# Patient Record
Sex: Female | Born: 1970 | Race: Black or African American | Hispanic: No | Smoking: Never smoker
Health system: Southern US, Community
[De-identification: ages and names within clinical notes are randomized; demographics above are authoritative.]

## PROBLEM LIST (undated history)

## (undated) DIAGNOSIS — F419 Anxiety disorder, unspecified: Secondary | ICD-10-CM

## (undated) DIAGNOSIS — J189 Pneumonia, unspecified organism: Secondary | ICD-10-CM

## (undated) DIAGNOSIS — M1990 Unspecified osteoarthritis, unspecified site: Secondary | ICD-10-CM

## (undated) DIAGNOSIS — G4733 Obstructive sleep apnea (adult) (pediatric): Secondary | ICD-10-CM

## (undated) DIAGNOSIS — B029 Zoster without complications: Secondary | ICD-10-CM

## (undated) DIAGNOSIS — S82891A Other fracture of right lower leg, initial encounter for closed fracture: Secondary | ICD-10-CM

## (undated) DIAGNOSIS — K469 Unspecified abdominal hernia without obstruction or gangrene: Secondary | ICD-10-CM

## (undated) DIAGNOSIS — D649 Anemia, unspecified: Secondary | ICD-10-CM

## (undated) DIAGNOSIS — IMO0001 Reserved for inherently not codable concepts without codable children: Secondary | ICD-10-CM

## (undated) DIAGNOSIS — Z950 Presence of cardiac pacemaker: Secondary | ICD-10-CM

## (undated) DIAGNOSIS — G5600 Carpal tunnel syndrome, unspecified upper limb: Secondary | ICD-10-CM

## (undated) DIAGNOSIS — M722 Plantar fascial fibromatosis: Secondary | ICD-10-CM

## (undated) DIAGNOSIS — E119 Type 2 diabetes mellitus without complications: Secondary | ICD-10-CM

## (undated) DIAGNOSIS — K219 Gastro-esophageal reflux disease without esophagitis: Secondary | ICD-10-CM

## (undated) DIAGNOSIS — I10 Essential (primary) hypertension: Secondary | ICD-10-CM

## (undated) HISTORY — PX: TUBAL LIGATION: SHX77

## (undated) HISTORY — PX: GASTRIC BYPASS: SHX52

## (undated) HISTORY — PX: LAPAROSCOPIC GASTRIC SLEEVE RESECTION: SHX5895

## (undated) HISTORY — DX: Essential (primary) hypertension: I10

## (undated) HISTORY — PX: CARPAL TUNNEL RELEASE: SHX101

---

## 1997-07-04 ENCOUNTER — Emergency Department (HOSPITAL_COMMUNITY): Admission: EM | Admit: 1997-07-04 | Discharge: 1997-07-04 | Payer: Self-pay | Admitting: Emergency Medicine

## 1997-07-05 ENCOUNTER — Emergency Department (HOSPITAL_COMMUNITY): Admission: EM | Admit: 1997-07-05 | Discharge: 1997-07-06 | Payer: Self-pay | Admitting: Emergency Medicine

## 1997-09-26 ENCOUNTER — Emergency Department (HOSPITAL_COMMUNITY): Admission: EM | Admit: 1997-09-26 | Discharge: 1997-09-26 | Payer: Self-pay | Admitting: Emergency Medicine

## 1997-11-26 ENCOUNTER — Emergency Department (HOSPITAL_COMMUNITY): Admission: EM | Admit: 1997-11-26 | Discharge: 1997-11-26 | Payer: Self-pay | Admitting: Emergency Medicine

## 1998-02-18 ENCOUNTER — Emergency Department (HOSPITAL_COMMUNITY): Admission: EM | Admit: 1998-02-18 | Discharge: 1998-02-18 | Payer: Self-pay | Admitting: Emergency Medicine

## 1998-05-06 ENCOUNTER — Emergency Department (HOSPITAL_COMMUNITY): Admission: EM | Admit: 1998-05-06 | Discharge: 1998-05-06 | Payer: Self-pay | Admitting: Internal Medicine

## 1998-07-25 ENCOUNTER — Encounter: Admission: RE | Admit: 1998-07-25 | Discharge: 1998-08-14 | Payer: Self-pay | Admitting: Family Medicine

## 1999-01-19 DIAGNOSIS — S82891A Other fracture of right lower leg, initial encounter for closed fracture: Secondary | ICD-10-CM

## 1999-01-19 HISTORY — DX: Other fracture of right lower leg, initial encounter for closed fracture: S82.891A

## 1999-10-07 ENCOUNTER — Emergency Department (HOSPITAL_COMMUNITY): Admission: EM | Admit: 1999-10-07 | Discharge: 1999-10-07 | Payer: Self-pay | Admitting: Emergency Medicine

## 1999-11-09 ENCOUNTER — Emergency Department (HOSPITAL_COMMUNITY): Admission: EM | Admit: 1999-11-09 | Discharge: 1999-11-09 | Payer: Self-pay | Admitting: Emergency Medicine

## 2000-04-11 ENCOUNTER — Encounter: Payer: Self-pay | Admitting: Internal Medicine

## 2000-04-11 ENCOUNTER — Emergency Department (HOSPITAL_COMMUNITY): Admission: EM | Admit: 2000-04-11 | Discharge: 2000-04-11 | Payer: Self-pay | Admitting: Emergency Medicine

## 2000-11-02 ENCOUNTER — Emergency Department (HOSPITAL_COMMUNITY): Admission: EM | Admit: 2000-11-02 | Discharge: 2000-11-02 | Payer: Self-pay | Admitting: Emergency Medicine

## 2000-11-02 ENCOUNTER — Encounter: Payer: Self-pay | Admitting: Emergency Medicine

## 2001-02-24 ENCOUNTER — Emergency Department (HOSPITAL_COMMUNITY): Admission: EM | Admit: 2001-02-24 | Discharge: 2001-02-25 | Payer: Self-pay | Admitting: Emergency Medicine

## 2001-02-25 ENCOUNTER — Encounter: Payer: Self-pay | Admitting: Emergency Medicine

## 2001-05-31 ENCOUNTER — Emergency Department (HOSPITAL_COMMUNITY): Admission: EM | Admit: 2001-05-31 | Discharge: 2001-06-01 | Payer: Self-pay | Admitting: *Deleted

## 2002-02-15 ENCOUNTER — Emergency Department (HOSPITAL_COMMUNITY): Admission: EM | Admit: 2002-02-15 | Discharge: 2002-02-15 | Payer: Self-pay | Admitting: Emergency Medicine

## 2002-07-30 ENCOUNTER — Emergency Department (HOSPITAL_COMMUNITY): Admission: EM | Admit: 2002-07-30 | Discharge: 2002-07-30 | Payer: Self-pay | Admitting: Emergency Medicine

## 2002-07-30 ENCOUNTER — Encounter: Payer: Self-pay | Admitting: Emergency Medicine

## 2002-10-15 ENCOUNTER — Emergency Department (HOSPITAL_COMMUNITY): Admission: EM | Admit: 2002-10-15 | Discharge: 2002-10-15 | Payer: Self-pay | Admitting: Emergency Medicine

## 2003-03-20 ENCOUNTER — Emergency Department (HOSPITAL_COMMUNITY): Admission: EM | Admit: 2003-03-20 | Discharge: 2003-03-21 | Payer: Self-pay | Admitting: Emergency Medicine

## 2003-03-25 ENCOUNTER — Emergency Department (HOSPITAL_COMMUNITY): Admission: EM | Admit: 2003-03-25 | Discharge: 2003-03-25 | Payer: Self-pay

## 2003-08-03 ENCOUNTER — Emergency Department (HOSPITAL_COMMUNITY): Admission: EM | Admit: 2003-08-03 | Discharge: 2003-08-03 | Payer: Self-pay | Admitting: *Deleted

## 2003-08-26 ENCOUNTER — Emergency Department (HOSPITAL_COMMUNITY): Admission: EM | Admit: 2003-08-26 | Discharge: 2003-08-26 | Payer: Self-pay | Admitting: Emergency Medicine

## 2003-09-16 ENCOUNTER — Emergency Department (HOSPITAL_COMMUNITY): Admission: EM | Admit: 2003-09-16 | Discharge: 2003-09-16 | Payer: Self-pay | Admitting: Emergency Medicine

## 2004-05-22 ENCOUNTER — Emergency Department (HOSPITAL_COMMUNITY): Admission: EM | Admit: 2004-05-22 | Discharge: 2004-05-22 | Payer: Self-pay | Admitting: Emergency Medicine

## 2004-10-16 ENCOUNTER — Emergency Department (HOSPITAL_COMMUNITY): Admission: EM | Admit: 2004-10-16 | Discharge: 2004-10-16 | Payer: Self-pay | Admitting: Emergency Medicine

## 2004-10-20 ENCOUNTER — Emergency Department (HOSPITAL_COMMUNITY): Admission: EM | Admit: 2004-10-20 | Discharge: 2004-10-20 | Payer: Self-pay | Admitting: Emergency Medicine

## 2004-11-24 ENCOUNTER — Emergency Department (HOSPITAL_COMMUNITY): Admission: EM | Admit: 2004-11-24 | Discharge: 2004-11-24 | Payer: Self-pay | Admitting: Emergency Medicine

## 2005-01-28 ENCOUNTER — Emergency Department (HOSPITAL_COMMUNITY): Admission: EM | Admit: 2005-01-28 | Discharge: 2005-01-28 | Payer: Self-pay | Admitting: Emergency Medicine

## 2005-02-17 ENCOUNTER — Emergency Department (HOSPITAL_COMMUNITY): Admission: EM | Admit: 2005-02-17 | Discharge: 2005-02-17 | Payer: Self-pay | Admitting: Emergency Medicine

## 2005-04-14 ENCOUNTER — Emergency Department (HOSPITAL_COMMUNITY): Admission: EM | Admit: 2005-04-14 | Discharge: 2005-04-15 | Payer: Self-pay | Admitting: Emergency Medicine

## 2005-08-30 ENCOUNTER — Emergency Department (HOSPITAL_COMMUNITY): Admission: EM | Admit: 2005-08-30 | Discharge: 2005-08-30 | Payer: Self-pay | Admitting: Emergency Medicine

## 2006-03-20 ENCOUNTER — Emergency Department (HOSPITAL_COMMUNITY): Admission: EM | Admit: 2006-03-20 | Discharge: 2006-03-20 | Payer: Self-pay | Admitting: Emergency Medicine

## 2006-07-01 ENCOUNTER — Emergency Department (HOSPITAL_COMMUNITY): Admission: EM | Admit: 2006-07-01 | Discharge: 2006-07-01 | Payer: Self-pay | Admitting: Emergency Medicine

## 2006-08-29 ENCOUNTER — Emergency Department (HOSPITAL_COMMUNITY): Admission: EM | Admit: 2006-08-29 | Discharge: 2006-08-29 | Payer: Self-pay | Admitting: Emergency Medicine

## 2007-02-08 ENCOUNTER — Emergency Department (HOSPITAL_COMMUNITY): Admission: EM | Admit: 2007-02-08 | Discharge: 2007-02-09 | Payer: Self-pay | Admitting: Emergency Medicine

## 2007-08-27 ENCOUNTER — Emergency Department (HOSPITAL_COMMUNITY): Admission: EM | Admit: 2007-08-27 | Discharge: 2007-08-27 | Payer: Self-pay | Admitting: Emergency Medicine

## 2007-10-22 ENCOUNTER — Emergency Department (HOSPITAL_COMMUNITY): Admission: EM | Admit: 2007-10-22 | Discharge: 2007-10-22 | Payer: Self-pay | Admitting: Emergency Medicine

## 2009-04-24 ENCOUNTER — Emergency Department (HOSPITAL_COMMUNITY): Admission: EM | Admit: 2009-04-24 | Discharge: 2009-04-24 | Payer: Self-pay | Admitting: Emergency Medicine

## 2009-07-14 ENCOUNTER — Emergency Department (HOSPITAL_COMMUNITY): Admission: EM | Admit: 2009-07-14 | Discharge: 2009-07-14 | Payer: Self-pay | Admitting: Emergency Medicine

## 2009-08-28 ENCOUNTER — Emergency Department (HOSPITAL_COMMUNITY): Admission: EM | Admit: 2009-08-28 | Discharge: 2009-08-28 | Payer: Self-pay | Admitting: Emergency Medicine

## 2009-08-29 ENCOUNTER — Emergency Department (HOSPITAL_COMMUNITY): Admission: EM | Admit: 2009-08-29 | Discharge: 2009-08-29 | Payer: Self-pay | Admitting: Emergency Medicine

## 2010-04-05 LAB — URINALYSIS, ROUTINE W REFLEX MICROSCOPIC
Glucose, UA: NEGATIVE mg/dL
Hgb urine dipstick: NEGATIVE
Ketones, ur: NEGATIVE mg/dL
Nitrite: NEGATIVE
Protein, ur: NEGATIVE mg/dL
pH: 6.5 (ref 5.0–8.0)

## 2010-04-05 LAB — POCT I-STAT, CHEM 8
Calcium, Ion: 1.13 mmol/L (ref 1.12–1.32)
Chloride: 109 mEq/L (ref 96–112)
HCT: 38 % (ref 36.0–46.0)
Hemoglobin: 12.9 g/dL (ref 12.0–15.0)
Sodium: 143 mEq/L (ref 135–145)

## 2010-04-05 LAB — POCT PREGNANCY, URINE: Preg Test, Ur: NEGATIVE

## 2010-04-05 LAB — GLUCOSE, CAPILLARY: Glucose-Capillary: 93 mg/dL (ref 70–99)

## 2010-04-08 LAB — POCT PREGNANCY, URINE: Preg Test, Ur: NEGATIVE

## 2011-05-29 ENCOUNTER — Emergency Department (HOSPITAL_COMMUNITY)
Admission: EM | Admit: 2011-05-29 | Discharge: 2011-05-29 | Disposition: A | Discharge status: Home / Self Care | Payer: Self-pay | Attending: Emergency Medicine | Admitting: Emergency Medicine

## 2011-05-29 ENCOUNTER — Encounter (HOSPITAL_COMMUNITY): Payer: Self-pay | Admitting: *Deleted

## 2011-05-29 DIAGNOSIS — L03113 Cellulitis of right upper limb: Secondary | ICD-10-CM

## 2011-05-29 DIAGNOSIS — Z79899 Other long term (current) drug therapy: Secondary | ICD-10-CM | POA: Insufficient documentation

## 2011-05-29 DIAGNOSIS — IMO0002 Reserved for concepts with insufficient information to code with codable children: Secondary | ICD-10-CM | POA: Insufficient documentation

## 2011-05-29 DIAGNOSIS — R51 Headache: Secondary | ICD-10-CM | POA: Insufficient documentation

## 2011-05-29 DIAGNOSIS — J45909 Unspecified asthma, uncomplicated: Secondary | ICD-10-CM | POA: Insufficient documentation

## 2011-05-29 MED ORDER — SULFAMETHOXAZOLE-TRIMETHOPRIM 800-160 MG PO TABS: 1.0000 | ORAL_TABLET | Freq: Two times a day (BID) | ORAL | Status: AC

## 2011-05-29 MED ORDER — SODIUM CHLORIDE 0.9 % IV BOLUS (SEPSIS)
1000.0000 mL | Freq: Once | INTRAVENOUS | Status: AC
  Administered 2011-05-29: 1000 mL via INTRAVENOUS

## 2011-05-29 MED ORDER — VANCOMYCIN HCL IN DEXTROSE 1-5 GM/200ML-% IV SOLN
1000.0000 mg | Freq: Once | INTRAVENOUS | Status: AC
  Administered 2011-05-29: 1000 mg via INTRAVENOUS
  Filled 2011-05-29: qty 200

## 2011-05-29 MED ORDER — IBUPROFEN 200 MG PO TABS: 400.0000 mg | ORAL_TABLET | Freq: Once | ORAL | Status: DC

## 2011-05-29 MED ORDER — ACETAMINOPHEN-CODEINE #3 300-30 MG PO TABS
1.0000 | ORAL_TABLET | Freq: Once | ORAL | Status: AC
  Administered 2011-05-29: 1 via ORAL
  Filled 2011-05-29: qty 1

## 2011-05-29 MED ORDER — ACETAMINOPHEN-CODEINE #3 300-30 MG PO TABS: 1.0000 | ORAL_TABLET | Freq: Four times a day (QID) | ORAL | Status: AC | PRN

## 2011-05-29 NOTE — ED Notes (Signed)
1st set of blood cultures drawn from left arm and held.

## 2011-05-29 NOTE — ED Notes (Signed)
Erythematous area to R forearm marked to assess for changes. Vancomycin running. Family at [facility]. Med given for headache.

## 2011-05-29 NOTE — ED Notes (Signed)
Red inflamed area to rt arm after insect bite on Wed, has small fluid filled blisters around area. Pt also states she has a headache started on Thursday am

## 2011-05-29 NOTE — Discharge Instructions (Signed)
Cellulitis Cellulitis is an infection of the skin and the tissue beneath it. The area is typically red and tender. It is caused by germs (bacteria) (usually staph or strep) that enter the body through cuts or sores. Cellulitis most commonly occurs in the arms or lower legs.  HOME CARE INSTRUCTIONS   If you are given a prescription for medications which kill germs (antibiotics), take as directed until finished.   If the infection is on the arm or leg, keep the limb elevated as able.   Use a warm cloth several times per day to relieve pain and encourage healing.   See your caregiver for recheck of the infected site as directed if problems arise.   Only take over-the-counter or prescription medicines for pain, discomfort, or fever as directed by your caregiver.  SEEK MEDICAL CARE IF:   The area of redness (inflammation) is spreading, there are red streaks coming from the infected site, or if a part of the infection begins to turn dark in color.   The joint or bone underneath the infected skin becomes painful after the skin has healed.   The infection returns in the same or another area after it seems to have gone away.   A boil or bump swells up. This may be an abscess.   New, unexplained problems such as pain or fever develop.  SEEK IMMEDIATE MEDICAL CARE IF:   You have a fever.   You or your child feels drowsy or lethargic.   There is vomiting, diarrhea, or lasting discomfort or feeling ill (malaise) with muscle aches and pains.  MAKE SURE YOU:   Understand these instructions.   Will watch your condition.   Will get help right away if you are not doing well or get worse.  Document Released: 10/14/2004 Document Revised: 12/24/2010 Document Reviewed: 08/23/2007 [facility] Patient Information 2012 [place], [place].  RESOURCE GUIDE  Dental Problems  Patients with Medicaid: [facility] [phone]                                            [phone] [address] Phone:  [phone]                                                  Phone:  [phone]  If unable to pay or uninsured, contact:  [facility] or [facility]. to become qualified for the adult dental clinic.  Chronic Pain Problems Contact [facility]  [phone] Patients need to be referred by their primary care doctor.  Insufficient Money for Medicine Contact [employer]:  call "211" or [facility] [phone].  No Primary Care Doctor Call [facility]  [phone] Other agencies that provide inexpensive medical care    [facility]  [phone]    [facility]  [phone]    [facility]  [phone]    [facility]  [phone]    [facility]  [phone]    [facility]  [phone]  Psychological Services [facility]  [phone] [facility]  [phone]  [facility]   [phone] (emergency services [phone])  Substance Abuse Resources [facility]  [phone] [facility] [phone] [facility] [place] [phone] Floydene Flock [phone] Residential & Outpatient Substance Abuse Program  [phone]  Abuse/Neglect [facility] Child Abuse Hotline 207-120-8666 [facility] Child Abuse Hotline [phone] (After Hours)  Emergency Shelter [facility] [phone]  Maternity Homes [facility] at the [place] [facility] [phone] [facility] [phone]  MRSA Hotline #:   7051876910    [facility] Resources  [facility]     [employer]                          [facility] Dept. [address] [place]                       [address]      [address]  Blondell Reveal Phone:  [phone]                                    Phone:  [phone]                 Phone:  [phone]  [facility] Phone:  [phone]  [facility] Child Abuse Hotline 9892853893 [phone] (After Hours)

## 2011-06-02 NOTE — ED Provider Notes (Signed)
History    Female with right upper extremity pain. Patient reports that she's been by an insect on Wednesday. She does not actually remember seeing an insect though, she is just presuming this. Increasing redness since. No fevers or chills. No nausea or vomiting. Denies history of diabetes. Denies lesions anywhere else. No contacts similar symptoms. No history similar symptoms. No new exposures that she is aware of. No difficulty breathing or swallowing. CSN: [id number]  Arrival date & time 05/29/11  1209   First MD Initiated Contact with Patient 05/29/11 1247      Chief Complaint  Patient presents with  . Insect Bite  . Headache    (Consider location/radiation/quality/duration/timing/severity/associated sxs/prior treatment) HPI  Past Medical History  Diagnosis Date  . Migraine   . Asthma     Past Surgical History  Procedure Date  . Tubal ligation     No family history on file.  History  Substance Use Topics  . Smoking status: Former Games developer  . Smokeless tobacco: Not on file  . Alcohol Use: No    OB History    Grav Para Term Preterm Abortions TAB SAB Ect Mult Living                  Review of Systems   Review of symptoms negative unless otherwise noted in HPI.   Allergies  Aspirin; Penicillins; Percocet; and Vicodin  Home Medications   Current Outpatient Rx  Name Route Sig Dispense Refill  . ACETAMINOPHEN 500 MG PO TABS Oral Take 1,000 mg by mouth every 6 (six) hours as needed. pain    . ALBUTEROL SULFATE HFA 108 (90 BASE) MCG/ACT IN AERS Inhalation Inhale 2 puffs into the lungs every 6 (six) hours as needed. Shortness of breath    . ACETAMINOPHEN-CODEINE #3 300-30 MG PO TABS Oral Take 1-2 tablets by mouth every 6 (six) hours as needed for pain. 15 tablet 0  . SULFAMETHOXAZOLE-TRIMETHOPRIM 800-160 MG PO TABS Oral Take 1 tablet by mouth every 12 (twelve) hours. 14 tablet 0    BP 138/81  Pulse 59  Temp(Src) 98.3 F (36.8 C) (Oral)  Resp 18  SpO2 100%   LMP 05/09/2011  Physical Exam  Nursing note and vitals reviewed. Constitutional: She appears well-developed and well-nourished. No distress.  HENT:  Head: Normocephalic and atraumatic.  Eyes: Conjunctivae are normal. Right eye exhibits no discharge. Left eye exhibits no discharge.  Neck: Neck supple.  Cardiovascular: Normal rate, regular rhythm and normal heart sounds.  Exam reveals no gallop and no friction rub.   No murmur heard. Pulmonary/Chest: Effort normal and breath sounds normal. No respiratory distress.  Abdominal: Soft. She exhibits no distension. There is no tenderness.  Musculoskeletal: She exhibits no edema and no tenderness.  Neurological: She is alert.  Skin: Skin is warm and dry.       Medial aspect of the proximal right forearm near the elbow has a erythematous patch w/ increased warmth and tenderness to palpation. Two small clear fluid-filled vesicles near central aspect. No induration. No drainage. FROM elbow without increase of pain. No effusion. No bony tenderness.  Psychiatric: She has a normal mood and affect. Her behavior is normal. Thought content normal.    ED Course  Procedures (including critical care time)  Labs Reviewed - No data to display No results found.   1. Cellulitis of right arm       MDM  41 year old female with cellulitis her right upper extremity. She is afebrile and well appearing.  Patient reports a history of bug bite, although she did not actually see an insect. Suspect cellulitis although there are small clearish fluid-filled vesicles in the middle of the erythematous area. Consider other etiology or contact dermatitis. There is no history to suggest a exposure though. Treat for possible cellulitis. Feel that patient can be treated as an outpatient this time. Return precautions were discussed. Return in 2-3 days for recheck.     Raeford Razor, MD 06/02/11 [phone]

## 2011-12-22 ENCOUNTER — Encounter (HOSPITAL_COMMUNITY): Payer: Self-pay | Admitting: Emergency Medicine

## 2011-12-22 ENCOUNTER — Emergency Department (HOSPITAL_COMMUNITY)
Admission: EM | Admit: 2011-12-22 | Discharge: 2011-12-23 | Disposition: A | Discharge status: Home / Self Care | Payer: Self-pay | Attending: Emergency Medicine | Admitting: Emergency Medicine

## 2011-12-22 DIAGNOSIS — J029 Acute pharyngitis, unspecified: Secondary | ICD-10-CM | POA: Insufficient documentation

## 2011-12-22 DIAGNOSIS — J45909 Unspecified asthma, uncomplicated: Secondary | ICD-10-CM | POA: Insufficient documentation

## 2011-12-22 DIAGNOSIS — R059 Cough, unspecified: Secondary | ICD-10-CM | POA: Insufficient documentation

## 2011-12-22 DIAGNOSIS — Z8679 Personal history of other diseases of the circulatory system: Secondary | ICD-10-CM | POA: Insufficient documentation

## 2011-12-22 DIAGNOSIS — J3489 Other specified disorders of nose and nasal sinuses: Secondary | ICD-10-CM | POA: Insufficient documentation

## 2011-12-22 DIAGNOSIS — Z87891 Personal history of nicotine dependence: Secondary | ICD-10-CM | POA: Insufficient documentation

## 2011-12-22 DIAGNOSIS — J069 Acute upper respiratory infection, unspecified: Secondary | ICD-10-CM | POA: Insufficient documentation

## 2011-12-22 DIAGNOSIS — Z79899 Other long term (current) drug therapy: Secondary | ICD-10-CM | POA: Insufficient documentation

## 2011-12-22 DIAGNOSIS — R05 Cough: Secondary | ICD-10-CM | POA: Insufficient documentation

## 2011-12-22 NOTE — ED Notes (Signed)
Patient states that for that last 2 days she has had a HA, cough with nasal congestion, runny nose. The patient reports that she feels miserable

## 2011-12-23 ENCOUNTER — Emergency Department (HOSPITAL_COMMUNITY): Payer: Self-pay

## 2011-12-23 MED ORDER — AZITHROMYCIN 250 MG PO TABS: 250.0000 mg | ORAL_TABLET | Freq: Every day | ORAL | Status: DC

## 2011-12-23 MED ORDER — PREDNISONE 20 MG PO TABS
60.0000 mg | ORAL_TABLET | Freq: Once | ORAL | Status: AC
  Administered 2011-12-23: 60 mg via ORAL
  Filled 2011-12-23: qty 3

## 2011-12-23 MED ORDER — PREDNISONE 20 MG PO TABS: 40.0000 mg | ORAL_TABLET | Freq: Every day | ORAL | Status: DC

## 2011-12-23 MED ORDER — ALBUTEROL SULFATE (5 MG/ML) 0.5% IN NEBU
2.5000 mg | INHALATION_SOLUTION | Freq: Once | RESPIRATORY_TRACT | Status: AC
  Administered 2011-12-23: 2.5 mg via RESPIRATORY_TRACT
  Filled 2011-12-23 (×2): qty 0.5

## 2011-12-23 MED ORDER — BENZONATATE 100 MG PO CAPS: 200.0000 mg | ORAL_CAPSULE | Freq: Two times a day (BID) | ORAL | Status: DC | PRN

## 2011-12-23 MED ORDER — ALBUTEROL SULFATE HFA 108 (90 BASE) MCG/ACT IN AERS: 2.0000 | INHALATION_SPRAY | RESPIRATORY_TRACT | Status: DC | PRN

## 2011-12-23 NOTE — ED Provider Notes (Signed)
History     CSN: [id number]  Arrival date & time 12/22/11  2333   First MD Initiated Contact with Patient 12/22/11 2359      Chief Complaint  Patient presents with  . Generalized Body Aches  . Cough  . Nasal Congestion    (Consider location/radiation/quality/duration/timing/severity/associated sxs/prior treatment) HPI Comments: This is a 41 year old female who presents emergency department with chief complaint of headache, cough, nasal congestion, sore throat, and diagnosed for the past 2 days. She has not taken anything to alleviate her symptoms. She has past medical history remarkable for asthma. She states that her symptoms have been worsening. Nothing makes her symptoms worse or better. She is in moderate discomfort. She reports subjective fever.   The history is provided by the patient. No language interpreter was used.    Past Medical History  Diagnosis Date  . Migraine   . Asthma     Past Surgical History  Procedure Date  . Tubal ligation     History reviewed. No pertinent family history.  History  Substance Use Topics  . Smoking status: Former Games developer  . Smokeless tobacco: Not on file  . Alcohol Use: No    OB History    Grav Para Term Preterm Abortions TAB SAB Ect Mult Living                  Review of Systems  All other systems reviewed and are negative.    Allergies  Aspirin; Penicillins; Percocet; and Vicodin  Home Medications   Current Outpatient Rx  Name  Route  Sig  Dispense  Refill  . ACETAMINOPHEN 500 MG PO TABS   Oral   Take 1,000 mg by mouth every 6 (six) hours as needed. pain         . ALBUTEROL SULFATE HFA 108 (90 BASE) MCG/ACT IN AERS   Inhalation   Inhale 2 puffs into the lungs every 6 (six) hours as needed. Shortness of breath         . ALBUTEROL SULFATE HFA 108 (90 BASE) MCG/ACT IN AERS   Inhalation   Inhale 2 puffs into the lungs every 4 (four) hours as needed for wheezing or shortness of breath.   1 Inhaler   3   .  AZITHROMYCIN 250 MG PO TABS   Oral   Take 1 tablet (250 mg total) by mouth daily. 500mg  PO day 1, then 250mg  PO days 205   6 tablet   0   . PREDNISONE 20 MG PO TABS   Oral   Take 2 tablets (40 mg total) by mouth daily.   10 tablet   0     BP 143/98  Pulse 95  Temp 99.8 F (37.7 C) (Oral)  Resp 18  SpO2 100%  LMP 10/19/2011  Physical Exam  Nursing note and vitals reviewed. Constitutional: She is oriented to person, place, and time. She appears well-developed and well-nourished.  HENT:  Head: Normocephalic and atraumatic.       Swollen boggy nasal turbinates, oropharynx red and inflamed.  Eyes: Conjunctivae normal and EOM are normal. Pupils are equal, round, and reactive to light.  Neck: Normal range of motion. Neck supple.  Cardiovascular: Normal rate and regular rhythm.  Exam reveals no gallop and no friction rub.   No murmur heard. Pulmonary/Chest: Effort normal and breath sounds normal. No respiratory distress. She has no wheezes. She has no rales. She exhibits no tenderness.  Abdominal: Soft. Bowel sounds are normal. She exhibits no  distension and no mass. There is no tenderness. There is no rebound and no guarding.  Musculoskeletal: Normal range of motion. She exhibits no edema and no tenderness.  Neurological: She is alert and oriented to person, place, and time.  Skin: Skin is warm and dry.  Psychiatric: She has a normal mood and affect. Her behavior is normal. Judgment and thought content normal.    ED Course  Procedures (including critical care time)  Labs Reviewed - No data to display Dg Chest 2 View  12/23/2011  *RADIOLOGY REPORT*  Clinical Data: Fever. Cough.  Flu symptoms.  CHEST - 2 VIEW  Comparison:  None.  Findings:  The heart size and mediastinal contours are within normal limits.  Both lungs are clear.  The visualized skeletal structures are unremarkable.  IMPRESSION: No active cardiopulmonary disease.   Original Report Authenticated By: Myles Rosenthal, M.D.       1. URI (upper respiratory infection)   2. Asthma       MDM  41 year old with cold and flulike symptoms. I'm going to give the patient was an inhaler, prednisone, and a Z-Pak the to her asthma history. Will also give tessalon perles.  Patient states that her chest hurts when she coughs.  She is tender in her intercostals on palpation.  I recommended ibuprofen as she has likely strained her muscles from coughing. Patient is stable and agrees with the plan. She is stable and ready for discharge. She is improved after breathing treatment in the ED.        Roxy Horseman, PA-C 12/23/11 [phone]

## 2011-12-24 NOTE — ED Provider Notes (Signed)
Medical screening examination/treatment/procedure(s) were performed by non-physician practitioner and as supervising physician I was immediately available for consultation/collaboration.  Aureliano Oshields, MD 12/24/11 0731 

## 2011-12-26 ENCOUNTER — Emergency Department (HOSPITAL_COMMUNITY)
Admission: EM | Admit: 2011-12-26 | Discharge: 2011-12-26 | Disposition: A | Discharge status: Home / Self Care | Payer: Self-pay | Attending: Emergency Medicine | Admitting: Emergency Medicine

## 2011-12-26 ENCOUNTER — Encounter (HOSPITAL_COMMUNITY): Payer: Self-pay

## 2011-12-26 ENCOUNTER — Emergency Department (HOSPITAL_COMMUNITY): Payer: Self-pay

## 2011-12-26 DIAGNOSIS — J111 Influenza due to unidentified influenza virus with other respiratory manifestations: Secondary | ICD-10-CM | POA: Insufficient documentation

## 2011-12-26 DIAGNOSIS — IMO0002 Reserved for concepts with insufficient information to code with codable children: Secondary | ICD-10-CM | POA: Insufficient documentation

## 2011-12-26 DIAGNOSIS — R05 Cough: Secondary | ICD-10-CM | POA: Insufficient documentation

## 2011-12-26 DIAGNOSIS — R059 Cough, unspecified: Secondary | ICD-10-CM | POA: Insufficient documentation

## 2011-12-26 DIAGNOSIS — R0682 Tachypnea, not elsewhere classified: Secondary | ICD-10-CM | POA: Insufficient documentation

## 2011-12-26 DIAGNOSIS — Z792 Long term (current) use of antibiotics: Secondary | ICD-10-CM | POA: Insufficient documentation

## 2011-12-26 DIAGNOSIS — Z87891 Personal history of nicotine dependence: Secondary | ICD-10-CM | POA: Insufficient documentation

## 2011-12-26 DIAGNOSIS — J45909 Unspecified asthma, uncomplicated: Secondary | ICD-10-CM | POA: Insufficient documentation

## 2011-12-26 DIAGNOSIS — Z8669 Personal history of other diseases of the nervous system and sense organs: Secondary | ICD-10-CM | POA: Insufficient documentation

## 2011-12-26 DIAGNOSIS — R110 Nausea: Secondary | ICD-10-CM | POA: Insufficient documentation

## 2011-12-26 DIAGNOSIS — R0609 Other forms of dyspnea: Secondary | ICD-10-CM | POA: Insufficient documentation

## 2011-12-26 DIAGNOSIS — R51 Headache: Secondary | ICD-10-CM | POA: Insufficient documentation

## 2011-12-26 DIAGNOSIS — R0989 Other specified symptoms and signs involving the circulatory and respiratory systems: Secondary | ICD-10-CM | POA: Insufficient documentation

## 2011-12-26 DIAGNOSIS — R0789 Other chest pain: Secondary | ICD-10-CM | POA: Insufficient documentation

## 2011-12-26 LAB — BASIC METABOLIC PANEL
Creatinine, Ser: 1.04 mg/dL (ref 0.50–1.10)
GFR calc Af Amer: 75 mL/min — ABNORMAL LOW (ref >90)
GFR calc non Af Amer: 65 mL/min — ABNORMAL LOW (ref >90)
Sodium: 136 mEq/L (ref 135–145)

## 2011-12-26 LAB — CBC
HCT: 35 % — ABNORMAL LOW (ref 36.0–46.0)
MCH: 27.3 pg (ref 26.0–34.0)
Platelets: 265 10*3/uL (ref 150–400)
RDW: 14.7 % (ref 11.5–15.5)
WBC: 5.2 10*3/uL (ref 4.0–10.5)

## 2011-12-26 MED ORDER — ACETAMINOPHEN 500 MG PO TABS
ORAL_TABLET | ORAL | Status: AC
  Administered 2011-12-26: 1000 mg via ORAL
  Filled 2011-12-26: qty 2

## 2011-12-26 MED ORDER — HYDROMORPHONE HCL PF 1 MG/ML IJ SOLN
0.5000 mg | Freq: Once | INTRAMUSCULAR | Status: AC
Start: 2011-12-26 — End: 2011-12-26
  Administered 2011-12-26: 0.5 mg via INTRAMUSCULAR
  Filled 2011-12-26: qty 1

## 2011-12-26 MED ORDER — OXYCODONE-ACETAMINOPHEN 5-325 MG PO TABS
1.0000 | ORAL_TABLET | Freq: Once | ORAL | Status: DC
  Filled 2011-12-26: qty 1

## 2011-12-26 MED ORDER — ALBUTEROL SULFATE (5 MG/ML) 0.5% IN NEBU
5.0000 mg | INHALATION_SOLUTION | Freq: Once | RESPIRATORY_TRACT | Status: AC
  Administered 2011-12-26: 5 mg via RESPIRATORY_TRACT
  Filled 2011-12-26: qty 1

## 2011-12-26 MED ORDER — DIPHENHYDRAMINE HCL 25 MG PO CAPS
25.0000 mg | ORAL_CAPSULE | Freq: Once | ORAL | Status: AC
  Administered 2011-12-26: 25 mg via ORAL
  Filled 2011-12-26: qty 1

## 2011-12-26 MED ORDER — ACETAMINOPHEN 500 MG PO TABS
1000.0000 mg | ORAL_TABLET | Freq: Once | ORAL | Status: AC
  Administered 2011-12-26: 1000 mg via ORAL

## 2011-12-26 MED ORDER — ACETAMINOPHEN 500 MG PO TABS
1000.0000 mg | ORAL_TABLET | Freq: Once | ORAL | Status: AC
  Administered 2011-12-26: 1000 mg via ORAL
  Filled 2011-12-26: qty 2

## 2011-12-26 MED ORDER — PREDNISONE 20 MG PO TABS
60.0000 mg | ORAL_TABLET | ORAL | Status: AC
  Administered 2011-12-26: 60 mg via ORAL
  Filled 2011-12-26: qty 3

## 2011-12-26 NOTE — ED Provider Notes (Signed)
History     CSN: [id number]  Arrival date & time 12/26/11  0545   First MD Initiated Contact with Patient 12/26/11 [phone]      Chief Complaint  Patient presents with  . Fever  . Shortness of Breath     HPI   The patient presents with dyspnea generalized discomfort, chest pain.  Symptoms began approximately one week ago, with dyspnea.  Since onset has been persistent.  Over that time she has developed chills, diffuse discomfort.  She also endorses nausea.  She denies vomiting or diarrhea.  She was seen here one week ago, discharged after symptomatic improvement with albuterol, steroids.  She notes that in the interim she has called 911 once, but has had no further ED evaluations.  Today she states that she has worsening generalized discomfort, persistent dyspnea.  Past Medical History  Diagnosis Date  . Migraine   . Asthma     Past Surgical History  Procedure Date  . Tubal ligation     History reviewed. No pertinent family history.  History  Substance Use Topics  . Smoking status: Former Games developer  . Smokeless tobacco: Not on file  . Alcohol Use: No    OB History    Grav Para Term Preterm Abortions TAB SAB Ect Mult Living                  Review of Systems  Constitutional:       Per HPI, otherwise negative  HENT:       Per HPI, otherwise negative  Eyes: Negative.   Respiratory:       Per HPI, otherwise negative  Cardiovascular:       Per HPI, otherwise negative  Gastrointestinal: Negative for vomiting.  Genitourinary: Negative.   Musculoskeletal:       Per HPI, otherwise negative  Skin: Negative.   Neurological: Negative for syncope.    Allergies  Aspirin; Penicillins; Percocet; and Vicodin  Home Medications   Current Outpatient Rx  Name  Route  Sig  Dispense  Refill  . ACETAMINOPHEN 500 MG PO TABS   Oral   Take 1,000 mg by mouth every 6 (six) hours as needed. pain         . ALBUTEROL SULFATE HFA 108 (90 BASE) MCG/ACT IN AERS   Inhalation  Inhale 2 puffs into the lungs every 6 (six) hours as needed. Shortness of breath         . AZITHROMYCIN 250 MG PO TABS   Oral   Take 1 tablet (250 mg total) by mouth daily. 500mg  PO day 1, then 250mg  PO days 205   6 tablet   0   . BENZONATATE 100 MG PO CAPS   Oral   Take 2 capsules (200 mg total) by mouth 2 (two) times daily as needed for cough.   20 capsule   0   . PREDNISONE 20 MG PO TABS   Oral   Take 2 tablets (40 mg total) by mouth daily.   10 tablet   0     BP 137/78  Pulse 88  Temp 100.6 F (38.1 C) (Oral)  Resp 21  SpO2 98%  LMP 10/19/2011  Physical Exam  Nursing note and vitals reviewed. Constitutional: She is oriented to person, place, and time. She appears well-developed and well-nourished. No distress.  HENT:  Head: Normocephalic and atraumatic.  Eyes: Conjunctivae normal and EOM are normal.  Cardiovascular: Normal rate and regular rhythm.   Pulmonary/Chest: Breath sounds normal.  No stridor. Tachypnea noted. No respiratory distress.       No sig wheezing throughout  Abdominal: She exhibits no distension.  Musculoskeletal: She exhibits no edema.  Neurological: She is alert and oriented to person, place, and time. No cranial nerve deficit.  Skin: Skin is warm and dry.  Psychiatric: She has a normal mood and affect.    ED Course  Procedures (including critical care time)   Labs Reviewed  CBC  BASIC METABOLIC PANEL   No results found.   No diagnosis found.  7:27 AM Patient's temp is down to 100.  9:20 AM Patient w persistent HA.  Otherwise she states that she feels better.  10:54 AM Patient feeling better.   I interpreted the XR.  No obvious infiltrate.   Date: 12/26/2011  Rate: 86  Rhythm: normal sinus rhythm  QRS Axis: normal  Intervals: normal  ST/T Wave abnormalities: normal  Conduction Disutrbances: none  Narrative Interpretation: unremarkable     MDM  This pleasant 41 year old female presents with concerns of ongoing  dyspnea, fever.  Notably, since the patient's recent evaluation she developed a cough, worsening headache.  On exam the patient is in neurologically unremarkable.  Following Tylenol, the patient's headache improved, fever ceased.  The patient's vital signs remained stable throughout her ED stay.  We discussed, at length, the likely diagnosis of viral syndrome, influenza-like illness.  Return precautions were provided.  Resources for finding a primary care physician for followup were provided.      Gerhard Munch, MD 12/26/11 1056

## 2011-12-26 NOTE — ED Notes (Signed)
Per pt, seen 12/5 for URI.  Called 911 last pm for difficulty breathing and rec'd breathing treatment.  Breathing has not improved.  Pt now presents with fever, nausea, cough- productive, body aches.

## 2011-12-26 NOTE — ED Notes (Signed)
Pt states that she started having a cough, SOB and fever on Monday. Went to physician on 12/4 and was given an inhaler, cough medication, and abx that she is still taking. Pt has hx of asthma. Says that she is having generalized bodyaches, and pain in her chest, abdomen and head from coughing.

## 2013-04-13 ENCOUNTER — Emergency Department (HOSPITAL_COMMUNITY)
Admission: EM | Admit: 2013-04-13 | Discharge: 2013-04-13 | Disposition: A | Discharge status: Home / Self Care | Payer: Medicaid Other | Attending: Emergency Medicine | Admitting: Emergency Medicine

## 2013-04-13 ENCOUNTER — Emergency Department (HOSPITAL_COMMUNITY): Payer: Medicaid Other

## 2013-04-13 ENCOUNTER — Encounter (HOSPITAL_COMMUNITY): Payer: Self-pay | Admitting: Emergency Medicine

## 2013-04-13 DIAGNOSIS — Z792 Long term (current) use of antibiotics: Secondary | ICD-10-CM | POA: Insufficient documentation

## 2013-04-13 DIAGNOSIS — J45909 Unspecified asthma, uncomplicated: Secondary | ICD-10-CM | POA: Insufficient documentation

## 2013-04-13 DIAGNOSIS — M7730 Calcaneal spur, unspecified foot: Secondary | ICD-10-CM | POA: Insufficient documentation

## 2013-04-13 DIAGNOSIS — M25571 Pain in right ankle and joints of right foot: Secondary | ICD-10-CM

## 2013-04-13 DIAGNOSIS — M19079 Primary osteoarthritis, unspecified ankle and foot: Secondary | ICD-10-CM | POA: Insufficient documentation

## 2013-04-13 DIAGNOSIS — Z79899 Other long term (current) drug therapy: Secondary | ICD-10-CM | POA: Insufficient documentation

## 2013-04-13 DIAGNOSIS — Z87891 Personal history of nicotine dependence: Secondary | ICD-10-CM | POA: Insufficient documentation

## 2013-04-13 DIAGNOSIS — Z880 Allergy status to penicillin: Secondary | ICD-10-CM | POA: Insufficient documentation

## 2013-04-13 DIAGNOSIS — IMO0002 Reserved for concepts with insufficient information to code with codable children: Secondary | ICD-10-CM | POA: Insufficient documentation

## 2013-04-13 DIAGNOSIS — Z8679 Personal history of other diseases of the circulatory system: Secondary | ICD-10-CM | POA: Insufficient documentation

## 2013-04-13 DIAGNOSIS — M1990 Unspecified osteoarthritis, unspecified site: Secondary | ICD-10-CM

## 2013-04-13 MED ORDER — MELOXICAM 15 MG PO TABS: 15.0000 mg | ORAL_TABLET | Freq: Every day | ORAL | Status: DC

## 2013-04-13 MED ORDER — TRAMADOL HCL 50 MG PO TABS: 50.0000 mg | ORAL_TABLET | Freq: Four times a day (QID) | ORAL | Status: DC | PRN

## 2013-04-13 NOTE — Discharge Instructions (Signed)
Your x-rays do not show any broken bones or other concerning injuries.  You do have arthritis changes as well as bone spurs which can lead to pain and inflammation.  Use Rest, Ice, Compression, and Elevation to reduce pain and swelling.  Follow up with a primary care provider or orthopedic specialist for continued evaluation and treatment.    Ankle Pain Ankle pain is a common symptom. The bones, cartilage, tendons, and muscles of the ankle joint perform a lot of work each day. The ankle joint holds your body weight and allows you to move around. Ankle pain can occur on either side or back of 1 or both ankles. Ankle pain may be sharp and burning or dull and aching. There may be tenderness, stiffness, redness, or warmth around the ankle. The pain occurs more often when a person walks or puts pressure on the ankle. CAUSES  There are many reasons ankle pain can develop. It is important to work with your caregiver to identify the cause since many conditions can impact the bones, cartilage, muscles, and tendons. Causes for ankle pain include:  Injury, including a break (fracture), sprain, or strain often due to a fall, sports, or a high-impact activity.  Swelling (inflammation) of a tendon (tendonitis).  Achilles tendon rupture.  Ankle instability after repeated sprains and strains.  Poor foot alignment.  Pressure on a nerve (tarsal tunnel syndrome).  Arthritis in the ankle or the lining of the ankle.  Crystal formation in the ankle (gout or pseudogout). DIAGNOSIS  A diagnosis is based on your medical history, your symptoms, results of your physical exam, and results of diagnostic tests. Diagnostic tests may include X-ray exams or a computerized magnetic scan (magnetic resonance imaging, MRI). TREATMENT  Treatment will depend on the cause of your ankle pain and may include:  Keeping pressure off the ankle and limiting activities.  Using crutches or other walking support (a cane or  brace).  Using rest, ice, compression, and elevation.  Participating in physical therapy or home exercises.  Wearing shoe inserts or special shoes.  Losing weight.  Taking medications to reduce pain or swelling or receiving an injection.  Undergoing surgery. HOME CARE INSTRUCTIONS   Only take over-the-counter or prescription medicines for pain, discomfort, or fever as directed by your caregiver.  Put ice on the injured area.  Put ice in a plastic bag.  Place a towel between your skin and the bag.  Leave the ice on for 15-20 minutes at a time, 03-04 times a day.  Keep your leg raised (elevated) when possible to lessen swelling.  Avoid activities that cause ankle pain.  Follow specific exercises as directed by your caregiver.  Record how often you have ankle pain, the location of the pain, and what it feels like. This information may be helpful to you and your caregiver.  Ask your caregiver about returning to work or sports and whether you should drive.  Follow up with your caregiver for further examination, therapy, or testing as directed. SEEK MEDICAL CARE IF:   Pain or swelling continues or worsens beyond 1 week.  You have an oral temperature above 102 F (38.9 C).  You are feeling unwell or have chills.  You are having an increasingly difficult time with walking.  You have loss of sensation or other new symptoms.  You have questions or concerns. MAKE SURE YOU:   Understand these instructions.  Will watch your condition.  Will get help right away if you are not doing well  or get worse. Document Released: 06/24/2009 Document Revised: 03/29/2011 Document Reviewed: 06/24/2009 [facility] Patient Information 2014 [place], [place].

## 2013-04-13 NOTE — ED Notes (Signed)
Pt states she broke her R ankle a few years ago and it didn't heal properly. States she has had swelling and pain to R ankle for the past 2 weeks. Pt also c/o pain to L heel area with pins and needles sensation. Pt reports that she has used ice, heat, compression and Tylenol with little relief. Pt ambulatory to exam room with steady gait.

## 2013-04-13 NOTE — ED Provider Notes (Signed)
CSN: [id number]     Arrival date & time 04/13/13  1913 History   First MD Initiated Contact with Patient 04/13/13 2007     Chief Complaint  Patient presents with  . Ankle Pain   HPI  Hx provided by the pt.  Pt is a 43 yo female with hx of asthma and migraines who presents with complaints of worsening rt ankle pain and swelling.  Pt reports chronic hx of rt ankle pain following fractures over 14 yrs ago.  She states they never "healed back right".  She was told by an orthopedic doctor that she would need to "have her bones re-broken and set" so they could heal correctly.  Over the past 2 week she has had increased pain and swelling.  She has used rest, ice, and elevation.  She has used an ace wrap as well as Tylenol and aleve without any improvement.  Denies any new injury or trauma.  No weakness or numbness in foot or toes.  No swelling of calf.  No chest pain or SOB.     Past Medical History  Diagnosis Date  . Migraine   . Asthma    Past Surgical History  Procedure Laterality Date  . Tubal ligation     No family history on file. History  Substance Use Topics  . Smoking status: Former Games developer  . Smokeless tobacco: Not on file  . Alcohol Use: No   OB History   Grav Para Term Preterm Abortions TAB SAB Ect Mult Living                 Review of Systems  Constitutional: Negative for fever, chills and diaphoresis.  Respiratory: Negative for shortness of breath.   Cardiovascular: Negative for chest pain.  Neurological: Negative for weakness and numbness.  All other systems reviewed and are negative.      Allergies  Aspirin; Penicillins; Percocet; and Vicodin  Home Medications   Current Outpatient Rx  Name  Route  Sig  Dispense  Refill  . acetaminophen (TYLENOL) 500 MG tablet   Oral   Take 1,000 mg by mouth every 6 (six) hours as needed. pain         . albuterol (PROVENTIL HFA;VENTOLIN HFA) 108 (90 BASE) MCG/ACT inhaler   Inhalation   Inhale 2 puffs into the lungs  every 6 (six) hours as needed. Shortness of breath         . azithromycin (ZITHROMAX Z-PAK) 250 MG tablet   Oral   Take 1 tablet (250 mg total) by mouth daily. 500mg  PO day 1, then 250mg  PO days 205   6 tablet   0   . benzonatate (TESSALON) 100 MG capsule   Oral   Take 2 capsules (200 mg total) by mouth 2 (two) times daily as needed for cough.   20 capsule   0   . meloxicam (MOBIC) 15 MG tablet   Oral   Take 1 tablet (15 mg total) by mouth daily.   20 tablet   0   . predniSONE (DELTASONE) 20 MG tablet   Oral   Take 2 tablets (40 mg total) by mouth daily.   10 tablet   0   . traMADol (ULTRAM) 50 MG tablet   Oral   Take 1 tablet (50 mg total) by mouth every 6 (six) hours as needed.   15 tablet   0    BP 187/94  Pulse 60  Temp(Src) 98.7 F (37.1 C) (Oral)  Resp  16  SpO2 100%  LMP 04/09/2013 Physical Exam  Nursing note and vitals reviewed. Constitutional: She is oriented to person, place, and time. She appears well-developed and well-nourished. No distress.  HENT:  Head: Normocephalic.  Cardiovascular: Normal rate and regular rhythm.   Pulmonary/Chest: Effort normal and breath sounds normal. No respiratory distress. She has no wheezes. She has no rales.  Musculoskeletal:  Pain with ROM of right ankle but otherwise full.  There is pain over the lateral heel area with swelling bellow the lateral malleolus.  No deformity of fibula.  Normal dorsal pedal pulses.  Normal sensations to light touch.  Normal cap refill.  Neurological: She is alert and oriented to person, place, and time.  Skin: Skin is warm and dry. No rash noted.  Psychiatric: She has a normal mood and affect. Her behavior is normal.    ED Course  Procedures   DIAGNOSTIC STUDIES: Oxygen Saturation is 100% on RA.    COORDINATION OF CARE:  Nursing notes reviewed. Vital signs reviewed. Initial pt interview and examination performed.   8:24 PM-Pt seen and evalauated.  Pt well appearing no acute  distress.  No new injury or trauma.  X-rays reviewed with pt.  Bone spurs present.    Imaging Review Dg Ankle Complete Right  04/13/2013   CLINICAL DATA:  Ankle pain and swelling for 2 weeks, history of ankle fracture 14 years ago.  EXAM: RIGHT ANKLE - COMPLETE 3+ VIEW  COMPARISON:  Right ankle radiograph report dated July 30, 2002 though images are not available for direct comparison.  FINDINGS: No fracture deformity nor dislocation. The ankle mortise appears congruent and the tibiofibular syndesmosis intact. No destructive bony lesions. Calcaneal enthesopathy at Achilles insertion. Prominent soft tissues with lateral ankle phleboliths.  IMPRESSION: No acute fracture deformity or dislocation.   Electronically Signed   By: Awilda Metroourtnay  Bloomer   On: 04/13/2013 20:11    MDM   Final diagnoses:  Ankle pain, right  Arthritis  Heel spur        Angus Sellereter S Krystle Oberman, PA-C 04/13/13 2030

## 2013-04-14 NOTE — ED Provider Notes (Signed)
Medical screening examination/treatment/procedure(s) were performed by non-physician practitioner and as supervising physician I was immediately available for consultation/collaboration.    Panagiotis Oelkers L Madine Sarr, MD 04/14/13 1946 

## 2013-06-16 ENCOUNTER — Emergency Department (INDEPENDENT_AMBULATORY_CARE_PROVIDER_SITE_OTHER)
Admission: EM | Admit: 2013-06-16 | Discharge: 2013-06-16 | Disposition: A | Discharge status: Home / Self Care | Payer: Medicaid Other | Source: Home / Self Care

## 2013-06-16 ENCOUNTER — Encounter (HOSPITAL_COMMUNITY): Payer: Self-pay | Admitting: Emergency Medicine

## 2013-06-16 DIAGNOSIS — M25571 Pain in right ankle and joints of right foot: Principal | ICD-10-CM

## 2013-06-16 DIAGNOSIS — M25579 Pain in unspecified ankle and joints of unspecified foot: Secondary | ICD-10-CM

## 2013-06-16 DIAGNOSIS — G8929 Other chronic pain: Secondary | ICD-10-CM

## 2013-06-16 MED ORDER — ACETAMINOPHEN-CODEINE #3 300-30 MG PO TABS: 1.0000 | ORAL_TABLET | Freq: Four times a day (QID) | ORAL | Status: DC | PRN

## 2013-06-16 NOTE — ED Provider Notes (Signed)
Medical screening examination/treatment/procedure(s) were performed by resident physician or non-physician practitioner and as supervising physician I was immediately available for consultation/collaboration.   Rosamund Nyland DOUGLAS MD.   Leisl Spurrier D Ricard Faulkner, MD 06/16/13 1858 

## 2013-06-16 NOTE — Discharge Instructions (Signed)
Ankle Pain Ankle pain is a common symptom. The bones, cartilage, tendons, and muscles of the ankle joint perform a lot of work each day. The ankle joint holds your body weight and allows you to move around. Ankle pain can occur on either side or back of 1 or both ankles. Ankle pain may be sharp and burning or dull and aching. There may be tenderness, stiffness, redness, or warmth around the ankle. The pain occurs more often when a person walks or puts pressure on the ankle. CAUSES  There are many reasons ankle pain can develop. It is important to work with your caregiver to identify the cause since many conditions can impact the bones, cartilage, muscles, and tendons. Causes for ankle pain include:  Injury, including a break (fracture), sprain, or strain often due to a fall, sports, or a high-impact activity.  Swelling (inflammation) of a tendon (tendonitis).  Achilles tendon rupture.  Ankle instability after repeated sprains and strains.  Poor foot alignment.  Pressure on a nerve (tarsal tunnel syndrome).  Arthritis in the ankle or the lining of the ankle.  Crystal formation in the ankle (gout or pseudogout). DIAGNOSIS  A diagnosis is based on your medical history, your symptoms, results of your physical exam, and results of diagnostic tests. Diagnostic tests may include X-ray exams or a computerized magnetic scan (magnetic resonance imaging, MRI). TREATMENT  Treatment will depend on the cause of your ankle pain and may include:  Keeping pressure off the ankle and limiting activities.  Using crutches or other walking support (a cane or brace).  Using rest, ice, compression, and elevation.  Participating in physical therapy or home exercises.  Wearing shoe inserts or special shoes.  Losing weight.  Taking medications to reduce pain or swelling or receiving an injection.  Undergoing surgery. HOME CARE INSTRUCTIONS   Only take over-the-counter or prescription medicines for  pain, discomfort, or fever as directed by your caregiver.  Put ice on the injured area.  Put ice in a plastic bag.  Place a towel between your skin and the bag.  Leave the ice on for 15-20 minutes at a time, 03-04 times a day.  Keep your leg raised (elevated) when possible to lessen swelling.  Avoid activities that cause ankle pain.  Follow specific exercises as directed by your caregiver.  Record how often you have ankle pain, the location of the pain, and what it feels like. This information may be helpful to you and your caregiver.  Ask your caregiver about returning to work or sports and whether you should drive.  Follow up with your caregiver for further examination, therapy, or testing as directed. SEEK MEDICAL CARE IF:   Pain or swelling continues or worsens beyond 1 week.  You have an oral temperature above 102 F (38.9 C).  You are feeling unwell or have chills.  You are having an increasingly difficult time with walking.  You have loss of sensation or other new symptoms.  You have questions or concerns. MAKE SURE YOU:   Understand these instructions.  Will watch your condition.  Will get help right away if you are not doing well or get worse. Document Released: 06/24/2009 Document Revised: 03/29/2011 Document Reviewed: 06/24/2009 [facility] Patient Information 2014 [place].  Arthralgia Arthralgia is joint pain. A joint is a place where two bones meet. Joint pain can happen for many reasons. The joint can be bruised, stiff, infected, or weak from aging. Pain usually goes away after resting and taking medicine for  soreness.  HOME CARE  Rest the joint as told by your doctor.  Keep the sore joint raised (elevated) for the first 24 hours.  Put ice on the joint area.  Put ice in a plastic bag.  Place a towel between your skin and the bag.  Leave the ice on for 15-20 minutes, 03-04 times a day.  Wear your splint, casting, elastic bandage, or sling  as told by your doctor.  Only take medicine as told by your doctor. Do not take aspirin.  Use crutches as told by your doctor. Do not put weight on the joint until told to by your doctor. GET HELP RIGHT AWAY IF:   You have bruising, puffiness (swelling), or more pain.  Your fingers or toes turn blue or start to lose feeling (numb).  Your medicine does not lessen the pain.  Your pain becomes severe.  You have a temperature by mouth above 102 F (38.9 C), not controlled by medicine.  You cannot move or use the joint. MAKE SURE YOU:   Understand these instructions.  Will watch your condition.  Will get help right away if you are not doing well or get worse. Document Released: 12/23/2008 Document Revised: 03/29/2011 Document Reviewed: 12/23/2008 [facility] Patient Information 2014 [place], [place].  Chronic Pain Chronic pain can be defined as pain that is off and on and lasts for 3 6 months or longer. Many things cause chronic pain, which can make it difficult to make a diagnosis. There are many treatment options available for chronic pain. However, finding a treatment that works well for you may require trying various approaches until the right one is found. Many people benefit from a combination of two or more types of treatment to control their pain. SYMPTOMS  Chronic pain can occur anywhere in the body and can range from mild to very severe. Some types of chronic pain include:  Headache.  Low back pain.  Cancer pain.  Arthritis pain.  Neurogenic pain. This is pain resulting from damage to nerves. People with chronic pain may also have other symptoms such as:  Depression.  Anger.  Insomnia.  Anxiety. DIAGNOSIS  Your health care provider will help diagnose your condition over time. In many cases, the initial focus will be on excluding possible conditions that could be causing the pain. Depending on your symptoms, your health care provider may order tests to diagnose  your condition. Some of these tests may include:   Blood tests.   CT scan.   MRI.   X-rays.   Ultrasounds.   Nerve conduction studies.  You may need to see a specialist.  TREATMENT  Finding treatment that works well may take time. You may be referred to a pain specialist. He or she may prescribe medicine or therapies, such as:   Mindful meditation or yoga.  Shots (injections) of numbing or pain-relieving medicines into the spine or area of pain.  Local electrical stimulation.  Acupuncture.   Massage therapy.   Aroma, color, light, or sound therapy.   Biofeedback.   Working with a physical therapist to keep from getting stiff.   Regular, gentle exercise.   Cognitive or behavioral therapy.   Group support.  Sometimes, surgery may be recommended.  HOME CARE INSTRUCTIONS   Take all medicines as directed by your health care provider.   Lessen stress in your life by relaxing and doing things such as listening to calming music.   Exercise or be active as directed by your health care  provider.   Eat a healthy diet and include things such as vegetables, fruits, fish, and lean meats in your diet.   Keep all follow-up appointments with your health care provider.   Attend a support group with others suffering from chronic pain. SEEK MEDICAL CARE IF:   Your pain gets worse.   You develop a new pain that was not there before.   You cannot tolerate medicines given to you by your health care provider.   You have new symptoms since your last visit with your health care provider.  SEEK IMMEDIATE MEDICAL CARE IF:   You feel weak.   You have decreased sensation or numbness.   You lose control of bowel or bladder function.   Your pain suddenly gets much worse.   You develop shaking.  You develop chills.  You develop confusion.  You develop chest pain.  You develop shortness of breath.  MAKE SURE YOU:  Understand these  instructions.  Will watch your condition.  Will get help right away if you are not doing well or get worse. Document Released: 09/26/2001 Document Revised: 09/06/2012 Document Reviewed: 06/30/2012 [facility] Patient Information 2014 [place].

## 2013-06-16 NOTE — ED Provider Notes (Signed)
CSN: [id number]     Arrival date & time 06/16/13  1194 History   First MD Initiated Contact with Patient 06/16/13 1025     Chief Complaint  Patient presents with  . Foot Pain   (Consider location/radiation/quality/duration/timing/severity/associated sxs/prior Treatment) HPI Comments: Chronic intermittent pain of the R ankle after surgery 14 y ago. Worse in past couple of weeks. Taking APAP without relief. Unable to take NSAIDs due to angioedema with ASA.    Past Medical History  Diagnosis Date  . Migraine   . Asthma    Past Surgical History  Procedure Laterality Date  . Tubal ligation     History reviewed. No pertinent family history. History  Substance Use Topics  . Smoking status: Former Games developer  . Smokeless tobacco: Not on file  . Alcohol Use: No   OB History   Grav Para Term Preterm Abortions TAB SAB Ect Mult Living                 Review of Systems  Constitutional: Negative for fever and activity change.  Respiratory: Negative.   Musculoskeletal:       As per HPI  Skin: Negative for color change and rash.  Neurological: Negative.     Allergies  Aspirin; Penicillins; Percocet; Tramadol; and Vicodin  Home Medications   Prior to Admission medications   Medication Sig Start Date End Date Taking? Authorizing Provider  acetaminophen (TYLENOL) 500 MG tablet Take 1,000 mg by mouth every 6 (six) hours as needed. pain    Historical Provider, MD  acetaminophen-codeine (TYLENOL #3) 300-30 MG per tablet Take 1-2 tablets by mouth every 6 (six) hours as needed for moderate pain. 06/16/13   Hayden Rasmussen, NP  albuterol (PROVENTIL HFA;VENTOLIN HFA) 108 (90 BASE) MCG/ACT inhaler Inhale 2 puffs into the lungs every 6 (six) hours as needed. Shortness of breath    Historical Provider, MD  azithromycin (ZITHROMAX Z-PAK) 250 MG tablet Take 1 tablet (250 mg total) by mouth daily. 500mg  PO day 1, then 250mg  PO days 205 12/23/11   Roxy Horseman, PA-C  benzonatate (TESSALON) 100 MG  capsule Take 2 capsules (200 mg total) by mouth 2 (two) times daily as needed for cough. 12/23/11   Roxy Horseman, PA-C  meloxicam (MOBIC) 15 MG tablet Take 1 tablet (15 mg total) by mouth daily. 04/13/13   Phill Mutter Dammen, PA-C  predniSONE (DELTASONE) 20 MG tablet Take 2 tablets (40 mg total) by mouth daily. 12/23/11   Roxy Horseman, PA-C  traMADol (ULTRAM) 50 MG tablet Take 1 tablet (50 mg total) by mouth every 6 (six) hours as needed. 04/13/13   Phill Mutter Dammen, PA-C   BP 163/119  Pulse 70  Temp(Src) 98.1 F (36.7 C) (Oral)  Resp 18  SpO2 97%  LMP 05/28/2013 Physical Exam  Nursing note and vitals reviewed. Constitutional: She is oriented to person, place, and time. She appears well-developed and well-nourished. No distress.  Morbid obesity  HENT:  Head: Normocephalic and atraumatic.  Eyes: EOM are normal.  Neck: Normal range of motion. Neck supple.  Pulmonary/Chest: Effort normal. No respiratory distress.  Neurological: She is alert and oriented to person, place, and time. No cranial nerve deficit.  Skin: Skin is warm and dry.    ED Course  Procedures (including critical care time) Labs Review Labs Reviewed - No data to display  Imaging Review No results found.   MDM   1. Chronic pain of right ankle      Pt st she is  NOT allergic to codeine and can take T #3 without adverse effects Rx #20 tabs ASO wrap  Must f/u with otho Must f/u with PCP on medicaid card for HTN   Hayden Rasmussenavid Nikash Mortensen, NP 06/16/13 1104

## 2013-06-16 NOTE — ED Notes (Signed)
C/o right foot pain and swelling.  Hx of fracture x 2.   No relief with otc pain meds.

## 2013-06-30 ENCOUNTER — Emergency Department (HOSPITAL_COMMUNITY)
Admission: EM | Admit: 2013-06-30 | Discharge: 2013-06-30 | Disposition: A | Discharge status: Home / Self Care | Payer: Medicaid Other | Attending: Emergency Medicine | Admitting: Emergency Medicine

## 2013-06-30 ENCOUNTER — Encounter (HOSPITAL_COMMUNITY): Payer: Self-pay | Admitting: Emergency Medicine

## 2013-06-30 DIAGNOSIS — IMO0002 Reserved for concepts with insufficient information to code with codable children: Secondary | ICD-10-CM | POA: Insufficient documentation

## 2013-06-30 DIAGNOSIS — M722 Plantar fascial fibromatosis: Secondary | ICD-10-CM

## 2013-06-30 DIAGNOSIS — Z8781 Personal history of (healed) traumatic fracture: Secondary | ICD-10-CM | POA: Insufficient documentation

## 2013-06-30 DIAGNOSIS — Z87891 Personal history of nicotine dependence: Secondary | ICD-10-CM | POA: Insufficient documentation

## 2013-06-30 DIAGNOSIS — Z880 Allergy status to penicillin: Secondary | ICD-10-CM | POA: Insufficient documentation

## 2013-06-30 DIAGNOSIS — Z791 Long term (current) use of non-steroidal anti-inflammatories (NSAID): Secondary | ICD-10-CM | POA: Insufficient documentation

## 2013-06-30 DIAGNOSIS — J45909 Unspecified asthma, uncomplicated: Secondary | ICD-10-CM | POA: Insufficient documentation

## 2013-06-30 DIAGNOSIS — G43909 Migraine, unspecified, not intractable, without status migrainosus: Secondary | ICD-10-CM | POA: Insufficient documentation

## 2013-06-30 DIAGNOSIS — Z79899 Other long term (current) drug therapy: Secondary | ICD-10-CM | POA: Insufficient documentation

## 2013-06-30 HISTORY — DX: Other fracture of right lower leg, initial encounter for closed fracture: S82.891A

## 2013-06-30 MED ORDER — MELOXICAM 7.5 MG PO TABS: 7.5000 mg | ORAL_TABLET | Freq: Every day | ORAL | Status: DC

## 2013-06-30 NOTE — ED Notes (Signed)
Ortho tech called for post op boot and ACE wrap.

## 2013-06-30 NOTE — ED Provider Notes (Signed)
Medical screening examination/treatment/procedure(s) were performed by non-physician practitioner and as supervising physician I was immediately available for consultation/collaboration.   EKG Interpretation None        [name], MD 06/30/13 2008

## 2013-06-30 NOTE — ED Notes (Signed)
Pt c/o right ankle pain and swelling since May 30.  Pt has broke in past and has had complications with it ever since. Pt was seen at Urgent care on May30 for the same thing.  Pt unable to put on a closed shoe bc of the swelling.

## 2013-06-30 NOTE — Discharge Instructions (Signed)
Plantar Fasciitis (Heel Spur Syndrome)  with Rehab  The plantar fascia is a fibrous, ligament-like, soft-tissue structure that spans the bottom of the foot. Plantar fasciitis is a condition that causes pain in the foot due to inflammation of the tissue.  SYMPTOMS   · Pain and tenderness on the underneath side of the foot.  · Pain that worsens with standing or walking.  CAUSES   Plantar fasciitis is caused by irritation and injury to the plantar fascia on the underneath side of the foot. Common mechanisms of injury include:  · Direct trauma to bottom of the foot.  · Damage to a small nerve that runs under the foot where the main fascia attaches to the heel bone.  · Stress placed on the plantar fascia due to bone spurs.  RISK INCREASES WITH:   · Activities that place stress on the plantar fascia (running, jumping, pivoting, or cutting).  · Poor strength and flexibility.  · Improperly fitted shoes.  · Tight calf muscles.  · Flat feet.  · Failure to warm-up properly before activity.  · Obesity.  PREVENTION  · Warm up and stretch properly before activity.  · Allow for adequate recovery between workouts.  · Maintain physical fitness:  · Strength, flexibility, and endurance.  · Cardiovascular fitness.  · Maintain a health body weight.  · Avoid stress on the plantar fascia.  · Wear properly fitted shoes, including arch supports for individuals who have flat feet.  PROGNOSIS   If treated properly, then the symptoms of plantar fasciitis usually resolve without surgery. However, occasionally surgery is necessary.  RELATED COMPLICATIONS   · Recurrent symptoms that may result in a chronic condition.  · Problems of the lower back that are caused by compensating for the injury, such as limping.  · Pain or weakness of the foot during push-off following surgery.  · Chronic inflammation, scarring, and partial or complete fascia tear, occurring more often from repeated injections.  TREATMENT   Treatment initially involves the use of  ice and medication to help reduce pain and inflammation. The use of strengthening and stretching exercises may help reduce pain with activity, especially stretches of the Achilles tendon. These exercises may be performed at home or with a therapist. Your caregiver may recommend that you use heel cups of arch supports to help reduce stress on the plantar fascia. Occasionally, corticosteroid injections are given to reduce inflammation. If symptoms persist for greater than 6 months despite non-surgical (conservative), then surgery may be recommended.   MEDICATION   · If pain medication is necessary, then nonsteroidal anti-inflammatory medications, such as aspirin and ibuprofen, or other minor pain relievers, such as acetaminophen, are often recommended.  · Do not take pain medication within 7 days before surgery.  · Prescription pain relievers may be given if deemed necessary by your caregiver. Use only as directed and only as much as you need.  · Corticosteroid injections may be given by your caregiver. These injections should be reserved for the most serious cases, because they may only be given a certain number of times.  HEAT AND COLD  · Cold treatment (icing) relieves pain and reduces inflammation. Cold treatment should be applied for 10 to 15 minutes every 2 to 3 hours for inflammation and pain and immediately after any activity that aggravates your symptoms. Use ice packs or massage the area with a piece of ice (ice massage).  · Heat treatment may be used prior to performing the stretching and strengthening activities prescribed   by your caregiver, physical therapist, or athletic trainer. Use a heat pack or soak the injury in warm water.  SEEK IMMEDIATE MEDICAL CARE IF:  · Treatment seems to offer no benefit, or the condition worsens.  · Any medications produce adverse side effects.  EXERCISES  RANGE OF MOTION (ROM) AND STRETCHING EXERCISES - Plantar Fasciitis (Heel Spur Syndrome)  These exercises may help you  when beginning to rehabilitate your injury. Your symptoms may resolve with or without further involvement from your physician, physical therapist or athletic trainer. While completing these exercises, remember:   · Restoring tissue flexibility helps normal motion to return to the joints. This allows healthier, less painful movement and activity.  · An effective stretch should be held for at least 30 seconds.  · A stretch should never be painful. You should only feel a gentle lengthening or release in the stretched tissue.  RANGE OF MOTION - Toe Extension, Flexion  · Sit with your right / left leg crossed over your opposite knee.  · Grasp your toes and gently pull them back toward the top of your foot. You should feel a stretch on the bottom of your toes and/or foot.  · Hold this stretch for __________ seconds.  · Now, gently pull your toes toward the bottom of your foot. You should feel a stretch on the top of your toes and or foot.  · Hold this stretch for __________ seconds.  Repeat __________ times. Complete this stretch __________ times per day.   RANGE OF MOTION - Ankle Dorsiflexion, Active Assisted  · Remove shoes and sit on a chair that is preferably not on a carpeted surface.  · Place right / left foot under knee. Extend your opposite leg for support.  · Keeping your heel down, slide your right / left foot back toward the chair until you feel a stretch at your ankle or calf. If you do not feel a stretch, slide your bottom forward to the edge of the chair, while still keeping your heel down.  · Hold this stretch for __________ seconds.  Repeat __________ times. Complete this stretch __________ times per day.   STRETCH  Gastroc, Standing  · Place hands on wall.  · Extend right / left leg, keeping the front knee somewhat bent.  · Slightly point your toes inward on your back foot.  · Keeping your right / left heel on the floor and your knee straight, shift your weight toward the wall, not allowing your back to  arch.  · You should feel a gentle stretch in the right / left calf. Hold this position for __________ seconds.  Repeat __________ times. Complete this stretch __________ times per day.  STRETCH  Soleus, Standing  · Place hands on wall.  · Extend right / left leg, keeping the other knee somewhat bent.  · Slightly point your toes inward on your back foot.  · Keep your right / left heel on the floor, bend your back knee, and slightly shift your weight over the back leg so that you feel a gentle stretch deep in your back calf.  · Hold this position for __________ seconds.  Repeat __________ times. Complete this stretch __________ times per day.  STRETCH  Gastrocsoleus, Standing   Note: This exercise can place a lot of stress on your foot and ankle. Please complete this exercise only if specifically instructed by your caregiver.   · Place the ball of your right / left foot on a step, keeping   your other foot firmly on the same step.  · Hold on to the wall or a rail for balance.  · Slowly lift your other foot, allowing your body weight to press your heel down over the edge of the step.  · You should feel a stretch in your right / left calf.  · Hold this position for __________ seconds.  · Repeat this exercise with a slight bend in your right / left knee.  Repeat __________ times. Complete this stretch __________ times per day.   STRENGTHENING EXERCISES - Plantar Fasciitis (Heel Spur Syndrome)   These exercises may help you when beginning to rehabilitate your injury. They may resolve your symptoms with or without further involvement from your physician, physical therapist or athletic trainer. While completing these exercises, remember:   · Muscles can gain both the endurance and the strength needed for everyday activities through controlled exercises.  · Complete these exercises as instructed by your physician, physical therapist or athletic trainer. Progress the resistance and repetitions only as guided.  STRENGTH - Towel  Curls  · Sit in a chair positioned on a non-carpeted surface.  · Place your foot on a towel, keeping your heel on the floor.  · Pull the towel toward your heel by only curling your toes. Keep your heel on the floor.  · If instructed by your physician, physical therapist or athletic trainer, add ____________________ at the end of the towel.  Repeat __________ times. Complete this exercise __________ times per day.  STRENGTH - Ankle Inversion  · Secure one end of a rubber exercise band/tubing to a fixed object (table, pole). Loop the other end around your foot just before your toes.  · Place your fists between your knees. This will focus your strengthening at your ankle.  · Slowly, pull your big toe up and in, making sure the band/tubing is positioned to resist the entire motion.  · Hold this position for __________ seconds.  · Have your muscles resist the band/tubing as it slowly pulls your foot back to the starting position.  Repeat __________ times. Complete this exercises __________ times per day.   Document Released: 01/04/2005 Document Revised: 03/29/2011 Document Reviewed: 04/18/2008  [employer]® Patient Information ©2014 [employer].

## 2013-06-30 NOTE — ED Provider Notes (Signed)
CSN: [id number]     Arrival date & time 06/30/13  1436 History  This chart was scribed for non-physician practitioner, Teressa LowerVrinda Carl Bleecker, NP-C working with [name], MD by [name], ED scribe. This patient was seen in room WTR6/WTR6 and the patient's care was started at 4:01 PM.    Chief Complaint  Patient presents with  . Ankle Pain   The history is provided by the patient. No language interpreter was used.   HPI Comments: [name] is a 43 y.o. female who presents to the Emergency Department complaining of right ankle pain that started 3 weeks ago. Pt [facility] month [facility]. She states that she's broker the ankle in question once before 14 years ago. She states that she had a soft cast place on her foot and was told to come back and have the foot rechecked but she never went back to the orthopaedic. Pt states that she was told that she had a right heel spur and had to get it shaved, but she never went to get it shaved. She states that the pain is worsened by bearing weight. Pt denies any recent injury, falls, or trauma.    Past Medical History  Diagnosis Date  . Migraine   . Asthma   . Ankle fracture, right 2001   Past Surgical History  Procedure Laterality Date  . Tubal ligation     No family history on file. History  Substance Use Topics  . Smoking status: Former Games developermoker  . Smokeless tobacco: Not on file  . Alcohol Use: No   OB History   Grav Para Term Preterm Abortions TAB SAB Ect Mult Living                 Review of Systems  Musculoskeletal: Positive for arthralgias, joint swelling and myalgias.  All other systems reviewed and are negative.  Allergies  Aspirin; Penicillins; Percocet; Tramadol; and Vicodin  Home Medications   Prior to Admission medications   Medication Sig Start Date End Date Taking? Authorizing Provider  acetaminophen (TYLENOL) 500 MG tablet Take 1,000 mg by mouth every 6 (six)  hours as needed. pain    Historical Provider, MD  acetaminophen-codeine (TYLENOL #3) 300-30 MG per tablet Take 1-2 tablets by mouth every 6 (six) hours as needed for moderate pain. 06/16/13   Hayden Rasmussenavid Mabe, NP  albuterol (PROVENTIL HFA;VENTOLIN HFA) 108 (90 BASE) MCG/ACT inhaler Inhale 2 puffs into the lungs every 6 (six) hours as needed. Shortness of breath    Historical Provider, MD  azithromycin (ZITHROMAX Z-PAK) 250 MG tablet Take 1 tablet (250 mg total) by mouth daily. 500mg  PO day 1, then 250mg  PO days 205 12/23/11   Roxy Horsemanobert Browning, PA-C  benzonatate (TESSALON) 100 MG capsule Take 2 capsules (200 mg total) by mouth 2 (two) times daily as needed for cough. 12/23/11   Roxy Horsemanobert Browning, PA-C  meloxicam (MOBIC) 15 MG tablet Take 1 tablet (15 mg total) by mouth daily. 04/13/13   [name], PA-C  predniSONE (DELTASONE) 20 MG tablet Take 2 tablets (40 mg total) by mouth daily. 12/23/11   Roxy Horsemanobert Browning, PA-C  traMADol (ULTRAM) 50 MG tablet Take 1 tablet (50 mg total) by mouth every 6 (six) hours as needed. 04/13/13   [name], PA-C   BP 164/96  Pulse 65  Temp(Src) 98.1 F (36.7 C) (Oral)  Resp 18  SpO2 97%  LMP 05/28/2013  Physical  Exam  Nursing note and vitals reviewed. Constitutional: She is oriented to person, place, and time. She appears well-developed and well-nourished. No distress.  HENT:  Head: Normocephalic and atraumatic.  Eyes: Conjunctivae and EOM are normal.  Neck: Normal range of motion. No thyromegaly present.  Cardiovascular: Normal rate.   Pulmonary/Chest: Effort normal. No respiratory distress.  Musculoskeletal: Normal range of motion. She exhibits tenderness.  Tenderness to the right heal and ankle. No redness swelling or warmth. Pt has full rom  Neurological: She is alert and oriented to person, place, and time.  Skin: Skin is warm and dry.  Psychiatric: She has a normal mood and affect. Her behavior is normal.    ED Course  Procedures (including critical care  time)  DIAGNOSTIC STUDIES: Oxygen Saturation is 97% on RA, adequate by my interpretation.    COORDINATION OF CARE: 4:08 PM- Pt advised of plan for treatment and pt agrees. Will give a referral to local orthopaedic. Will order pain medication.  Labs Review Labs Reviewed - No data to display  Imaging Review No results found.   EKG Interpretation None      MDM   Final diagnoses:  Plantar fasciitis   Pt placed in post op shoe because pt states that she can't fit foot in shoe. Pt given ortho referal  I personally performed the services described in this documentation, which was scribed in my presence. The recorded information has been reviewed and is accurate.    Teressa LowerVrinda [name], NP 06/30/13 1630

## 2013-09-19 ENCOUNTER — Emergency Department (HOSPITAL_COMMUNITY)
Admission: EM | Admit: 2013-09-19 | Discharge: 2013-09-19 | Disposition: A | Discharge status: Home / Self Care | Payer: Medicaid Other | Attending: Emergency Medicine | Admitting: Emergency Medicine

## 2013-09-19 ENCOUNTER — Encounter (HOSPITAL_COMMUNITY): Payer: Self-pay | Admitting: Emergency Medicine

## 2013-09-19 DIAGNOSIS — Z880 Allergy status to penicillin: Secondary | ICD-10-CM | POA: Insufficient documentation

## 2013-09-19 DIAGNOSIS — Z791 Long term (current) use of non-steroidal anti-inflammatories (NSAID): Secondary | ICD-10-CM | POA: Diagnosis not present

## 2013-09-19 DIAGNOSIS — Z8679 Personal history of other diseases of the circulatory system: Secondary | ICD-10-CM | POA: Insufficient documentation

## 2013-09-19 DIAGNOSIS — M722 Plantar fascial fibromatosis: Secondary | ICD-10-CM | POA: Insufficient documentation

## 2013-09-19 DIAGNOSIS — Z8781 Personal history of (healed) traumatic fracture: Secondary | ICD-10-CM | POA: Insufficient documentation

## 2013-09-19 DIAGNOSIS — Z87891 Personal history of nicotine dependence: Secondary | ICD-10-CM | POA: Diagnosis not present

## 2013-09-19 DIAGNOSIS — J45909 Unspecified asthma, uncomplicated: Secondary | ICD-10-CM | POA: Diagnosis not present

## 2013-09-19 DIAGNOSIS — M79609 Pain in unspecified limb: Secondary | ICD-10-CM | POA: Insufficient documentation

## 2013-09-19 DIAGNOSIS — M79671 Pain in right foot: Secondary | ICD-10-CM

## 2013-09-19 HISTORY — DX: Plantar fascial fibromatosis: M72.2

## 2013-09-19 MED ORDER — TRAMADOL HCL 50 MG PO TABS: 50.0000 mg | ORAL_TABLET | Freq: Four times a day (QID) | ORAL | Status: DC | PRN

## 2013-09-19 MED ORDER — ACETAMINOPHEN-CODEINE #3 300-30 MG PO TABS: 1.0000 | ORAL_TABLET | Freq: Four times a day (QID) | ORAL | Status: DC | PRN

## 2013-09-19 NOTE — Discharge Instructions (Signed)
Continue to use her cam walker as directed by her orthopedic doctor. Continue to use ice and heat, alternating between the two for 20 minutes at a time, to help relieve her pain. Use an ice water bottle that has been frozen and roll your foot over the ice for 20 minutes. Use Tylenol #3 and tramadol as needed to help with her pain, but do not operate heavy machinery or drive will taking his medications. Continue to use Meloxicam as directed to help relieve inflammation and pain. Followup with your orthopedic doctor at your regularly scheduled appointment next week. Return to ER for any changes or worsening symptoms.   Plantar Fasciitis (Heel Spur Syndrome) with Rehab The plantar fascia is a fibrous, ligament-like, soft-tissue structure that spans the bottom of the foot. Plantar fasciitis is a condition that causes pain in the foot due to inflammation of the tissue. SYMPTOMS   Pain and tenderness on the underneath side of the foot.  Pain that worsens with standing or walking. CAUSES  Plantar fasciitis is caused by irritation and injury to the plantar fascia on the underneath side of the foot. Common mechanisms of injury include:  Direct trauma to bottom of the foot.  Damage to a small nerve that runs under the foot where the main fascia attaches to the heel bone.  Stress placed on the plantar fascia due to bone spurs. RISK INCREASES WITH:   Activities that place stress on the plantar fascia (running, jumping, pivoting, or cutting).  Poor strength and flexibility.  Improperly fitted shoes.  Tight calf muscles.  Flat feet.  Failure to warm-up properly before activity.  Obesity. PREVENTION  Warm up and stretch properly before activity.  Allow for adequate recovery between workouts.  Maintain physical fitness:  Strength, flexibility, and endurance.  Cardiovascular fitness.  Maintain a health body weight.  Avoid stress on the plantar fascia.  Wear properly fitted shoes,  including arch supports for individuals who have flat feet. PROGNOSIS  If treated properly, then the symptoms of plantar fasciitis usually resolve without surgery. However, occasionally surgery is necessary. RELATED COMPLICATIONS   Recurrent symptoms that may result in a chronic condition.  Problems of the lower back that are caused by compensating for the injury, such as limping.  Pain or weakness of the foot during push-off following surgery.  Chronic inflammation, scarring, and partial or complete fascia tear, occurring more often from repeated injections. TREATMENT  Treatment initially involves the use of ice and medication to help reduce pain and inflammation. The use of strengthening and stretching exercises may help reduce pain with activity, especially stretches of the Achilles tendon. These exercises may be performed at home or with a therapist. Your caregiver may recommend that you use heel cups of arch supports to help reduce stress on the plantar fascia. Occasionally, corticosteroid injections are given to reduce inflammation. If symptoms persist for greater than 6 months despite non-surgical (conservative), then surgery may be recommended.  MEDICATION   If pain medication is necessary, then nonsteroidal anti-inflammatory medications, such as aspirin and ibuprofen, or other minor pain relievers, such as acetaminophen, are often recommended.  Do not take pain medication within 7 days before surgery.  Prescription pain relievers may be given if deemed necessary by your caregiver. Use only as directed and only as much as you need.  Corticosteroid injections may be given by your caregiver. These injections should be reserved for the most serious cases, because they may only be given a certain number of times. HEAT AND  COLD  Cold treatment (icing) relieves pain and reduces inflammation. Cold treatment should be applied for 10 to 15 minutes every 2 to 3 hours for inflammation and pain  and immediately after any activity that aggravates your symptoms. Use ice packs or massage the area with a piece of ice (ice massage).  Heat treatment may be used prior to performing the stretching and strengthening activities prescribed by your caregiver, physical therapist, or athletic trainer. Use a heat pack or soak the injury in warm water. SEEK IMMEDIATE MEDICAL CARE IF:  Treatment seems to offer no benefit, or the condition worsens.  Any medications produce adverse side effects. EXERCISES RANGE OF MOTION (ROM) AND STRETCHING EXERCISES - Plantar Fasciitis (Heel Spur Syndrome) These exercises may help you when beginning to rehabilitate your injury. Your symptoms may resolve with or without further involvement from your physician, physical therapist or athletic trainer. While completing these exercises, remember:   Restoring tissue flexibility helps normal motion to return to the joints. This allows healthier, less painful movement and activity.  An effective stretch should be held for at least 30 seconds.  A stretch should never be painful. You should only feel a gentle lengthening or release in the stretched tissue. RANGE OF MOTION - Toe Extension, Flexion  Sit with your right / left leg crossed over your opposite knee.  Grasp your toes and gently pull them back toward the top of your foot. You should feel a stretch on the bottom of your toes and/or foot.  Hold this stretch for __________ seconds.  Now, gently pull your toes toward the bottom of your foot. You should feel a stretch on the top of your toes and or foot.  Hold this stretch for __________ seconds. Repeat __________ times. Complete this stretch __________ times per day.  RANGE OF MOTION - Ankle Dorsiflexion, Active Assisted  Remove shoes and sit on a chair that is preferably not on a carpeted surface.  Place right / left foot under knee. Extend your opposite leg for support.  Keeping your heel down, slide your  right / left foot back toward the chair until you feel a stretch at your ankle or calf. If you do not feel a stretch, slide your bottom forward to the edge of the chair, while still keeping your heel down.  Hold this stretch for __________ seconds. Repeat __________ times. Complete this stretch __________ times per day.  STRETCH - Gastroc, Standing  Place hands on wall.  Extend right / left leg, keeping the front knee somewhat bent.  Slightly point your toes inward on your back foot.  Keeping your right / left heel on the floor and your knee straight, shift your weight toward the wall, not allowing your back to arch.  You should feel a gentle stretch in the right / left calf. Hold this position for __________ seconds. Repeat __________ times. Complete this stretch __________ times per day. STRETCH - Soleus, Standing  Place hands on wall.  Extend right / left leg, keeping the other knee somewhat bent.  Slightly point your toes inward on your back foot.  Keep your right / left heel on the floor, bend your back knee, and slightly shift your weight over the back leg so that you feel a gentle stretch deep in your back calf.  Hold this position for __________ seconds. Repeat __________ times. Complete this stretch __________ times per day. STRETCH - Gastrocsoleus, Standing  Note: This exercise can place a lot of stress on your foot  and ankle. Please complete this exercise only if specifically instructed by your caregiver.   Place the ball of your right / left foot on a step, keeping your other foot firmly on the same step.  Hold on to the wall or a rail for balance.  Slowly lift your other foot, allowing your body weight to press your heel down over the edge of the step.  You should feel a stretch in your right / left calf.  Hold this position for __________ seconds.  Repeat this exercise with a slight bend in your right / left knee. Repeat __________ times. Complete this stretch  __________ times per day.  STRENGTHENING EXERCISES - Plantar Fasciitis (Heel Spur Syndrome)  These exercises may help you when beginning to rehabilitate your injury. They may resolve your symptoms with or without further involvement from your physician, physical therapist or athletic trainer. While completing these exercises, remember:   Muscles can gain both the endurance and the strength needed for everyday activities through controlled exercises.  Complete these exercises as instructed by your physician, physical therapist or athletic trainer. Progress the resistance and repetitions only as guided. STRENGTH - Towel Curls  Sit in a chair positioned on a non-carpeted surface.  Place your foot on a towel, keeping your heel on the floor.  Pull the towel toward your heel by only curling your toes. Keep your heel on the floor.  If instructed by your physician, physical therapist or athletic trainer, add ____________________ at the end of the towel. Repeat __________ times. Complete this exercise __________ times per day. STRENGTH - Ankle Inversion  Secure one end of a rubber exercise band/tubing to a fixed object (table, pole). Loop the other end around your foot just before your toes.  Place your fists between your knees. This will focus your strengthening at your ankle.  Slowly, pull your big toe up and in, making sure the band/tubing is positioned to resist the entire motion.  Hold this position for __________ seconds.  Have your muscles resist the band/tubing as it slowly pulls your foot back to the starting position. Repeat __________ times. Complete this exercises __________ times per day.  Document Released: 01/04/2005 Document Revised: 03/29/2011 Document Reviewed: 04/18/2008 [facility] Patient Information 2015 [place], [place]. This information is not intended to replace advice given to you by your health care provider. Make sure you discuss any questions you have with your health  care provider.  Cryotherapy Cryotherapy is when you put ice on your injury. Ice helps lessen pain and puffiness (swelling) after an injury. Ice works the best when you start using it in the first 24 to 48 hours after an injury. HOME CARE  Put a dry or damp towel between the ice pack and your skin.  You may press gently on the ice pack.  Leave the ice on for no more than 10 to 20 minutes at a time.  Check your skin after 5 minutes to make sure your skin is okay.  Rest at least 20 minutes between ice pack uses.  Stop using ice when your skin loses feeling (numbness).  Do not use ice on someone who cannot tell you when it hurts. This includes small children and people with memory problems (dementia). GET HELP RIGHT AWAY IF:  You have white spots on your skin.  Your skin turns blue or pale.  Your skin feels waxy or hard.  Your puffiness gets worse. MAKE SURE YOU:   Understand these instructions.  Will watch your condition.  Will get help right away if you are not doing well or get worse. Document Released: 06/23/2007 Document Revised: 03/29/2011 Document Reviewed: 08/27/2010 [facility] Patient Information 2015 [place], [place]. This information is not intended to replace advice given to you by your health care provider. Make sure you discuss any questions you have with your health care provider.  Heat Therapy Heat therapy can help make painful, stiff muscles and joints feel better. Do not use heat on new injuries. Wait at least 48 hours after an injury to use heat. Do not use heat when you have aches or pains right after an activity. If you still have pain 3 hours after stopping the activity, then you may use heat. HOME CARE Wet heat pack  Soak a clean towel in warm water. Squeeze out the extra water.  Put the warm, wet towel in a plastic bag.  Place a thin, dry towel between your skin and the bag.  Put the heat pack on the area for 5 minutes, and check your skin. Your skin  may be pink, but it should not be red.  Leave the heat pack on the area for 15 to 30 minutes.  Repeat this every 2 to 4 hours while awake. Do not use heat while you are sleeping. Warm water bath  Fill a tub with warm water.  Place the affected body part in the tub.  Soak the area for 20 to 40 minutes.  Repeat as needed. Hot water bottle  Fill the water bottle half full with hot water.  Press out the extra air. Close the cap tightly.  Place a dry towel between your skin and the bottle.  Put the bottle on the area for 5 minutes, and check your skin. Your skin may be pink, but it should not be red.  Leave the bottle on the area for 15 to 30 minutes.  Repeat this every 2 to 4 hours while awake. Electric heating pad  Place a dry towel between your skin and the heating pad.  Set the heating pad on low heat.  Put the heating pad on the area for 10 minutes, and check your skin. Your skin may be pink, but it should not be red.  Leave the heating pad on the area for 20 to 40 minutes.  Repeat this every 2 to 4 hours while awake.  Do not lie on the heating pad.  Do not fall asleep while using the heating pad.  Do not use the heating pad near water. GET HELP RIGHT AWAY IF:  You get blisters or red skin.  Your skin is puffy (swollen), or you lose feeling (numbness) in the affected area.  You have any new problems.  Your problems are getting worse.  You have any questions or concerns. If you have any problems, stop using heat therapy until you see your doctor. MAKE SURE YOU:  Understand these instructions.  Will watch your condition.  Will get help right away if you are not doing well or get worse. Document Released: 03/29/2011 Document Reviewed: 02/27/2013 [facility] Patient Information 2015 [place], [place]. This information is not intended to replace advice given to you by your health care provider. Make sure you discuss any questions you have with your health care  provider.

## 2013-09-19 NOTE — ED Notes (Addendum)
Pt c/o sharp pain and numbness to R foot.  Pain score 10/10.  Pt reports that she has been seen at [facility], 8/13, for same and they recently injected her foot.  Pt reports foot was numb for 2 days, the numbness went away, and return x 1 week ago.  Hx of plantar fascititis.  Pt would, also, like a new boot.  Pt has an Ortho follow up x 1 week.

## 2013-09-19 NOTE — ED Provider Notes (Signed)
Medical screening examination/treatment/procedure(s) were performed by non-physician practitioner and as supervising physician I was immediately available for consultation/collaboration.   EKG Interpretation None        Purvis Sheffield, MD 09/19/13 1546

## 2013-09-19 NOTE — ED Provider Notes (Signed)
CSN: [id number]     Arrival date & time 09/19/13  1032 History   First MD Initiated Contact with Patient 09/19/13 1100     Chief Complaint  Patient presents with  . Foot Pain  . Foot numbness      (Consider location/radiation/quality/duration/timing/severity/associated sxs/prior Treatment) HPI Comments: Melanie Hess is a 43 y.o. female with a PMHx of plantar fasciitis of the R foot, who presents to the ED today with complaints of ongoing plantar fasciitis pain in her right foot. She describes the pain as 10/10 sharp constant nonradiating pain, worse with ambulation, and mildly improved with ice, heat, MOBIC, Tylenol 3, tramadol, and Epson salt soaks. She states that she was seen by her orthopedic doctor on 8/13 and had her foot injected with cortisone. She states that she had relief of her symptoms for approximately 2 days, but they returned approximately one week ago. States that she's been using a Personal assistant, which has recently broken, leading her to need to use a postop shoe which doesn't help her pain. She states that after the injection she had some numbness, which resolved, but recently she has been having more tingling in the bottom side of her foot. Denies any fevers, chills, inability to walk, color changes, rashes, lesions, myalgias, or other arthralgias. She denies any new trauma or injury to the foot. She has a followup appointment in one week.  Patient is a 43 y.o. female presenting with lower extremity pain. The history is provided by the patient. No language interpreter was used.  Foot Pain This is a chronic problem. The current episode started in the past 7 days. The problem occurs constantly. The problem has been unchanged. Pertinent negatives include no arthralgias, fever, joint swelling, myalgias, numbness, rash or weakness. The symptoms are aggravated by walking. She has tried ice, NSAIDs, rest, heat and oral narcotics for the symptoms. The treatment provided mild  relief.    Past Medical History  Diagnosis Date  . Migraine   . Asthma   . Ankle fracture, right 2001  . Plantar fasciitis of right foot    Past Surgical History  Procedure Laterality Date  . Tubal ligation     History reviewed. No pertinent family history. History  Substance Use Topics  . Smoking status: Former Games developer  . Smokeless tobacco: Not on file  . Alcohol Use: No   OB History   Grav Para Term Preterm Abortions TAB SAB Ect Mult Living                 Review of Systems  Constitutional: Negative for fever.  Musculoskeletal: Negative for arthralgias, joint swelling and myalgias.  Skin: Negative for rash.  Neurological: Negative for weakness and numbness.  10 Systems reviewed and are negative for acute change except as noted in the HPI.     Allergies  Aspirin; Penicillins; Percocet; Tramadol; and Vicodin  Home Medications   Prior to Admission medications   Medication Sig Start Date End Date Taking? Authorizing Provider  acetaminophen-codeine (TYLENOL #3) 300-30 MG per tablet Take 1-2 tablets by mouth every 6 (six) hours as needed for moderate pain. 06/16/13   Hayden Rasmussen, NP  acetaminophen-codeine (TYLENOL #3) 300-30 MG per tablet Take 1 tablet by mouth every 6 (six) hours as needed for moderate pain. 09/19/13   Nero Sawatzky Strupp Camprubi-Soms, PA-C  albuterol (PROVENTIL HFA;VENTOLIN HFA) 108 (90 BASE) MCG/ACT inhaler Inhale 2 puffs into the lungs every 6 (six) hours as needed. Shortness of breath  Historical Provider, MD  meloxicam (MOBIC) 7.5 MG tablet Take 1 tablet (7.5 mg total) by mouth daily. 06/30/13   Teressa Lower, NP  traMADol (ULTRAM) 50 MG tablet Take 1 tablet (50 mg total) by mouth every 6 (six) hours as needed. 04/13/13   Phill Mutter Dammen, PA-C  traMADol (ULTRAM) 50 MG tablet Take 1 tablet (50 mg total) by mouth every 6 (six) hours as needed. 09/19/13   Laiya Wisby Strupp Camprubi-Soms, PA-C   BP 134/77  Pulse 63  Temp(Src) 98.2 F (36.8 C) (Oral)  Resp 20   SpO2 100% Physical Exam  Nursing note and vitals reviewed. Constitutional: She is oriented to person, place, and time. Vital signs are normal. She appears well-developed and well-nourished. No distress.  VSS, NAD. Obese pleasant female  HENT:  Head: Normocephalic and atraumatic.  Mouth/Throat: Mucous membranes are normal.  Eyes: Conjunctivae and EOM are normal. Right eye exhibits no discharge. Left eye exhibits no discharge.  Neck: Normal range of motion. Neck supple.  Cardiovascular: Normal rate and intact distal pulses.   Pulmonary/Chest: Effort normal.  Abdominal: Normal appearance. She exhibits no distension.  Musculoskeletal: Normal range of motion.  FROM in R ankle and foot, wiggles toes well. TTP at insertion of plantar fascia, near calcaneus. No skin changes. Sensation and strength at baseline.   Neurological: She is alert and oriented to person, place, and time. She has normal strength. No sensory deficit. Gait abnormal.  Mildly antalgic gait, limps to avoid pressure to R foot  Skin: Skin is warm, dry and intact. No rash noted.  Psychiatric: She has a normal mood and affect.    ED Course  Procedures (including critical care time) Labs Review Labs Reviewed - No data to display  Imaging Review No results found.   EKG Interpretation None      MDM   Final diagnoses:  Plantar fasciitis of right foot  Foot pain, right   43y/o female with ongoing plantar fasciitis pain. States that Tylenol 3, MOBIC, and tramadol worked for her pain. Has been using ice and heat appropriately. Will apply a new Cam Walker, given that hers is currently broken. Will refill Tylenol 3 and give a few tabs of tramadol. Low clinical suspicion for any other acute injury or trauma to this foot, therefore no imaging was obtained. She will be following up with her orthopedic doctor in one week. I explained the diagnosis and have given explicit precautions to return to the ER including for any other new  or worsening symptoms. The patient understands and accepts the medical plan as it's been dictated and I have answered their questions. Discharge instructions concerning home care and prescriptions have been given. The patient is STABLE and is discharged to home in good condition.  BP 134/77  Pulse 63  Temp(Src) 98.2 F (36.8 C) (Oral)  Resp 20  SpO2 100%  Meds ordered this encounter  Medications  . acetaminophen-codeine (TYLENOL #3) 300-30 MG per tablet    Sig: Take 1 tablet by mouth every 6 (six) hours as needed for moderate pain.    Dispense:  20 tablet    Refill:  0    Order Specific Question:  Supervising Provider    Answer:  Eber Hong D [[id number]]  . traMADol (ULTRAM) 50 MG tablet    Sig: Take 1 tablet (50 mg total) by mouth every 6 (six) hours as needed.    Dispense:  15 tablet    Refill:  0    Order Specific Question:  Supervising Provider    Answer:  Eber Hong D [id number] Camprubi-Soms, PA-C 09/19/13 [phone]

## 2013-12-27 ENCOUNTER — Encounter (HOSPITAL_COMMUNITY): Payer: Self-pay

## 2013-12-27 ENCOUNTER — Emergency Department (HOSPITAL_COMMUNITY)
Admission: EM | Admit: 2013-12-27 | Discharge: 2013-12-27 | Disposition: A | Discharge status: Home / Self Care | Payer: Medicaid Other | Attending: Emergency Medicine | Admitting: Emergency Medicine

## 2013-12-27 DIAGNOSIS — R0602 Shortness of breath: Secondary | ICD-10-CM | POA: Diagnosis present

## 2013-12-27 DIAGNOSIS — Z8781 Personal history of (healed) traumatic fracture: Secondary | ICD-10-CM | POA: Insufficient documentation

## 2013-12-27 DIAGNOSIS — Z880 Allergy status to penicillin: Secondary | ICD-10-CM | POA: Diagnosis not present

## 2013-12-27 DIAGNOSIS — Z8679 Personal history of other diseases of the circulatory system: Secondary | ICD-10-CM | POA: Diagnosis not present

## 2013-12-27 DIAGNOSIS — J45901 Unspecified asthma with (acute) exacerbation: Secondary | ICD-10-CM | POA: Diagnosis not present

## 2013-12-27 DIAGNOSIS — Z791 Long term (current) use of non-steroidal anti-inflammatories (NSAID): Secondary | ICD-10-CM | POA: Diagnosis not present

## 2013-12-27 MED ORDER — BENZONATATE 100 MG PO CAPS: 100.0000 mg | ORAL_CAPSULE | Freq: Three times a day (TID) | ORAL | Status: DC

## 2013-12-27 MED ORDER — PREDNISONE 20 MG PO TABS: 60.0000 mg | ORAL_TABLET | Freq: Every day | ORAL | Status: DC

## 2013-12-27 NOTE — ED Notes (Signed)
Patient reports severe, worsening dyspnea unrelieved by home rescue inhaler.  En route, EMS administered 10mg  Albuterol neb/ 1mg  Atrovent/ 125mg  Solumedrol IV.

## 2013-12-27 NOTE — ED Notes (Signed)
Pt. On cardiac monitor. 

## 2013-12-27 NOTE — ED Notes (Signed)
Writer ambulated to the bathroom O2 sats at [employer]

## 2013-12-27 NOTE — ED Provider Notes (Signed)
CSN: [id number]     Arrival date & time 12/27/13  0235 History   First MD Initiated Contact with Patient 12/27/13 0329     Chief Complaint  Patient presents with  . Shortness of Breath     (Consider location/radiation/quality/duration/timing/severity/associated sxs/prior Treatment) Patient is a 43 y.o. female presenting with shortness of breath. The history is provided by the patient. No language interpreter was used.  Shortness of Breath Severity:  Moderate Onset quality:  Gradual Duration:  1 week Associated symptoms: wheezing   Associated symptoms: no fever   Associated symptoms comment:  She reports several days of wheezing, partially and transiently relieved with inhaler use. She has a nebulizer machine but it is not functional. No fever. She states this feels like her asthma.    Past Medical History  Diagnosis Date  . Migraine   . Asthma   . Ankle fracture, right 2001  . Plantar fasciitis of right foot    Past Surgical History  Procedure Laterality Date  . Tubal ligation     History reviewed. No pertinent family history. History  Substance Use Topics  . Smoking status: Former Games developermoker  . Smokeless tobacco: Not on file  . Alcohol Use: No   OB History    No data available     Review of Systems  Constitutional: Negative for fever and chills.  HENT: Positive for congestion.   Respiratory: Positive for chest tightness, shortness of breath and wheezing.   Gastrointestinal: Negative.   Musculoskeletal: Negative.   Skin: Negative.   Neurological: Negative.       Allergies  Aspirin; Penicillins; Percocet; Tramadol; and Vicodin  Home Medications   Prior to Admission medications   Medication Sig Start Date End Date Taking? Authorizing Provider  acetaminophen-codeine (TYLENOL #3) 300-30 MG per tablet Take 1 tablet by mouth every 6 (six) hours as needed for moderate pain. 09/19/13  Yes Mercedes Strupp Camprubi-Soms, PA-C  albuterol (PROVENTIL HFA;VENTOLIN HFA) 108  (90 BASE) MCG/ACT inhaler Inhale 2 puffs into the lungs every 6 (six) hours as needed. Shortness of breath   Yes Historical Provider, MD  acetaminophen-codeine (TYLENOL #3) 300-30 MG per tablet Take 1-2 tablets by mouth every 6 (six) hours as needed for moderate pain. Patient not taking: Reported on 12/27/2013 06/16/13   Hayden Rasmussenavid Mabe, NP  meloxicam (MOBIC) 7.5 MG tablet Take 1 tablet (7.5 mg total) by mouth daily. Patient not taking: Reported on 12/27/2013 06/30/13   Teressa LowerVrinda Pickering, NP  traMADol (ULTRAM) 50 MG tablet Take 1 tablet (50 mg total) by mouth every 6 (six) hours as needed. Patient not taking: Reported on 12/27/2013 04/13/13   [name], PA-C  traMADol (ULTRAM) 50 MG tablet Take 1 tablet (50 mg total) by mouth every 6 (six) hours as needed. Patient not taking: Reported on 12/27/2013 09/19/13   Donnita FallsMercedes Strupp Camprubi-Soms, PA-C   BP 169/87 mmHg  Pulse 86  Temp(Src) 97.8 F (36.6 C) (Oral)  Resp 20  SpO2 98%  LMP 12/18/2013 Physical Exam  Constitutional: She is oriented to person, place, and time. She appears well-developed and well-nourished.  HENT:  Head: Normocephalic.  Neck: Normal range of motion. Neck supple.  Cardiovascular: Normal rate and regular rhythm.   Pulmonary/Chest: Effort normal and breath sounds normal. She has no wheezes. She has no rales.  Abdominal: Soft. Bowel sounds are normal. There is no tenderness. There is no rebound and no guarding.  Musculoskeletal: Normal range of motion.  Neurological: She is alert and oriented to person, place,  and time.  Skin: Skin is warm and dry. No rash noted.  Psychiatric: She has a normal mood and affect.    ED Course  Procedures (including critical care time) Labs Review Labs Reviewed - No data to display  Imaging Review No results found.   EKG Interpretation None      MDM   Final diagnoses:  None    1. Asthma exacerbation  She feels improved after nebulizer given in route. She has had solumedrol. O2  Saturations are 92% while ambulatory. Will observe and re-examine.   5:00 - re-examination: she continues to have clear breathing and feels she is better enough for discharge home. Discussed outpatient follow up with Dr. Mayford KnifeWilliams to discuss home nebulizer machine. Will continue steroids, provide cough medication.  Arnoldo HookerShari A Magaby Rumberger, PA-C 12/27/13 [id number]  Hanley SeamenJohn L Molpus, MD 12/27/13 [phone]

## 2013-12-27 NOTE — ED Notes (Signed)
Shari, PA at bedside. 

## 2013-12-27 NOTE — Discharge Instructions (Signed)

## 2013-12-27 NOTE — ED Notes (Signed)
Bed: WA04 Expected date:  Expected time:  Means of arrival:  Comments: EMS wheezing on HHn and had solumedrol

## 2013-12-31 ENCOUNTER — Emergency Department (HOSPITAL_COMMUNITY)
Admission: EM | Admit: 2013-12-31 | Discharge: 2013-12-31 | Disposition: A | Discharge status: Home / Self Care | Payer: Medicaid Other | Attending: Emergency Medicine | Admitting: Emergency Medicine

## 2013-12-31 ENCOUNTER — Encounter (HOSPITAL_COMMUNITY): Payer: Self-pay | Admitting: Emergency Medicine

## 2013-12-31 ENCOUNTER — Emergency Department (HOSPITAL_COMMUNITY): Payer: Medicaid Other

## 2013-12-31 DIAGNOSIS — Z880 Allergy status to penicillin: Secondary | ICD-10-CM | POA: Diagnosis not present

## 2013-12-31 DIAGNOSIS — Z8781 Personal history of (healed) traumatic fracture: Secondary | ICD-10-CM | POA: Diagnosis not present

## 2013-12-31 DIAGNOSIS — R0602 Shortness of breath: Secondary | ICD-10-CM

## 2013-12-31 DIAGNOSIS — Z79899 Other long term (current) drug therapy: Secondary | ICD-10-CM | POA: Insufficient documentation

## 2013-12-31 DIAGNOSIS — G43909 Migraine, unspecified, not intractable, without status migrainosus: Secondary | ICD-10-CM | POA: Diagnosis not present

## 2013-12-31 DIAGNOSIS — J189 Pneumonia, unspecified organism: Secondary | ICD-10-CM

## 2013-12-31 DIAGNOSIS — J45901 Unspecified asthma with (acute) exacerbation: Secondary | ICD-10-CM | POA: Insufficient documentation

## 2013-12-31 DIAGNOSIS — Z7951 Long term (current) use of inhaled steroids: Secondary | ICD-10-CM | POA: Insufficient documentation

## 2013-12-31 DIAGNOSIS — R531 Weakness: Secondary | ICD-10-CM | POA: Diagnosis not present

## 2013-12-31 DIAGNOSIS — R079 Chest pain, unspecified: Secondary | ICD-10-CM

## 2013-12-31 DIAGNOSIS — Z87891 Personal history of nicotine dependence: Secondary | ICD-10-CM | POA: Insufficient documentation

## 2013-12-31 DIAGNOSIS — J159 Unspecified bacterial pneumonia: Secondary | ICD-10-CM | POA: Diagnosis not present

## 2013-12-31 DIAGNOSIS — R0682 Tachypnea, not elsewhere classified: Secondary | ICD-10-CM

## 2013-12-31 DIAGNOSIS — Z7952 Long term (current) use of systemic steroids: Secondary | ICD-10-CM | POA: Diagnosis not present

## 2013-12-31 LAB — PRO B NATRIURETIC PEPTIDE: Pro B Natriuretic peptide (BNP): 36 pg/mL (ref 0–125)

## 2013-12-31 LAB — CBC
HEMATOCRIT: 38.2 % (ref 36.0–46.0)
Hemoglobin: 12.9 g/dL (ref 12.0–15.0)
MCH: 28.1 pg (ref 26.0–34.0)
MCHC: 33.8 g/dL (ref 30.0–36.0)
MCV: 83.2 fL (ref 78.0–100.0)
Platelets: 288 10*3/uL (ref 150–400)
RBC: 4.59 MIL/uL (ref 3.87–5.11)
RDW: 14.9 % (ref 11.5–15.5)
WBC: 10 10*3/uL (ref 4.0–10.5)

## 2013-12-31 LAB — BASIC METABOLIC PANEL
ANION GAP: 18 — AB (ref 5–15)
BUN: 29 mg/dL — ABNORMAL HIGH (ref 6–23)
CHLORIDE: 101 meq/L (ref 96–112)
CO2: 21 mEq/L (ref 19–32)
Calcium: 9.4 mg/dL (ref 8.4–10.5)
Creatinine, Ser: 1.16 mg/dL — ABNORMAL HIGH (ref 0.50–1.10)
GFR calc Af Amer: 65 mL/min — ABNORMAL LOW (ref >90)
GFR calc non Af Amer: 56 mL/min — ABNORMAL LOW (ref >90)
GLUCOSE: 118 mg/dL — AB (ref 70–99)
Potassium: 3.7 mEq/L (ref 3.7–5.3)
SODIUM: 140 meq/L (ref 137–147)

## 2013-12-31 LAB — I-STAT TROPONIN, ED: Troponin i, poc: 0.01 ng/mL (ref 0.00–0.08)

## 2013-12-31 LAB — I-STAT CHEM 8, ED
BUN: 29 mg/dL — ABNORMAL HIGH (ref 6–23)
CHLORIDE: 104 meq/L (ref 96–112)
Calcium, Ion: 1.06 mmol/L — ABNORMAL LOW (ref 1.12–1.23)
Creatinine, Ser: 1.3 mg/dL — ABNORMAL HIGH (ref 0.50–1.10)
Glucose, Bld: 123 mg/dL — ABNORMAL HIGH (ref 70–99)
HCT: 45 % (ref 36.0–46.0)
Hemoglobin: 15.3 g/dL — ABNORMAL HIGH (ref 12.0–15.0)
Potassium: 3.3 mEq/L — ABNORMAL LOW (ref 3.7–5.3)
Sodium: 139 mEq/L (ref 137–147)
TCO2: 22 mmol/L (ref 0–100)

## 2013-12-31 MED ORDER — IOHEXOL 350 MG/ML SOLN
100.0000 mL | Freq: Once | INTRAVENOUS | Status: AC | PRN
  Administered 2013-12-31: 100 mL via INTRAVENOUS

## 2013-12-31 MED ORDER — AZITHROMYCIN 250 MG PO TABS: 250.0000 mg | ORAL_TABLET | Freq: Every day | ORAL | Status: DC

## 2013-12-31 MED ORDER — LORAZEPAM 2 MG/ML IJ SOLN
0.5000 mg | Freq: Once | INTRAMUSCULAR | Status: AC
  Administered 2013-12-31: 0.5 mg via INTRAVENOUS
  Filled 2013-12-31: qty 1

## 2013-12-31 MED ORDER — AZITHROMYCIN 250 MG PO TABS
500.0000 mg | ORAL_TABLET | Freq: Once | ORAL | Status: AC
  Administered 2013-12-31: 500 mg via ORAL
  Filled 2013-12-31: qty 2

## 2013-12-31 NOTE — ED Notes (Signed)
Patient transported to CT 

## 2013-12-31 NOTE — ED Notes (Signed)
PA request pt o2 and BP while standing. O2 @ 100. Pt states she felt lightheaded and couldn't do it. [facility] the [facility] and PA.

## 2013-12-31 NOTE — ED Notes (Signed)
Pt appears to be more calm. Speaking in full sentences. Breathing unlabored and WNL. PA aware

## 2013-12-31 NOTE — ED Notes (Signed)
PA at bedside.

## 2013-12-31 NOTE — ED Provider Notes (Signed)
CSN: [id number]     Arrival date & time 12/31/13  [id number] History   First MD Initiated Contact with Patient 12/31/13 [phone]     Chief Complaint  Patient presents with  . Chest Pain  . Shortness of Breath     (Consider location/radiation/quality/duration/timing/severity/associated sxs/prior Treatment) HPI   Patient to the ER with complaints of SOB, chest pains and tachypnea.  She has a PMH of asthma, migraine, and ankle fracture to the right foot  43 yo and has been wearing a cam walker boot for plantar fasciitis recently to the right leg.. She has been having increasingly worsening CP and SOB over the past week. [facility] by her PCP within the past week but isn't feeling better and feels as though she is getting worse. The CP is central and described as sharp. The patient is anxious and hyperventilating, she is now light headed. Her temperature, pulse, BP and O2 sats are WNL.   EMS gave 1 nitro PO but she refused a second.  Past Medical History  Diagnosis Date  . Migraine   . Asthma   . Ankle fracture, right 2001  . Plantar fasciitis of right foot    Past Surgical History  Procedure Laterality Date  . Tubal ligation     No family history on file. History  Substance Use Topics  . Smoking status: Former Games developermoker  . Smokeless tobacco: Not on file  . Alcohol Use: No   OB History    No data available     Review of Systems  10 Systems reviewed and are negative for acute change except as noted in the HPI.     Allergies  Aspirin; Penicillins; Percocet; Tramadol; and Vicodin  Home Medications   Prior to Admission medications   Medication Sig Start Date End Date Taking? Authorizing Provider  albuterol (PROVENTIL HFA;VENTOLIN HFA) 108 (90 BASE) MCG/ACT inhaler Inhale 2 puffs into the lungs every 6 (six) hours as needed. Shortness of breath   Yes Historical Provider, MD  albuterol (PROVENTIL) (2.5 MG/3ML) 0.083% nebulizer solution Take 2.5 mg by nebulization every 6  (six) hours as needed for wheezing or shortness of breath.   Yes Historical Provider, MD  benzonatate (TESSALON) 100 MG capsule Take 1 capsule (100 mg total) by mouth every 8 (eight) hours. 12/27/13  Yes Shari A Upstill, PA-C  cyclobenzaprine (FLEXERIL) 10 MG tablet Take 10 mg by mouth 3 (three) times daily.   Yes Historical Provider, MD  Fluticasone-Salmeterol (ADVAIR) 100-50 MCG/DOSE AEPB Inhale 1 puff into the lungs 2 (two) times daily.   Yes Historical Provider, MD  lisinopril-hydrochlorothiazide (PRINZIDE,ZESTORETIC) 20-25 MG per tablet Take 1 tablet by mouth daily.  12/27/13  Yes Historical Provider, MD  montelukast (SINGULAIR) 10 MG tablet Take 10 mg by mouth at bedtime.   Yes Historical Provider, MD  OVER THE COUNTER MEDICATION Take 2 capsules by mouth 3 (three) times daily. Diet Pills   Yes Historical Provider, MD  predniSONE (DELTASONE) 20 MG tablet Take 3 tablets (60 mg total) by mouth daily. 12/27/13  Yes Shari A Upstill, PA-C  acetaminophen-codeine (TYLENOL #3) 300-30 MG per tablet Take 1-2 tablets by mouth every 6 (six) hours as needed for moderate pain. Patient not taking: Reported on 12/27/2013 06/16/13   Hayden Rasmussenavid Mabe, NP  acetaminophen-codeine (TYLENOL #3) 300-30 MG per tablet Take 1 tablet by mouth every 6 (six) hours as needed for moderate pain. Patient not taking: Reported on 12/31/2013 09/19/13   Donnita FallsMercedes Strupp Camprubi-Soms,  PA-C  azithromycin (ZITHROMAX) 250 MG tablet Take 1 tablet (250 mg total) by mouth daily. Take first 2 tablets together, then 1 every day until finished. 12/31/13   Zakaiya Lares Irine SealG Muhammadali Ries, PA-C  meloxicam (MOBIC) 7.5 MG tablet Take 1 tablet (7.5 mg total) by mouth daily. Patient not taking: Reported on 12/27/2013 06/30/13   Teressa LowerVrinda Pickering, NP  traMADol (ULTRAM) 50 MG tablet Take 1 tablet (50 mg total) by mouth every 6 (six) hours as needed. Patient not taking: Reported on 12/27/2013 04/13/13   [name], PA-C  traMADol (ULTRAM) 50 MG tablet Take 1 tablet (50 mg  total) by mouth every 6 (six) hours as needed. Patient not taking: Reported on 12/27/2013 09/19/13   Donnita FallsMercedes Strupp Camprubi-Soms, PA-C   BP 127/67 mmHg  Pulse 68  Temp(Src) 98.5 F (36.9 C) (Oral)  Resp 11  SpO2 97%  LMP 12/18/2013 Physical Exam  Constitutional: She is oriented to person, place, and time. She appears well-developed and well-nourished. She appears distressed.  HENT:  Head: Normocephalic and atraumatic.  Right Ear: External ear normal.  Left Ear: External ear normal.  Nose: Nose normal.  Mouth/Throat: Oropharynx is clear and moist.  Eyes: Pupils are equal, round, and reactive to light.  Neck: Normal range of motion. Neck supple.  Cardiovascular: Normal rate and regular rhythm.   Pulmonary/Chest: Effort normal and breath sounds normal. Tachypnea noted. She has no decreased breath sounds. She has no wheezes. She has no rhonchi. She has no rales.  Abdominal: Soft.  Musculoskeletal:  No lower extremity swelling, right foot immobilized in Cam Walker boot. CR < 3 seconds to all 5 toes.  Neurological: She is alert and oriented to person, place, and time.  Generalized weakness  Skin: Skin is warm and dry.  Nursing note and vitals reviewed.   ED Course  Procedures (including critical care time) Labs Review Labs Reviewed  BASIC METABOLIC PANEL - Abnormal; Notable for the following:    Glucose, Bld 118 (*)    BUN 29 (*)    Creatinine, Ser 1.16 (*)    GFR calc non Af Amer 56 (*)    GFR calc Af Amer 65 (*)    Anion gap 18 (*)    All other components within normal limits  I-STAT CHEM 8, ED - Abnormal; Notable for the following:    Potassium 3.3 (*)    BUN 29 (*)    Creatinine, Ser 1.30 (*)    Glucose, Bld 123 (*)    Calcium, Ion 1.06 (*)    Hemoglobin 15.3 (*)    All other components within normal limits  CBC  PRO B NATRIURETIC PEPTIDE  I-STAT TROPOININ, ED    Imaging Review Ct Angio Chest Pe W/cm &/or Wo Cm  12/31/2013   CLINICAL DATA:  LEFT upper chest  pain with shortness of breath that began 1 day ago  EXAM: CT ANGIOGRAPHY CHEST WITH CONTRAST  TECHNIQUE: Multidetector CT imaging of the chest was performed using the standard protocol during bolus administration of intravenous contrast. Multiplanar CT image reconstructions and MIPs were obtained to evaluate the vascular anatomy.  CONTRAST:  100mL OMNIPAQUE IOHEXOL 350 MG/ML SOLN  COMPARISON:  None.  FINDINGS: Aorta normal caliber without aneurysm or dissection.  Visualized upper abdomen unremarkable.  No thoracic adenopathy.  Pulmonary arterial tree only adequately opacified but grossly patent.  No evidence of pulmonary embolism seen.  Scattered bibasilar atelectasis.  Minimal RIGHT upper lobe infiltrate.  Remaining lungs clear.  No pleural effusion or pneumothorax.  Osseous structures unremarkable.  Review of the MIP images confirms the above findings.  IMPRESSION: No evidence of pulmonary embolism.  Scattered bibasilar atelectasis with minimal RIGHT upper lobe infiltrate.   Electronically Signed   By: Ulyses Southward M.D.   On: 12/31/2013 11:40   Dg Chest Port 1 View  12/31/2013   CLINICAL DATA:  Tachypnea, left-sided chest pain  EXAM: PORTABLE CHEST - 1 VIEW  COMPARISON:  PA and lateral chest December 26, 2011  FINDINGS: The lungs are mildly hypoinflated. There is no focal infiltrate. There is crowding of the pulmonary vascularity. The heart is top-normal in size. The pulmonary vascularity exhibits no cephalization. There is no pleural effusion or pneumothorax. The bony thorax is unremarkable.  IMPRESSION: There is no definite acute cardiopulmonary abnormality but the study is limited due to hypoinflation. A repeat portable study or PA and lateral study with deep inspiration would be useful.   Electronically Signed   By: David  [place]   On: 12/31/2013 10:01     EKG Interpretation None      MDM   Final diagnoses:  Tachypnea  Shortness of breath  Community acquired pneumonia   Patient has given 0.5mg   of IV Ativan for hyperventilation, this has significantly helped and her breathing rate has returned to normal. She still reports not feeling well and is SOB.   Due to SOB and immobilization of lower extremity a CT angio was done, this showed minimal RIGHT upper lobe infiltrate. Her BUN and creatinine were slightly elevated today when compared to yesterday, she admits to not drinking normal amounts of fluids. She was encouraged to  Eat and drink well. Will need to have her renal function rechecked in a a few days by her PCP.  Medications  LORazepam (ATIVAN) injection 0.5 mg (0.5 mg Intravenous Given 12/31/13 0942)  iohexol (OMNIPAQUE) 350 MG/ML injection 100 mL (100 mLs Intravenous Contrast Given 12/31/13 1047)  azithromycin (ZITHROMAX) tablet 500 mg (500 mg Oral Given 12/31/13 1219)    43 y.o.Leza A Diaz-Ramirez's evaluation in the Emergency Department is complete. It has been determined that no acute conditions requiring further emergency intervention are present at this time. The patient/guardian have been advised of the diagnosis and plan. We have discussed signs and symptoms that warrant return to the ED, such as changes or worsening in symptoms.  Vital signs are stable at discharge. Filed Vitals:   12/31/13 1215  BP: 127/67  Pulse: 68  Temp:   Resp: 11    Patient/guardian has voiced understanding and agreed to follow-up with the PCP or specialist.     Dorthula Matas, PA-C 01/01/14 1610  Vida Roller, MD 01/01/14 1539

## 2013-12-31 NOTE — Discharge Instructions (Signed)

## 2013-12-31 NOTE — ED Notes (Addendum)
Pt came from work Pt started having Lightheadness and needed to sit down followed by Chest tightness. Pt states its a "magnet" in her chest. Pt states she had a cough for a while. Pt was seen Thursday a [facility] and Friday with Primary care with came complaint. PT chest tender to touch. Pt was given 1 Nitro. EMS  States pt refused any more nitro.

## 2013-12-31 NOTE — ED Provider Notes (Signed)
The patient is a obese 43 year old female, history of approximately 7 or more days of cough with wheezing and now has developed a left-sided sharp chest pain. 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 Further workup to include CT of the chest  Medical screening examination/treatment/procedure(s) were conducted as a shared visit with non-physician practitioner(s) and myself.  I personally evaluated the patient during the encounter.  Clinical Impression:   Final diagnoses:  Tachypnea  Shortness of breath  Community acquired pneumonia         Vida RollerBrian D Illias Pantano, MD 01/01/14 1539

## 2014-01-15 ENCOUNTER — Telehealth: Payer: Self-pay | Admitting: Cardiology

## 2014-01-15 NOTE — Telephone Encounter (Signed)
Received records from [facility] (Dr Lerry Linerwight Williams) for appointment with Dr Antoine PocheHochrein on 02/12/14.  [facility] for Dr Hochrein's schedule on 02/12/14.  lp

## 2014-01-15 NOTE — Telephone Encounter (Signed)
Closed encounter °

## 2014-01-23 ENCOUNTER — Other Ambulatory Visit: Payer: Self-pay | Admitting: Orthopedic Surgery

## 2014-01-23 DIAGNOSIS — M79671 Pain in right foot: Secondary | ICD-10-CM

## 2014-01-27 ENCOUNTER — Emergency Department (HOSPITAL_COMMUNITY): Payer: Medicaid Other

## 2014-01-27 ENCOUNTER — Emergency Department (HOSPITAL_COMMUNITY)
Admission: EM | Admit: 2014-01-27 | Discharge: 2014-01-27 | Disposition: A | Discharge status: Home / Self Care | Payer: Medicaid Other | Attending: Emergency Medicine | Admitting: Emergency Medicine

## 2014-01-27 ENCOUNTER — Encounter (HOSPITAL_COMMUNITY): Payer: Self-pay | Admitting: Emergency Medicine

## 2014-01-27 DIAGNOSIS — Z8781 Personal history of (healed) traumatic fracture: Secondary | ICD-10-CM | POA: Insufficient documentation

## 2014-01-27 DIAGNOSIS — Z7952 Long term (current) use of systemic steroids: Secondary | ICD-10-CM | POA: Insufficient documentation

## 2014-01-27 DIAGNOSIS — Z880 Allergy status to penicillin: Secondary | ICD-10-CM | POA: Diagnosis not present

## 2014-01-27 DIAGNOSIS — Z8739 Personal history of other diseases of the musculoskeletal system and connective tissue: Secondary | ICD-10-CM | POA: Diagnosis not present

## 2014-01-27 DIAGNOSIS — J45901 Unspecified asthma with (acute) exacerbation: Secondary | ICD-10-CM

## 2014-01-27 DIAGNOSIS — Z791 Long term (current) use of non-steroidal anti-inflammatories (NSAID): Secondary | ICD-10-CM | POA: Insufficient documentation

## 2014-01-27 DIAGNOSIS — R0602 Shortness of breath: Secondary | ICD-10-CM

## 2014-01-27 DIAGNOSIS — G43909 Migraine, unspecified, not intractable, without status migrainosus: Secondary | ICD-10-CM | POA: Insufficient documentation

## 2014-01-27 DIAGNOSIS — Z87891 Personal history of nicotine dependence: Secondary | ICD-10-CM | POA: Insufficient documentation

## 2014-01-27 DIAGNOSIS — R5383 Other fatigue: Secondary | ICD-10-CM | POA: Diagnosis not present

## 2014-01-27 DIAGNOSIS — R609 Edema, unspecified: Secondary | ICD-10-CM | POA: Insufficient documentation

## 2014-01-27 DIAGNOSIS — Z792 Long term (current) use of antibiotics: Secondary | ICD-10-CM | POA: Diagnosis not present

## 2014-01-27 LAB — BASIC METABOLIC PANEL
ANION GAP: 12 (ref 5–15)
BUN: 12 mg/dL (ref 6–23)
CALCIUM: 9.4 mg/dL (ref 8.4–10.5)
CHLORIDE: 103 meq/L (ref 96–112)
CO2: 25 mmol/L (ref 19–32)
CREATININE: 0.96 mg/dL (ref 0.50–1.10)
GFR calc Af Amer: 82 mL/min — ABNORMAL LOW (ref >90)
GFR calc non Af Amer: 70 mL/min — ABNORMAL LOW (ref >90)
GLUCOSE: 90 mg/dL (ref 70–99)
POTASSIUM: 4.1 mmol/L (ref 3.5–5.1)
SODIUM: 140 mmol/L (ref 135–145)

## 2014-01-27 LAB — TROPONIN I: Troponin I: <0.03 ng/mL (ref <0.031)

## 2014-01-27 LAB — CBC
HEMATOCRIT: 38.2 % (ref 36.0–46.0)
Hemoglobin: 12.3 g/dL (ref 12.0–15.0)
MCH: 27.7 pg (ref 26.0–34.0)
MCHC: 32.2 g/dL (ref 30.0–36.0)
MCV: 86 fL (ref 78.0–100.0)
Platelets: 265 10*3/uL (ref 150–400)
RBC: 4.44 MIL/uL (ref 3.87–5.11)
RDW: 15.2 % (ref 11.5–15.5)
WBC: 7.1 10*3/uL (ref 4.0–10.5)

## 2014-01-27 LAB — BRAIN NATRIURETIC PEPTIDE: B NATRIURETIC PEPTIDE 5: 15.5 pg/mL (ref 0.0–100.0)

## 2014-01-27 MED ORDER — PREDNISONE 20 MG PO TABS
60.0000 mg | ORAL_TABLET | Freq: Once | ORAL | Status: AC
  Administered 2014-01-27: 60 mg via ORAL
  Filled 2014-01-27: qty 3

## 2014-01-27 MED ORDER — PREDNISONE 20 MG PO TABS: 40.0000 mg | ORAL_TABLET | Freq: Every day | ORAL | Status: DC

## 2014-01-27 MED ORDER — ALBUTEROL SULFATE (2.5 MG/3ML) 0.083% IN NEBU
5.0000 mg | INHALATION_SOLUTION | Freq: Once | RESPIRATORY_TRACT | Status: AC
  Administered 2014-01-27: 5 mg via RESPIRATORY_TRACT
  Filled 2014-01-27: qty 6

## 2014-01-27 MED ORDER — IPRATROPIUM BROMIDE 0.02 % IN SOLN
0.5000 mg | Freq: Once | RESPIRATORY_TRACT | Status: AC
  Administered 2014-01-27: 0.5 mg via RESPIRATORY_TRACT
  Filled 2014-01-27: qty 2.5

## 2014-01-27 NOTE — ED Provider Notes (Signed)
CSN: [id number]     Arrival date & time 01/27/14  1626 History   First MD Initiated Contact with Patient 01/27/14 1910     Chief Complaint  Patient presents with  . Shortness of Breath  . Asthma  . Fatigue     (Consider location/radiation/quality/duration/timing/severity/associated sxs/prior Treatment) Patient is a 44 y.o. female presenting with shortness of breath and asthma. The history is provided by the patient and medical records. No language interpreter was used.  Shortness of Breath Associated symptoms: no abdominal pain, no chest pain, no cough, no diaphoresis, no fever, no headaches, no rash, no vomiting and no wheezing   Asthma Pertinent negatives include no abdominal pain, chest pain, coughing, diaphoresis, fatigue, fever, headaches, nausea, rash or vomiting.     Melanie Hess is a 44 y.o. female  with a hx of asthma presents to the Emergency Department complaining of gradual, persistent, progressively worsening asthma attack onset 11am.  Pt reports increasing SOB with fatigue for the last several days.  Pt reports she develops SOB walking up stairs and after taking more than 11+ steps.  Pt reports 2 albuterol breathing treatments without improvement.  Pt was treated for CAP on 12/31/13 and reports persistent cough with this.  Pt reports cough continues to be productive.  Associated symptoms include swelling in her feet persistent for several weeks along with orthopnea and PND.   I think seems to make her symptoms better or worse.  Pt denies fever chills, headache, neck pain, chest pain, abd pain, N/V/D, weakness, dizziness, syncope.    Past Medical History  Diagnosis Date  . Migraine   . Asthma   . Ankle fracture, right 2001  . Plantar fasciitis of right foot    Past Surgical History  Procedure Laterality Date  . Tubal ligation     No family history on file. History  Substance Use Topics  . Smoking status: Former Games developer  . Smokeless tobacco: Not on file  .  Alcohol Use: No   OB History    No data available     Review of Systems  Constitutional: Negative for fever, diaphoresis, appetite change, fatigue and unexpected weight change.  HENT: Negative for mouth sores.   Eyes: Negative for visual disturbance.  Respiratory: Positive for chest tightness and shortness of breath. Negative for cough and wheezing.   Cardiovascular: Positive for leg swelling. Negative for chest pain.  Gastrointestinal: Negative for nausea, vomiting, abdominal pain, diarrhea and constipation.  Endocrine: Negative for polydipsia, polyphagia and polyuria.  Genitourinary: Negative for dysuria, urgency, frequency and hematuria.  Musculoskeletal: Negative for back pain and neck stiffness.  Skin: Negative for rash.  Allergic/Immunologic: Negative for immunocompromised state.  Neurological: Negative for syncope, light-headedness and headaches.  Hematological: Does not bruise/bleed easily.  Psychiatric/Behavioral: Negative for sleep disturbance. The patient is not nervous/anxious.       Allergies  Aspirin; Penicillins; Percocet; Tramadol; and Vicodin  Home Medications   Prior to Admission medications   Medication Sig Start Date End Date Taking? Authorizing Provider  albuterol (PROVENTIL HFA;VENTOLIN HFA) 108 (90 BASE) MCG/ACT inhaler Inhale 2 puffs into the lungs every 6 (six) hours as needed. Shortness of breath   Yes Historical Provider, MD  albuterol (PROVENTIL) (2.5 MG/3ML) 0.083% nebulizer solution Take 2.5 mg by nebulization every 6 (six) hours as needed for wheezing or shortness of breath.   Yes Historical Provider, MD  lisinopril-hydrochlorothiazide (PRINZIDE,ZESTORETIC) 20-25 MG per tablet Take 1 tablet by mouth daily.  12/27/13  Yes Historical Provider, MD  montelukast (SINGULAIR) 10 MG tablet Take 10 mg by mouth at bedtime.   Yes Historical Provider, MD  acetaminophen-codeine (TYLENOL #3) 300-30 MG per tablet Take 1-2 tablets by mouth every 6 (six) hours as  needed for moderate pain. Patient not taking: Reported on 12/27/2013 06/16/13   Hayden Rasmussen, NP  acetaminophen-codeine (TYLENOL #3) 300-30 MG per tablet Take 1 tablet by mouth every 6 (six) hours as needed for moderate pain. Patient not taking: Reported on 12/31/2013 09/19/13   Donnita Falls Camprubi-Soms, PA-C  azithromycin (ZITHROMAX) 250 MG tablet Take 1 tablet (250 mg total) by mouth daily. Take first 2 tablets together, then 1 every day until finished. 12/31/13   Tiffany Irine Seal, PA-C  meloxicam (MOBIC) 7.5 MG tablet Take 1 tablet (7.5 mg total) by mouth daily. Patient not taking: Reported on 12/27/2013 06/30/13   Teressa Lower, NP  predniSONE (DELTASONE) 20 MG tablet Take 2 tablets (40 mg total) by mouth daily. 01/27/14   Dalayah Deahl, PA-C  traMADol (ULTRAM) 50 MG tablet Take 1 tablet (50 mg total) by mouth every 6 (six) hours as needed. Patient not taking: Reported on 12/27/2013 04/13/13   Phill Mutter Dammen, PA-C  traMADol (ULTRAM) 50 MG tablet Take 1 tablet (50 mg total) by mouth every 6 (six) hours as needed. Patient not taking: Reported on 12/27/2013 09/19/13   Donnita Falls Camprubi-Soms, PA-C   BP 131/71 mmHg  Pulse 76  Temp(Src) 98.9 F (37.2 C) (Oral)  Resp 16  Ht  (1.651 m)  Wt 360 lb (163.295 kg)  BMI 59.91 kg/m2  SpO2 99%  LMP 12/21/2013 Physical Exam  Constitutional: She is oriented to person, place, and time. She appears well-developed and well-nourished. No distress.  Awake, alert, nontoxic appearance Morbidly obese  HENT:  Head: Normocephalic and atraumatic.  Right Ear: Tympanic membrane, external ear and ear canal normal.  Left Ear: Tympanic membrane, external ear and ear canal normal.  Nose: Mucosal edema and rhinorrhea present. No epistaxis. Right sinus exhibits no maxillary sinus tenderness and no frontal sinus tenderness. Left sinus exhibits no maxillary sinus tenderness and no frontal sinus tenderness.  Mouth/Throat: Uvula is midline, oropharynx is  clear and moist and mucous membranes are normal. Mucous membranes are not pale and not cyanotic. No oropharyngeal exudate, posterior oropharyngeal edema, posterior oropharyngeal erythema or tonsillar abscesses.  Eyes: Conjunctivae are normal. Pupils are equal, round, and reactive to light. No scleral icterus.  Neck: Normal range of motion and full passive range of motion without pain. Neck supple.  Cardiovascular: Normal rate, regular rhythm, normal heart sounds and intact distal pulses.   Pulmonary/Chest: Effort normal. No stridor. No respiratory distress. She has decreased breath sounds. She has no wheezes. She exhibits tenderness.  Clear and equal breath sounds without focal wheezes, rhonchi, rales; diminished in the bases Tenderness to palpation of the left anterior chest wall  Abdominal: Soft. Bowel sounds are normal. She exhibits no distension and no mass. There is no tenderness. There is no rebound and no guarding.  Obese Soft and nontender  Musculoskeletal: Normal range of motion. She exhibits no edema.  No pitting edema noted to the feet  Lymphadenopathy:    She has no cervical adenopathy.  Neurological: She is alert and oriented to person, place, and time.  Speech is clear and goal oriented Moves extremities without ataxia  Skin: Skin is warm and dry. No rash noted. She is not diaphoretic. No erythema.  Psychiatric: She has a normal mood and affect.  Nursing note and  vitals reviewed.   ED Course  Procedures (including critical care time) Labs Review Labs Reviewed  BASIC METABOLIC PANEL - Abnormal; Notable for the following:    GFR calc non Af Amer 70 (*)    GFR calc Af Amer 82 (*)    All other components within normal limits  CBC  BRAIN NATRIURETIC PEPTIDE  TROPONIN I    Imaging Review Dg Chest 2 View (if Patient Has Fever And/or Copd)  01/27/2014   CLINICAL DATA:  Shortness of breath, midsternal chest pain, productive cough for 1.5 months  EXAM: CHEST  2 VIEW   COMPARISON:  CT chest 12/31/2013  FINDINGS: The heart size and mediastinal contours are within normal limits. Both lungs are clear. The visualized skeletal structures are unremarkable.  IMPRESSION: No active cardiopulmonary disease.   Electronically Signed   By: Elige KoHetal  Patel   On: 01/27/2014 18:05     EKG Interpretation None      MDM   Final diagnoses:  Shortness of breath  Other fatigue  Asthma exacerbation   Melanie Hess presents with gradually worsening shortness of breath over the last several weeks after being diagnosed with a pneumonia and treated. She reports an asthma attack earlier today which she treated herself with an albuterol treatment and reports improvement in her breathing but fatigue since that time. She's had no fevers or chills to suggest worsening infection. She was evaluated on 01/01/2014 for the same symptoms including reproducible left-sided chest wall pain. At that time she was wearing a boot on her leg and there was concern for pulmonary embolism. CT angiography of the chest showed no PE and patient was discharged home.  9:32 PM Labs today are unchanged or improved from her evaluation on 01/01/2014.  She reports slowly increasing shortness of breath but there are no EKG changes, no elevated BNP, no pitting edema of the lower extremities.  Patient shortness of breath possibly due to patient's weight and asthma.  -year-old treatment pending.  10:30PM Patient reports feeling significantly better after albuterol, Atrovent and prednisone. Her lungs remain clear and equal in her labs are unchanged. I do not believe the patient is experiencing an acute coronary syndrome. She recently had a CT scan without evidence of pulmonary embolism on a do not believe that she is suffering from one of these today. Patient will be discharged home with close follow-up with pulmonology and primary care.  She ambulates here in the emergency department without hypoxia.  I have  personally reviewed patient's vitals, nursing note and any pertinent labs or imaging.  I performed an undressed physical exam.    It has been determined that no acute conditions requiring further emergency intervention are present at this time. The patient/guardian have been advised of the diagnosis and plan. I reviewed all labs and imaging including any potential incidental findings. We have discussed signs and symptoms that warrant return to the ED and they are listed in the discharge instructions.    Vital signs are stable at discharge.   BP 131/71 mmHg  Pulse 76  Temp(Src) 98.9 F (37.2 C) (Oral)  Resp 16  Ht 5\' 5"  (1.651 m)  Wt 360 lb (163.295 kg)  BMI 59.91 kg/m2  SpO2 99%  LMP 12/21/2013   The patient was discussed with and seen by Dr. Rubin PayorPickering who agrees with the treatment plan.        Dahlia ClientHannah Nihal Marzella, PA-C 01/28/14 0244  Juliet RudeNathan R. Rubin PayorPickering, MD 01/30/14 0700

## 2014-01-27 NOTE — ED Notes (Signed)
EKG completed in triage.

## 2014-01-27 NOTE — Discharge Instructions (Signed)
1. Medications: prednisone, usual home medications 2. Treatment: rest, drink plenty of fluids,  3. Follow Up: Please followup with your primary doctor in 3 days for discussion of your diagnoses and further evaluation after today's visit; if you do not have a primary care doctor use the resource guide provided to find one; Please return to the ER for worsening symptoms. Please also follow-up with [name] pulmonology.    Asthma, Acute Bronchospasm Acute bronchospasm caused by asthma is also referred to as an asthma attack. Bronchospasm means your air passages become narrowed. The narrowing is caused by inflammation and tightening of the muscles in the air tubes (bronchi) in your lungs. This can make it hard to breathe or cause you to wheeze and cough. CAUSES Possible triggers are:  Animal dander from the skin, hair, or feathers of animals.  Dust mites contained in house dust.  Cockroaches.  Pollen from trees or grass.  Mold.  Cigarette or tobacco smoke.  Air pollutants such as dust, household cleaners, hair sprays, aerosol sprays, paint fumes, strong chemicals, or strong odors.  Cold air or weather changes. Cold air may trigger inflammation. Winds increase molds and pollens in the air.  Strong emotions such as crying or laughing hard.  Stress.  Certain medicines such as aspirin or beta-blockers.  Sulfites in foods and drinks, such as dried fruits and wine.  Infections or inflammatory conditions, such as a flu, cold, or inflammation of the nasal membranes (rhinitis).  Gastroesophageal reflux disease (GERD). GERD is a condition where stomach acid backs up into your esophagus.  Exercise or strenuous activity. SIGNS AND SYMPTOMS   Wheezing.  Excessive coughing, particularly at night.  Chest tightness.  Shortness of breath. DIAGNOSIS  Your health care provider will ask you about your medical history and perform a physical exam. A chest X-ray or blood testing may be  performed to look for other causes of your symptoms or other conditions that may have triggered your asthma attack. TREATMENT  Treatment is aimed at reducing inflammation and opening up the airways in your lungs. Most asthma attacks are treated with inhaled medicines. These include quick relief or rescue medicines (such as bronchodilators) and controller medicines (such as inhaled corticosteroids). These medicines are sometimes given through an inhaler or a nebulizer. Systemic steroid medicine taken by mouth or given through an IV tube also can be used to reduce the inflammation when an attack is moderate or severe. Antibiotic medicines are only used if a bacterial infection is present.  HOME CARE INSTRUCTIONS   Rest.  Drink plenty of liquids. This helps the mucus to remain thin and be easily coughed up. Only use caffeine in moderation and do not use alcohol until you have recovered from your illness.  Do not smoke. Avoid being exposed to secondhand smoke.  You play a critical role in keeping yourself in good health. Avoid exposure to things that cause you to wheeze or to have breathing problems.  Keep your medicines up-to-date and available. Carefully follow your health care provider's treatment plan.  Take your medicine exactly as prescribed.  When pollen or pollution is bad, keep windows closed and use an air conditioner or go to places with air conditioning.  Asthma requires careful medical care. See your health care provider for a follow-up as advised. If you are more than [redacted] weeks pregnant and you were prescribed any new medicines, let your obstetrician know about the visit and how you are doing. Follow up with your health care provider as directed.  After you have recovered from your asthma attack, make an appointment with your outpatient doctor to talk about ways to reduce the likelihood of future attacks. If you do not have a doctor who manages your asthma, make an appointment with a  primary care doctor to discuss your asthma. SEEK IMMEDIATE MEDICAL CARE IF:   You are getting worse.  You have trouble breathing. If severe, call your local emergency services (911 in the [place]  You develop chest pain or discomfort.  You are vomiting.  You are not able to keep fluids down.  You are coughing up yellow, green, brown, or bloody sputum.  You have a fever and your symptoms suddenly get worse.  You have trouble swallowing. MAKE SURE YOU:   Understand these instructions.  Will watch your condition.  Will get help right away if you are not doing well or get worse. Document Released: 04/21/2006 Document Revised: 01/09/2013 Document Reviewed: 07/12/2012 [facility] Patient Information 2015 [employer] [place]. This information is not intended to replace advice given to you by your health care provider. Make sure you discuss any questions you have with your health care provider.

## 2014-01-27 NOTE — ED Notes (Signed)
Pt c/o shortness of breath and having asthma attacks. Pt recently diagnosed with pneumonia. Pt took a neb treatment earlier and has been feeling fatigue since.

## 2014-02-01 ENCOUNTER — Ambulatory Visit
Admission: RE | Admit: 2014-02-01 | Discharge: 2014-02-01 | Disposition: A | Discharge status: Home / Self Care | Payer: Medicaid Other | Source: Ambulatory Visit | Attending: Orthopedic Surgery | Admitting: Orthopedic Surgery

## 2014-02-01 DIAGNOSIS — M79671 Pain in right foot: Secondary | ICD-10-CM

## 2014-02-12 ENCOUNTER — Ambulatory Visit: Payer: Medicaid Other | Admitting: Cardiology

## 2014-02-14 ENCOUNTER — Encounter (INDEPENDENT_AMBULATORY_CARE_PROVIDER_SITE_OTHER): Payer: Self-pay

## 2014-02-14 ENCOUNTER — Ambulatory Visit (INDEPENDENT_AMBULATORY_CARE_PROVIDER_SITE_OTHER): Payer: Medicaid Other | Admitting: Internal Medicine

## 2014-02-14 ENCOUNTER — Encounter: Payer: Self-pay | Admitting: Internal Medicine

## 2014-02-14 VITALS — BP 148/90 | HR 89 | Ht 65.0 in | Wt 372.0 lb

## 2014-02-14 DIAGNOSIS — R0600 Dyspnea, unspecified: Secondary | ICD-10-CM | POA: Insufficient documentation

## 2014-02-14 DIAGNOSIS — R059 Cough, unspecified: Secondary | ICD-10-CM | POA: Insufficient documentation

## 2014-02-14 DIAGNOSIS — R05 Cough: Secondary | ICD-10-CM

## 2014-02-14 DIAGNOSIS — R0689 Other abnormalities of breathing: Secondary | ICD-10-CM

## 2014-02-14 MED ORDER — LOSARTAN POTASSIUM 25 MG PO TABS: 25.0000 mg | ORAL_TABLET | Freq: Every day | ORAL | Status: DC

## 2014-02-14 NOTE — Addendum Note (Signed)
Addended by: Nicanor AlconNAGLE, Jaylin Roundy K on: 02/14/2014 10:15 AM   Modules accepted: Orders, Medications

## 2014-02-14 NOTE — Progress Notes (Signed)
Subjective:    Patient ID: Melanie Hess, female    DOB: 1970/09/19, 44 y.o.   MRN: [id number]  PCP No PCP Per Patient   HPI   IOV 02/14/2014  Chief Complaint  Patient presents with  . Pulmonary Consult    Pt here as a self referral. Pt stated it was suggested by the ED physician to follow up with pulmonary for asthma and SOB with any activity.    44 year old obese female morbidly obese with a BMI of 61, nonsmoker, stable weight of 370 pounds for many years reports insidious onset of dyspnea for the last several months. It is stable since onset. It is moderate in severity. Present on exertion for flight of stairs or bending or doing any relatively heavy exertion. Relieved by rest. It is associated with cough and sometimes with wheezing. Superimposed on this is a strong history of clinical asthma since age 57 details of diagnosis unknown. As was always been episodic without any emergency room visits or urgent care visits or prednisone for her asthma. But in late 2015 has had 2 emergency room visits for shortness of breath and treated acutely for asthma exacerbations pulmonary embolus and was ruled out. She is currently on a prednisone taper for 5 days starting 02/02/2014. She has 4 more days left on pill count and the math does not add up because she denies startin it yesterday only.  Of note, she is on ACE inhibitor since middle of January 2016 but she does not notice any change in her symptoms. She is also on Singulair.There is hx of edema   CT chest Dec 2015 - PE ruled out. Air trapping + in my opinion. Obese +  CXR 01/27/14 - clear lung fields  Spirometry todaytoday shows restriction consistent with obesity  Walking desaturation test 185 feet 3 laps on room air: got dyspneic but did not desaturate. HR 115 peak    has a past medical history of Migraine; Asthma; Ankle fracture, right (2001); Plantar fasciitis of right foot; and Hypertension.   reports that she quit smoking  about 20 years ago. Her smoking use included Cigarettes. She has a .01 pack-year smoking history. She has never used smokeless tobacco.  Past Surgical History  Procedure Laterality Date  . Tubal ligation      Allergies  Allergen Reactions  . Aspirin Swelling  . Penicillins Swelling  . Percocet [Oxycodone-Acetaminophen] Hives and Itching  . Tramadol Nausea Only  . Vicodin [Hydrocodone-Acetaminophen] Hives and Itching     There is no immunization history on file for this patient.  Family History  Problem Relation Age of Onset  . Breast cancer Maternal Aunt   . Brain cancer Cousin      Current outpatient prescriptions:  .  albuterol (PROVENTIL HFA;VENTOLIN HFA) 108 (90 BASE) MCG/ACT inhaler, Inhale 2 puffs into the lungs every 6 (six) hours as needed. Shortness of breath, Disp: , Rfl:  .  albuterol (PROVENTIL) (2.5 MG/3ML) 0.083% nebulizer solution, Take 2.5 mg by nebulization every 6 (six) hours as needed for wheezing or shortness of breath., Disp: , Rfl:  .  lisinopril-hydrochlorothiazide (PRINZIDE,ZESTORETIC) 20-25 MG per tablet, Take 1 tablet by mouth daily. , Disp: , Rfl: 5 .  montelukast (SINGULAIR) 10 MG tablet, Take 10 mg by mouth at bedtime., Disp: , Rfl:  .  predniSONE (DELTASONE) 20 MG tablet, Take 2 tablets (40 mg total) by mouth daily., Disp: 10 tablet, Rfl: 0 .  acetaminophen-codeine (TYLENOL #3) 300-30 MG per tablet, Take 1-2  tablets by mouth every 6 (six) hours as needed for moderate pain. (Patient not taking: Reported on 12/27/2013), Disp: 20 tablet, Rfl: 0     Review of Systems  Constitutional: Negative for fever and unexpected weight change.  HENT: Negative for congestion, dental problem, ear pain, nosebleeds, postnasal drip, rhinorrhea, sinus pressure, sneezing, sore throat and trouble swallowing.   Eyes: Negative for redness and itching.  Respiratory: Positive for cough, chest tightness and shortness of breath. Negative for wheezing.   Cardiovascular:  Negative for palpitations and leg swelling.  Gastrointestinal: Positive for abdominal pain. Negative for nausea and vomiting.  Genitourinary: Negative for dysuria.  Musculoskeletal: Negative for joint swelling.  Skin: Negative for rash.  Neurological: Negative for headaches.  Hematological: Does not bruise/bleed easily.  Psychiatric/Behavioral: Negative for dysphoric mood. The patient is not nervous/anxious.        Objective:   Physical Exam  Constitutional: She is oriented to person, place, and time. She appears well-developed and well-nourished. No distress.  Morbidly obese  HENT:  Head: Normocephalic and atraumatic.  Right Ear: External ear normal.  Left Ear: External ear normal.  Mouth/Throat: Oropharynx is clear and moist. No oropharyngeal exudate.  Eyes: Conjunctivae and EOM are normal. Pupils are equal, round, and reactive to light. Right eye exhibits no discharge. Left eye exhibits no discharge. No scleral icterus.  Neck: Normal range of motion. Neck supple. No JVD present. No tracheal deviation present. No thyromegaly present.  Cardiovascular: Normal rate, regular rhythm, normal heart sounds and intact distal pulses.  Exam reveals no gallop and no friction rub.   No murmur heard. Pulmonary/Chest: Effort normal and breath sounds normal. No respiratory distress. She has no wheezes. She has no rales. She exhibits no tenderness.  Abdominal: Soft. Bowel sounds are normal. She exhibits no distension and no mass. There is no tenderness. There is no rebound and no guarding.  Musculoskeletal: Normal range of motion. She exhibits edema. She exhibits no tenderness.  Chronic edema +  Lymphadenopathy:    She has no cervical adenopathy.  Neurological: She is alert and oriented to person, place, and time. She has normal reflexes. No cranial nerve deficit. She exhibits normal muscle tone. Coordination normal.  Skin: Skin is warm and dry. No rash noted. She is not diaphoretic. No erythema. No  pallor.  Psychiatric: She has a normal mood and affect. Her behavior is normal. Judgment and thought content normal.  Vitals reviewed.   Filed Vitals:   02/14/14 0928  BP: 148/90  Pulse: 89  Height:  (1.651 m)  Weight: 372 lb (168.738 kg)  SpO2: 98%    Estimated body mass index is 61.9 kg/(m^2) as calculated from the following:   Height as of this encounter:  (1.651 m).   Weight as of this encounter: 372 lb (168.738 kg).      Assessment & Plan:     ICD-9-CM ICD-10-CM   1. Dyspnea and respiratory abnormality 786.09 R06.00 Pulmonary function test    R06.89 2D Echocardiogram without contrast  2. Cough 786.2 R05 Spirometry with Graph     Pulmonary function test     2D Echocardiogram without contrast   Need to first establish if she has true asthma or not. All current meds interfering with this assessment i (pred, singulair, lisinopril) Need to rule out systolic v diast chf  PLAN  Stop prednisone, singulair, lisinopril Start losartan  per day Continue albuterol 2 puff prn  Do ECHO test anytime Do methacholine challenge test - greater than  7 days from now   Followup - after above tests - next 3 weeks with my NP Tammy or myself  -call or return sooner if symptoms worsen in interim - CPST test depending on above results   She is agreeable with plan   Dr. Kalman Shan, M.D., [facility].C.P Pulmonary and Critical Care Medicine Staff Physician [facility] [place] [facility] Pager: [phone], If no answer or between  15:00h - 7:00h: call [phone]  02/14/2014 10:09 AM

## 2014-02-14 NOTE — Patient Instructions (Addendum)
ICD-9-CM ICD-10-CM   1. Dyspnea and respiratory abnormality 786.09 R06.00     R06.89   2. Cough 786.2 R05 Spirometry with Graph    Stop prednisone, singulair, lisinopril Start losartan 25mg  per day Continue albuterol 2 puff prn  Do ECHO test anytime Do methacholine challenge test - greater than 7 days from now   Followup - after above tests - next 3 weeks with my NP Tammy or myself  -call or return sooner if symptoms worsen in interim

## 2014-02-19 ENCOUNTER — Other Ambulatory Visit (HOSPITAL_COMMUNITY): Payer: Medicaid Other

## 2014-02-20 ENCOUNTER — Encounter (HOSPITAL_COMMUNITY): Payer: Self-pay | Admitting: Internal Medicine

## 2014-02-22 ENCOUNTER — Encounter (HOSPITAL_COMMUNITY): Payer: Medicaid Other

## 2014-02-22 ENCOUNTER — Ambulatory Visit: Payer: Medicaid Other | Admitting: Cardiology

## 2014-02-26 ENCOUNTER — Ambulatory Visit (HOSPITAL_COMMUNITY)
Admission: RE | Admit: 2014-02-26 | Discharge: 2014-02-26 | Disposition: A | Discharge status: Home / Self Care | Payer: Medicaid Other | Source: Ambulatory Visit | Attending: Internal Medicine | Admitting: Internal Medicine

## 2014-02-26 DIAGNOSIS — R0600 Dyspnea, unspecified: Secondary | ICD-10-CM | POA: Diagnosis not present

## 2014-02-26 DIAGNOSIS — R05 Cough: Secondary | ICD-10-CM | POA: Diagnosis not present

## 2014-02-26 DIAGNOSIS — R0689 Other abnormalities of breathing: Secondary | ICD-10-CM

## 2014-02-26 DIAGNOSIS — R059 Cough, unspecified: Secondary | ICD-10-CM

## 2014-02-26 LAB — PULMONARY FUNCTION TEST
FEF 25-75 POST: 0.3 L/s
FEF 25-75 Pre: 1.35 L/sec
FEF2575-%CHANGE-POST: -78 %
FEF2575-%PRED-PRE: 49 %
FEF2575-%Pred-Post: 10 %
FEV1-%Change-Post: -24 %
FEV1-%PRED-POST: 53 %
FEV1-%PRED-PRE: 71 %
FEV1-POST: 1.4 L
FEV1-Pre: 1.87 L
FEV1FVC-%CHANGE-POST: -6 %
FEV1FVC-%Pred-Pre: 90 %
FEV6-%CHANGE-POST: -20 %
FEV6-%PRED-PRE: 80 %
FEV6-%Pred-Post: 64 %
FEV6-PRE: 2.52 L
FEV6-Post: 2.01 L
FEV6FVC-%Change-Post: 0 %
FEV6FVC-%PRED-POST: 101 %
FEV6FVC-%Pred-Pre: 101 %
FVC-%Change-Post: -19 %
FVC-%Pred-Post: 63 %
FVC-%Pred-Pre: 78 %
FVC-PRE: 2.52 L
FVC-Post: 2.02 L
POST FEV6/FVC RATIO: 99 %
PRE FEV6/FVC RATIO: 100 %
Post FEV1/FVC ratio: 69 %
Pre FEV1/FVC ratio: 74 %

## 2014-02-26 MED ORDER — METHACHOLINE 0.0625 MG/ML NEB SOLN
2.0000 mL | Freq: Once | RESPIRATORY_TRACT | Status: AC
  Administered 2014-02-26: 0.125 mg via RESPIRATORY_TRACT

## 2014-02-26 MED ORDER — METHACHOLINE 16 MG/ML NEB SOLN
2.0000 mL | Freq: Once | RESPIRATORY_TRACT | Status: AC
  Administered 2014-02-26: 32 mg via RESPIRATORY_TRACT

## 2014-02-26 MED ORDER — SODIUM CHLORIDE 0.9 % IN NEBU
3.0000 mL | INHALATION_SOLUTION | Freq: Once | RESPIRATORY_TRACT | Status: AC
  Administered 2014-02-26: 3 mL via RESPIRATORY_TRACT

## 2014-02-26 MED ORDER — METHACHOLINE 4 MG/ML NEB SOLN
2.0000 mL | Freq: Once | RESPIRATORY_TRACT | Status: AC
  Administered 2014-02-26: 8 mg via RESPIRATORY_TRACT

## 2014-02-26 MED ORDER — METHACHOLINE 0.25 MG/ML NEB SOLN
2.0000 mL | Freq: Once | RESPIRATORY_TRACT | Status: AC
  Administered 2014-02-26: 0.5 mg via RESPIRATORY_TRACT

## 2014-02-26 MED ORDER — METHACHOLINE 1 MG/ML NEB SOLN
2.0000 mL | Freq: Once | RESPIRATORY_TRACT | Status: AC
  Administered 2014-02-26: 2 mg via RESPIRATORY_TRACT

## 2014-02-26 MED ORDER — ALBUTEROL SULFATE (2.5 MG/3ML) 0.083% IN NEBU
2.5000 mg | INHALATION_SOLUTION | Freq: Once | RESPIRATORY_TRACT | Status: AC
  Administered 2014-02-26: 2.5 mg via RESPIRATORY_TRACT

## 2014-02-28 ENCOUNTER — Ambulatory Visit (INDEPENDENT_AMBULATORY_CARE_PROVIDER_SITE_OTHER): Payer: Self-pay | Admitting: Internal Medicine

## 2014-02-28 ENCOUNTER — Encounter: Payer: Self-pay | Admitting: Internal Medicine

## 2014-02-28 ENCOUNTER — Other Ambulatory Visit (HOSPITAL_COMMUNITY): Payer: Self-pay | Admitting: Internal Medicine

## 2014-02-28 VITALS — BP 140/92 | HR 61 | Ht 66.0 in | Wt 381.4 lb

## 2014-02-28 DIAGNOSIS — R0689 Other abnormalities of breathing: Secondary | ICD-10-CM

## 2014-02-28 DIAGNOSIS — R5383 Other fatigue: Secondary | ICD-10-CM

## 2014-02-28 DIAGNOSIS — J45901 Unspecified asthma with (acute) exacerbation: Secondary | ICD-10-CM

## 2014-02-28 DIAGNOSIS — R0600 Dyspnea, unspecified: Secondary | ICD-10-CM

## 2014-02-28 DIAGNOSIS — R05 Cough: Secondary | ICD-10-CM

## 2014-02-28 DIAGNOSIS — R059 Cough, unspecified: Secondary | ICD-10-CM

## 2014-02-28 DIAGNOSIS — J45909 Unspecified asthma, uncomplicated: Secondary | ICD-10-CM | POA: Insufficient documentation

## 2014-02-28 DIAGNOSIS — R0609 Other forms of dyspnea: Secondary | ICD-10-CM

## 2014-02-28 DIAGNOSIS — I10 Essential (primary) hypertension: Secondary | ICD-10-CM

## 2014-02-28 DIAGNOSIS — G4719 Other hypersomnia: Secondary | ICD-10-CM

## 2014-02-28 NOTE — Patient Instructions (Addendum)
Please schedule your echocardiogram [facility] ([address])   Your physician has recommended that you have a sleep study. This test records several body functions during sleep, including: brain activity, eye movement, oxygen and carbon dioxide blood levels, heart rate and rhythm, breathing rate and rhythm, the flow of air through your mouth and nose, snoring, body muscle movements, and chest and belly movement. [facility])  Your physician recommends that you schedule a follow-up appointment as needed. We will call you with the results of these tests.

## 2014-02-28 NOTE — Progress Notes (Signed)
OFFICE NOTE  Chief Complaint:  Dyspnea  Primary Care Physician: Pcp Not In System  HPI:  [name] is a pleasant 44 year old female, referred to me by Dr. Mayford KnifeWilliams. She's had numerous recent emergency department visits for asthma. She has wheezing and shortness of breath for which she receives nebulizers and steroids and has improvement. She recently saw Dr. Marchelle Gearingamaswamy in pulmonary critical care who recommended a number of tests including an echocardiogram. That has not yet been performed. There is some concern that this may not all be related to asthma. She does have a history of hypertension and morbid obesity. She reports some chest wall tenderness which is reproducible to palpation but no anginal chest pain.  PMHx:  Past Medical History  Diagnosis Date  . Migraine   . Asthma   . Ankle fracture, right 2001  . Plantar fasciitis of right foot   . Hypertension     Past Surgical History  Procedure Laterality Date  . Tubal ligation      FAMHx:  Family History  Problem Relation Age of Onset  . Breast cancer Maternal Aunt   . Brain cancer Cousin     SOCHx:   reports that she quit smoking about 20 years ago. Her smoking use included Cigarettes. She has a .01 pack-year smoking history. She has never used smokeless tobacco. She reports that she does not drink alcohol or use illicit drugs.  ALLERGIES:  Allergies  Allergen Reactions  . Aspirin Swelling  . Penicillins Swelling  . Percocet [Oxycodone-Acetaminophen] Hives and Itching  . Tramadol Nausea Only  . Vicodin [Hydrocodone-Acetaminophen] Hives and Itching    ROS: A comprehensive review of systems was negative except for: Respiratory: positive for asthma and dyspnea on exertion Cardiovascular: positive for chest pain  HOME MEDS: Current Outpatient Prescriptions  Medication Sig Dispense Refill  . acetaminophen-codeine (TYLENOL #3) 300-30 MG per tablet Take 1-2 tablets by mouth every 6 (six) hours as  needed for moderate pain. 20 tablet 0  . albuterol (PROVENTIL HFA;VENTOLIN HFA) 108 (90 BASE) MCG/ACT inhaler Inhale 2 puffs into the lungs every 6 (six) hours as needed. Shortness of breath    . albuterol (PROVENTIL) (2.5 MG/3ML) 0.083% nebulizer solution Take 2.5 mg by nebulization every 6 (six) hours as needed for wheezing or shortness of breath.    . losartan (COZAAR) 25 MG tablet Take 1 tablet (25 mg total) by mouth daily. 30 tablet 5  . montelukast (SINGULAIR) 10 MG tablet Take 10 mg by mouth at bedtime.    . predniSONE (DELTASONE) 20 MG tablet Take 2 tablets (40 mg total) by mouth daily. 10 tablet 0   No current facility-administered medications for this visit.    LABS/IMAGING: No results found for this or any previous visit (from the past 48 hour(s)). No results found.  VITALS: BP 140/92 mmHg  Pulse 61  Ht 5\' 6"  (1.676 m)  Wt 381 lb 6.4 oz (173.002 kg)  BMI 61.59 kg/m2  EXAM: General appearance: alert, no distress and morbidly obese Neck: no carotid bruit and no JVD Lungs: clear to auscultation bilaterally Heart: regular rate and rhythm, S1, S2 normal, no murmur, click, rub or gallop Abdomen: soft, non-tender; bowel sounds normal; no masses,  no organomegaly Extremities: edema trace pedal, varicose veins noted and venous stasis dermatitis noted Pulses: 2+ and symmetric Skin: Skin color, texture, turgor normal. No rashes or lesions Neurologic: Grossly normal Psych: Pleasant  EKG: Normal sinus rhythm at 61  ASSESSMENT: 1. Asthma with frequent  exacerbations 2. Dyspnea on exertion 3. Morbid obesity 4. Hypertension  PLAN: 1.   Melanie Hess has had frequent ER visits with exacerbations of asthma. No clear cause of her asthma has been elucidated. She does have morbid obesity and hypertension and could feasibly have some element of cardiomyopathy. She does not seem to have chronic dyspnea, orthopnea, PND or other heart failure symptoms. She has mild lower extremity  swelling but does have varicose veins. I would recommend an echocardiogram to evaluate for cardiomyopathy, but I think this is unlikely. She'll need to continue to work on risk factor modification with regards to her asthma and avoiding triggers. She does report very poor sleep at night and has a high Epworth sleepiness scale score of 15. Given her body habitus, sleep apnea is likely. I like to refer her for an a sleep study.  Thanks for the kind referral.  Chrystie Nose, MD, [facility] Attending Cardiologist [facility]  Khaleef Ruby C 02/28/2014, 4:39 PM

## 2014-03-04 ENCOUNTER — Ambulatory Visit (HOSPITAL_BASED_OUTPATIENT_CLINIC_OR_DEPARTMENT_OTHER): Payer: Medicaid Other | Attending: Internal Medicine

## 2014-03-07 ENCOUNTER — Ambulatory Visit: Payer: Medicaid Other | Admitting: Adult Health

## 2014-03-11 ENCOUNTER — Ambulatory Visit (HOSPITAL_COMMUNITY): Payer: Self-pay

## 2014-03-24 ENCOUNTER — Other Ambulatory Visit: Payer: Self-pay

## 2014-03-24 ENCOUNTER — Inpatient Hospital Stay (HOSPITAL_COMMUNITY)
Admission: EM | Admit: 2014-03-24 | Discharge: 2014-03-26 | DRG: 202 | Disposition: A | Discharge status: Home / Self Care | Payer: Medicaid Other | Attending: Internal Medicine | Admitting: Internal Medicine

## 2014-03-24 ENCOUNTER — Inpatient Hospital Stay (HOSPITAL_COMMUNITY): Payer: Medicaid Other

## 2014-03-24 ENCOUNTER — Other Ambulatory Visit (HOSPITAL_COMMUNITY): Payer: Self-pay

## 2014-03-24 ENCOUNTER — Emergency Department (HOSPITAL_COMMUNITY): Payer: Medicaid Other

## 2014-03-24 ENCOUNTER — Encounter (HOSPITAL_COMMUNITY): Payer: Self-pay

## 2014-03-24 DIAGNOSIS — J9601 Acute respiratory failure with hypoxia: Secondary | ICD-10-CM | POA: Diagnosis present

## 2014-03-24 DIAGNOSIS — D649 Anemia, unspecified: Secondary | ICD-10-CM | POA: Diagnosis present

## 2014-03-24 DIAGNOSIS — J45901 Unspecified asthma with (acute) exacerbation: Principal | ICD-10-CM | POA: Diagnosis present

## 2014-03-24 DIAGNOSIS — Z87891 Personal history of nicotine dependence: Secondary | ICD-10-CM

## 2014-03-24 DIAGNOSIS — R0600 Dyspnea, unspecified: Secondary | ICD-10-CM | POA: Diagnosis present

## 2014-03-24 DIAGNOSIS — Z79899 Other long term (current) drug therapy: Secondary | ICD-10-CM

## 2014-03-24 DIAGNOSIS — Z6841 Body Mass Index (BMI) 40.0 and over, adult: Secondary | ICD-10-CM | POA: Diagnosis not present

## 2014-03-24 DIAGNOSIS — Z7951 Long term (current) use of inhaled steroids: Secondary | ICD-10-CM | POA: Diagnosis not present

## 2014-03-24 DIAGNOSIS — J45909 Unspecified asthma, uncomplicated: Secondary | ICD-10-CM | POA: Diagnosis present

## 2014-03-24 DIAGNOSIS — G8929 Other chronic pain: Secondary | ICD-10-CM | POA: Diagnosis present

## 2014-03-24 DIAGNOSIS — J111 Influenza due to unidentified influenza virus with other respiratory manifestations: Secondary | ICD-10-CM | POA: Diagnosis present

## 2014-03-24 DIAGNOSIS — G4733 Obstructive sleep apnea (adult) (pediatric): Secondary | ICD-10-CM | POA: Diagnosis present

## 2014-03-24 DIAGNOSIS — R0603 Acute respiratory distress: Secondary | ICD-10-CM | POA: Diagnosis present

## 2014-03-24 DIAGNOSIS — N179 Acute kidney failure, unspecified: Secondary | ICD-10-CM | POA: Diagnosis present

## 2014-03-24 DIAGNOSIS — J80 Acute respiratory distress syndrome: Secondary | ICD-10-CM | POA: Diagnosis not present

## 2014-03-24 DIAGNOSIS — I10 Essential (primary) hypertension: Secondary | ICD-10-CM | POA: Diagnosis present

## 2014-03-24 DIAGNOSIS — E876 Hypokalemia: Secondary | ICD-10-CM | POA: Diagnosis present

## 2014-03-24 DIAGNOSIS — R739 Hyperglycemia, unspecified: Secondary | ICD-10-CM | POA: Diagnosis not present

## 2014-03-24 DIAGNOSIS — E1165 Type 2 diabetes mellitus with hyperglycemia: Secondary | ICD-10-CM | POA: Diagnosis present

## 2014-03-24 DIAGNOSIS — J9600 Acute respiratory failure, unspecified whether with hypoxia or hypercapnia: Secondary | ICD-10-CM | POA: Diagnosis present

## 2014-03-24 DIAGNOSIS — E119 Type 2 diabetes mellitus without complications: Secondary | ICD-10-CM

## 2014-03-24 DIAGNOSIS — R0602 Shortness of breath: Secondary | ICD-10-CM | POA: Diagnosis present

## 2014-03-24 HISTORY — DX: Obstructive sleep apnea (adult) (pediatric): G47.33

## 2014-03-24 LAB — CBC
HCT: 34.9 % — ABNORMAL LOW (ref 36.0–46.0)
Hemoglobin: 11.5 g/dL — ABNORMAL LOW (ref 12.0–15.0)
MCH: 28.3 pg (ref 26.0–34.0)
MCHC: 33 g/dL (ref 30.0–36.0)
MCV: 86 fL (ref 78.0–100.0)
PLATELETS: 246 10*3/uL (ref 150–400)
RBC: 4.06 MIL/uL (ref 3.87–5.11)
RDW: 15.7 % — ABNORMAL HIGH (ref 11.5–15.5)
WBC: 5.2 10*3/uL (ref 4.0–10.5)

## 2014-03-24 LAB — LACTIC ACID, PLASMA: LACTIC ACID, VENOUS: 3.8 mmol/L — AB (ref 0.5–2.0)

## 2014-03-24 LAB — I-STAT ARTERIAL BLOOD GAS, ED
Acid-base deficit: 2 mmol/L (ref 0.0–2.0)
Bicarbonate: 22.4 mEq/L (ref 20.0–24.0)
O2 Saturation: 99 %
PCO2 ART: 37.6 mmHg (ref 35.0–45.0)
TCO2: 23 mmol/L (ref 0–100)
pH, Arterial: 7.384 (ref 7.350–7.450)
pO2, Arterial: 150 mmHg — ABNORMAL HIGH (ref 80.0–100.0)

## 2014-03-24 LAB — BASIC METABOLIC PANEL
Anion gap: 7 (ref 5–15)
BUN: 14 mg/dL (ref 6–23)
CO2: 26 mmol/L (ref 19–32)
Calcium: 8.5 mg/dL (ref 8.4–10.5)
Chloride: 104 mmol/L (ref 96–112)
Creatinine, Ser: 1.13 mg/dL — ABNORMAL HIGH (ref 0.50–1.10)
GFR calc non Af Amer: 58 mL/min — ABNORMAL LOW (ref >90)
GFR, EST AFRICAN AMERICAN: 67 mL/min — AB (ref >90)
GLUCOSE: 167 mg/dL — AB (ref 70–99)
POTASSIUM: 3.3 mmol/L — AB (ref 3.5–5.1)
SODIUM: 137 mmol/L (ref 135–145)

## 2014-03-24 LAB — I-STAT TROPONIN, ED: Troponin i, poc: 0.01 ng/mL (ref 0.00–0.08)

## 2014-03-24 LAB — D-DIMER, QUANTITATIVE: D-Dimer, Quant: 1.31 ug/mL-FEU — ABNORMAL HIGH (ref 0.00–0.48)

## 2014-03-24 LAB — TROPONIN I
Troponin I: <0.03 ng/mL (ref <0.031)
Troponin I: <0.03 ng/mL (ref <0.031)
Troponin I: <0.03 ng/mL (ref <0.031)

## 2014-03-24 LAB — BRAIN NATRIURETIC PEPTIDE
B NATRIURETIC PEPTIDE 5: 10.6 pg/mL (ref 0.0–100.0)
B Natriuretic Peptide: 17 pg/mL (ref 0.0–100.0)

## 2014-03-24 LAB — POC URINE PREG, ED: PREG TEST UR: NEGATIVE

## 2014-03-24 MED ORDER — DEXTROSE 5 % IV SOLN
1.0000 g | INTRAVENOUS | Status: DC
  Administered 2014-03-25 – 2014-03-26 (×2): 1 g via INTRAVENOUS
  Filled 2014-03-24 (×2): qty 10

## 2014-03-24 MED ORDER — LEVOFLOXACIN IN D5W 500 MG/100ML IV SOLN
500.0000 mg | Freq: Every day | INTRAVENOUS | Status: DC
  Administered 2014-03-24: 500 mg via INTRAVENOUS
  Filled 2014-03-24: qty 100

## 2014-03-24 MED ORDER — DEXTROSE 5 % IV SOLN: 1.0000 g | Freq: Once | INTRAVENOUS | Status: DC

## 2014-03-24 MED ORDER — GUAIFENESIN-DM 100-10 MG/5ML PO SYRP
5.0000 mL | ORAL_SOLUTION | ORAL | Status: DC | PRN
  Administered 2014-03-24 – 2014-03-26 (×3): 5 mL via ORAL
  Filled 2014-03-24 (×3): qty 5

## 2014-03-24 MED ORDER — ALBUTEROL SULFATE (2.5 MG/3ML) 0.083% IN NEBU: 2.5000 mg | INHALATION_SOLUTION | RESPIRATORY_TRACT | Status: DC

## 2014-03-24 MED ORDER — SODIUM CHLORIDE 0.9 % IV SOLN: INTRAVENOUS | Status: DC

## 2014-03-24 MED ORDER — ENOXAPARIN SODIUM 80 MG/0.8ML ~~LOC~~ SOLN
80.0000 mg | SUBCUTANEOUS | Status: DC
  Administered 2014-03-24 – 2014-03-26 (×3): 80 mg via SUBCUTANEOUS
  Filled 2014-03-24 (×5): qty 0.8

## 2014-03-24 MED ORDER — IOHEXOL 350 MG/ML SOLN
100.0000 mL | Freq: Once | INTRAVENOUS | Status: AC | PRN
Start: 2014-03-24 — End: 2014-03-24
  Administered 2014-03-24: 100 mL via INTRAVENOUS

## 2014-03-24 MED ORDER — POTASSIUM CHLORIDE CRYS ER 20 MEQ PO TBCR
20.0000 meq | EXTENDED_RELEASE_TABLET | Freq: Once | ORAL | Status: AC
  Administered 2014-03-24: 20 meq via ORAL
  Filled 2014-03-24: qty 1

## 2014-03-24 MED ORDER — ACETAMINOPHEN-CODEINE #3 300-30 MG PO TABS: 1.0000 | ORAL_TABLET | Freq: Four times a day (QID) | ORAL | Status: DC | PRN

## 2014-03-24 MED ORDER — ALBUTEROL SULFATE (2.5 MG/3ML) 0.083% IN NEBU: 2.5000 mg | INHALATION_SOLUTION | Freq: Four times a day (QID) | RESPIRATORY_TRACT | Status: DC

## 2014-03-24 MED ORDER — ALBUTEROL (5 MG/ML) CONTINUOUS INHALATION SOLN
10.0000 mg/h | INHALATION_SOLUTION | RESPIRATORY_TRACT | Status: DC
  Administered 2014-03-24 (×2): 10 mg/h via RESPIRATORY_TRACT

## 2014-03-24 MED ORDER — IPRATROPIUM BROMIDE 0.02 % IN SOLN
RESPIRATORY_TRACT | Status: AC
  Administered 2014-03-24: 05:00:00
  Filled 2014-03-24: qty 2.5

## 2014-03-24 MED ORDER — ALBUTEROL SULFATE (2.5 MG/3ML) 0.083% IN NEBU: 2.5000 mg | INHALATION_SOLUTION | Freq: Four times a day (QID) | RESPIRATORY_TRACT | Status: DC | PRN

## 2014-03-24 MED ORDER — ACETAMINOPHEN 650 MG RE SUPP: 650.0000 mg | Freq: Four times a day (QID) | RECTAL | Status: DC | PRN

## 2014-03-24 MED ORDER — MONTELUKAST SODIUM 10 MG PO TABS: 10.0000 mg | ORAL_TABLET | Freq: Every day | ORAL | Status: DC

## 2014-03-24 MED ORDER — DEXTROSE 5 % IV SOLN
500.0000 mg | Freq: Once | INTRAVENOUS | Status: AC
  Administered 2014-03-24: 500 mg via INTRAVENOUS
  Filled 2014-03-24: qty 500

## 2014-03-24 MED ORDER — BENZONATATE 100 MG PO CAPS
100.0000 mg | ORAL_CAPSULE | Freq: Three times a day (TID) | ORAL | Status: DC | PRN
Start: 2014-03-24 — End: 2014-03-24

## 2014-03-24 MED ORDER — ALBUTEROL SULFATE (2.5 MG/3ML) 0.083% IN NEBU: 2.5000 mg | INHALATION_SOLUTION | RESPIRATORY_TRACT | Status: DC | PRN

## 2014-03-24 MED ORDER — ALBUTEROL SULFATE HFA 108 (90 BASE) MCG/ACT IN AERS: 2.0000 | INHALATION_SPRAY | Freq: Four times a day (QID) | RESPIRATORY_TRACT | Status: DC | PRN

## 2014-03-24 MED ORDER — IPRATROPIUM BROMIDE 0.02 % IN SOLN: 0.5000 mg | RESPIRATORY_TRACT | Status: DC

## 2014-03-24 MED ORDER — LOSARTAN POTASSIUM 25 MG PO TABS: 25.0000 mg | ORAL_TABLET | Freq: Every day | ORAL | Status: DC

## 2014-03-24 MED ORDER — POTASSIUM CHLORIDE CRYS ER 20 MEQ PO TBCR
40.0000 meq | EXTENDED_RELEASE_TABLET | Freq: Once | ORAL | Status: AC
  Administered 2014-03-24: 40 meq via ORAL
  Filled 2014-03-24: qty 2

## 2014-03-24 MED ORDER — DEXTROSE 5 % IV SOLN
500.0000 mg | INTRAVENOUS | Status: DC
  Administered 2014-03-25 – 2014-03-26 (×2): 500 mg via INTRAVENOUS
  Filled 2014-03-24 (×2): qty 500

## 2014-03-24 MED ORDER — SODIUM CHLORIDE 0.9 % IJ SOLN
3.0000 mL | Freq: Two times a day (BID) | INTRAMUSCULAR | Status: DC
  Administered 2014-03-24 – 2014-03-25 (×3): 3 mL via INTRAVENOUS

## 2014-03-24 MED ORDER — ACETAMINOPHEN 325 MG PO TABS
650.0000 mg | ORAL_TABLET | Freq: Four times a day (QID) | ORAL | Status: DC | PRN
  Administered 2014-03-25 – 2014-03-26 (×2): 650 mg via ORAL
  Filled 2014-03-24 (×3): qty 2

## 2014-03-24 MED ORDER — AZITHROMYCIN 500 MG IV SOLR: 500.0000 mg | Freq: Once | INTRAVENOUS | Status: DC

## 2014-03-24 MED ORDER — METHYLPREDNISOLONE SODIUM SUCC 125 MG IJ SOLR
80.0000 mg | Freq: Three times a day (TID) | INTRAMUSCULAR | Status: DC
  Administered 2014-03-24 – 2014-03-25 (×3): 80 mg via INTRAVENOUS
  Filled 2014-03-24 (×2): qty 1.28
  Filled 2014-03-24: qty 2
  Filled 2014-03-24: qty 1.28
  Filled 2014-03-24 (×2): qty 2

## 2014-03-24 MED ORDER — MOMETASONE FURO-FORMOTEROL FUM 100-5 MCG/ACT IN AERO: 2.0000 | INHALATION_SPRAY | Freq: Two times a day (BID) | RESPIRATORY_TRACT | Status: DC

## 2014-03-24 MED ORDER — FENTANYL CITRATE 0.05 MG/ML IJ SOLN
50.0000 ug | Freq: Once | INTRAMUSCULAR | Status: AC
  Administered 2014-03-24: 50 ug via INTRAVENOUS
  Filled 2014-03-24: qty 2

## 2014-03-24 MED ORDER — ALBUTEROL (5 MG/ML) CONTINUOUS INHALATION SOLN
10.0000 mg/h | INHALATION_SOLUTION | RESPIRATORY_TRACT | Status: DC
  Administered 2014-03-24: 10 mg/h via RESPIRATORY_TRACT

## 2014-03-24 MED ORDER — LORAZEPAM 2 MG/ML IJ SOLN
0.5000 mg | Freq: Four times a day (QID) | INTRAMUSCULAR | Status: DC | PRN
  Administered 2014-03-25: 0.5 mg via INTRAVENOUS
  Filled 2014-03-24 (×2): qty 1

## 2014-03-24 MED ORDER — FUROSEMIDE 10 MG/ML IJ SOLN
60.0000 mg | Freq: Once | INTRAMUSCULAR | Status: AC
  Administered 2014-03-24: 60 mg via INTRAVENOUS
  Filled 2014-03-24: qty 6

## 2014-03-24 MED ORDER — DEXTROSE 5 % IV SOLN
1.0000 g | Freq: Once | INTRAVENOUS | Status: AC
  Administered 2014-03-24: 1 g via INTRAVENOUS
  Filled 2014-03-24: qty 10

## 2014-03-24 MED ORDER — OSELTAMIVIR PHOSPHATE 75 MG PO CAPS
75.0000 mg | ORAL_CAPSULE | Freq: Two times a day (BID) | ORAL | Status: DC
  Administered 2014-03-24 – 2014-03-26 (×4): 75 mg via ORAL
  Filled 2014-03-24 (×7): qty 1

## 2014-03-24 MED ORDER — GUAIFENESIN ER 600 MG PO TB12
1200.0000 mg | ORAL_TABLET | Freq: Two times a day (BID) | ORAL | Status: DC
  Administered 2014-03-24 – 2014-03-26 (×4): 1200 mg via ORAL
  Filled 2014-03-24 (×7): qty 2

## 2014-03-24 MED ORDER — BUDESONIDE 0.25 MG/2ML IN SUSP
0.2500 mg | Freq: Two times a day (BID) | RESPIRATORY_TRACT | Status: DC
  Administered 2014-03-24 – 2014-03-25 (×2): 0.25 mg via RESPIRATORY_TRACT
  Filled 2014-03-24 (×7): qty 2

## 2014-03-24 MED ORDER — ALBUTEROL (5 MG/ML) CONTINUOUS INHALATION SOLN
INHALATION_SOLUTION | RESPIRATORY_TRACT | Status: AC
  Filled 2014-03-24: qty 20

## 2014-03-24 MED ORDER — IPRATROPIUM-ALBUTEROL 0.5-2.5 (3) MG/3ML IN SOLN
3.0000 mL | RESPIRATORY_TRACT | Status: DC
  Administered 2014-03-24 – 2014-03-25 (×6): 3 mL via RESPIRATORY_TRACT
  Filled 2014-03-24 (×6): qty 3

## 2014-03-24 NOTE — ED Provider Notes (Signed)
CSN: [id number]     Arrival date & time 03/24/14  0151 History  This chart was scribed for Gerhard Munch, MD by Karle Plumber, ED Scribe. This patient was seen in room B15C/B15C and the patient's care was started at 3:20 AM.  Chief Complaint  Patient presents with  . Respiratory Distress  . Chest Pain   Patient is a 44 y.o. female presenting with chest pain. The history is provided by the patient. No language interpreter was used.  Chest Pain Associated symptoms: not vomiting     HPI Comments:  Melanie Hess is a 44 y.o. morbidly obese female brought in by EMS, who presents to the Emergency Department complaining of cough and SOB that started three days ago. She reports chills, light headedness and left lower back pain. She states she was scheduled to have an echocardiogram secondary to sharp chest pain but has had to reschedule it. Denies modifying factors. Denies fever,, confusion or weakness. PMHx of migraines, asthma, HTN, right foot plantar fasciitis and right ankle fracture. Past surgical h/o tubal ligation.  Past Medical History  Diagnosis Date  . Migraine   . Asthma   . Ankle fracture, right 2001  . Plantar fasciitis of right foot   . Hypertension    Past Surgical History  Procedure Laterality Date  . Tubal ligation     Family History  Problem Relation Age of Onset  . Breast cancer Maternal Aunt   . Brain cancer Cousin    History  Substance Use Topics  . Smoking status: Former Smoker -- 0.01 packs/day for 1 years    Types: Cigarettes    Quit date: 01/18/1994  . Smokeless tobacco: Never Used  . Alcohol Use: No   OB History    No data available     Review of Systems  Constitutional:       Per HPI, otherwise negative  HENT:       Per HPI, otherwise negative  Respiratory:       Per HPI, otherwise negative  Cardiovascular: Positive for chest pain.       Per HPI, otherwise negative  Gastrointestinal: Negative for vomiting.  Endocrine:       Negative  aside from HPI  Genitourinary:       Neg aside from HPI   Musculoskeletal:       Per HPI, otherwise negative  Skin: Negative.   Neurological: Negative for syncope.    Allergies  Aspirin; Penicillins; Percocet; Tramadol; and Vicodin  Home Medications   Prior to Admission medications   Medication Sig Start Date End Date Taking? Authorizing Provider  acetaminophen-codeine (TYLENOL #3) 300-30 MG per tablet Take 1-2 tablets by mouth every 6 (six) hours as needed for moderate pain. 06/16/13  Yes Hayden Rasmussen, NP  albuterol (PROVENTIL HFA;VENTOLIN HFA) 108 (90 BASE) MCG/ACT inhaler Inhale 2 puffs into the lungs every 6 (six) hours as needed. Shortness of breath   Yes Historical Provider, MD  albuterol (PROVENTIL) (2.5 MG/3ML) 0.083% nebulizer solution Take 2.5 mg by nebulization every 6 (six) hours as needed for wheezing or shortness of breath.   Yes Historical Provider, MD  losartan (COZAAR) 25 MG tablet Take 1 tablet (25 mg total) by mouth daily. 02/14/14  Yes Kalman Shan, MD  montelukast (SINGULAIR) 10 MG tablet Take 10 mg by mouth at bedtime.    Historical Provider, MD  predniSONE (DELTASONE) 20 MG tablet Take 2 tablets (40 mg total) by mouth daily. Patient not taking: Reported on 03/24/2014 01/27/14  Hannah Muthersbaugh, PA-C   Triage Vitals: BP 140/72 mmHg  Pulse 88  Temp(Src) 99.3 F (37.4 C) (Oral)  Resp 20  SpO2 96% Physical Exam  Constitutional: She is oriented to person, place, and time. She appears well-developed and well-nourished. She appears ill. Face mask in place.  Obese female sitting upright in bed receiving supplemental oxygen.  HENT:  Head: Normocephalic and atraumatic.  Eyes: Conjunctivae and EOM are normal.  Cardiovascular: Normal rate, regular rhythm and normal heart sounds.  Exam reveals no gallop and no friction rub.   No murmur heard. Pulmonary/Chest: Accessory muscle usage present. No stridor. Tachypnea noted. She is in respiratory distress. She has decreased  breath sounds.  Diminished breath sounds throughout.  Abdominal: She exhibits no distension.  Musculoskeletal: She exhibits no edema.  Neurological: She is alert and oriented to person, place, and time. No cranial nerve deficit.  Skin: Skin is warm and dry.  Psychiatric: She has a normal mood and affect.  Nursing note and vitals reviewed.   ED Course  Procedures (including critical care time) DIAGNOSTIC STUDIES: Oxygen Saturation is 96% on 8 L/min face mask, abnormal by my interpretation.   COORDINATION OF CARE: 3:24 AM- Will order labs and pain medication. Pt verbalizes understanding and agrees to plan.  Medications  ipratropium (ATROVENT) 0.02 % nebulizer solution (not administered)  albuterol (PROVENTIL, VENTOLIN) (5 MG/ML) 0.5% continuous inhalation solution (not administered)  albuterol (PROVENTIL,VENTOLIN) solution continuous neb (not administered)  ipratropium (ATROVENT) nebulizer solution 0.5 mg (not administered)    Labs Review Labs Reviewed  CBC - Abnormal; Notable for the following:    Hemoglobin 11.5 (*)    HCT 34.9 (*)    RDW 15.7 (*)    All other components within normal limits  BASIC METABOLIC PANEL - Abnormal; Notable for the following:    Potassium 3.3 (*)    Glucose, Bld 167 (*)    Creatinine, Ser 1.13 (*)    GFR calc non Af Amer 58 (*)    GFR calc Af Amer 67 (*)    All other components within normal limits  BRAIN NATRIURETIC PEPTIDE  I-STAT TROPOININ, ED  POC URINE PREG, ED    Imaging Review Dg Chest Port 1 View  03/24/2014   CLINICAL DATA:  Acute onset of respiratory distress. Chest pain. Initial encounter.  EXAM: PORTABLE CHEST - 1 VIEW  COMPARISON:  Chest radiograph performed 01/27/2014  FINDINGS: The lungs are well-aerated. Right basilar airspace opacity is concerning for pneumonia. There is no evidence of pleural effusion or pneumothorax.  The cardiomediastinal silhouette is within normal limits. No acute osseous abnormalities are seen.   IMPRESSION: Right basilar airspace opacity is concerning for pneumonia.   Electronically Signed   By: Roanna Raider M.D.   On: 03/24/2014 02:47   I reviewed the x-ray, agree with the interpretation.   EKG Interpretation   Date/Time:  Sunday March 24 2014 02:02:22 EST Ventricular Rate:  85 PR Interval:  131 QRS Duration: 85 QT Interval:  381 QTC Calculation: 453 R Axis:   49 Text Interpretation:  Sinus rhythm Sinus rhythm Artifact Borderline ECG  Confirmed by Gerhard Munch  MD ([id number]) on 03/24/2014 3:25:45 AM      After one hour of continuous nebulizers, the patient has diminished breath sounds.  With XR e/o PNA, the patient received initial ABX.  5:06 AM Patient is hypoxic on RA, 85%.  Second continuous neb starting. (Patient on BiPaP)   MDM  Patient with asthma presents with ongoing respiratory distress,  is found to have pneumonia.  Patient had multiple breathing treatments, including 2 sessions of continuous nebulizer therapy. Patient required BiPAP after she remained wheezing, after initial interventions. Patient was afebrile, w/o leukocytosis, but w concern for persistent hypoxia, she required step-down admission for further E/M.  EKG did not have ischemic changes, and the patient had no chest pain, with no troponin elevation, and there is low suspicion for ongoing coronary ischemia.  CRITICAL CARE Performed by: Gerhard MunchLOCKWOOD, Jaleeya Mcnelly Total critical care time: 40 Critical care time was exclusive of separately billable procedures and treating other patients. Critical care was necessary to treat or prevent imminent or life-threatening deterioration. Critical care was time spent personally by me on the following activities: development of treatment plan with patient and/or surrogate as well as nursing, discussions with consultants, evaluation of patient's response to treatment, examination of patient, obtaining history from patient or surrogate, ordering and performing treatments  and interventions, ordering and review of laboratory studies, ordering and review of radiographic studies, pulse oximetry and re-evaluation of patient's condition.   I personally performed the services described in this documentation, which was scribed in my presence. The recorded information has been reviewed and is accurate.    Gerhard Munchobert Marybeth Dandy, MD 03/24/14 [phone]

## 2014-03-24 NOTE — ED Notes (Signed)
Pt has had productive cough, does not know color. Pt has been using her inhaler with difficulty breathing. Pt also c/o left rib pain and left chest pain that increases in pain with palpation and movement. Pt did feel some relief with treatment.

## 2014-03-24 NOTE — Progress Notes (Signed)
[facility] PROGRESS NOTE   Melanie Hess ZOX:[id number] DOB: 1970-09-08 DOA: 03/24/2014 PCP: Pcp Not In System  HPI/Subjective: Patient seen with mother at bedside, on BiPAP, still breathing about 35-40 times a minute. Had some chest pain, left-sided, does not correlate to the right sided pneumonia  Assessment/Plan: Active Problems:   Asthma   Respiratory distress   Anemia   Hyperglycemia   Asthma exacerbation    Acute respiratory failure with hypoxia Patient presented with shortness of breath, respiratory distress with respiratory rate in the 30s. Placed on BiPAP, Lasix given, IV fluids discontinued. Get 2-D echocardiogram, CT angiography pending. PCCM consulted.   Code Status: Full Code Family Communication: Plan discussed with the patient. Disposition Plan: Remains inpatient Diet: Diet NPO time specified  Consultants:  None  Procedures:  None  Antibiotics:  Rocephin and azithromycin   Objective: Filed Vitals:   03/24/14 0830  BP: 137/68  Pulse: 93  Temp:   Resp: 28    Intake/Output Summary (Last 24 hours) at 03/24/14 1049 Last data filed at 03/24/14 0950  Gross per 24 hour  Intake      0 ml  Output    575 ml  Net   -575 ml   There were no vitals filed for this visit.  Exam: General: Alert and awake, oriented x3, not in any acute distress. HEENT: anicteric sclera, pupils reactive to light and accommodation, EOMI CVS: S1-S2 clear, no murmur rubs or gallops Chest: clear to auscultation bilaterally, no wheezing, rales or rhonchi Abdomen: soft nontender, nondistended, normal bowel sounds, no organomegaly Extremities: no cyanosis, clubbing or edema noted bilaterally Neuro: Cranial nerves II-XII intact, no focal neurological deficits  Data Reviewed: Basic Metabolic Panel:  Recent Labs Lab 03/24/14 0159  NA 137  K 3.3*  CL 104  CO2 26  GLUCOSE 167*  BUN 14  CREATININE 1.13*  CALCIUM 8.5   Liver Function Tests: No  results for input(s): AST, ALT, ALKPHOS, BILITOT, PROT, ALBUMIN in the last 168 hours. No results for input(s): LIPASE, AMYLASE in the last 168 hours. No results for input(s): AMMONIA in the last 168 hours. CBC:  Recent Labs Lab 03/24/14 0159  WBC 5.2  HGB 11.5*  HCT 34.9*  MCV 86.0  PLT 246   Cardiac Enzymes: No results for input(s): CKTOTAL, CKMB, CKMBINDEX, TROPONINI in the last 168 hours. BNP (last 3 results)  Recent Labs  01/27/14 2004 03/24/14 0159  BNP 15.5 10.6    ProBNP (last 3 results)  Recent Labs  12/31/13 0935  PROBNP 36.0    CBG: No results for input(s): GLUCAP in the last 168 hours.  Micro No results found for this or any previous visit (from the past 240 hour(s)).   Studies: Dg Chest Port 1 View  03/24/2014   CLINICAL DATA:  Acute onset of respiratory distress. Chest pain. Initial encounter.  EXAM: PORTABLE CHEST - 1 VIEW  COMPARISON:  Chest radiograph performed 01/27/2014  FINDINGS: The lungs are well-aerated. Right basilar airspace opacity is concerning for pneumonia. There is no evidence of pleural effusion or pneumothorax.  The cardiomediastinal silhouette is within normal limits. No acute osseous abnormalities are seen.  IMPRESSION: Right basilar airspace opacity is concerning for pneumonia.   Electronically Signed   By: Roanna Raider M.D.   On: 03/24/2014 02:47    Scheduled Meds: . albuterol      . enoxaparin (LOVENOX) injection  40 mg Subcutaneous Q24H  . guaiFENesin  1,200 mg Oral BID  . ipratropium-albuterol  3  mL Nebulization Q4H  . methylPREDNISolone (SOLU-MEDROL) injection  80 mg Intravenous 3 times per day  . mometasone-formoterol  2 puff Inhalation BID  . montelukast  10 mg Oral QHS  . potassium chloride  20 mEq Oral Once  . potassium chloride  40 mEq Oral Once  . sodium chloride  3 mL Intravenous Q12H   Continuous Infusions: . azithromycin    . cefTRIAXone (ROCEPHIN)  IV         Time spent: 35 minutes    [facility] Medical CenterELMAHI,Paulla Mcclaskey  A  [facility] Pager [phone] If 7PM-7AM, please contact night-coverage at [email or web address], password [email or web address] 03/24/2014, 10:49 AM  LOS: 0 days

## 2014-03-24 NOTE — ED Notes (Signed)
Per [facility]: C/o of difficulty breathing X 3 days, hot and cold flashes, feels like when she had pneumonia last month.  Pt lung sounds diminished to both lower lobes.  Pt on 5 albuterol, .5 atrovent upon arrival to ED.  Pt also received 125 mg soludmerol.

## 2014-03-24 NOTE — ED Notes (Signed)
RT notified of pt status

## 2014-03-24 NOTE — Progress Notes (Signed)
03/24/14 1730 nsg Patient admitted to unit per bed accompanied by NT and family alert and oriented ; with O2 per [place]; telemetry placed by CN. Oriented to unit set up. Continued to monitor.

## 2014-03-24 NOTE — ED Notes (Signed)
RT at bedside.

## 2014-03-24 NOTE — ED Notes (Addendum)
Pt taken off bipap. sats 96% on 2l. RR 20

## 2014-03-24 NOTE — ED Notes (Signed)
Please call pts mother Melanie Hess, when pt is roomed

## 2014-03-24 NOTE — H&P (Addendum)
Melanie Hess is an 44 y.o. female.    Dr. Jimmye Norman (pcp)  Chief Complaint: dyspnea, cough HPI: 44 yo female with OSA (not on cpap), Asthma , apparently c/o cough beginning about 4 days ago.  Pt has sputum but can;t tell what color the phlegm is.  Pt notes slight chest pain " sharp" worse with coughing and palpation.  Pt presented to ED for evaluation and CXR showed evidence of infiltrate.  Pt will be admitted for pneumonia, asthma exacerbation.   Past Medical History  Diagnosis Date  . Migraine   . Asthma   . Ankle fracture, right 2001  . Plantar fasciitis of right foot   . Hypertension     Past Surgical History  Procedure Laterality Date  . Tubal ligation      Family History  Problem Relation Age of Onset  . Breast cancer Maternal Aunt   . Brain cancer Cousin   . Hypertension Mother    Social History:  reports that she quit smoking about 20 years ago. Her smoking use included Cigarettes. She has a .01 pack-year smoking history. She has never used smokeless tobacco. She reports that she does not drink alcohol or use illicit drugs.  Allergies:  Allergies  Allergen Reactions  . Aspirin Swelling  . Penicillins Swelling  . Percocet [Oxycodone-Acetaminophen] Hives and Itching  . Tramadol Nausea Only  . Vicodin [Hydrocodone-Acetaminophen] Hives and Itching  Medications reviewed   (Not in a hospital admission)  Results for orders placed or performed during the hospital encounter of 03/24/14 (from the past 48 hour(s))  CBC     Status: Abnormal   Collection Time: 03/24/14  1:59 AM  Result Value Ref Range   WBC 5.2 4.0 - 10.5 K/uL   RBC 4.06 3.87 - 5.11 MIL/uL   Hemoglobin 11.5 (L) 12.0 - 15.0 g/dL   HCT 34.9 (L) 36.0 - 46.0 %   MCV 86.0 78.0 - 100.0 fL   MCH 28.3 26.0 - 34.0 pg   MCHC 33.0 30.0 - 36.0 g/dL   RDW 15.7 (H) 11.5 - 15.5 %   Platelets 246 150 - 400 K/uL  Basic metabolic panel     Status: Abnormal   Collection Time: 03/24/14  1:59 AM  Result Value  Ref Range   Sodium 137 135 - 145 mmol/L   Potassium 3.3 (L) 3.5 - 5.1 mmol/L   Chloride 104 96 - 112 mmol/L   CO2 26 19 - 32 mmol/L   Glucose, Bld 167 (H) 70 - 99 mg/dL   BUN 14 6 - 23 mg/dL   Creatinine, Ser 1.13 (H) 0.50 - 1.10 mg/dL   Calcium 8.5 8.4 - 10.5 mg/dL   GFR calc non Af Amer 58 (L) >90 mL/min   GFR calc Af Amer 67 (L) >90 mL/min    Comment: (NOTE) The eGFR has been calculated using the CKD EPI equation. This calculation has not been validated in all clinical situations. eGFR's persistently <90 mL/min signify possible Chronic Kidney Disease.    Anion gap 7 5 - 15  BNP (order ONLY if patient complains of dyspnea/SOB AND you have documented it for THIS visit)     Status: None   Collection Time: 03/24/14  1:59 AM  Result Value Ref Range   B Natriuretic Peptide 10.6 0.0 - 100.0 pg/mL  I-stat troponin, ED (not at [facility])     Status: None   Collection Time: 03/24/14  2:06 AM  Result Value Ref Range   Troponin i, poc  0.01 0.00 - 0.08 ng/mL   Comment 3            Comment: Due to the release kinetics of cTnI, a negative result within the first hours of the onset of symptoms does not rule out myocardial infarction with certainty. If myocardial infarction is still suspected, repeat the test at appropriate intervals.    Dg Chest Port 1 View  03/24/2014   CLINICAL DATA:  Acute onset of respiratory distress. Chest pain. Initial encounter.  EXAM: PORTABLE CHEST - 1 VIEW  COMPARISON:  Chest radiograph performed 01/27/2014  FINDINGS: The lungs are well-aerated. Right basilar airspace opacity is concerning for pneumonia. There is no evidence of pleural effusion or pneumothorax.  The cardiomediastinal silhouette is within normal limits. No acute osseous abnormalities are seen.  IMPRESSION: Right basilar airspace opacity is concerning for pneumonia.   Electronically Signed   By: Garald Balding M.D.   On: 03/24/2014 02:47    Review of Systems  Constitutional: Negative.   HENT: Negative.    Eyes: Negative.   Respiratory: Positive for cough, sputum production, shortness of breath and wheezing. Negative for hemoptysis.   Cardiovascular: Positive for chest pain. Negative for palpitations, orthopnea, claudication, leg swelling and PND.  Gastrointestinal: Negative for heartburn, nausea, vomiting, abdominal pain, diarrhea, constipation, blood in stool and melena.  Genitourinary: Negative for dysuria, urgency, frequency, hematuria and flank pain.  Musculoskeletal: Negative.   Skin: Negative.   Neurological: Negative.   Endo/Heme/Allergies: Negative.   Psychiatric/Behavioral: Negative.     Blood pressure 142/81, pulse 102, temperature 99.1 F (37.3 C), temperature source Oral, resp. rate 28, SpO2 98 %. Physical Exam  Constitutional: She is oriented to person, place, and time. She appears well-developed and well-nourished.  HENT:  Head: Normocephalic and atraumatic.  Eyes: Conjunctivae and EOM are normal. Pupils are equal, round, and reactive to light. No scleral icterus.  Neck: Normal range of motion. Neck supple. No JVD present. No tracheal deviation present. No thyromegaly present.  Cardiovascular: Normal rate and regular rhythm.  Exam reveals no gallop and no friction rub.   No murmur heard. Respiratory: She is in respiratory distress. She has wheezes. She has rales. She exhibits tenderness.  GI: Soft. Bowel sounds are normal. She exhibits no distension. There is no tenderness. There is no rebound and no guarding.  Musculoskeletal: Normal range of motion. She exhibits no edema or tenderness.  Lymphadenopathy:    She has no cervical adenopathy.  Neurological: She is alert and oriented to person, place, and time. She has normal reflexes. She displays normal reflexes. No cranial nerve deficit. She exhibits normal muscle tone. Coordination normal.  Skin: Skin is warm and dry. No rash noted. No erythema. No pallor.  Psychiatric: She has a normal mood and affect. Her behavior is  normal. Judgment and thought content normal.     Assessment/Plan Pneumonia, cap levaquin 515m iv qday Blood culture x2 Sputum gram stain and culture if pt able.   Asthma exacerbation Solumedrol 845miv q8h Albuterol neb q6h and q6h prn Will start on dulera due to the fact that she has had multiple exacerbations this past year  Anemia Check cbc tomorrow am  Hypokalemia Replete kdur 2020mpo x1,  Check cmp tomorrow am  Hyperglycemia Check hga1c  Chest pain likely costocondritis,or musculoskeletal from cough Check d dimer Check trop i q6h x3 Check cardiac echo If d dimer is positive and gfr ok then CTA chest  DVT prophylaxis: scd, lovenox  Sharlyne Koeneman 03/24/2014, 6:13 AM

## 2014-03-24 NOTE — ED Notes (Signed)
Pt placed on bipap  

## 2014-03-24 NOTE — Consult Note (Signed)
PULMONARY / CRITICAL CARE MEDICINE   Name: Melanie Hess MRN: [id number] DOB: [birth year]    ADMISSION DATE:  03/24/2014 CONSULTATION DATE:  3/6  REFERRING MD :  TRH  CHIEF COMPLAINT:  Resp Distress/Asthma exacerbation   INITIAL PRESENTATION: 44 yo morbidly obese AAF presented with 4 days of dyspnea admitted by [facility] for possible PNA and asthma exacerbation decompensated in ER with increased WOB requiring BIPAP . PCCM consulted  MR office pt recent pulmonary consult    STUDIES:  2/9 MCT >unequivocal for Asthma -difficult to exclude  3/6 CTa CHest >>neg PE , atx , no PNA   SIGNIFICANT EVENTS: 3/6 BIPAP in ER    HISTORY OF PRESENT ILLNESS:   44 yo female with OSA (not on cpap), suspected Asthma , apparently c/o cough beginning about 4 days PTA . Pt has sputum but can;t tell what color the phlegm is. Pt notes slight chest pain " sharp" worse with coughing and palpation. Pt presented to ED for evaluation and CXR showed evidence of infiltrate.   Pt admitted by [facility] for CAP /asthma . Given Nebs, steroids and abx.  Increased resp distress and wob , placed on BIPAP . PCCM consulted.  CTa chest neg for PE ,  No PNA.  Pt in room on BIPAP , anxious, mother at bedside.  Recent pulmonary consult with Dr. Marchelle Gearing last month due to ?poorly controlled asthma with frequent ER visit.  She was taken off ACE , singulair  And steroids . Set up for sleep study -results pending and echo.  Spirometry showed restriction in office c/w obesity .  No desats with ambulation.  Says she gets winded very easily.     PAST MEDICAL HISTORY :   has a past medical history of Migraine; Asthma; Ankle fracture, right (2001); Plantar fasciitis of right foot; Hypertension; and OSA (obstructive sleep apnea).  has past surgical history that includes Tubal ligation. Prior to Admission medications   Medication Sig Start Date End Date Taking? Authorizing Provider  acetaminophen-codeine (TYLENOL #3) 300-30 MG per  tablet Take 1-2 tablets by mouth every 6 (six) hours as needed for moderate pain. 06/16/13  Yes Hayden Rasmussen, NP  albuterol (PROVENTIL HFA;VENTOLIN HFA) 108 (90 BASE) MCG/ACT inhaler Inhale 2 puffs into the lungs every 6 (six) hours as needed. Shortness of breath   Yes Historical Provider, MD  albuterol (PROVENTIL) (2.5 MG/3ML) 0.083% nebulizer solution Take 2.5 mg by nebulization every 6 (six) hours as needed for wheezing or shortness of breath.   Yes Historical Provider, MD  losartan (COZAAR) 25 MG tablet Take 1 tablet (25 mg total) by mouth daily. 02/14/14  Yes Kalman Shan, MD  montelukast (SINGULAIR) 10 MG tablet Take 10 mg by mouth at bedtime.    Historical Provider, MD  predniSONE (DELTASONE) 20 MG tablet Take 2 tablets (40 mg total) by mouth daily. Patient not taking: Reported on 03/24/2014 01/27/14   Dahlia Client Muthersbaugh, PA-C   Allergies  Allergen Reactions  . Aspirin Swelling  . Penicillins Swelling  . Percocet [Oxycodone-Acetaminophen] Hives and Itching  . Tramadol Nausea Only  . Vicodin [Hydrocodone-Acetaminophen] Hives and Itching    FAMILY HISTORY:  indicated that her mother is alive. She indicated that her father is alive.  SOCIAL HISTORY:  reports that she quit smoking about 20 years ago. Her smoking use included Cigarettes. She has a .01 pack-year smoking history. She has never used smokeless tobacco. She reports that she does not drink alcohol or use illicit drugs.  REVIEW OF SYSTEMS:  Constitutional:   No  weight loss, night sweats,  Fevers, chills, + fatigue, or  lassitude.  HEENT:   No headaches,  Difficulty swallowing,  Tooth/dental problems, or  Sore throat,                No sneezing, itching, ear ache,  +nasal congestion, post nasal drip,   CV:  No chest pain,  Orthopnea, PND, swelling in lower extremities, anasarca, dizziness, palpitations, syncope.   GI  No heartburn, indigestion, abdominal pain, nausea, vomiting, diarrhea, change in bowel habits, loss of  appetite, bloody stools.   Resp:    No chest wall deformity  Skin: no rash or lesions.  GU: no dysuria, change in color of urine, no urgency or frequency.  No flank pain, no hematuria   MS:  No joint pain or swelling.  No decreased range of motion.  No back pain.  Psych:  No change in mood or affect. No depression or anxiety.  No memory loss.       SUBJECTIVE:  SOB   VITAL SIGNS: Temp:  [99.1 F (37.3 C)-99.3 F (37.4 C)] 99.1 F (37.3 C) (03/06 0429) Pulse Rate:  [78-108] 80 (03/06 1129) Resp:  [12-34] 19 (03/06 1129) BP: (122-162)/(61-107) 132/73 mmHg (03/06 1030) SpO2:  [88 %-100 %] 99 % (03/06 1129) FiO2 (%):  [0 %-40 %] 40 % (03/06 1129) HEMODYNAMICS:   VENTILATOR SETTINGS: Vent Mode:  [-] BIPAP FiO2 (%):  [0 %-40 %] 40 % Set Rate:  [12 bmp] 12 bmp PEEP:  [5 cmH20] 5 cmH20 Pressure Support:  [5 cmH20] 5 cmH20 INTAKE / OUTPUT:  Intake/Output Summary (Last 24 hours) at 03/24/14 1138 Last data filed at 03/24/14 1126  Gross per 24 hour  Intake      0 ml  Output   1925 ml  Net  -1925 ml    PHYSICAL EXAMINATION: General:  Morbidly obese on BIPAP , mild distress,  Neuro:  Anxious  HEENT:  BIPAP, dry mucosa  Cardiovascular:  SR, no m/r/g , tr edema Lungs:  Scants wheezes, BB rales , tachypneic /accessory use  Abdomen: obese, BS + ,no guarding  Musculoskeletal:  intact Skin:  Intact   LABS:  CBC  Recent Labs Lab 03/24/14 0159  WBC 5.2  HGB 11.5*  HCT 34.9*  PLT 246   Coag's No results for input(s): APTT, INR in the last 168 hours. BMET  Recent Labs Lab 03/24/14 0159  NA 137  K 3.3*  CL 104  CO2 26  BUN 14  CREATININE 1.13*  GLUCOSE 167*   Electrolytes  Recent Labs Lab 03/24/14 0159  CALCIUM 8.5   Sepsis Markers No results for input(s): LATICACIDVEN, PROCALCITON, O2SATVEN in the last 168 hours. ABG No results for input(s): PHART, PCO2ART, PO2ART in the last 168 hours. Liver Enzymes No results for input(s): AST, ALT, ALKPHOS,  BILITOT, ALBUMIN in the last 168 hours. Cardiac Enzymes No results for input(s): TROPONINI, PROBNP in the last 168 hours. Glucose No results for input(s): GLUCAP in the last 168 hours.  Imaging No results found.   ASSESSMENT / PLAN:  PULMONARY OETT A: Acute Resp failure secondary to Asthma exacerbation vs Bronchitis  MCT 2/9 unequivocal/hard to exclude Doubt PNA as neg on CT , no PE  OSA -sleep study pending  P:   Check ABG  Check cxr in am  BIPAP support  Low threshold to intubate  Check viral panel  Cont Steroids Cont ABX -deescalate if possible in am  Add Pulmicort  D/c Dulera and singulair  Admit to ICU    CARDIOVASCULAR  A: HTN -off ACE  P:  Hold b/p rx for now  Check bnp , troponin  Consider echo if elevated  Lovenox /DVT   RENAL A:  AKI -baseline scr 1.16 12/2013  Hypokalemia  P:   Hold ARB  Replete K +  Follow bmet   GASTROINTESTINAL A:   P:   NPO for now   HEMATOLOGIC A:  Anemia  P:  Monitor   INFECTIOUS A:  Bronchitis +/-Flu  Check lactate  Follow  Wbc/fever curve-if tr up pan cx  P:   Hope to de-escalate abx in am  Abx: rocephin/Zithro , start date3/6 , day 1/  ENDOCRINE A:  Hyperglycemia  P:   Tr on chem , if >180 add SSI   NEUROLOGIC A:  Anxious  P:   Ativan IV prn -low dose  Avoid oversedation     FAMILY  - Updates:   - Inter-disciplinary family meet or Palliative Care meeting due by:  3/13 day 7    TODAY'S SUMMARY:      PARRETT,TAMMY NP-C Pulmonary and Critical Care Medicine [facility] Pager: [phone]  03/24/2014, 11:38 AM  Attending:  I have seen and examined the patient with nurse practitioner/resident and agree with the note above.   Ms. Lowella GripDiaz-Ramirez was recently evaluated by my partner Dr. Marchelle Gearingamaswamy for shortness of breath and a reported history of asthma. She had a methacholine challenge test which was positive only at the highest level, so not strongly suggestive of asthma.  She now  has symptoms which are consistent with mild bronchitis and she is quite dyspneic.  However, she has very little objective evidence for an active severe pulmonary problem.  Specifically she has no infiltrate on her CT chest, and no pulmonary embolism.  Her oxygenation is normal and she is not wheezing. She may have mild vascular congestion on the CT chest, but this was not reported by the radiologist.  Her serum chemistry studies are normal.    At this time I am struggling to identify the cause of her high minute volume.  I question whether or not she is anxious but she denies this.  Though notably she breathes much more comfortably when she is asleep.  Plan to admit to the ICU for close observation, give low dose ativan, test for flu, get an ABG, and continue BIPAP.  Family updated bedside.  CC time 45 minutes  Heber CarolinaBrent Allecia Bells, MD [place] [facility] Pager: [phone] Cell: [phone] If no response, call [phone]

## 2014-03-24 NOTE — Progress Notes (Signed)
ED CM noted patient to have had 3 ED visits in the past 6 months for related complaint, PCP not to be listed in the system. Met with patient at bedside, confirmed information. Patient reports that Dr. York Ram is her PCP [phone] on [facility]. Patient presented to [facility] ED today for cough x  4 days, noted to have OAS bu  not on cpap. Seen by Dr. Theda Sers Pulmonary back in January for evaluation for asthma, and also seen by  [facility]iology for evaluation for cardiomyopathy. Patient states, she has [place] [employer] ED registration checked [place] track which confirmed, patient Medicaid is inactive, will notify Company secretary. Patient lives at home with son and is self care. Patient is being admitted, Unit CM will f/u concerning possible medication assistance.

## 2014-03-24 NOTE — ED Notes (Signed)
Dr. Kim at bedside   

## 2014-03-24 NOTE — ED Notes (Signed)
Respiratory at bedside.

## 2014-03-24 NOTE — ED Notes (Addendum)
Spoke with critical care and sts to take pt off bi pap and he will put orders in for tele bed. Due to normal ABG

## 2014-03-25 ENCOUNTER — Inpatient Hospital Stay (HOSPITAL_COMMUNITY): Payer: Medicaid Other

## 2014-03-25 DIAGNOSIS — R0600 Dyspnea, unspecified: Secondary | ICD-10-CM

## 2014-03-25 DIAGNOSIS — J80 Acute respiratory distress syndrome: Secondary | ICD-10-CM

## 2014-03-25 DIAGNOSIS — R0602 Shortness of breath: Secondary | ICD-10-CM

## 2014-03-25 DIAGNOSIS — J45901 Unspecified asthma with (acute) exacerbation: Principal | ICD-10-CM

## 2014-03-25 DIAGNOSIS — G8929 Other chronic pain: Secondary | ICD-10-CM | POA: Diagnosis present

## 2014-03-25 DIAGNOSIS — J9601 Acute respiratory failure with hypoxia: Secondary | ICD-10-CM

## 2014-03-25 DIAGNOSIS — R739 Hyperglycemia, unspecified: Secondary | ICD-10-CM

## 2014-03-25 LAB — COMPREHENSIVE METABOLIC PANEL
ALBUMIN: 3.7 g/dL (ref 3.5–5.2)
ALT: 22 U/L (ref 0–35)
AST: 26 U/L (ref 0–37)
Alkaline Phosphatase: 61 U/L (ref 39–117)
Anion gap: 6 (ref 5–15)
BILIRUBIN TOTAL: 0.6 mg/dL (ref 0.3–1.2)
BUN: 14 mg/dL (ref 6–23)
CALCIUM: 8.5 mg/dL (ref 8.4–10.5)
CO2: 26 mmol/L (ref 19–32)
CREATININE: 1.1 mg/dL (ref 0.50–1.10)
Chloride: 107 mmol/L (ref 96–112)
GFR calc Af Amer: 69 mL/min — ABNORMAL LOW (ref >90)
GFR, EST NON AFRICAN AMERICAN: 60 mL/min — AB (ref >90)
GLUCOSE: 151 mg/dL — AB (ref 70–99)
Potassium: 4.4 mmol/L (ref 3.5–5.1)
SODIUM: 139 mmol/L (ref 135–145)
Total Protein: 8.1 g/dL (ref 6.0–8.3)

## 2014-03-25 LAB — CBC
HCT: 39.4 % (ref 36.0–46.0)
HEMOGLOBIN: 12.8 g/dL (ref 12.0–15.0)
MCH: 27.8 pg (ref 26.0–34.0)
MCHC: 32.5 g/dL (ref 30.0–36.0)
MCV: 85.7 fL (ref 78.0–100.0)
Platelets: 284 10*3/uL (ref 150–400)
RBC: 4.6 MIL/uL (ref 3.87–5.11)
RDW: 16.3 % — AB (ref 11.5–15.5)
WBC: 6.2 10*3/uL (ref 4.0–10.5)

## 2014-03-25 LAB — INFLUENZA PANEL BY PCR (TYPE A & B)
H1N1 flu by pcr: NOT DETECTED
INFLAPCR: NEGATIVE
Influenza B By PCR: POSITIVE — AB

## 2014-03-25 LAB — LACTIC ACID, PLASMA: Lactic Acid, Venous: 2.5 mmol/L (ref 0.5–2.0)

## 2014-03-25 LAB — HEMOGLOBIN A1C
HEMOGLOBIN A1C: 6.6 % — AB (ref 4.8–5.6)
Mean Plasma Glucose: 143 mg/dL

## 2014-03-25 MED ORDER — ALBUTEROL SULFATE (2.5 MG/3ML) 0.083% IN NEBU: 2.5000 mg | INHALATION_SOLUTION | RESPIRATORY_TRACT | Status: DC | PRN

## 2014-03-25 MED ORDER — PNEUMOCOCCAL VAC POLYVALENT 25 MCG/0.5ML IJ INJ
0.5000 mL | INJECTION | INTRAMUSCULAR | Status: AC
  Administered 2014-03-26: 0.5 mL via INTRAMUSCULAR
  Filled 2014-03-25: qty 0.5

## 2014-03-25 MED ORDER — INFLUENZA VAC SPLIT QUAD 0.5 ML IM SUSY
0.5000 mL | PREFILLED_SYRINGE | INTRAMUSCULAR | Status: AC
  Administered 2014-03-26: 0.5 mL via INTRAMUSCULAR
  Filled 2014-03-25: qty 0.5

## 2014-03-25 MED ORDER — IPRATROPIUM-ALBUTEROL 0.5-2.5 (3) MG/3ML IN SOLN
3.0000 mL | Freq: Four times a day (QID) | RESPIRATORY_TRACT | Status: DC
  Administered 2014-03-26 (×2): 3 mL via RESPIRATORY_TRACT
  Filled 2014-03-25 (×2): qty 3

## 2014-03-25 MED ORDER — PREDNISONE 20 MG PO TABS
40.0000 mg | ORAL_TABLET | Freq: Every day | ORAL | Status: DC
  Administered 2014-03-26: 40 mg via ORAL
  Filled 2014-03-25 (×2): qty 2

## 2014-03-25 NOTE — Progress Notes (Signed)
UR COMPLETED  

## 2014-03-25 NOTE — Progress Notes (Signed)
Pt with nosebleed around 6 AM after RN placed humidifier on pt's nasal cannula last night. Lubricant had also been given to patient last night for nare irritation. Pt unable to tolerate nasal cannula at this time, so RN temporarily placed patient on Venturi Mask at 3L and 28% Fi02 (low setting). O2 sat  = 98% at this time. Will pass on to day shift RN and continue to monitor.

## 2014-03-25 NOTE — Consult Note (Signed)
PULMONARY / CRITICAL CARE MEDICINE   Name: Melanie Hess MRN: [id number] DOB: [birth year]    ADMISSION DATE:  03/24/2014 CONSULTATION DATE:  3/6  REFERRING MD :  TRH  CHIEF COMPLAINT:  Resp Distress/Asthma exacerbation   INITIAL PRESENTATION: 44 yo morbidly obese AAF presented with 4 days of dyspnea admitted by [facility] for possible PNA and asthma exacerbation decompensated in ER with increased WOB requiring BIPAP . PCCM consulted  MR office pt recent pulmonary consult    STUDIES:  2/9 MCT >unequivocal for Asthma -difficult to exclude  3/6 CTa CHest >>neg PE , atx , no PNA   SIGNIFICANT EVENTS: 3/6 BIPAP in ER    SUBJECTIVE:  Still feeling tired and weak, breathing much improved.  VITAL SIGNS: Temp:  [97.3 F (36.3 C)-97.7 F (36.5 C)] 97.6 F (36.4 C) (03/07 0539) Pulse Rate:  [60-92] 60 (03/07 0918) Resp:  [15-23] 18 (03/07 0918) BP: (135-164)/(73-90) 152/75 mmHg (03/07 0539) SpO2:  [90 %-100 %] 99 % (03/07 0927) FiO2 (%):  [28 %] 28 % (03/07 0639) Weight:  [166.289 kg (366 lb 9.6 oz)-166.63 kg (367 lb 5.6 oz)] 166.63 kg (367 lb 5.6 oz) (03/07 0539) HEMODYNAMICS:   VENTILATOR SETTINGS: Vent Mode:  [-]  FiO2 (%):  [28 %] 28 % INTAKE / OUTPUT:  Intake/Output Summary (Last 24 hours) at 03/25/14 1244 Last data filed at 03/25/14 0924  Gross per 24 hour  Intake    840 ml  Output    700 ml  Net    140 ml    PHYSICAL EXAMINATION: General:  Morbidly obese on BIPAP , mild distress,  Neuro:  Anxious  HEENT:  BIPAP, dry mucosa  Cardiovascular:  SR, no m/r/g , tr edema Lungs:  Scants wheezes, BB rales , tachypneic /accessory use  Abdomen: obese, BS + ,no guarding  Musculoskeletal:  intact Skin:  Intact   LABS:  CBC  Recent Labs Lab 03/24/14 0159 03/25/14 0617  WBC 5.2 6.2  HGB 11.5* 12.8  HCT 34.9* 39.4  PLT 246 284   Coag's No results for input(s): APTT, INR in the last 168 hours. BMET  Recent Labs Lab 03/24/14 0159 03/25/14 0617  NA 137 139   K 3.3* 4.4  CL 104 107  CO2 26 26  BUN 14 14  CREATININE 1.13* 1.10  GLUCOSE 167* 151*   Electrolytes  Recent Labs Lab 03/24/14 0159 03/25/14 0617  CALCIUM 8.5 8.5   Sepsis Markers  Recent Labs Lab 03/24/14 1310 03/25/14 0617  LATICACIDVEN 3.8* 2.5*   ABG  Recent Labs Lab 03/24/14 1420  PHART 7.384  PCO2ART 37.6  PO2ART 150.0*   Liver Enzymes  Recent Labs Lab 03/25/14 0617  AST 26  ALT 22  ALKPHOS 61  BILITOT 0.6  ALBUMIN 3.7   Cardiac Enzymes  Recent Labs Lab 03/24/14 1016 03/24/14 1313 03/24/14 2302  TROPONINI <0.03 <0.03 <0.03   Glucose No results for input(s): GLUCAP in the last 168 hours.  Imaging Ct Angio Chest Pe W/cm &/or Wo Cm  03/24/2014   CLINICAL DATA:  Difficulty breathing for 3 days. LEFT-sided chest pain. Cough. Asthma. Pulmonary embolism.  EXAM: CT ANGIOGRAPHY CHEST WITH CONTRAST  TECHNIQUE: Multidetector CT imaging of the chest was performed using the standard protocol during bolus administration of intravenous contrast. Multiplanar CT image reconstructions and MIPs were obtained to evaluate the vascular anatomy.  CONTRAST:  OMNIPAQUE IOHEXOL 350 MG/ML SOLN  COMPARISON:  03/24/2014.  01/01/2015.  FINDINGS: Bones: No aggressive osseous lesions.  Cardiovascular:  The contrast bolus appears adequate however the evaluation is markedly degraded by respiratory motion. This produces an appearance suggesting filling defects in the lower lobe pulmonary arteries. This probably represents partial volume averaging when viewed on reconstructed images. No central pulmonary embolus is identified in the main pulmonary artery or LEFT or RIGHT pulmonary arteries. Smaller vessels are inadequately evaluated due to the respiratory motion. Aorta and branch vessels appear within normal limits.  Lungs: Dependent atelectasis. Again, respiratory motion throughout the examination degrades the evaluation.  Central airways: Grossly patent.  Effusions: None.   Lymphadenopathy: No axillary adenopathy.  No mediastinal adenopathy.  Esophagus: Patulous distal thoracic esophagus and small hiatal hernia.  Upper abdomen: Grossly normal.  Other: None.  Review of the MIP images confirms the above findings.  IMPRESSION: 1. No central pulmonary embolus. Evaluation of pulmonary embolus severely degraded by respiratory motion artifact. 2. Scattered areas of atelectasis in the lung. No focal consolidation.   Electronically Signed   By: Andreas NewportGeoffrey  Lamke M.D.   On: 03/24/2014 11:24   Dg Chest Port 1 View  03/24/2014   CLINICAL DATA:  Acute onset of respiratory distress. Chest pain. Initial encounter.  EXAM: PORTABLE CHEST - 1 VIEW  COMPARISON:  Chest radiograph performed 01/27/2014  FINDINGS: The lungs are well-aerated. Right basilar airspace opacity is concerning for pneumonia. There is no evidence of pleural effusion or pneumothorax.  The cardiomediastinal silhouette is within normal limits. No acute osseous abnormalities are seen.  IMPRESSION: Right basilar airspace opacity is concerning for pneumonia.   Electronically Signed   By: Roanna RaiderJeffery  Chang M.D.   On: 03/24/2014 02:47     ASSESSMENT / PLAN:  Acute Resp failure secondary to Asthma exacerbation vs Bronchitis  MCT 2/9 unequivocal/hard to exclude Doubt PNA as neg on CT , no PE  OSA -sleep study pending   -Supplemental O2 to keep SpO2 greater than 92% -Repeat CXR -Taper steroids as able > pred 40 mg starting 3/8 -Continue nebs/Pulmicort -D/c Dulera and singulair  Flu B Bronchitis - Abx: rocephin/azithro, start date3/6 , day 2 - Consider adding tamiflu - F/u viral panel - Narrow as able  HTN - off ACE  - per primary  AKI -baseline scr 1.16 12/2013  Hypokalemia (resolved) - per primary  Hyperglycemia  - per primary  FAMILY  - Updates:   - Inter-disciplinary family meet or Palliative Care meeting due by:  3/13 day 7  PCCM will sign off, patient can follow up with pulmonary as scheduled for sleep  study  Joneen RoachPaul Hoffman, AGACNP-BC [place] [facility] Pager [phone] or [phone]  Appears better.  Continue treatment as above.  BiPAP to PRN.  O2 as needed.  Treat flu as above.  PCCM will sign off, please call back if needed.  Patient seen and examined, agree with above note.  I dictated the care and orders written for this patient under my direction.  Alyson ReedyWesam G Yacoub, MD [phone]

## 2014-03-25 NOTE — Progress Notes (Signed)
CRITICAL VALUE ALERT  Critical value received:  Lactic acid. Time of notification:  [phone]  Critical value read back:yes  Nurse who received alert:  Trey PaulaAna maria Lander Hess ,RN  MD notified (1st page):  Elmahi,MD  Time of first page:  0750  MD notified (2nd page):  Time of second page:  Responding MD:  Arthor CaptainElmahi ,MD  Time MD responded:  [phone]

## 2014-03-25 NOTE — Progress Notes (Signed)
[facility] PROGRESS NOTE   Melanie Hess ZOX:[id number] DOB: 1970-02-09 DOA: 03/24/2014 PCP: Pcp Not In System  HPI/Subjective: She is doing much better since yesterday, shortness of breath resolved. She is positive for flu type the, oxygen saturation in the higher 90s without oxygen.  Assessment/Plan: Active Problems:   Asthma   Respiratory distress   Anemia   Hyperglycemia   Asthma exacerbation   Acute respiratory failure   Acute respiratory distress    Acute respiratory failure with hypoxia Patient presented with shortness of breath, respiratory distress with respiratory rate in the 30s. Placed on BiPAP, Lasix given, IV fluids discontinued. EKG angiography did not show any acute events. She is positive for flu, started on Tamiflu.  Flu bronchitis Patient is positive for flu type be, she is on Tamiflu since 03/24/2014. Patient empirically on antibiotics including is Rocephin and azithromycin. Continue supportive management with mucolytics, bronchodilators, antitussives and oxygen as needed. Patient is on systemic steroids, no much of a wheezing I will switch to oral steroids.  Diabetes mellitus type 2 Newly diagnosed, hemoglobin A1c of 6.6. Patient never been on oral hypoglycemic agents, will start her on metformin on discharge.    Code Status: Full Code Family Communication: Plan discussed with the patient. Disposition Plan: Remains inpatient Diet: Diet Heart  Consultants:  None  Procedures:  None  Antibiotics:  Rocephin and azithromycin   Objective: Filed Vitals:   03/25/14 1338  BP:   Pulse: 67  Temp:   Resp: 20    Intake/Output Summary (Last 24 hours) at 03/25/14 1420 Last data filed at 03/25/14 1340  Gross per 24 hour  Intake   1200 ml  Output    100 ml  Net   1100 ml   Filed Weights   03/24/14 1659 03/25/14 0539  Weight: 166.289 kg (366 lb 9.6 oz) 166.63 kg (367 lb 5.6 oz)    Exam: General: Alert and awake,  oriented x3, not in any acute distress. HEENT: anicteric sclera, pupils reactive to light and accommodation, EOMI CVS: S1-S2 clear, no murmur rubs or gallops Chest: clear to auscultation bilaterally, no wheezing, rales or rhonchi Abdomen: soft nontender, nondistended, normal bowel sounds, no organomegaly Extremities: no cyanosis, clubbing or edema noted bilaterally Neuro: Cranial nerves II-XII intact, no focal neurological deficits  Data Reviewed: Basic Metabolic Panel:  Recent Labs Lab 03/24/14 0159 03/25/14 0617  NA 137 139  K 3.3* 4.4  CL 104 107  CO2 26 26  GLUCOSE 167* 151*  BUN 14 14  CREATININE 1.13* 1.10  CALCIUM 8.5 8.5   Liver Function Tests:  Recent Labs Lab 03/25/14 0617  AST 26  ALT 22  ALKPHOS 61  BILITOT 0.6  PROT 8.1  ALBUMIN 3.7   No results for input(s): LIPASE, AMYLASE in the last 168 hours. No results for input(s): AMMONIA in the last 168 hours. CBC:  Recent Labs Lab 03/24/14 0159 03/25/14 0617  WBC 5.2 6.2  HGB 11.5* 12.8  HCT 34.9* 39.4  MCV 86.0 85.7  PLT 246 284   Cardiac Enzymes:  Recent Labs Lab 03/24/14 1016 03/24/14 1313 03/24/14 2302  TROPONINI <0.03 <0.03 <0.03   BNP (last 3 results)  Recent Labs  01/27/14 2004 03/24/14 0159 03/24/14 1312  BNP 15.5 10.6 17.0    ProBNP (last 3 results)  Recent Labs  12/31/13 0935  PROBNP 36.0    CBG: No results for input(s): GLUCAP in the last 168 hours.  Micro No results found for this or any previous visit (  from the past 240 hour(s)).   Studies: Ct Angio Chest Pe W/cm &/or Wo Cm  03/24/2014   CLINICAL DATA:  Difficulty breathing for 3 days. LEFT-sided chest pain. Cough. Asthma. Pulmonary embolism.  EXAM: CT ANGIOGRAPHY CHEST WITH CONTRAST  TECHNIQUE: Multidetector CT imaging of the chest was performed using the standard protocol during bolus administration of intravenous contrast. Multiplanar CT image reconstructions and MIPs were obtained to evaluate the vascular  anatomy.  CONTRAST:  OMNIPAQUE IOHEXOL 350 MG/ML SOLN  COMPARISON:  03/24/2014.  01/01/2015.  FINDINGS: Bones: No aggressive osseous lesions.  Cardiovascular: The contrast bolus appears adequate however the evaluation is markedly degraded by respiratory motion. This produces an appearance suggesting filling defects in the lower lobe pulmonary arteries. This probably represents partial volume averaging when viewed on reconstructed images. No central pulmonary embolus is identified in the main pulmonary artery or LEFT or RIGHT pulmonary arteries. Smaller vessels are inadequately evaluated due to the respiratory motion. Aorta and branch vessels appear within normal limits.  Lungs: Dependent atelectasis. Again, respiratory motion throughout the examination degrades the evaluation.  Central airways: Grossly patent.  Effusions: None.  Lymphadenopathy: No axillary adenopathy.  No mediastinal adenopathy.  Esophagus: Patulous distal thoracic esophagus and small hiatal hernia.  Upper abdomen: Grossly normal.  Other: None.  Review of the MIP images confirms the above findings.  IMPRESSION: 1. No central pulmonary embolus. Evaluation of pulmonary embolus severely degraded by respiratory motion artifact. 2. Scattered areas of atelectasis in the lung. No focal consolidation.   Electronically Signed   By: Andreas Newport M.D.   On: 03/24/2014 11:24   Dg Chest Port 1 View  03/24/2014   CLINICAL DATA:  Acute onset of respiratory distress. Chest pain. Initial encounter.  EXAM: PORTABLE CHEST - 1 VIEW  COMPARISON:  Chest radiograph performed 01/27/2014  FINDINGS: The lungs are well-aerated. Right basilar airspace opacity is concerning for pneumonia. There is no evidence of pleural effusion or pneumothorax.  The cardiomediastinal silhouette is within normal limits. No acute osseous abnormalities are seen.  IMPRESSION: Right basilar airspace opacity is concerning for pneumonia.   Electronically Signed   By: Roanna Raider M.D.    On: 03/24/2014 02:47    Scheduled Meds: . azithromycin  500 mg Intravenous Q24H  . budesonide  0.25 mg Nebulization BID  . cefTRIAXone (ROCEPHIN)  IV  1 g Intravenous Q24H  . enoxaparin (LOVENOX) injection  80 mg Subcutaneous Q24H  . guaiFENesin  1,200 mg Oral BID  . ipratropium-albuterol  3 mL Nebulization Q4H  . methylPREDNISolone (SOLU-MEDROL) injection  80 mg Intravenous 3 times per day  . oseltamivir  75 mg Oral BID  . [START ON 03/26/2014] predniSONE  40 mg Oral Q breakfast  . sodium chloride  3 mL Intravenous Q12H   Continuous Infusions:       Time spent: 35 minutes    [facility] A  [facility] Pager [phone] If 7PM-7AM, please contact night-coverage at [email or web address], password [email or web address] 03/25/2014, 2:20 PM  LOS: 1 day

## 2014-03-26 DIAGNOSIS — R072 Precordial pain: Secondary | ICD-10-CM

## 2014-03-26 DIAGNOSIS — E119 Type 2 diabetes mellitus without complications: Secondary | ICD-10-CM

## 2014-03-26 LAB — GLUCOSE, CAPILLARY: GLUCOSE-CAPILLARY: 140 mg/dL — AB (ref 70–99)

## 2014-03-26 MED ORDER — PREDNISONE 10 MG PO TABS: ORAL_TABLET | ORAL | Status: DC

## 2014-03-26 MED ORDER — PERFLUTREN LIPID MICROSPHERE
1.0000 mL | INTRAVENOUS | Status: AC | PRN
  Administered 2014-03-26: 4 mL via INTRAVENOUS
  Filled 2014-03-26: qty 10

## 2014-03-26 MED ORDER — BENZONATATE 100 MG PO CAPS
200.0000 mg | ORAL_CAPSULE | Freq: Three times a day (TID) | ORAL | Status: DC | PRN
  Administered 2014-03-26 (×2): 200 mg via ORAL
  Filled 2014-03-26 (×3): qty 2

## 2014-03-26 MED ORDER — INSULIN ASPART 100 UNIT/ML ~~LOC~~ SOLN: 0.0000 [IU] | Freq: Three times a day (TID) | SUBCUTANEOUS | Status: DC

## 2014-03-26 MED ORDER — METFORMIN HCL 1000 MG PO TABS: 1000.0000 mg | ORAL_TABLET | Freq: Two times a day (BID) | ORAL | Status: DC

## 2014-03-26 MED ORDER — AZITHROMYCIN 500 MG PO TABS: 500.0000 mg | ORAL_TABLET | Freq: Every day | ORAL | Status: DC

## 2014-03-26 MED ORDER — OSELTAMIVIR PHOSPHATE 75 MG PO CAPS: 75.0000 mg | ORAL_CAPSULE | Freq: Two times a day (BID) | ORAL | Status: DC

## 2014-03-26 MED ORDER — SODIUM CHLORIDE 0.9 % IV SOLN
INTRAVENOUS | Status: DC | PRN
  Administered 2014-03-26: 10 mL/h via INTRAVENOUS

## 2014-03-26 NOTE — Discharge Summary (Signed)
Physician Discharge Summary  Melanie Hess WUJ:[id number] DOB: 1970-10-30 DOA: 03/24/2014  PCP: Lerry Liner  Admit date: 03/24/2014 Discharge date: 03/26/2014  Time spent: 40 minutes  Recommendations for Outpatient Follow-up:  1. Follow-up with primary care physician within one week.  Discharge Diagnoses:  Active Problems:   Asthma   Respiratory distress   Anemia   Diabetes mellitus type 2, controlled, without complications   Asthma exacerbation   Acute respiratory failure   Acute respiratory distress   Chronic pain   SOB (shortness of breath)   Discharge Condition: Stable  Diet recommendation: Carbohydrate modified diet  Filed Weights   03/24/14 1659 03/25/14 0539 03/26/14 0620  Weight: 166.289 kg (366 lb 9.6 oz) 166.63 kg (367 lb 5.6 oz) 167.241 kg (368 lb 11.2 oz)    History of present illness:  44 yo female with OSA (not on cpap), Asthma , apparently c/o cough beginning about 4 days ago. Pt has sputum but can;t tell what color the phlegm is. Pt notes slight chest pain " sharp" worse with coughing and palpation. Pt presented to ED for evaluation and CXR showed evidence of infiltrate. Pt will be admitted for pneumonia, asthma exacerbation.    Hospital Course:   Acute respiratory failure with hypoxia Patient presented with shortness of breath, respiratory distress with respiratory rate in the 30s. Placed on BiPAP, Lasix given, IV fluids discontinued. EKG angiography did not show any acute events. She is positive for type B started on Tamiflu. No changes continue current management.  Flu bronchitis Patient is positive for flu type B, she is on Tamiflu since 03/24/2014. Patient empirically on antibiotics including is Rocephin and azithromycin. Continue supportive management with mucolytics, bronchodilators, antitussives and oxygen as needed. On discharge discharge and Tamiflu to complete a total of 5 days. Steroids taper, no need for antibiotics on  discharge.  Diabetes mellitus type 2 Newly diagnosed, hemoglobin A1c of 6.6. Patient never been on oral hypoglycemic agents, starting metformin 1000 mg twice a day. Adviced about losing weight, follow-up with primary care physician as outpatient.   Procedures:  None  Consultations:  None  Discharge Exam: Filed Vitals:   03/26/14 0620  BP: 144/78  Pulse: 72  Temp: 97.8 F (36.6 C)  Resp: 20   General: Alert and awake, oriented x3, not in any acute distress. HEENT: anicteric sclera, pupils reactive to light and accommodation, EOMI CVS: S1-S2 clear, no murmur rubs or gallops Chest: clear to auscultation bilaterally, no wheezing, rales or rhonchi Abdomen: soft nontender, nondistended, normal bowel sounds, no organomegaly Extremities: no cyanosis, clubbing or edema noted bilaterally Neuro: Cranial nerves II-XII intact, no focal neurological deficits  Discharge Instructions   Discharge Instructions    Diet Carb Modified    Complete by:  As directed      Increase activity slowly    Complete by:  As directed           Current Discharge Medication List    START taking these medications   Details  metFORMIN (GLUCOPHAGE) 1000 MG tablet Take 1 tablet (1,000 mg total) by mouth 2 (two) times daily with a meal. Qty: 60 tablet, Refills: 0    oseltamivir (TAMIFLU) 75 MG capsule Take 1 capsule (75 mg total) by mouth 2 (two) times daily. Qty: 8 capsule, Refills: 0      CONTINUE these medications which have CHANGED   Details  predniSONE (DELTASONE) 10 MG tablet Take 6-5-4-3-2-1 PO daily till gone Qty: 21 tablet, Refills: 0  CONTINUE these medications which have NOT CHANGED   Details  albuterol (PROVENTIL HFA;VENTOLIN HFA) 108 (90 BASE) MCG/ACT inhaler Inhale 2 puffs into the lungs every 6 (six) hours as needed. Shortness of breath    albuterol (PROVENTIL) (2.5 MG/3ML) 0.083% nebulizer solution Take 2.5 mg by nebulization every 6 (six) hours as needed for wheezing or  shortness of breath.    losartan (COZAAR) 25 MG tablet Take 1 tablet (25 mg total) by mouth daily. Qty: 30 tablet, Refills: 5    montelukast (SINGULAIR) 10 MG tablet Take 10 mg by mouth at bedtime.      STOP taking these medications     acetaminophen-codeine (TYLENOL #3) 300-30 MG per tablet        Allergies  Allergen Reactions  . Aspirin Swelling  . Penicillins Swelling  . Percocet [Oxycodone-Acetaminophen] Hives and Itching  . Tramadol Nausea Only  . Vicodin [Hydrocodone-Acetaminophen] Hives and Itching   Follow-up Information    Follow up with Jearld Lesch, MD In 1 week.   Specialty:  Specialist   Contact information:   [address] [place] [address] [phone]        The results of significant diagnostics from this hospitalization (including imaging, microbiology, ancillary and laboratory) are listed below for reference.    Significant Diagnostic Studies: Ct Angio Chest Pe W/cm &/or Wo Cm  03/24/2014   CLINICAL DATA:  Difficulty breathing for 3 days. LEFT-sided chest pain. Cough. Asthma. Pulmonary embolism.  EXAM: CT ANGIOGRAPHY CHEST WITH CONTRAST  TECHNIQUE: Multidetector CT imaging of the chest was performed using the standard protocol during bolus administration of intravenous contrast. Multiplanar CT image reconstructions and MIPs were obtained to evaluate the vascular anatomy.  CONTRAST:  OMNIPAQUE IOHEXOL 350 MG/ML SOLN  COMPARISON:  03/24/2014.  01/01/2015.  FINDINGS: Bones: No aggressive osseous lesions.  Cardiovascular: The contrast bolus appears adequate however the evaluation is markedly degraded by respiratory motion. This produces an appearance suggesting filling defects in the lower lobe pulmonary arteries. This probably represents partial volume averaging when viewed on reconstructed images. No central pulmonary embolus is identified in the main pulmonary artery or LEFT or RIGHT pulmonary arteries. Smaller vessels are inadequately  evaluated due to the respiratory motion. Aorta and branch vessels appear within normal limits.  Lungs: Dependent atelectasis. Again, respiratory motion throughout the examination degrades the evaluation.  Central airways: Grossly patent.  Effusions: None.  Lymphadenopathy: No axillary adenopathy.  No mediastinal adenopathy.  Esophagus: Patulous distal thoracic esophagus and small hiatal hernia.  Upper abdomen: Grossly normal.  Other: None.  Review of the MIP images confirms the above findings.  IMPRESSION: 1. No central pulmonary embolus. Evaluation of pulmonary embolus severely degraded by respiratory motion artifact. 2. Scattered areas of atelectasis in the lung. No focal consolidation.   Electronically Signed   By: Andreas Newport M.D.   On: 03/24/2014 11:24   Dg Chest Port 1 View  03/25/2014   CLINICAL DATA:  Short of breath  EXAM: PORTABLE CHEST - 1 VIEW  COMPARISON:  03/24/2014  FINDINGS: Image quality limited due to large patient size and portable technique.  Lungs are grossly clear. Negative for pneumonia or effusion. Negative for heart failure.  IMPRESSION: No active disease.   Electronically Signed   By: Marlan Palau M.D.   On: 03/25/2014 14:44   Dg Chest Port 1 View  03/24/2014   CLINICAL DATA:  Acute onset of respiratory distress. Chest pain. Initial encounter.  EXAM: PORTABLE CHEST - 1 VIEW  COMPARISON:  Chest radiograph performed 01/27/2014  FINDINGS: The lungs are well-aerated. Right basilar airspace opacity is concerning for pneumonia. There is no evidence of pleural effusion or pneumothorax.  The cardiomediastinal silhouette is within normal limits. No acute osseous abnormalities are seen.  IMPRESSION: Right basilar airspace opacity is concerning for pneumonia.   Electronically Signed   By: Roanna RaiderJeffery  Chang M.D.   On: 03/24/2014 02:47    Microbiology: No results found for this or any previous visit (from the past 240 hour(s)).   Labs: Basic Metabolic Panel:  Recent Labs Lab  03/24/14 0159 03/25/14 0617  NA 137 139  K 3.3* 4.4  CL 104 107  CO2 26 26  GLUCOSE 167* 151*  BUN 14 14  CREATININE 1.13* 1.10  CALCIUM 8.5 8.5   Liver Function Tests:  Recent Labs Lab 03/25/14 0617  AST 26  ALT 22  ALKPHOS 61  BILITOT 0.6  PROT 8.1  ALBUMIN 3.7   No results for input(s): LIPASE, AMYLASE in the last 168 hours. No results for input(s): AMMONIA in the last 168 hours. CBC:  Recent Labs Lab 03/24/14 0159 03/25/14 0617  WBC 5.2 6.2  HGB 11.5* 12.8  HCT 34.9* 39.4  MCV 86.0 85.7  PLT 246 284   Cardiac Enzymes:  Recent Labs Lab 03/24/14 1016 03/24/14 1313 03/24/14 2302  TROPONINI <0.03 <0.03 <0.03   BNP: BNP (last 3 results)  Recent Labs  01/27/14 2004 03/24/14 0159 03/24/14 1312  BNP 15.5 10.6 17.0    ProBNP (last 3 results)  Recent Labs  12/31/13 0935  PROBNP 36.0    CBG: No results for input(s): GLUCAP in the last 168 hours.     Signed:  Xinyi Batton A  [facility] 03/26/2014, 4:04 PM

## 2014-03-26 NOTE — Progress Notes (Signed)
[facility] PROGRESS NOTE   Melanie Hess OZD:[id number] DOB: 1970/08/08 DOA: 03/24/2014 PCP: Pcp Not In System  HPI/Subjective: Patient will probably be discharged later today or tomorrow morning. Complaining about not feeling very well, still has a lot of upper airway congestion and shortness of breath.  Assessment/Plan: Active Problems:   Asthma   Respiratory distress   Anemia   Hyperglycemia   Asthma exacerbation   Acute respiratory failure   Acute respiratory distress   Chronic pain   SOB (shortness of breath)    Acute respiratory failure with hypoxia Patient presented with shortness of breath, respiratory distress with respiratory rate in the 30s. Placed on BiPAP, Lasix given, IV fluids discontinued. EKG angiography did not show any acute events. She is positive for flu, started on Tamiflu. No changes continue current management.  Flu bronchitis Patient is positive for flu type be, she is on Tamiflu since 03/24/2014. Patient empirically on antibiotics including is Rocephin and azithromycin. Continue supportive management with mucolytics, bronchodilators, antitussives and oxygen as needed. Patient is on systemic steroids, no much of a wheezing I will switch to oral steroids.  Diabetes mellitus type 2 Newly diagnosed, hemoglobin A1c of 6.6. Patient never been on oral hypoglycemic agents, will start her on metformin on discharge. Started SSI   Code Status: Full Code Family Communication: Plan discussed with the patient. Disposition Plan: Remains inpatient Diet: Diet Heart  Consultants:  None  Procedures:  None  Antibiotics:  Rocephin and azithromycin   Objective: Filed Vitals:   03/26/14 0620  BP: 144/78  Pulse: 72  Temp: 97.8 F (36.6 C)  Resp: 20    Intake/Output Summary (Last 24 hours) at 03/26/14 1127 Last data filed at 03/26/14 0700  Gross per 24 hour  Intake    960 ml  Output    100 ml  Net    860 ml   Filed Weights     03/24/14 1659 03/25/14 0539 03/26/14 0620  Weight: 166.289 kg (366 lb 9.6 oz) 166.63 kg (367 lb 5.6 oz) 167.241 kg (368 lb 11.2 oz)    Exam: General: Alert and awake, oriented x3, not in any acute distress. HEENT: anicteric sclera, pupils reactive to light and accommodation, EOMI CVS: S1-S2 clear, no murmur rubs or gallops Chest: clear to auscultation bilaterally, no wheezing, rales or rhonchi Abdomen: soft nontender, nondistended, normal bowel sounds, no organomegaly Extremities: no cyanosis, clubbing or edema noted bilaterally Neuro: Cranial nerves II-XII intact, no focal neurological deficits  Data Reviewed: Basic Metabolic Panel:  Recent Labs Lab 03/24/14 0159 03/25/14 0617  NA 137 139  K 3.3* 4.4  CL 104 107  CO2 26 26  GLUCOSE 167* 151*  BUN 14 14  CREATININE 1.13* 1.10  CALCIUM 8.5 8.5   Liver Function Tests:  Recent Labs Lab 03/25/14 0617  AST 26  ALT 22  ALKPHOS 61  BILITOT 0.6  PROT 8.1  ALBUMIN 3.7   No results for input(s): LIPASE, AMYLASE in the last 168 hours. No results for input(s): AMMONIA in the last 168 hours. CBC:  Recent Labs Lab 03/24/14 0159 03/25/14 0617  WBC 5.2 6.2  HGB 11.5* 12.8  HCT 34.9* 39.4  MCV 86.0 85.7  PLT 246 284   Cardiac Enzymes:  Recent Labs Lab 03/24/14 1016 03/24/14 1313 03/24/14 2302  TROPONINI <0.03 <0.03 <0.03   BNP (last 3 results)  Recent Labs  01/27/14 2004 03/24/14 0159 03/24/14 1312  BNP 15.5 10.6 17.0    ProBNP (last 3 results)  Recent  Labs  12/31/13 0935  PROBNP 36.0    CBG: No results for input(s): GLUCAP in the last 168 hours.  Micro No results found for this or any previous visit (from the past 240 hour(s)).   Studies: Dg Chest Port 1 View  03/25/2014   CLINICAL DATA:  Short of breath  EXAM: PORTABLE CHEST - 1 VIEW  COMPARISON:  03/24/2014  FINDINGS: Image quality limited due to large patient size and portable technique.  Lungs are grossly clear. Negative for pneumonia or  effusion. Negative for heart failure.  IMPRESSION: No active disease.   Electronically Signed   By: Marlan Palauharles  Clark M.D.   On: 03/25/2014 14:44    Scheduled Meds: . [START ON 03/27/2014] azithromycin  500 mg Oral Daily  . budesonide  0.25 mg Nebulization BID  . cefTRIAXone (ROCEPHIN)  IV  1 g Intravenous Q24H  . enoxaparin (LOVENOX) injection  80 mg Subcutaneous Q24H  . guaiFENesin  1,200 mg Oral BID  . Influenza vac split quadrivalent PF  0.5 mL Intramuscular Tomorrow-1000  . ipratropium-albuterol  3 mL Nebulization QID  . oseltamivir  75 mg Oral BID  . pneumococcal 23 valent vaccine  0.5 mL Intramuscular Tomorrow-1000  . predniSONE  40 mg Oral Q breakfast  . sodium chloride  3 mL Intravenous Q12H   Continuous Infusions:       Time spent: 35 minutes    [facility] TexasELMAHI,Cephus Tupy A  [facility] Pager [phone] If 7PM-7AM, please contact night-coverage at [email or web address], password [email or web address] 03/26/2014, 11:27 AM  LOS: 2 days

## 2014-03-26 NOTE — Progress Notes (Signed)
  Echocardiogram 2D Echocardiogram with Definity has been performed.  Ronika Kelson FRANCES 03/26/2014, 10:31 AM

## 2014-03-26 NOTE — Progress Notes (Signed)
D/c instructions reviewed with pt. Copy of instructions, scripts and letter for her work given to pt. Also discussed diabetic, low carb diet, and metformin medication with handouts of diet and med. Pt to follow up with clinic/PCP. Pt to be taken out for discharge, family down to get car.

## 2014-03-26 NOTE — Progress Notes (Signed)
Pt d/c'd via wheelchair with belongings, escorted by unit NT.  

## 2014-03-27 ENCOUNTER — Encounter (HOSPITAL_COMMUNITY): Payer: Self-pay | Admitting: Emergency Medicine

## 2014-03-27 ENCOUNTER — Emergency Department (HOSPITAL_COMMUNITY)
Admission: EM | Admit: 2014-03-27 | Discharge: 2014-03-27 | Disposition: A | Discharge status: Home / Self Care | Payer: Medicaid Other | Attending: Emergency Medicine | Admitting: Emergency Medicine

## 2014-03-27 ENCOUNTER — Emergency Department (HOSPITAL_COMMUNITY): Payer: Medicaid Other

## 2014-03-27 DIAGNOSIS — Z8669 Personal history of other diseases of the nervous system and sense organs: Secondary | ICD-10-CM | POA: Insufficient documentation

## 2014-03-27 DIAGNOSIS — Z79899 Other long term (current) drug therapy: Secondary | ICD-10-CM | POA: Diagnosis not present

## 2014-03-27 DIAGNOSIS — I10 Essential (primary) hypertension: Secondary | ICD-10-CM | POA: Insufficient documentation

## 2014-03-27 DIAGNOSIS — J45901 Unspecified asthma with (acute) exacerbation: Secondary | ICD-10-CM | POA: Diagnosis not present

## 2014-03-27 DIAGNOSIS — Z880 Allergy status to penicillin: Secondary | ICD-10-CM | POA: Diagnosis not present

## 2014-03-27 DIAGNOSIS — R059 Cough, unspecified: Secondary | ICD-10-CM

## 2014-03-27 DIAGNOSIS — J111 Influenza due to unidentified influenza virus with other respiratory manifestations: Secondary | ICD-10-CM | POA: Diagnosis not present

## 2014-03-27 DIAGNOSIS — Z8739 Personal history of other diseases of the musculoskeletal system and connective tissue: Secondary | ICD-10-CM | POA: Diagnosis not present

## 2014-03-27 DIAGNOSIS — Z7952 Long term (current) use of systemic steroids: Secondary | ICD-10-CM | POA: Insufficient documentation

## 2014-03-27 DIAGNOSIS — R05 Cough: Secondary | ICD-10-CM

## 2014-03-27 DIAGNOSIS — R0602 Shortness of breath: Secondary | ICD-10-CM | POA: Diagnosis present

## 2014-03-27 DIAGNOSIS — M791 Myalgia, unspecified site: Secondary | ICD-10-CM

## 2014-03-27 DIAGNOSIS — J101 Influenza due to other identified influenza virus with other respiratory manifestations: Secondary | ICD-10-CM

## 2014-03-27 LAB — CBC WITH DIFFERENTIAL/PLATELET
Basophils Absolute: 0 10*3/uL (ref 0.0–0.1)
Basophils Relative: 0 % (ref 0–1)
Eosinophils Absolute: 0 10*3/uL (ref 0.0–0.7)
Eosinophils Relative: 0 % (ref 0–5)
HCT: 38.1 % (ref 36.0–46.0)
Hemoglobin: 12.7 g/dL (ref 12.0–15.0)
Lymphocytes Relative: 56 % — ABNORMAL HIGH (ref 12–46)
Lymphs Abs: 3.1 10*3/uL (ref 0.7–4.0)
MCH: 28.1 pg (ref 26.0–34.0)
MCHC: 33.3 g/dL (ref 30.0–36.0)
MCV: 84.3 fL (ref 78.0–100.0)
Monocytes Absolute: 0.3 10*3/uL (ref 0.1–1.0)
Monocytes Relative: 6 % (ref 3–12)
Neutro Abs: 2.1 10*3/uL (ref 1.7–7.7)
Neutrophils Relative %: 38 % — ABNORMAL LOW (ref 43–77)
Platelets: 246 10*3/uL (ref 150–400)
RBC: 4.52 MIL/uL (ref 3.87–5.11)
RDW: 15.5 % (ref 11.5–15.5)
WBC: 5.6 10*3/uL (ref 4.0–10.5)

## 2014-03-27 LAB — BASIC METABOLIC PANEL
Anion gap: 9 (ref 5–15)
BUN: 13 mg/dL (ref 6–23)
CO2: 24 mmol/L (ref 19–32)
Calcium: 8.1 mg/dL — ABNORMAL LOW (ref 8.4–10.5)
Chloride: 105 mmol/L (ref 96–112)
Creatinine, Ser: 0.96 mg/dL (ref 0.50–1.10)
GFR calc Af Amer: 82 mL/min — ABNORMAL LOW (ref >90)
GFR calc non Af Amer: 70 mL/min — ABNORMAL LOW (ref >90)
Glucose, Bld: 88 mg/dL (ref 70–99)
Potassium: 3.3 mmol/L — ABNORMAL LOW (ref 3.5–5.1)
Sodium: 138 mmol/L (ref 135–145)

## 2014-03-27 LAB — I-STAT TROPONIN, ED: Troponin i, poc: 0 ng/mL (ref 0.00–0.08)

## 2014-03-27 LAB — BRAIN NATRIURETIC PEPTIDE: B Natriuretic Peptide: 13 pg/mL (ref 0.0–100.0)

## 2014-03-27 MED ORDER — ONDANSETRON HCL 4 MG/2ML IJ SOLN
4.0000 mg | Freq: Once | INTRAMUSCULAR | Status: AC
  Administered 2014-03-27: 4 mg via INTRAVENOUS
  Filled 2014-03-27: qty 2

## 2014-03-27 MED ORDER — SODIUM CHLORIDE 0.9 % IV BOLUS (SEPSIS)
500.0000 mL | Freq: Once | INTRAVENOUS | Status: AC
  Administered 2014-03-27: 500 mL via INTRAVENOUS

## 2014-03-27 MED ORDER — PREDNISONE 20 MG PO TABS
60.0000 mg | ORAL_TABLET | Freq: Once | ORAL | Status: AC
Start: 2014-03-27 — End: 2014-03-27
  Administered 2014-03-27: 60 mg via ORAL
  Filled 2014-03-27: qty 3

## 2014-03-27 MED ORDER — IPRATROPIUM-ALBUTEROL 0.5-2.5 (3) MG/3ML IN SOLN
3.0000 mL | Freq: Once | RESPIRATORY_TRACT | Status: AC
  Administered 2014-03-27: 3 mL via RESPIRATORY_TRACT
  Filled 2014-03-27: qty 3

## 2014-03-27 MED ORDER — ALBUTEROL (5 MG/ML) CONTINUOUS INHALATION SOLN
10.0000 mg/h | INHALATION_SOLUTION | RESPIRATORY_TRACT | Status: DC
  Administered 2014-03-27: 10 mg/h via RESPIRATORY_TRACT
  Filled 2014-03-27: qty 20

## 2014-03-27 MED ORDER — ACETAMINOPHEN 500 MG PO TABS
1000.0000 mg | ORAL_TABLET | Freq: Once | ORAL | Status: AC
  Administered 2014-03-27: 1000 mg via ORAL
  Filled 2014-03-27: qty 2

## 2014-03-27 NOTE — ED Notes (Signed)
Case manager at bedside 

## 2014-03-27 NOTE — ED Notes (Addendum)
Pt from home via ems, discharged yesterday with influenza, states she became increasingly sob this am. Did some breathing tx at home with no relief. 5mg  albuterol given by ems. Labored, tachypneic respirations, o2 sat 100 on ra. Pt alert, oriented.

## 2014-03-27 NOTE — ED Notes (Signed)
Pt ambulated to restroom and down hallway, tolerated well, 02 sat 100% consistently.

## 2014-03-27 NOTE — Progress Notes (Signed)
ED CM noted patient have had 4 ED visits and 1 inpatient hospitalization. Reviewed patient's record, she was discharged yesterday 3/8 from [facility] after being admitted for respiratory distress secondary to influenza type B.  Met with patient at bedside, confirmed information. Patient reports not filling her discharged prescriptions yesterday after discharge, she confirms having a working nebulizer with albuterol solution at home. Patient states, she is unsure if she has a refill on her Singular, CM contacted [facility] pharmacy patient has 4 refills to date. Patient made aware.  Reijterated to patient to contact her PCP Dr. [name] to schedule a  HFU visit, patient verbalized understanding teach back done. No further ED CM needs didentified

## 2014-03-27 NOTE — ED Provider Notes (Signed)
CSN: [id number]     Arrival date & time 03/27/14  1722 History   First MD Initiated Contact with Patient 03/27/14 1735     Chief Complaint  Patient presents with  . Shortness of Breath  . Chest Pain     (Consider location/radiation/quality/duration/timing/severity/associated sxs/prior Treatment) HPI Pt is a 44yo female with hx of asthma, HTN, and OSA, discharged from hospital yesterday after being admitted for respiratory distress secondary to influenza type B, pt was also newly diagnosed with DM with a Hgb A1C of 6.6. Pt states she was discharged yesterday because she stated she "really" wanted to go home.  Pt states although she was anxious to be discharged home, she felt too bad yesterday to have her prescriptions for prednisone, tamiflu, and metformin filled. Pt presenting today with c/o severe sharp, stabbing, centralized chest pain and tightness that is worse with inspiratory.  Reports dry cough and generalized body weakness and fatigue.  Denies taking any acetaminophen or ibuprofen at home as she did not have any to take. Reports nausea but denies vomiting or diarrhea.  Pt states she would like to be admitted again as she "feels like shit." pt reports having decreased PO intake due to nausea.   Past Medical History  Diagnosis Date  . Migraine   . Asthma   . Ankle fracture, right 2001  . Plantar fasciitis of right foot   . Hypertension   . OSA (obstructive sleep apnea)    Past Surgical History  Procedure Laterality Date  . Tubal ligation     Family History  Problem Relation Age of Onset  . Breast cancer Maternal Aunt   . Brain cancer Cousin   . Hypertension Mother    History  Substance Use Topics  . Smoking status: Never Smoker   . Smokeless tobacco: Never Used  . Alcohol Use: No   OB History    No data available     Review of Systems  Constitutional: Positive for fever ( subjective), chills and fatigue.  Respiratory: Positive for cough and shortness of breath.    Cardiovascular: Positive for chest pain ( centralized). Negative for palpitations and leg swelling.  Gastrointestinal: Positive for nausea and abdominal pain. Negative for vomiting, diarrhea and constipation.  Genitourinary: Negative for dysuria, frequency and flank pain.  Neurological: Positive for weakness.  All other systems reviewed and are negative.     Allergies  Aspirin; Nsaids; Meloxicam; Penicillins; Percocet; Tramadol; and Vicodin  Home Medications   Prior to Admission medications   Medication Sig Start Date End Date Taking? Authorizing Provider  albuterol (PROVENTIL HFA;VENTOLIN HFA) 108 (90 BASE) MCG/ACT inhaler Inhale 2 puffs into the lungs every 6 (six) hours as needed. Shortness of breath   Yes Historical Provider, MD  albuterol (PROVENTIL) (2.5 MG/3ML) 0.083% nebulizer solution Take 2.5 mg by nebulization every 6 (six) hours as needed for wheezing or shortness of breath.   Yes Historical Provider, MD  losartan (COZAAR) 25 MG tablet Take 1 tablet (25 mg total) by mouth daily. 02/14/14  Yes Kalman Shan, MD  oseltamivir (TAMIFLU) 75 MG capsule Take 1 capsule (75 mg total) by mouth 2 (two) times daily. 03/26/14  Yes Clydia Llano, MD  metFORMIN (GLUCOPHAGE) 1000 MG tablet Take 1 tablet (1,000 mg total) by mouth 2 (two) times daily with a meal. 03/26/14   Clydia Llano, MD  montelukast (SINGULAIR) 10 MG tablet Take 10 mg by mouth at bedtime.    Historical Provider, MD  predniSONE (DELTASONE) 10 MG tablet Take  6-5-4-3-2-1 PO daily till gone 03/26/14   Mutaz Elmahi, MD   BP 117/80 mmHg  Pulse 81  Temp(Src) 98.6 F (37 C) (Oral)  Resp 22  SpO2 100%  LMP 02/25/2014 Physical Exam  Constitutional: She appears well-developed and well-nourished. She appears distressed.  Morbidly obese female sitting in exam bed, appears uncomfortable, mildly fatigued  HENT:  Head: Normocephalic and atraumatic.  Eyes: Conjunctivae are normal. No scleral icterus.  Neck: Normal range of motion.   Cardiovascular: Normal rate, regular rhythm and normal heart sounds.   Pulmonary/Chest: Effort normal. No respiratory distress. She has wheezes. She has no rales. She exhibits no tenderness.  Mild shortness of breath between sentences. No accessory muscle use.  Lungs: faint expiratory wheeze in upper lung fields bilaterally.  Decreased breath sounds in lower lung fields. No chest wall tenderness  Abdominal: Soft. Bowel sounds are normal. She exhibits no distension and no mass. There is no tenderness. There is no rebound and no guarding.  Musculoskeletal: Normal range of motion.  Neurological: She is alert.  Skin: Skin is warm and dry. She is not diaphoretic.  Nursing note and vitals reviewed.   ED Course  Procedures (including critical care time) Labs Review Labs Reviewed  BASIC METABOLIC PANEL - Abnormal; Notable for the following:    Potassium 3.3 (*)    Calcium 8.1 (*)    GFR calc non Af Amer 70 (*)    GFR calc Af Amer 82 (*)    All other components within normal limits  CBC WITH DIFFERENTIAL/PLATELET - Abnormal; Notable for the following:    Neutrophils Relative % 38 (*)    Lymphocytes Relative 56 (*)    All other components within normal limits  BRAIN NATRIURETIC PEPTIDE  I-STAT TROPOININ, ED    Imaging Review Dg Chest 2 View  03/27/2014   CLINICAL DATA:  Shortness of breath.  Chest pain.  EXAM: CHEST  2 VIEW  COMPARISON:  03/25/2014; 03/24/2014  FINDINGS: Stable mild cardiomegaly without edema. No pleural effusion. The lungs appear clear.  IMPRESSION: 1. Mild cardiomegaly. Otherwise, no significant abnormalities are observed.   Electronically Signed   By: Gaylyn RongWalter  Liebkemann M.D.   On: 03/27/2014 18:47     EKG Interpretation   Date/Time:  Wednesday March 27 2014 17:30:37 EST Ventricular Rate:  67 PR Interval:  113 QRS Duration: 89 QT Interval:  443 QTC Calculation: 468 R Axis:   48 Text Interpretation:  Sinus rhythm Borderline short PR interval No  significant  change since last tracing Confirmed by Mirian MoGentry, Matthew [phone]([id number])  on 03/27/2014 6:48:16 PM      MDM   Final diagnoses:  Influenza B  Cough  Myalgia    Pt is a 44yo female discharged yesterday after admission for respiratory distress on 3/7, dx with influenza type B.  Pt stated she requested discharge home yesterday as she was anxious to get home but did feel too weak to pick up Tamiflu, metformin, and prednisone at pharmacy yesterday. Pt presenting to ED with worsening SOB, and "feeling like shit"  Pt was placed on bipap during recent admission.  Pt's vitals were stable upon discharge yesterday. Pt discharged home with 5 day course of Tamilflu, prednisone taper, and metformin. Advised to f/u with PCP in 1 week. No indication pt signed out AMA. Pt states she wants to be readmitted and agrees to stay until she is officially cleared for discharge home. Pt appears fatigued on exam, faint expiratory wheeze on exam.    O2 Sat  down to 89% on RA. Duoneb, IV fluids ordered as pt states she has had decreased PO intake due to nausea, no vomiting or diarrhea.   Will repeat labs and CXR as last results were from 03/25/14.  Pt c/o centralized chest pain that is sharp in nature. BNP, troponin and EKG included in lab work ordered.  EKG: no significant change since last tracing. CXR: mild cardiomegaly, otherwise, no significant abnormalities observed.    Discussed pt with Dr. Littie Deeds who also examined pt.  Labs: unremarkable, pt has only 1 reading of O2 Sat of 89% on RA. Pt was given continuous albuterol neb tx.  Ambulated in halls, O2 Sat remained 100% on RA.  Pt stable for discharge home. Pt unable to get prednisone tonight,  prednisone given prior to discharge. Pt states she does feel comfortable with discharge home. Case management, Burna Mortimer, also spoke with pt about getting her prescriptions. Home care instructions provided.  Pt and family verbalized understanding and agreement with tx plan.    Junius Finner, PA-C 03/27/14 1610  Mirian Mo, MD 04/01/14 [phone]

## 2014-03-29 LAB — RESPIRATORY VIRUS PANEL
Adenovirus: NEGATIVE
INFLUENZA A: NEGATIVE
Influenza B: POSITIVE — AB
Metapneumovirus: NEGATIVE
PARAINFLUENZA 2 A: NEGATIVE
PARAINFLUENZA 3 A: NEGATIVE
Parainfluenza 1: NEGATIVE
Respiratory Syncytial Virus A: NEGATIVE
Respiratory Syncytial Virus B: NEGATIVE
Rhinovirus: NEGATIVE

## 2014-04-23 ENCOUNTER — Inpatient Hospital Stay: Payer: Medicaid Other | Admitting: Adult Health

## 2014-05-07 ENCOUNTER — Inpatient Hospital Stay: Payer: Medicaid Other | Admitting: Adult Health

## 2014-05-17 ENCOUNTER — Emergency Department (HOSPITAL_COMMUNITY)
Admission: EM | Admit: 2014-05-17 | Discharge: 2014-05-17 | Disposition: A | Discharge status: Home / Self Care | Payer: Medicaid Other | Attending: Emergency Medicine | Admitting: Emergency Medicine

## 2014-05-17 ENCOUNTER — Emergency Department (HOSPITAL_COMMUNITY): Payer: Medicaid Other

## 2014-05-17 ENCOUNTER — Encounter (HOSPITAL_COMMUNITY): Payer: Self-pay | Admitting: Emergency Medicine

## 2014-05-17 DIAGNOSIS — Z8669 Personal history of other diseases of the nervous system and sense organs: Secondary | ICD-10-CM | POA: Diagnosis not present

## 2014-05-17 DIAGNOSIS — J45901 Unspecified asthma with (acute) exacerbation: Secondary | ICD-10-CM | POA: Insufficient documentation

## 2014-05-17 DIAGNOSIS — Z880 Allergy status to penicillin: Secondary | ICD-10-CM | POA: Insufficient documentation

## 2014-05-17 DIAGNOSIS — R079 Chest pain, unspecified: Secondary | ICD-10-CM | POA: Diagnosis not present

## 2014-05-17 DIAGNOSIS — R609 Edema, unspecified: Secondary | ICD-10-CM | POA: Diagnosis not present

## 2014-05-17 DIAGNOSIS — Z79899 Other long term (current) drug therapy: Secondary | ICD-10-CM | POA: Insufficient documentation

## 2014-05-17 DIAGNOSIS — I10 Essential (primary) hypertension: Secondary | ICD-10-CM | POA: Diagnosis not present

## 2014-05-17 DIAGNOSIS — R0602 Shortness of breath: Secondary | ICD-10-CM

## 2014-05-17 DIAGNOSIS — E119 Type 2 diabetes mellitus without complications: Secondary | ICD-10-CM | POA: Diagnosis not present

## 2014-05-17 LAB — CBC
HCT: 37.4 % (ref 36.0–46.0)
Hemoglobin: 11.6 g/dL — ABNORMAL LOW (ref 12.0–15.0)
MCH: 27.9 pg (ref 26.0–34.0)
MCHC: 31 g/dL (ref 30.0–36.0)
MCV: 89.9 fL (ref 78.0–100.0)
Platelets: 255 10*3/uL (ref 150–400)
RBC: 4.16 MIL/uL (ref 3.87–5.11)
RDW: 16 % — ABNORMAL HIGH (ref 11.5–15.5)
WBC: 5.7 10*3/uL (ref 4.0–10.5)

## 2014-05-17 LAB — BASIC METABOLIC PANEL
Anion gap: 4 — ABNORMAL LOW (ref 5–15)
BUN: 12 mg/dL (ref 6–23)
CO2: 26 mmol/L (ref 19–32)
Calcium: 8.5 mg/dL (ref 8.4–10.5)
Chloride: 109 mmol/L (ref 96–112)
Creatinine, Ser: 0.96 mg/dL (ref 0.50–1.10)
GFR, EST AFRICAN AMERICAN: 81 mL/min — AB (ref >90)
GFR, EST NON AFRICAN AMERICAN: 70 mL/min — AB (ref >90)
GLUCOSE: 93 mg/dL (ref 70–99)
Potassium: 4 mmol/L (ref 3.5–5.1)
Sodium: 139 mmol/L (ref 135–145)

## 2014-05-17 LAB — I-STAT TROPONIN, ED: Troponin i, poc: 0 ng/mL (ref 0.00–0.08)

## 2014-05-17 LAB — BRAIN NATRIURETIC PEPTIDE: B NATRIURETIC PEPTIDE 5: 22.6 pg/mL (ref 0.0–100.0)

## 2014-05-17 MED ORDER — FUROSEMIDE 10 MG/ML IJ SOLN
40.0000 mg | Freq: Once | INTRAMUSCULAR | Status: AC
  Administered 2014-05-17: 40 mg via INTRAVENOUS
  Filled 2014-05-17: qty 4

## 2014-05-17 MED ORDER — METHYLPREDNISOLONE SODIUM SUCC 125 MG IJ SOLR
125.0000 mg | Freq: Once | INTRAMUSCULAR | Status: AC
  Administered 2014-05-17: 125 mg via INTRAVENOUS
  Filled 2014-05-17: qty 2

## 2014-05-17 MED ORDER — IPRATROPIUM-ALBUTEROL 0.5-2.5 (3) MG/3ML IN SOLN
3.0000 mL | RESPIRATORY_TRACT | Status: DC
  Administered 2014-05-17: 3 mL via RESPIRATORY_TRACT
  Filled 2014-05-17: qty 3

## 2014-05-17 MED ORDER — FUROSEMIDE 20 MG PO TABS: 20.0000 mg | ORAL_TABLET | Freq: Every day | ORAL | Status: DC

## 2014-05-17 MED ORDER — ALBUTEROL SULFATE HFA 108 (90 BASE) MCG/ACT IN AERS
2.0000 | INHALATION_SPRAY | Freq: Once | RESPIRATORY_TRACT | Status: AC
  Administered 2014-05-17: 2 via RESPIRATORY_TRACT
  Filled 2014-05-17: qty 6.7

## 2014-05-17 NOTE — Discharge Instructions (Signed)
Shortness of Breath Shortness of breath means you have trouble breathing. It could also mean that you have a medical problem. You should get immediate medical care for shortness of breath. CAUSES   Not enough oxygen in the air such as with high altitudes or a smoke-filled room.  Certain lung diseases, infections, or problems.  Heart disease or conditions, such as angina or heart failure.  Low red blood cells (anemia).  Poor physical fitness, which can cause shortness of breath when you exercise.  Chest or back injuries or stiffness.  Being overweight.  Smoking.  Anxiety, which can make you feel like you are not getting enough air. DIAGNOSIS  Serious medical problems can often be found during your physical exam. Tests may also be done to determine why you are having shortness of breath. Tests may include:  Chest X-rays.  Lung function tests.  Blood tests.  An electrocardiogram (ECG).  An ambulatory electrocardiogram. An ambulatory ECG records your heartbeat patterns over a 24-hour period.  Exercise testing.  A transthoracic echocardiogram (TTE). During echocardiography, sound waves are used to evaluate how blood flows through your heart.  A transesophageal echocardiogram (TEE).  Imaging scans. Your health care provider may not be able to find a cause for your shortness of breath after your exam. In this case, it is important to have a follow-up exam with your health care provider as directed.  TREATMENT  Treatment for shortness of breath depends on the cause of your symptoms and can vary greatly. HOME CARE INSTRUCTIONS   Do not smoke. Smoking is a common cause of shortness of breath. If you smoke, ask for help to quit.  Avoid being around chemicals or things that may bother your breathing, such as paint fumes and dust.  Rest as needed. Slowly resume your usual activities.  If medicines were prescribed, take them as directed for the full length of time directed. This  includes oxygen and any inhaled medicines.  Keep all follow-up appointments as directed by your health care provider. SEEK MEDICAL CARE IF:   Your condition does not improve in the time expected.  You have a hard time doing your normal activities even with rest.  You have any new symptoms. SEEK IMMEDIATE MEDICAL CARE IF:   Your shortness of breath gets worse.  You feel light-headed, faint, or develop a cough not controlled with medicines.  You start coughing up blood.  You have pain with breathing.  You have chest pain or pain in your arms, shoulders, or abdomen.  You have a fever.  You are unable to walk up stairs or exercise the way you normally do. MAKE SURE YOU:  Understand these instructions.  Will watch your condition.  Will get help right away if you are not doing well or get worse. Document Released: 09/29/2000 Document Revised: 01/09/2013 Document Reviewed: 03/22/2011 [facility] Patient Information 2015 [employer], [place]. This information is not intended to replace advice given to you by your health care provider. Make sure you discuss any questions you have with your health care provider.  Is important for you to take your medications as directed. Please follow-up with your cardiologist and/or primary care for further evaluation and management of your symptoms. Return to ED for new or worsening symptoms.

## 2014-05-17 NOTE — ED Provider Notes (Signed)
CSN: [id number]     Arrival date & time 05/17/14  0900 History   First MD Initiated Contact with Patient 05/17/14 0912     Chief Complaint  Patient presents with  . Shortness of Breath  . Chest Pain  . Leg Swelling     (Consider location/radiation/quality/duration/timing/severity/associated sxs/prior Treatment) HPI Melanie Hess is a 45 y.o. female with a history of asthma, hypertension, diabetes comes in for evaluation of shortness of breath. Patient states she is usually short of breath with exertion, but began to experience wheezing last night at 8 PM. She tried self medicating with albuterol treatments but it did not help. She also reports bilateral chest discomfort under her breasts when breathing in. She denies recent travel, surgeries, trauma, exogenous estrogen use, hemoptysis, history of blood clot. She also reports exacerbation of chronic right foot pain from plantar fascitis. No other modifying factors.  Past Medical History  Diagnosis Date  . Migraine   . Asthma   . Ankle fracture, right 2001  . Plantar fasciitis of right foot   . Hypertension   . OSA (obstructive sleep apnea)    Past Surgical History  Procedure Laterality Date  . Tubal ligation     Family History  Problem Relation Age of Onset  . Breast cancer Maternal Aunt   . Brain cancer Cousin   . Hypertension Mother    History  Substance Use Topics  . Smoking status: Never Smoker   . Smokeless tobacco: Never Used  . Alcohol Use: No   OB History    No data available     Review of Systems A 10 point review of systems was completed and was negative except for pertinent positives and negatives as mentioned in the history of present illness     Allergies  Aspirin; Nsaids; Meloxicam; Penicillins; Percocet; Tramadol; and Vicodin  Home Medications   Prior to Admission medications   Medication Sig Start Date End Date Taking? Authorizing Provider  albuterol (PROVENTIL HFA;VENTOLIN HFA) 108 (90  BASE) MCG/ACT inhaler Inhale 2 puffs into the lungs every 6 (six) hours as needed. Shortness of breath   Yes Historical Provider, MD  albuterol (PROVENTIL) (2.5 MG/3ML) 0.083% nebulizer solution Take 2.5 mg by nebulization every 6 (six) hours as needed for wheezing or shortness of breath.   Yes Historical Provider, MD  losartan (COZAAR) 25 MG tablet Take 1 tablet (25 mg total) by mouth daily. 02/14/14  Yes Kalman Shan, MD  metFORMIN (GLUCOPHAGE) 1000 MG tablet Take 1 tablet (1,000 mg total) by mouth 2 (two) times daily with a meal. 03/26/14  Yes Clydia Llano, MD  furosemide (LASIX) 20 MG tablet Take 1 tablet (20 mg total) by mouth daily. 05/17/14   Joycie Peek, PA-C  oseltamivir (TAMIFLU) 75 MG capsule Take 1 capsule (75 mg total) by mouth 2 (two) times daily. Patient not taking: Reported on 05/17/2014 03/26/14   Clydia Llano, MD  predniSONE (DELTASONE) 10 MG tablet Take 6-5-4-3-2-1 PO daily till gone Patient not taking: Reported on 05/17/2014 03/26/14   Clydia Llano, MD   BP 147/96 mmHg  Pulse 73  Temp(Src) 97.3 F (36.3 C) (Oral)  Resp 17  SpO2 95%  LMP 05/17/2014 Physical Exam  Constitutional: She is oriented to person, place, and time. She appears well-developed and well-nourished. No distress.  Morbidly obese  HENT:  Head: Normocephalic and atraumatic.  Mouth/Throat: Oropharynx is clear and moist.  Eyes: Conjunctivae are normal. Pupils are equal, round, and reactive to light. Right eye exhibits no  discharge. Left eye exhibits no discharge. No scleral icterus.  Neck: Neck supple.  Cardiovascular: Normal rate, regular rhythm and normal heart sounds.   Pulmonary/Chest: Effort normal and breath sounds normal. No respiratory distress. She has no wheezes. She has no rales.  Tenderness to palpation diffusely throughout chest more so on left side in parasternal region. No crepitus or bony step-offs.  Abdominal: Soft. There is no tenderness.  Musculoskeletal: She exhibits no tenderness.   Gross bilateral pedal edema, 2+.  Neurological: She is alert and oriented to person, place, and time.  Cranial Nerves II-XII grossly intact  Skin: Skin is warm and dry. No rash noted. She is not diaphoretic.  Psychiatric: She has a normal mood and affect.  Nursing note and vitals reviewed.   ED Course  Procedures (including critical care time) Labs Review Labs Reviewed  CBC - Abnormal; Notable for the following:    Hemoglobin 11.6 (*)    RDW 16.0 (*)    All other components within normal limits  BASIC METABOLIC PANEL - Abnormal; Notable for the following:    GFR calc non Af Amer 70 (*)    GFR calc Af Amer 81 (*)    Anion gap 4 (*)    All other components within normal limits  BRAIN NATRIURETIC PEPTIDE  I-STAT TROPOININ, ED    Imaging Review Dg Chest 2 View  05/17/2014   CLINICAL DATA:  Mid chest pain  EXAM: CHEST  2 VIEW  COMPARISON:  05/17/2014  FINDINGS: Subsegmental atelectasis along the left hemidiaphragm. Lungs appear otherwise clear. Cardiac and mediastinal margins appear normal. No pleural effusion.  IMPRESSION: 1. Subsegmental atelectasis in the left lower lobe. Otherwise, no significant abnormalities are observed.   Electronically Signed   By: Gaylyn Rong M.D.   On: 05/17/2014 10:38   Dg Chest Port 1 View  05/17/2014   CLINICAL DATA:  Shortness of breath, chest pain, lower extremity swelling, history of asthma  EXAM: PORTABLE CHEST - 1 VIEW  COMPARISON:  PA and lateral chest x-ray of April 06, 2014  FINDINGS: The lungs are adequately inflated. The cardiac silhouette is top-normal in size. The central pulmonary vascularity is mildly prominent. The pulmonary interstitial markings are slightly increased though a portion of this related to the portable technique. The trachea is midline. There is no pleural effusion or alveolar infiltrate. The bony thorax is unremarkable.  IMPRESSION: Mild pulmonary interstitial edema which may be of cardiac or noncardiac cause. A PA and  lateral chest x-ray would be useful for direct comparison with the previous study.   Electronically Signed   By: David  [place] M.D.   On: 05/17/2014 09:49     EKG Interpretation   Date/Time:  Friday May 17 2014 09:13:52 EDT Ventricular Rate:  66 PR Interval:  128 QRS Duration: 87 QT Interval:  439 QTC Calculation: 460 R Axis:   62 Text Interpretation:  Sinus rhythm Ventricular premature complex  Borderline repolarization abnormality Baseline wander in lead(s) II III  aVR aVF V1 V3 V4 V5 V6 Confirmed by COOK  MD, BRIAN ([id number]) on 05/17/2014  9:18:21 AM     Meds given in ED:  Medications  methylPREDNISolone sodium succinate (SOLU-MEDROL) 125 mg/2 mL injection 125 mg (125 mg Intravenous Given 05/17/14 0939)  furosemide (LASIX) injection 40 mg (40 mg Intravenous Given 05/17/14 1042)  albuterol (PROVENTIL HFA;VENTOLIN HFA) 108 (90 BASE) MCG/ACT inhaler 2 puff (2 puffs Inhalation Given 05/17/14 1245)    Discharge Medication List as of 05/17/2014 11:54 AM  START taking these medications   Details  furosemide (LASIX) 20 MG tablet Take 1 tablet (20 mg total) by mouth daily., Starting 05/17/2014, Until Discontinued, Print       Filed Vitals:   05/17/14 0913 05/17/14 1129 05/17/14 1248  BP: 160/99 151/89 147/96  Pulse: 71 70 73  Temp: 97.3 F (36.3 C)    TempSrc: Oral    Resp: 18 17   SpO2: 99% 99% 95%     MDM  Vitals stable - WNL -afebrile Pt resting comfortably in ED. PE--no dyspnea or tibia on exam. Oxygen saturation is 99% on room air. Normal lung exam. 2+ pedal edema Labwork--BNP 22.6. Labs otherwise noncontributory. EKG reassuring Imaging-chest x-ray shows subsegmental atelectasis in left lower lobe, otherwise no acute cardiopulmonary pathology  DDX--patient with shortness of breath likely secondary to asthma and body habitus. Reports she feels much better after DuoNeb and Solu-Medrol given in ED. Will DC with oral Lasix, albuterol inhaler and instructions to follow-up  with her cardiologist next week. No evidence of acute heart failure exacerbation at this time. Low concern for pulmonary embolism or other acute or emergent pathology at this time.  I discussed all relevant lab findings and imaging results with pt and they verbalized understanding. Discussed f/u with PCP within 48 hrs and return precautions, pt very amenable to plan. Stable for discharge and ambulates out of the ED without difficulty.  Prior to patient discharge, I discussed and reviewed this case with Dr. Adriana Simasook, who also saw and evaluated the patient.    Final diagnoses:  SOB (shortness of breath)       Joycie PeekBenjamin Shakeira Rhee, PA-C 05/17/14 1646  Joycie PeekBenjamin Josi Roediger, PA-C 05/17/14 1646  Donnetta HutchingBrian Cook, MD 05/18/14 [phone]

## 2014-05-17 NOTE — ED Notes (Signed)
Pt with Hx of asthma c/o SOB onset last night, states she self-treated with breathing treatments, no relief, left sided chest pain and tightness onset this morning, bilateral lower extremity edema onset 7 days ago.

## 2014-05-17 NOTE — ED Notes (Signed)
Pt states she did not want repeat nebulizer treatment. Alertx4 respirations easy non labored.

## 2014-06-03 ENCOUNTER — Emergency Department (HOSPITAL_COMMUNITY): Payer: Medicaid Other

## 2014-06-03 ENCOUNTER — Emergency Department (HOSPITAL_COMMUNITY)
Admission: EM | Admit: 2014-06-03 | Discharge: 2014-06-04 | Disposition: A | Discharge status: Home / Self Care | Payer: Medicaid Other | Attending: Emergency Medicine | Admitting: Emergency Medicine

## 2014-06-03 ENCOUNTER — Encounter (HOSPITAL_COMMUNITY): Payer: Self-pay

## 2014-06-03 DIAGNOSIS — R0602 Shortness of breath: Secondary | ICD-10-CM | POA: Diagnosis present

## 2014-06-03 DIAGNOSIS — I10 Essential (primary) hypertension: Secondary | ICD-10-CM | POA: Diagnosis not present

## 2014-06-03 DIAGNOSIS — Z7952 Long term (current) use of systemic steroids: Secondary | ICD-10-CM | POA: Diagnosis not present

## 2014-06-03 DIAGNOSIS — R0789 Other chest pain: Secondary | ICD-10-CM | POA: Insufficient documentation

## 2014-06-03 DIAGNOSIS — Z880 Allergy status to penicillin: Secondary | ICD-10-CM | POA: Insufficient documentation

## 2014-06-03 DIAGNOSIS — R0600 Dyspnea, unspecified: Secondary | ICD-10-CM

## 2014-06-03 DIAGNOSIS — J45901 Unspecified asthma with (acute) exacerbation: Secondary | ICD-10-CM | POA: Insufficient documentation

## 2014-06-03 DIAGNOSIS — Z8781 Personal history of (healed) traumatic fracture: Secondary | ICD-10-CM | POA: Insufficient documentation

## 2014-06-03 DIAGNOSIS — Z8739 Personal history of other diseases of the musculoskeletal system and connective tissue: Secondary | ICD-10-CM | POA: Insufficient documentation

## 2014-06-03 DIAGNOSIS — Z8669 Personal history of other diseases of the nervous system and sense organs: Secondary | ICD-10-CM | POA: Insufficient documentation

## 2014-06-03 DIAGNOSIS — Z79899 Other long term (current) drug therapy: Secondary | ICD-10-CM | POA: Insufficient documentation

## 2014-06-03 LAB — BASIC METABOLIC PANEL
Anion gap: 6 (ref 5–15)
BUN: 16 mg/dL (ref 6–20)
CALCIUM: 8.7 mg/dL — AB (ref 8.9–10.3)
CO2: 24 mmol/L (ref 22–32)
Chloride: 109 mmol/L (ref 101–111)
Creatinine, Ser: 0.96 mg/dL (ref 0.44–1.00)
GLUCOSE: 135 mg/dL — AB (ref 65–99)
Potassium: 3.6 mmol/L (ref 3.5–5.1)
Sodium: 139 mmol/L (ref 135–145)

## 2014-06-03 LAB — I-STAT TROPONIN, ED: TROPONIN I, POC: 0 ng/mL (ref 0.00–0.08)

## 2014-06-03 LAB — CBC
HCT: 35.9 % — ABNORMAL LOW (ref 36.0–46.0)
Hemoglobin: 11.6 g/dL — ABNORMAL LOW (ref 12.0–15.0)
MCH: 28.3 pg (ref 26.0–34.0)
MCHC: 32.3 g/dL (ref 30.0–36.0)
MCV: 87.6 fL (ref 78.0–100.0)
Platelets: 279 10*3/uL (ref 150–400)
RBC: 4.1 MIL/uL (ref 3.87–5.11)
RDW: 15.5 % (ref 11.5–15.5)
WBC: 7.5 10*3/uL (ref 4.0–10.5)

## 2014-06-03 MED ORDER — IPRATROPIUM-ALBUTEROL 0.5-2.5 (3) MG/3ML IN SOLN
3.0000 mL | Freq: Once | RESPIRATORY_TRACT | Status: AC
  Administered 2014-06-03: 3 mL via RESPIRATORY_TRACT
  Filled 2014-06-03: qty 3

## 2014-06-03 NOTE — ED Notes (Signed)
Pt presents with c/o chest pain and shortness of breath. Pt reports very sharp pains in her chest when she breathes in and out. Pt reports a hx of asthma. Pt also c/o itching and burning to her right hand.

## 2014-06-04 MED ORDER — ACETAMINOPHEN 325 MG PO TABS
650.0000 mg | ORAL_TABLET | Freq: Once | ORAL | Status: AC
  Administered 2014-06-04: 650 mg via ORAL
  Filled 2014-06-04: qty 2

## 2014-06-04 MED ORDER — METHOCARBAMOL 500 MG PO TABS: 500.0000 mg | ORAL_TABLET | Freq: Three times a day (TID) | ORAL | Status: DC | PRN

## 2014-06-04 MED ORDER — METHOCARBAMOL 500 MG PO TABS
500.0000 mg | ORAL_TABLET | Freq: Once | ORAL | Status: AC
  Administered 2014-06-04: 500 mg via ORAL
  Filled 2014-06-04: qty 1

## 2014-06-04 NOTE — ED Provider Notes (Signed)
CSN: [id number]     Arrival date & time 06/03/14  2149 History   First MD Initiated Contact with Patient 06/04/14 0144     Chief Complaint  Patient presents with  . Chest Pain  . Shortness of Breath     (Consider location/radiation/quality/duration/timing/severity/associated sxs/prior Treatment) HPI 44 year old female presents to the emergency part with complaint of chest pain and shortness of breath.  Symptoms started earlier today.  She reports that she had an asthma attack that did not respond to her home medications.  With that she had worsening cough which triggered her pain.  Pain is up to the center and around the sides.  She has had similar pain in the past.  Patient reports that she has been told that she has congestive heart failure.  She reports that she estimates that she has asthma attacks at least once a week.  She has never seen a pulmonologist that she can remember.  Patient reports that she has history of sleep apnea, but has not yet had the sleep apnea study done yet.  No fevers or chills.  Pain is sharp, worse with deep breathing and with palpation of the area.  Patient seen in the emergency department frequently for similar complaints. Past Medical History  Diagnosis Date  . Migraine   . Asthma   . Ankle fracture, right 2001  . Plantar fasciitis of right foot   . Hypertension   . OSA (obstructive sleep apnea)    Past Surgical History  Procedure Laterality Date  . Tubal ligation     Family History  Problem Relation Age of Onset  . Breast cancer Maternal Aunt   . Brain cancer Cousin   . Hypertension Mother    History  Substance Use Topics  . Smoking status: Never Smoker   . Smokeless tobacco: Never Used  . Alcohol Use: No   OB History    No data available     Review of Systems   See History of Present Illness; otherwise all other systems are reviewed and negative  Allergies  Aspirin; Nsaids; Meloxicam; Penicillins; Percocet; Tramadol; and  Vicodin  Home Medications   Prior to Admission medications   Medication Sig Start Date End Date Taking? Authorizing Provider  albuterol (PROVENTIL HFA;VENTOLIN HFA) 108 (90 BASE) MCG/ACT inhaler Inhale 2 puffs into the lungs every 6 (six) hours as needed. Shortness of breath   Yes Historical Provider, MD  albuterol (PROVENTIL) (2.5 MG/3ML) 0.083% nebulizer solution Take 2.5 mg by nebulization every 2 (two) hours as needed for wheezing or shortness of breath.    Yes Historical Provider, MD  losartan (COZAAR) 25 MG tablet Take 1 tablet (25 mg total) by mouth daily. 02/14/14  Yes Kalman Shan, MD  metFORMIN (GLUCOPHAGE) 1000 MG tablet Take 1 tablet (1,000 mg total) by mouth 2 (two) times daily with a meal. 03/26/14  Yes Clydia Llano, MD  furosemide (LASIX) 20 MG tablet Take 1 tablet (20 mg total) by mouth daily. 05/17/14   Joycie Peek, PA-C  methocarbamol (ROBAXIN) 500 MG tablet Take 1 tablet (500 mg total) by mouth every 8 (eight) hours as needed for muscle spasms. 06/04/14   Marisa Severin, MD  oseltamivir (TAMIFLU) 75 MG capsule Take 1 capsule (75 mg total) by mouth 2 (two) times daily. Patient not taking: Reported on 05/17/2014 03/26/14   Clydia Llano, MD  predniSONE (DELTASONE) 10 MG tablet Take 6-5-4-3-2-1 PO daily till gone Patient not taking: Reported on 05/17/2014 03/26/14   Clydia Llano, MD  BP 157/77 mmHg  Pulse 61  Temp(Src) 97.6 F (36.4 C) (Oral)  Resp 15  Ht 5\' 6"  (1.676 m)  Wt 367 lb (166.47 kg)  BMI 59.26 kg/m2  SpO2 100%  LMP 05/17/2014 Physical Exam  Constitutional: She is oriented to person, place, and time. She appears well-developed and well-nourished. She appears distressed.  Morbidly obese female, uncomfortable appearing.  She is speaking in full sentences, no audible wheezing  HENT:  Head: Normocephalic and atraumatic.  Nose: Nose normal.  Mouth/Throat: Oropharynx is clear and moist.  Eyes: Conjunctivae and EOM are normal. Pupils are equal, round, and reactive to  light.  Neck: Normal range of motion. Neck supple. No JVD present. No tracheal deviation present. No thyromegaly present.  Cardiovascular: Normal rate, regular rhythm, normal heart sounds and intact distal pulses.  Exam reveals no gallop and no friction rub.   No murmur heard. Pulmonary/Chest: Effort normal and breath sounds normal. No stridor. No respiratory distress. She has no wheezes. She has no rales. She exhibits tenderness (Ration is tenderness on both sides of the sternum as well as the middle ribs under the breasts bilaterally).  Abdominal: Soft. Bowel sounds are normal. She exhibits no distension and no mass. There is no tenderness. There is no rebound and no guarding.  Musculoskeletal: Normal range of motion. She exhibits no edema or tenderness.  Lymphadenopathy:    She has no cervical adenopathy.  Neurological: She is alert and oriented to person, place, and time. She displays normal reflexes. She exhibits normal muscle tone. Coordination normal.  Skin: Skin is warm and dry. No rash noted. No erythema. No pallor.  Psychiatric: She has a normal mood and affect. Her behavior is normal. Judgment and thought content normal.  Nursing note and vitals reviewed.   ED Course  Procedures (including critical care time) Labs Review Labs Reviewed  CBC - Abnormal; Notable for the following:    Hemoglobin 11.6 (*)    HCT 35.9 (*)    All other components within normal limits  BASIC METABOLIC PANEL - Abnormal; Notable for the following:    Glucose, Bld 135 (*)    Calcium 8.7 (*)    All other components within normal limits  I-STAT TROPOININ, ED    Imaging Review Dg Chest 2 View (if Patient Has Fever And/or Copd)  06/03/2014   CLINICAL DATA:  44 year old female with chest pain and shortness of breath  EXAM: CHEST  2 VIEW  COMPARISON:  Prior chest x-ray 05/17/2014  FINDINGS: The lungs are clear and negative for focal airspace consolidation, pulmonary edema or suspicious pulmonary nodule.  Stable central bronchitic change. No pleural effusion or pneumothorax. Cardiac and mediastinal contours are within normal limits. No acute fracture or lytic or blastic osseous lesions. The visualized upper abdominal bowel gas pattern is unremarkable.  IMPRESSION: No active cardiopulmonary disease.   Electronically Signed   By: Malachy MoanHeath  McCullough M.D.   On: 06/03/2014 22:33     EKG Interpretation   Date/Time:  Monday Jun 03 2014 21:57:55 EDT Ventricular Rate:  76 PR Interval:  150 QRS Duration: 89 QT Interval:  437 QTC Calculation: 491 R Axis:   62 Text Interpretation:  Sinus rhythm Borderline prolonged QT interval since  last tracing no significant change Confirmed by MILLER  MD, BRIAN ([id number])  on 06/03/2014 11:12:23 PM      MDM   Final diagnoses:  Dyspnea  Chest wall pain    44 year old female with bilateral chest wall pain, shortness of breath.  No  wheezing noted on exam, no CHF, no ischemia.  Patient with acute on chronic issues.  She has not yet followed up for sleep study.  Have advised her that losing weight may help with her ongoing dyspnea.  Patient is stable for discharge home.    Marisa Severinlga Lidya Mccalister, MD 06/04/14 [phone]

## 2014-06-04 NOTE — Discharge Instructions (Signed)
Your workup today has not shown a specific cause for your symptoms.  Is important for you to follow-up with the sleep study that has been ordered for you, your primary care doctor and/or pulmonology for better management of your ongoing shortness of breath and asthma symptoms.  Your pain appears to be coming from your chest wall, not from your heart or your lungs.  Warm compresses, heating pad, hot water bottle may help with symptoms.  Take Robaxin as prescribed.  Return to the emergency room for worsening condition or new concerning symptoms.   Chest Wall Pain Chest wall pain is pain in or around the bones and muscles of your chest. It may take up to 6 weeks to get better. It may take longer if you must stay physically active in your work and activities.  CAUSES  Chest wall pain may happen on its own. However, it may be caused by:  A viral illness like the flu.  Injury.  Coughing.  Exercise.  Arthritis.  Fibromyalgia.  Shingles. HOME CARE INSTRUCTIONS   Avoid overtiring physical activity. Try not to strain or perform activities that cause pain. This includes any activities using your chest or your abdominal and side muscles, especially if heavy weights are used.  Put ice on the sore area.  Put ice in a plastic bag.  Place a towel between your skin and the bag.  Leave the ice on for 15-20 minutes per hour while awake for the first 2 days.  Only take over-the-counter or prescription medicines for pain, discomfort, or fever as directed by your caregiver. SEEK IMMEDIATE MEDICAL CARE IF:   Your pain increases, or you are very uncomfortable.  You have a fever.  Your chest pain becomes worse.  You have new, unexplained symptoms.  You have nausea or vomiting.  You feel sweaty or lightheaded.  You have a cough with phlegm (sputum), or you cough up blood. MAKE SURE YOU:   Understand these instructions.  Will watch your condition.  Will get help right away if you are not  doing well or get worse. Document Released: 01/04/2005 Document Revised: 03/29/2011 Document Reviewed: 08/31/2010 [facility] Patient Information 2015 [employer], [place]. This information is not intended to replace advice given to you by your health care provider. Make sure you discuss any questions you have with your health care provider.  Shortness of Breath Shortness of breath means you have trouble breathing. It could also mean that you have a medical problem. You should get immediate medical care for shortness of breath. CAUSES   Not enough oxygen in the air such as with high altitudes or a smoke-filled room.  Certain lung diseases, infections, or problems.  Heart disease or conditions, such as angina or heart failure.  Low red blood cells (anemia).  Poor physical fitness, which can cause shortness of breath when you exercise.  Chest or back injuries or stiffness.  Being overweight.  Smoking.  Anxiety, which can make you feel like you are not getting enough air. DIAGNOSIS  Serious medical problems can often be found during your physical exam. Tests may also be done to determine why you are having shortness of breath. Tests may include:  Chest X-rays.  Lung function tests.  Blood tests.  An electrocardiogram (ECG).  An ambulatory electrocardiogram. An ambulatory ECG records your heartbeat patterns over a 24-hour period.  Exercise testing.  A transthoracic echocardiogram (TTE). During echocardiography, sound waves are used to evaluate how blood flows through your heart.  A transesophageal echocardiogram (  TEE).  Imaging scans. Your health care provider may not be able to find a cause for your shortness of breath after your exam. In this case, it is important to have a follow-up exam with your health care provider as directed.  TREATMENT  Treatment for shortness of breath depends on the cause of your symptoms and can vary greatly. HOME CARE INSTRUCTIONS   Do not smoke.  Smoking is a common cause of shortness of breath. If you smoke, ask for help to quit.  Avoid being around chemicals or things that may bother your breathing, such as paint fumes and dust.  Rest as needed. Slowly resume your usual activities.  If medicines were prescribed, take them as directed for the full length of time directed. This includes oxygen and any inhaled medicines.  Keep all follow-up appointments as directed by your health care provider. SEEK MEDICAL CARE IF:   Your condition does not improve in the time expected.  You have a hard time doing your normal activities even with rest.  You have any new symptoms. SEEK IMMEDIATE MEDICAL CARE IF:   Your shortness of breath gets worse.  You feel light-headed, faint, or develop a cough not controlled with medicines.  You start coughing up blood.  You have pain with breathing.  You have chest pain or pain in your arms, shoulders, or abdomen.  You have a fever.  You are unable to walk up stairs or exercise the way you normally do. MAKE SURE YOU:  Understand these instructions.  Will watch your condition.  Will get help right away if you are not doing well or get worse. Document Released: 09/29/2000 Document Revised: 01/09/2013 Document Reviewed: 03/22/2011 [facility] Patient Information 2015 [place], [place]. This information is not intended to replace advice given to you by your health care provider. Make sure you discuss any questions you have with your health care provider.

## 2014-10-18 ENCOUNTER — Emergency Department (HOSPITAL_COMMUNITY)
Admission: EM | Admit: 2014-10-18 | Discharge: 2014-10-18 | Disposition: A | Discharge status: Home / Self Care | Payer: Medicaid Other | Attending: Emergency Medicine | Admitting: Emergency Medicine

## 2014-10-18 ENCOUNTER — Encounter (HOSPITAL_COMMUNITY): Payer: Self-pay | Admitting: General Practice

## 2014-10-18 ENCOUNTER — Emergency Department (HOSPITAL_COMMUNITY): Payer: Medicaid Other

## 2014-10-18 DIAGNOSIS — Z8781 Personal history of (healed) traumatic fracture: Secondary | ICD-10-CM | POA: Diagnosis not present

## 2014-10-18 DIAGNOSIS — Z8669 Personal history of other diseases of the nervous system and sense organs: Secondary | ICD-10-CM | POA: Diagnosis not present

## 2014-10-18 DIAGNOSIS — Z8679 Personal history of other diseases of the circulatory system: Secondary | ICD-10-CM | POA: Insufficient documentation

## 2014-10-18 DIAGNOSIS — J45901 Unspecified asthma with (acute) exacerbation: Secondary | ICD-10-CM | POA: Diagnosis not present

## 2014-10-18 DIAGNOSIS — R1013 Epigastric pain: Secondary | ICD-10-CM | POA: Insufficient documentation

## 2014-10-18 DIAGNOSIS — M722 Plantar fascial fibromatosis: Secondary | ICD-10-CM | POA: Diagnosis not present

## 2014-10-18 DIAGNOSIS — R0602 Shortness of breath: Secondary | ICD-10-CM

## 2014-10-18 DIAGNOSIS — M25572 Pain in left ankle and joints of left foot: Secondary | ICD-10-CM | POA: Diagnosis present

## 2014-10-18 DIAGNOSIS — Z880 Allergy status to penicillin: Secondary | ICD-10-CM | POA: Diagnosis not present

## 2014-10-18 DIAGNOSIS — Z79899 Other long term (current) drug therapy: Secondary | ICD-10-CM | POA: Diagnosis not present

## 2014-10-18 DIAGNOSIS — I10 Essential (primary) hypertension: Secondary | ICD-10-CM | POA: Diagnosis not present

## 2014-10-18 LAB — COMPREHENSIVE METABOLIC PANEL
ALK PHOS: 68 U/L (ref 38–126)
ALT: 19 U/L (ref 14–54)
AST: 18 U/L (ref 15–41)
Albumin: 3.5 g/dL (ref 3.5–5.0)
Anion gap: 7 (ref 5–15)
BILIRUBIN TOTAL: 0.6 mg/dL (ref 0.3–1.2)
BUN: 9 mg/dL (ref 6–20)
CALCIUM: 9.3 mg/dL (ref 8.9–10.3)
CO2: 25 mmol/L (ref 22–32)
CREATININE: 0.99 mg/dL (ref 0.44–1.00)
Chloride: 109 mmol/L (ref 101–111)
GFR calc non Af Amer: >60 mL/min (ref >60)
GLUCOSE: 88 mg/dL (ref 65–99)
Potassium: 4.1 mmol/L (ref 3.5–5.1)
SODIUM: 141 mmol/L (ref 135–145)
TOTAL PROTEIN: 6.5 g/dL (ref 6.5–8.1)

## 2014-10-18 LAB — CBC
HCT: 34.8 % — ABNORMAL LOW (ref 36.0–46.0)
Hemoglobin: 11.1 g/dL — ABNORMAL LOW (ref 12.0–15.0)
MCH: 27.8 pg (ref 26.0–34.0)
MCHC: 31.9 g/dL (ref 30.0–36.0)
MCV: 87 fL (ref 78.0–100.0)
PLATELETS: 232 10*3/uL (ref 150–400)
RBC: 4 MIL/uL (ref 3.87–5.11)
RDW: 15.8 % — ABNORMAL HIGH (ref 11.5–15.5)
WBC: 5 10*3/uL (ref 4.0–10.5)

## 2014-10-18 LAB — BRAIN NATRIURETIC PEPTIDE: B Natriuretic Peptide: 26.6 pg/mL (ref 0.0–100.0)

## 2014-10-18 LAB — I-STAT TROPONIN, ED: TROPONIN I, POC: 0 ng/mL (ref 0.00–0.08)

## 2014-10-18 MED ORDER — OMEPRAZOLE 20 MG PO CPDR: 20.0000 mg | DELAYED_RELEASE_CAPSULE | Freq: Every day | ORAL | Status: DC

## 2014-10-18 NOTE — ED Notes (Signed)
Pt transported to xray 

## 2014-10-18 NOTE — ED Provider Notes (Signed)
CSN: [id number]     Arrival date & time 10/18/14  1047 History   First MD Initiated Contact with Patient 10/18/14 1050     Chief Complaint  Patient presents with  . Ankle Pain  . Shortness of Breath  . Chest Pain     (Consider location/radiation/quality/duration/timing/severity/associated sxs/prior Treatment) HPI Comments: Patient with a history of Asthma, HTN, and OSA presents today with complaints of SOB and chest pain  She reports that symptoms have been present since yesterday.  She describes the chest pain as both a tightness and also states that it feels sore.  Pain located in the substernal and epigastric area and radiates to the sides below both the right and left anterior ribs.  She states that pain is associated with some SOB.  She did use her breathing treatment this morning, which did help with the pain and SOB.  She denies fever, chills, cough, or hemoptysis. She denies history of DVT or PE.  Denies prolonged travel or surgeries in the past 4 weeks.  Denies exogenous estrogen use.  She reports a history of CHF, but denies any other cardiac history.    Patient also complaining of pain of the left foot in the area of the heel.  She reports that she has a history of Plantar Fascitis of the right foot and that the pain in her left foot feels similar.  Pain worse with walking.  She states that the pain has been present for several months.  She denies any calf pain.  Denies history of DVT or PE.  Denies prolonged travel or surgeries in the past 4 weeks.  Patient is a 44 y.o. female presenting with ankle pain, shortness of breath, and chest pain. The history is provided by the patient.  Ankle Pain Shortness of Breath Associated symptoms: chest pain   Chest Pain Associated symptoms: shortness of breath     Past Medical History  Diagnosis Date  . Migraine   . Asthma   . Ankle fracture, right 2001  . Plantar fasciitis of right foot   . Hypertension   . OSA (obstructive sleep apnea)     Past Surgical History  Procedure Laterality Date  . Tubal ligation     Family History  Problem Relation Age of Onset  . Breast cancer Maternal Aunt   . Brain cancer Cousin   . Hypertension Mother    Social History  Substance Use Topics  . Smoking status: Never Smoker   . Smokeless tobacco: Never Used  . Alcohol Use: No   OB History    No data available     Review of Systems  Respiratory: Positive for shortness of breath.   Cardiovascular: Positive for chest pain.  All other systems reviewed and are negative.     Allergies  Aspirin; Nsaids; Meloxicam; Penicillins; Percocet; Tramadol; and Vicodin  Home Medications   Prior to Admission medications   Medication Sig Start Date End Date Taking? Authorizing Provider  albuterol (PROVENTIL HFA;VENTOLIN HFA) 108 (90 BASE) MCG/ACT inhaler Inhale 2 puffs into the lungs every 6 (six) hours as needed. Shortness of breath    Historical Provider, MD  albuterol (PROVENTIL) (2.5 MG/3ML) 0.083% nebulizer solution Take 2.5 mg by nebulization every 2 (two) hours as needed for wheezing or shortness of breath.     Historical Provider, MD  furosemide (LASIX) 20 MG tablet Take 1 tablet (20 mg total) by mouth daily. 05/17/14   Joycie Peek, PA-C  losartan (COZAAR) 25 MG tablet Take 1  tablet (25 mg total) by mouth daily. 02/14/14   Kalman Shan, MD  metFORMIN (GLUCOPHAGE) 1000 MG tablet Take 1 tablet (1,000 mg total) by mouth 2 (two) times daily with a meal. 03/26/14   Clydia Llano, MD  methocarbamol (ROBAXIN) 500 MG tablet Take 1 tablet (500 mg total) by mouth every 8 (eight) hours as needed for muscle spasms. 06/04/14   Marisa Severin, MD  oseltamivir (TAMIFLU) 75 MG capsule Take 1 capsule (75 mg total) by mouth 2 (two) times daily. Patient not taking: Reported on 05/17/2014 03/26/14   Clydia Llano, MD  predniSONE (DELTASONE) 10 MG tablet Take 6-5-4-3-2-1 PO daily till gone Patient not taking: Reported on 05/17/2014 03/26/14   Clydia Llano, MD   BP  172/80 mmHg  Pulse 63  Temp(Src) 98 F (36.7 C) (Oral)  Resp 15  Ht  (1.676 m)  SpO2 100%  LMP 10/09/2014 Physical Exam  Constitutional: She appears well-developed and well-nourished. No distress.  Morbidly obese  HENT:  Head: Normocephalic and atraumatic.  Mouth/Throat: Oropharynx is clear and moist.  Neck: Normal range of motion. Neck supple.  Cardiovascular: Normal rate, regular rhythm and normal heart sounds.   Pulmonary/Chest: Effort normal and breath sounds normal. No respiratory distress. She has no wheezes. She has no rales. She exhibits tenderness.  Abdominal: Soft. Bowel sounds are normal. She exhibits no distension and no mass. There is tenderness in the epigastric area. There is no rebound and no guarding.  Musculoskeletal: Normal range of motion.  Tenderness to palpation of the plantar fascia worse at the plantar aspect of the heel.  No erythema, edema, or warmth of the foot, ankle, or calf.  Neurological: She is alert.  Skin: Skin is warm and dry. She is not diaphoretic.  Psychiatric: She has a normal mood and affect.  Nursing note and vitals reviewed.   ED Course  Procedures (including critical care time) Labs Review Labs Reviewed  CBC - Abnormal; Notable for the following:    Hemoglobin 11.1 (*)    HCT 34.8 (*)    RDW 15.8 (*)    All other components within normal limits  BRAIN NATRIURETIC PEPTIDE  COMPREHENSIVE METABOLIC PANEL  Rosezena Sensor, ED    Imaging Review Dg Chest 2 View  10/18/2014   CLINICAL DATA:  Chest pain.  EXAM: CHEST  2 VIEW  COMPARISON:  06/03/2014 chest radiograph.  FINDINGS: Stable cardiomediastinal silhouette with normal heart size. There is stable mild fullness of the right hilum, which is unchanged since at least 12/23/2011 and was seen to represent a normal pulmonary vascular shadow on the 03/24/2014 chest CT. No pneumothorax. No pleural effusion. Clear lungs, with no focal lung consolidation and no pulmonary edema.   IMPRESSION: No active disease in the chest.   Electronically Signed   By: Delbert Phenix M.D.   On: 10/18/2014 12:30   I have personally reviewed and evaluated these images and lab results as part of my medical decision-making.   EKG Interpretation   Date/Time:  Friday October 18 2014 10:59:45 EDT Ventricular Rate:  60 PR Interval:  143 QRS Duration: 90 QT Interval:  464 QTC Calculation: 464 R Axis:   66 Text Interpretation:  Sinus rhythm Confirmed by ZACKOWSKI  MD, SCOTT  ([id number]) on 10/18/2014 11:56:25 AM      MDM   Final diagnoses:  None   Patient presents today with a chief complaint of SOB and tightness of her chest that has been present since yesterday.  She reports improvement of  symptoms after using her breathing treatment.  Lungs CTAB on exam today.  She has several ED visits for similar presentation.  Feel that body habitus is contributing to her symptoms.  Labs today unremarkable.  No ischemic changes on EKG.  Troponin negative.  CXR is also negative.  She is also PERC negative.  Chest wall tender to palpation.  She has been given Robaxin for her chest pain in the past and states that it has helped in the past.  She reports that she has the medication at home.  Feel that the patient is stable for discharge.  Instructed to follow up with PCP.  Return precautions given.      Santiago Glad, PA-C 10/20/14 1056  Vanetta Mulders, MD 10/21/14 [phone]

## 2014-10-18 NOTE — ED Notes (Addendum)
Pt presents with a chief complaint of left ankle pain that is chronic in nature. Pt goes to an orthopedic doctor for her foot pain. Pt also reporting some shortness of breath, and some chest tightness rating pain a 5/10. Pt reporting back pain as well. Pt denies any N/V.

## 2015-02-07 ENCOUNTER — Encounter (HOSPITAL_COMMUNITY): Payer: Self-pay

## 2015-02-07 ENCOUNTER — Emergency Department (HOSPITAL_COMMUNITY): Payer: Medicaid Other

## 2015-02-07 ENCOUNTER — Emergency Department (HOSPITAL_COMMUNITY)
Admission: EM | Admit: 2015-02-07 | Discharge: 2015-02-07 | Disposition: A | Discharge status: Home / Self Care | Payer: Medicaid Other | Attending: Emergency Medicine | Admitting: Emergency Medicine

## 2015-02-07 DIAGNOSIS — S3991XA Unspecified injury of abdomen, initial encounter: Secondary | ICD-10-CM | POA: Insufficient documentation

## 2015-02-07 DIAGNOSIS — M1990 Unspecified osteoarthritis, unspecified site: Secondary | ICD-10-CM | POA: Diagnosis not present

## 2015-02-07 DIAGNOSIS — S99921A Unspecified injury of right foot, initial encounter: Secondary | ICD-10-CM | POA: Diagnosis present

## 2015-02-07 DIAGNOSIS — Z8781 Personal history of (healed) traumatic fracture: Secondary | ICD-10-CM | POA: Diagnosis not present

## 2015-02-07 DIAGNOSIS — D849 Immunodeficiency, unspecified: Secondary | ICD-10-CM | POA: Insufficient documentation

## 2015-02-07 DIAGNOSIS — Y9389 Activity, other specified: Secondary | ICD-10-CM | POA: Diagnosis not present

## 2015-02-07 DIAGNOSIS — S99911A Unspecified injury of right ankle, initial encounter: Secondary | ICD-10-CM | POA: Diagnosis not present

## 2015-02-07 DIAGNOSIS — Z79899 Other long term (current) drug therapy: Secondary | ICD-10-CM | POA: Diagnosis not present

## 2015-02-07 DIAGNOSIS — Y998 Other external cause status: Secondary | ICD-10-CM | POA: Diagnosis not present

## 2015-02-07 DIAGNOSIS — G8929 Other chronic pain: Secondary | ICD-10-CM | POA: Diagnosis not present

## 2015-02-07 DIAGNOSIS — Y9289 Other specified places as the place of occurrence of the external cause: Secondary | ICD-10-CM | POA: Diagnosis not present

## 2015-02-07 DIAGNOSIS — Z880 Allergy status to penicillin: Secondary | ICD-10-CM | POA: Insufficient documentation

## 2015-02-07 DIAGNOSIS — I10 Essential (primary) hypertension: Secondary | ICD-10-CM | POA: Diagnosis not present

## 2015-02-07 DIAGNOSIS — W19XXXA Unspecified fall, initial encounter: Secondary | ICD-10-CM

## 2015-02-07 DIAGNOSIS — Z7984 Long term (current) use of oral hypoglycemic drugs: Secondary | ICD-10-CM | POA: Insufficient documentation

## 2015-02-07 DIAGNOSIS — J45909 Unspecified asthma, uncomplicated: Secondary | ICD-10-CM | POA: Insufficient documentation

## 2015-02-07 HISTORY — DX: Unspecified osteoarthritis, unspecified site: M19.90

## 2015-02-07 MED ORDER — ACETAMINOPHEN 500 MG PO TABS
1000.0000 mg | ORAL_TABLET | Freq: Once | ORAL | Status: AC
  Administered 2015-02-07: 1000 mg via ORAL
  Filled 2015-02-07: qty 2

## 2015-02-07 NOTE — ED Notes (Addendum)
Pt c/o bilateral ankle pain and R groin pain after a fall last night.  Pain score 10/10.  Pt has not taken anything for pain.  Pt reports "my feet gave out and I fell."  Hx of arthritis and plantar fasciitis.  Pt reports that she is supposed to wear "a cast boot on the R foot and a regular boot on the L foot".  Pt noted to be wearing slippers.  Sts "I'm supposed to have surgery on my ankles, but I just haven't had it done."

## 2015-02-07 NOTE — ED Notes (Signed)
Patient transported to X-ray 

## 2015-02-07 NOTE — ED Provider Notes (Signed)
CSN: [id number]     Arrival date & time 02/07/15  0827 History   First MD Initiated Contact with Patient 02/07/15 [phone]     Chief Complaint  Patient presents with  . Fall  . Ankle Pain  . Hip Pain     (Consider location/radiation/quality/duration/timing/severity/associated sxs/prior Treatment) HPI Patient fell last night when she was getting out of a car. She complains of right foot pain right ankle pain and pain in her right groin since the fall. She has chronic right foot pain and right ankle pain. She has been ambulatory since the event. No other injury. No treatment prior to coming here. Pain moderate at present. She reports that I wear supportive boots on both legs, however the boot for my left leg has been "ripped up" for several months. She does have a boot for her left leg which is at home presently. Pain is worse with weightbearing improved with nonweightbearing. No other associated symptoms. Denies injury to left lower extremity Past Medical History  Diagnosis Date  . Migraine   . Asthma   . Ankle fracture, right 2001  . Plantar fasciitis of right foot   . Hypertension   . OSA (obstructive sleep apnea)   . Arthritis    Past Surgical History  Procedure Laterality Date  . Tubal ligation     Family History  Problem Relation Age of Onset  . Breast cancer Maternal Aunt   . Brain cancer Cousin   . Hypertension Mother    Social History  Substance Use Topics  . Smoking status: Never Smoker   . Smokeless tobacco: Never Used  . Alcohol Use: No   OB History    No data available     Review of Systems  Constitutional: Negative.   HENT: Negative.   Respiratory: Negative.   Cardiovascular: Negative.   Gastrointestinal: Negative.   Musculoskeletal: Positive for arthralgias and gait problem.       Walks with cane  Skin: Negative.   Allergic/Immunologic: Positive for immunocompromised state.       Diabetic  Neurological: Negative.   Psychiatric/Behavioral: Negative.    All other systems reviewed and are negative.     Allergies  Aspirin; Nsaids; Meloxicam; Penicillins; Percocet; Tramadol; and Vicodin  Home Medications   Prior to Admission medications   Medication Sig Start Date End Date Taking? Authorizing Provider  albuterol (PROVENTIL HFA;VENTOLIN HFA) 108 (90 BASE) MCG/ACT inhaler Inhale 2 puffs into the lungs every 6 (six) hours as needed. Shortness of breath    Historical Provider, MD  albuterol (PROVENTIL) (2.5 MG/3ML) 0.083% nebulizer solution Take 2.5 mg by nebulization every 2 (two) hours as needed for wheezing or shortness of breath.     Historical Provider, MD  furosemide (LASIX) 20 MG tablet Take 1 tablet (20 mg total) by mouth daily. 05/17/14   Joycie Peek, PA-C  losartan (COZAAR) 25 MG tablet Take 1 tablet (25 mg total) by mouth daily. 02/14/14   Kalman Shan, MD  metFORMIN (GLUCOPHAGE) 1000 MG tablet Take 1 tablet (1,000 mg total) by mouth 2 (two) times daily with a meal. 03/26/14   Clydia Llano, MD  methocarbamol (ROBAXIN) 500 MG tablet Take 1 tablet (500 mg total) by mouth every 8 (eight) hours as needed for muscle spasms. 06/04/14   Marisa Severin, MD  omeprazole (PRILOSEC) 20 MG capsule Take 1 capsule (20 mg total) by mouth daily. 10/18/14   Santiago Glad, PA-C  oseltamivir (TAMIFLU) 75 MG capsule Take 1 capsule (75 mg total) by mouth  2 (two) times daily. Patient not taking: Reported on 05/17/2014 03/26/14   Clydia Llano, MD  predniSONE (DELTASONE) 10 MG tablet Take 6-5-4-3-2-1 PO daily till gone Patient not taking: Reported on 05/17/2014 03/26/14   Clydia Llano, MD   BP 160/106 mmHg  Pulse 65  Temp(Src) 97.9 F (36.6 C) (Oral)  Resp 20  LMP 02/05/2015 Physical Exam  Constitutional: She appears well-developed and well-nourished. No distress.  HENT:  Head: Normocephalic and atraumatic.  Eyes: Conjunctivae are normal. Pupils are equal, round, and reactive to light.  Neck: Neck supple. No tracheal deviation present. No thyromegaly  present.  Cardiovascular: Normal rate and regular rhythm.   No murmur heard. Pulmonary/Chest: Effort normal and breath sounds normal.  Abdominal: Soft. Bowel sounds are normal. She exhibits no distension. There is no tenderness.  Morbidly obese  Musculoskeletal: Normal range of motion. She exhibits no edema or tenderness.  Pelvis stable. Right lower extremity Mild tenderness overlying right groin. No pain at hip on internal or external rotation. She is minimally tender over the lateral malleolus and over right heel. Negative Thompson sign. No soft tissue swelling or ecchymosis. DP pulse 2+. All other extremities without contusion abrasion or tenderness neurovascular intact.  Neurological: She is alert. Coordination normal.  Skin: Skin is warm and dry. No rash noted.  Psychiatric: She has a normal mood and affect.  Nursing note and vitals reviewed.   ED Course  Procedures (including critical care time) Labs Review Labs Reviewed - No data to display  Imaging Review No results found. I have personally reviewed and evaluated these images and lab results as part of my medical decision-making.   EKG Interpretation None     X-rays viewed by me. Results for orders placed or performed during the hospital encounter of 10/18/14  Brain natriuretic peptide (order if patient c/o SOB ONLY)  Result Value Ref Range   B Natriuretic Peptide 26.6 0.0 - 100.0 pg/mL  Comprehensive metabolic panel  Result Value Ref Range   Sodium 141 135 - 145 mmol/L   Potassium 4.1 3.5 - 5.1 mmol/L   Chloride 109 101 - 111 mmol/L   CO2 25 22 - 32 mmol/L   Glucose, Bld 88 65 - 99 mg/dL   BUN 9 6 - 20 mg/dL   Creatinine, Ser 1.61 0.44 - 1.00 mg/dL   Calcium 9.3 8.9 - 09.6 mg/dL   Total Protein 6.5 6.5 - 8.1 g/dL   Albumin 3.5 3.5 - 5.0 g/dL   AST 18 15 - 41 U/L   ALT 19 14 - 54 U/L   Alkaline Phosphatase 68 38 - 126 U/L   Total Bilirubin 0.6 0.3 - 1.2 mg/dL   GFR calc non Af Amer >60 >60 mL/min   GFR calc Af  Amer >60 >60 mL/min   Anion gap 7 5 - 15  CBC  Result Value Ref Range   WBC 5.0 4.0 - 10.5 K/uL   RBC 4.00 3.87 - 5.11 MIL/uL   Hemoglobin 11.1 (L) 12.0 - 15.0 g/dL   HCT 04.5 (L) 40.9 - 81.1 %   MCV 87.0 78.0 - 100.0 fL   MCH 27.8 26.0 - 34.0 pg   MCHC 31.9 30.0 - 36.0 g/dL   RDW 91.4 (H) 78.2 - 95.6 %   Platelets 232 150 - 400 K/uL  I-stat troponin, ED (not at [facility])  Result Value Ref Range   Troponin i, poc 0.00 0.00 - 0.08 ng/mL   Comment 3  Dg Ankle Complete Right  02/07/2015  CLINICAL DATA:  Fall today with right ankle pain. Initial encounter. EXAM: RIGHT ANKLE - COMPLETE 3+ VIEW COMPARISON:  None. FINDINGS: No evidence of acute fracture or dislocation. Ankle mortise shows normal alignment. Diffuse soft tissue prominence present surrounding the ankle. No soft tissue foreign body. IMPRESSION: No acute fracture. Electronically Signed   By: Irish Lack M.D.   On: 02/07/2015 09:33   Dg Foot Complete Right  02/07/2015  CLINICAL DATA:  Acute right foot pain after fall today. Initial encounter. EXAM: RIGHT FOOT COMPLETE - 3+ VIEW COMPARISON:  None. FINDINGS: There is no evidence of fracture or dislocation. There is no evidence of arthropathy. Spurring of posterior calcaneus is noted. Soft tissues are unremarkable. IMPRESSION: No acute abnormality seen in the right foot. Electronically Signed   By: Lupita Raider, M.D.   On: 02/07/2015 09:30   Dg Hip Unilat With Pelvis 1v Right  02/07/2015  CLINICAL DATA:  Acute right hip pain after fall today. EXAM: DG HIP (WITH OR WITHOUT PELVIS) 1V RIGHT COMPARISON:  None. FINDINGS: There is no evidence of hip fracture or dislocation. There is no evidence of arthropathy or other focal bone abnormality. IMPRESSION: Normal right hip. Electronically Signed   By: Lupita Raider, M.D.   On: 02/07/2015 09:31    Feels much improved after treatment with Tylenol. She was given a cam walker for her left lower extremity to replace the damaged  boot which she finds comfortable MDM  Patient is on [facility] list [facility]. If she has further problems she can follow-up with her orthopedist in [place], Dr. Renae Fickle. Pretest clinical suspicion for right hip fracture extremely well Blood pressure recheck 3 weeks. She is encouraged to use her cane. Tylenol as needed for pain Diagnosis #1 fall #2 right groin strain #3 pain in right foot and right ankle Final diagnoses:  None  #4 elevated blood pressure      Doug Sou, MD 02/07/15 1015

## 2015-02-07 NOTE — Discharge Instructions (Signed)
Take Tylenol as directed for pain. Wear the Cam Walker and the boot for your right leg as needed for comfort. Use your cane at all times to prevent falls. Follow-up with Dr.Paul if you have problems with your legs between now and the time your evaluated [facility] for surgery. Get your blood pressure rechecked within the next 3 weeks. Today's was mildly elevated at 155/93.

## 2015-05-27 ENCOUNTER — Encounter (HOSPITAL_COMMUNITY): Payer: Self-pay | Admitting: *Deleted

## 2015-05-27 ENCOUNTER — Emergency Department (HOSPITAL_COMMUNITY)
Admission: EM | Admit: 2015-05-27 | Discharge: 2015-05-27 | Disposition: A | Discharge status: Home / Self Care | Payer: Medicaid Other | Attending: Emergency Medicine | Admitting: Emergency Medicine

## 2015-05-27 DIAGNOSIS — J45909 Unspecified asthma, uncomplicated: Secondary | ICD-10-CM | POA: Diagnosis not present

## 2015-05-27 DIAGNOSIS — I10 Essential (primary) hypertension: Secondary | ICD-10-CM | POA: Diagnosis not present

## 2015-05-27 DIAGNOSIS — M25521 Pain in right elbow: Secondary | ICD-10-CM | POA: Diagnosis present

## 2015-05-27 NOTE — ED Notes (Signed)
Pt reports R elbow pain and very mild swelling x 3 weeks.  Getting worse.  Pt reports hx of tendonitis in her L shoulder and bila carpal tunnel.  Pt reports pain radiates from her elbow to her hand.

## 2015-05-29 ENCOUNTER — Encounter (HOSPITAL_COMMUNITY): Payer: Self-pay | Admitting: *Deleted

## 2015-05-29 ENCOUNTER — Emergency Department (HOSPITAL_COMMUNITY)
Admission: EM | Admit: 2015-05-29 | Discharge: 2015-05-29 | Disposition: A | Discharge status: Home / Self Care | Payer: Medicaid Other | Attending: Emergency Medicine | Admitting: Emergency Medicine

## 2015-05-29 DIAGNOSIS — M1990 Unspecified osteoarthritis, unspecified site: Secondary | ICD-10-CM | POA: Insufficient documentation

## 2015-05-29 DIAGNOSIS — Z791 Long term (current) use of non-steroidal anti-inflammatories (NSAID): Secondary | ICD-10-CM | POA: Diagnosis not present

## 2015-05-29 DIAGNOSIS — Z79899 Other long term (current) drug therapy: Secondary | ICD-10-CM | POA: Insufficient documentation

## 2015-05-29 DIAGNOSIS — M25521 Pain in right elbow: Secondary | ICD-10-CM | POA: Diagnosis present

## 2015-05-29 DIAGNOSIS — Z7984 Long term (current) use of oral hypoglycemic drugs: Secondary | ICD-10-CM | POA: Insufficient documentation

## 2015-05-29 DIAGNOSIS — J45909 Unspecified asthma, uncomplicated: Secondary | ICD-10-CM | POA: Insufficient documentation

## 2015-05-29 DIAGNOSIS — I10 Essential (primary) hypertension: Secondary | ICD-10-CM | POA: Diagnosis not present

## 2015-05-29 DIAGNOSIS — G4733 Obstructive sleep apnea (adult) (pediatric): Secondary | ICD-10-CM | POA: Diagnosis not present

## 2015-05-29 DIAGNOSIS — Z7951 Long term (current) use of inhaled steroids: Secondary | ICD-10-CM | POA: Insufficient documentation

## 2015-05-29 MED ORDER — ACETAMINOPHEN 500 MG PO TABS: 1000.0000 mg | ORAL_TABLET | Freq: Four times a day (QID) | ORAL | Status: DC | PRN

## 2015-05-29 MED ORDER — PREDNISONE 20 MG PO TABS: 40.0000 mg | ORAL_TABLET | Freq: Every day | ORAL | Status: DC

## 2015-05-29 MED ORDER — ACETAMINOPHEN 500 MG PO TABS
1000.0000 mg | ORAL_TABLET | Freq: Once | ORAL | Status: AC
  Administered 2015-05-29: 1000 mg via ORAL
  Filled 2015-05-29: qty 2

## 2015-05-29 MED ORDER — PREDNISONE 20 MG PO TABS
60.0000 mg | ORAL_TABLET | Freq: Once | ORAL | Status: AC
  Administered 2015-05-29: 60 mg via ORAL
  Filled 2015-05-29: qty 3

## 2015-05-29 NOTE — Discharge Instructions (Signed)
Please call Dr. Nolon Nationsalldorf's office to schedule an orthopedic follow up. Take the entire course of steroids as prescribed. You may take the Tylenol as needed as well. Return to the ER for new or worsening symptoms.

## 2015-05-29 NOTE — ED Provider Notes (Signed)
CSN: [id number]     Arrival date & time 05/29/15  1546 History  By signing my name below, I, Placido Sou, attest that this documentation has been prepared under the direction and in the presence of Axcel Horsch Y Yahir Tavano, [place]. Electronically Signed: Placido Sou, ED Scribe. 05/29/2015. 4:39 PM.   Chief Complaint  Patient presents with  . Arm Pain   The history is provided by the patient. A language interpreter was used.    HPI Comments: Melanie Hess is a 45 y.o. female who presents to the Emergency Department complaining of worsening, moderate, atraumatic, right elbow pain and swelling x 3 weeks. She states her pain worsens with palpation, when gripping objects and with movement. Pt has taken muscle relaxers and applied heat/ice without significant pain relief. She reports a hx of tendonitis in her left shoulder and denies this pain feels similar. She reports an allergy to ibuprofen which causes lingual swelling. She denies fevers, chills or any other associated symptoms at this time.   Past Medical History  Diagnosis Date  . Migraine   . Asthma   . Ankle fracture, right 2001  . Plantar fasciitis of right foot   . Hypertension   . OSA (obstructive sleep apnea)   . Arthritis    Past Surgical History  Procedure Laterality Date  . Tubal ligation     Family History  Problem Relation Age of Onset  . Breast cancer Maternal Aunt   . Brain cancer Cousin   . Hypertension Mother    Social History  Substance Use Topics  . Smoking status: Never Smoker   . Smokeless tobacco: Never Used  . Alcohol Use: No   OB History    No data available     Review of Systems A complete 10 system review of systems was obtained and all systems are negative except as noted in the HPI and PMH.   Allergies  Aspirin; Nsaids; Meloxicam; Penicillins; Percocet; Tramadol; and Vicodin  Home Medications   Prior to Admission medications   Medication Sig Start Date End Date Taking? Authorizing Provider   acetaminophen (TYLENOL) 500 MG tablet Take 1,000-1,500 mg by mouth every 6 (six) hours as needed for moderate pain or headache.    Historical Provider, MD  albuterol (PROVENTIL) (2.5 MG/3ML) 0.083% nebulizer solution Take 2.5 mg by nebulization 2 (two) times daily as needed for wheezing or shortness of breath.     Historical Provider, MD  furosemide (LASIX) 20 MG tablet Take 1 tablet (20 mg total) by mouth daily. 05/17/14   Joycie Peek, PA-C  losartan (COZAAR) 25 MG tablet Take 1 tablet (25 mg total) by mouth daily. 02/14/14   Kalman Shan, MD  meloxicam (MOBIC) 15 MG tablet Take 15 mg by mouth daily. 12/23/14   Historical Provider, MD  metFORMIN (GLUCOPHAGE) 1000 MG tablet Take 1 tablet (1,000 mg total) by mouth 2 (two) times daily with a meal. 03/26/14   Clydia Llano, MD  metFORMIN (GLUCOPHAGE-XR) 500 MG 24 hr tablet Take 1,000 mg by mouth 2 (two) times daily. 12/20/14   Historical Provider, MD  methocarbamol (ROBAXIN) 500 MG tablet Take 1 tablet (500 mg total) by mouth every 8 (eight) hours as needed for muscle spasms. 06/04/14   Marisa Severin, MD  omeprazole (PRILOSEC) 20 MG capsule Take 1 capsule (20 mg total) by mouth daily. Patient taking differently: Take 20 mg by mouth daily as needed (acid reflux).  10/18/14   Heather Laisure, PA-C   BP 196/106 mmHg  Pulse 75  Temp(Src) 98.4 F (36.9 C) (Oral)  Resp 20  SpO2 98%  LMP 05/22/2015    Physical Exam  Constitutional: She is oriented to person, place, and time. She appears well-developed and well-nourished.  HENT:  Head: Normocephalic and atraumatic.  Eyes: EOM are normal.  Neck: Normal range of motion.  Cardiovascular: Normal rate.   Pulmonary/Chest: Effort normal. No respiratory distress.  Abdominal: Soft.  Musculoskeletal: Normal range of motion.  Right elbow with radial ttp. Pain with supination. No edema or discoloration. FROM. Intact strength and sensation. 2+ distal pulses.   Neurological: She is alert and oriented to person,  place, and time.  Skin: Skin is warm and dry.  Psychiatric: She has a normal mood and affect.  Nursing note and vitals reviewed.   ED Course  Procedures  DIAGNOSTIC STUDIES: Oxygen Saturation is 98% on RA, normal by my interpretation.    COORDINATION OF CARE: 4:36 PM Discussed next steps with pt. She verbalized understanding and is agreeable with the plan.   Labs Review Labs Reviewed - No data to display  Imaging Review No results found.   EKG Interpretation None      MDM   Final diagnoses:  Right elbow pain    Pt with chronic progressive right elbow/arm pain. She feels it is similar to tendinitis she has had in her shoulder. She does not want x-rays today which I think is reasonable. Due to her NSAID allergy will give dose of prednisone here and rx for home. Encouraged RICE therapy and alternating with heat. Instructed close f/u with ortho for further evaluation and management. ER return precuations given.   I personally performed the services described in this documentation, which was scribed in my presence. The recorded information has been reviewed and is accurate.   Carlene CoriaSerena Y Dhiren Azimi, PA-C 05/29/15 2028  Gerhard Munchobert Lockwood, MD 05/29/15 2242

## 2015-05-29 NOTE — ED Notes (Signed)
Per pt report: pt report right arm pain x 3 weeks. 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  Pt hurts more with movement.  Pt works in a daycare.  Pt denies any injuries. 0

## 2015-07-14 ENCOUNTER — Other Ambulatory Visit: Payer: Self-pay | Admitting: Orthopaedic Surgery

## 2015-07-21 ENCOUNTER — Ambulatory Visit (INDEPENDENT_AMBULATORY_CARE_PROVIDER_SITE_OTHER): Payer: Medicaid Other | Admitting: Internal Medicine

## 2015-07-21 ENCOUNTER — Encounter: Payer: Self-pay | Admitting: Internal Medicine

## 2015-07-21 VITALS — BP 164/112 | HR 70 | Ht 66.0 in | Wt 399.6 lb

## 2015-07-21 DIAGNOSIS — R0683 Snoring: Secondary | ICD-10-CM | POA: Diagnosis not present

## 2015-07-21 DIAGNOSIS — I10 Essential (primary) hypertension: Secondary | ICD-10-CM

## 2015-07-21 DIAGNOSIS — G4719 Other hypersomnia: Secondary | ICD-10-CM

## 2015-07-21 MED ORDER — AMLODIPINE BESYLATE 5 MG PO TABS: 5.0000 mg | ORAL_TABLET | Freq: Every day | ORAL | Status: DC

## 2015-07-21 MED ORDER — LISINOPRIL 20 MG PO TABS: 20.0000 mg | ORAL_TABLET | Freq: Every day | ORAL | Status: DC

## 2015-07-21 NOTE — Patient Instructions (Addendum)
Your physician has recommended that you have a sleep study @ [facility] at [phone]. This test records several body functions during sleep, including: brain activity, eye movement, oxygen and carbon dioxide blood levels, heart rate and rhythm, breathing rate and rhythm, the flow of air through your mouth and nose, snoring, body muscle movements, and chest and belly movement.  Your physician has recommended you make the following changes in your medication: STOP taking the Lisinopril 2.5mg .  START Lisinopril 20mg  once a day, a new rx will been sent to the pharmacy.  START Amlodipine 5mg  once a day, a new rx has been sent to your pharmacy.   Your Physician Dr Rennis GoldenHilty recommends that you schedule a follow-up appointment in: 3 Months   Your physician recommends that you schedule a follow-up appointment in: 1 week with the [facility] with a clinical pharmacist.

## 2015-07-21 NOTE — Progress Notes (Signed)
OFFICE NOTE  Chief Complaint:  Recurrent asthma, fatigue  Primary Care Physician: Karie ChimeraEESE,BETTI D, MD  HPI:  Phillips OdorCheryl A Diaz-Ramirez is a pleasant 45 year old female, referred to me by Dr. Mayford KnifeWilliams. She's had numerous recent emergency department visits for asthma. She has wheezing and shortness of breath for which she receives nebulizers and steroids and has improvement. She recently saw Dr. Marchelle Gearingamaswamy in pulmonary critical care who recommended a number of tests including an echocardiogram. That has not yet been performed. There is some concern that this may not all be related to asthma. She does have a history of hypertension and morbid obesity. She reports some chest wall tenderness which is reproducible to palpation but no anginal chest pain.  07/21/2015  Mrs. Derrell LollingRamirez returns today for follow-up. She reports recently that she's had worsening asthma and has had numerous ER visits. Her echocardiogram showed normal LV function. She's had worsening weight gain and now has a BMI of 64. She was [facility] to [facility] for their parents of weight management program. Blood pressure today is very poorly controlled at 164/112, which she says is the best blood pressure C seen in a while. I asked her what a normal blood pressure was and she said that she thought this was normal. I educated her that I would ideally like to see her blood pressure 120/80. We reviewed her medicines today and she is only on lisinopril 2.5 mg daily. When I last saw her I recommended a sleep study which she never scheduled.  PMHx:  Past Medical History  Diagnosis Date  . Migraine   . Asthma   . Ankle fracture, right 2001  . Plantar fasciitis of right foot   . Hypertension   . OSA (obstructive sleep apnea)   . Arthritis     Past Surgical History  Procedure Laterality Date  . Tubal ligation    . Carpal tunnel release Left     FAMHx:  Family History  Problem Relation Age of Onset  . Breast cancer Maternal Aunt     . Brain cancer Cousin   . Hypertension Mother     SOCHx:   reports that she has never smoked. She has never used smokeless tobacco. She reports that she does not drink alcohol or use illicit drugs.  ALLERGIES:  Allergies  Allergen Reactions  . Aspirin Shortness Of Breath and Swelling  . Nsaids Shortness Of Breath and Swelling  . Meloxicam Other (See Comments)    Told not to take med  . Penicillins Swelling    Has patient had a PCN reaction causing immediate rash, facial/tongue/throat swelling, SOB or lightheadedness with hypotension: Yes Has patient had a PCN reaction causing severe rash involving mucus membranes or skin necrosis: Yes Has patient had a PCN reaction that required hospitalization No Has patient had a PCN reaction occurring within the last 10 years: No If all of the above answers are "NO", then may proceed with Cephalosporin use.   Marland Kitchen. Percocet [Oxycodone-Acetaminophen] Hives and Itching  . Tramadol Nausea Only  . Vicodin [Hydrocodone-Acetaminophen] Hives and Itching    ROS: Pertinent items noted in HPI and remainder of comprehensive ROS otherwise negative.  HOME MEDS: Current Outpatient Prescriptions  Medication Sig Dispense Refill  . acetaminophen (TYLENOL) 500 MG tablet Take 1,000-1,500 mg by mouth every 6 (six) hours as needed for moderate pain or headache.    Marland Kitchen. acetaminophen (TYLENOL) 500 MG tablet Take 2 tablets (1,000 mg total) by mouth every 6 (six) hours as needed. 30  tablet 0  . albuterol (PROVENTIL) (2.5 MG/3ML) 0.083% nebulizer solution Take 2.5 mg by nebulization 2 (two) times daily as needed for wheezing or shortness of breath.     . furosemide (LASIX) 20 MG tablet Take 1 tablet (20 mg total) by mouth daily. 30 tablet 0  . lisinopril (PRINIVIL,ZESTRIL) 2.5 MG tablet Take 1 tablet by mouth daily.  2  . meloxicam (MOBIC) 15 MG tablet Take 15 mg by mouth daily.  0  . metFORMIN (GLUCOPHAGE-XR) 500 MG 24 hr tablet Take 1,000 mg by mouth 2 (two) times  daily.  3  . methocarbamol (ROBAXIN) 500 MG tablet Take 1 tablet (500 mg total) by mouth every 8 (eight) hours as needed for muscle spasms. 20 tablet 0  . omeprazole (PRILOSEC) 20 MG capsule Take 1 capsule (20 mg total) by mouth daily. (Patient taking differently: Take 20 mg by mouth daily as needed (acid reflux). ) 30 capsule 0  . predniSONE (DELTASONE) 20 MG tablet Take 2 tablets (40 mg total) by mouth daily. 6 tablet 0   No current facility-administered medications for this visit.    LABS/IMAGING: No results found for this or any previous visit (from the past 48 hour(s)). No results found.  VITALS: BP 164/112 mmHg  Pulse 70  Ht 5\' 6"  (1.676 m)  Wt 399 lb 9.6 oz (181.257 kg)  BMI 64.53 kg/m2  EXAM: General appearance: alert, no distress and morbidly obese Neck: no carotid bruit and no JVD Lungs: clear to auscultation bilaterally Heart: regular rate and rhythm, S1, S2 normal, no murmur, click, rub or gallop Abdomen: soft, non-tender; bowel sounds normal; no masses,  no organomegaly Extremities: edema trace pedal, varicose veins noted and venous stasis dermatitis noted Pulses: 2+ and symmetric Skin: Skin color, texture, turgor normal. No rashes or lesions Neurologic: Grossly normal Psych: Pleasant  EKG: Normal sinus rhythm at 70  ASSESSMENT: 1. Asthma with frequent exacerbations 2. Dyspnea on exertion 3. Super morbid obesity 4. Malignant hypertension  PLAN: 1.   Mrs. Lowella GripDiaz-Ramirez has had frequent ER visits with exacerbations of asthma. No clear cause of her asthma has been elucidated. She has super morbid obesity with gaining weight and has been referred to [facility] for comprehensive weight management. She is at high risk for sleep apnea and has not scheduled her sleep study. We again gave her contact information and there is a standing order for that study. Finally, blood pressure is very poorly controlled and her understanding of adequate blood pressure control is  limited as well. She is only on lisinopril 2.5 mg daily from what I can tell. I will prescribe her lisinopril 20 mg daily and asked her to discard her current prescription. We'll also add amlodipine 5 mg daily for increased blood pressure control. Plan follow-up in one week with her pharmacist for blood pressure management as she is scheduled to have foot surgery on July 14. Blood pressure will need to be better controlled before that.  Follow-up with me in 3 months.  Chrystie NoseKenneth C. Hilty, MD, [facility] Attending Cardiologist [facility]  Chrystie NoseKenneth C Hilty 07/21/2015, 9:46 AM

## 2015-07-23 ENCOUNTER — Encounter: Payer: Self-pay | Admitting: Internal Medicine

## 2015-07-23 ENCOUNTER — Ambulatory Visit (INDEPENDENT_AMBULATORY_CARE_PROVIDER_SITE_OTHER): Payer: Medicaid Other | Admitting: Internal Medicine

## 2015-07-23 VITALS — BP 142/86 | HR 79 | Ht 66.0 in | Wt >= 6400 oz

## 2015-07-23 DIAGNOSIS — J45901 Unspecified asthma with (acute) exacerbation: Secondary | ICD-10-CM | POA: Diagnosis not present

## 2015-07-23 MED ORDER — FLUTICASONE FUROATE-VILANTEROL 100-25 MCG/INH IN AEPB: 1.0000 | INHALATION_SPRAY | Freq: Every day | RESPIRATORY_TRACT | Status: DC

## 2015-07-23 MED ORDER — PREDNISONE 10 MG PO TABS: ORAL_TABLET | ORAL | Status: DC

## 2015-07-23 NOTE — Progress Notes (Signed)
History of Present Illness Melanie Hess is an obese 45 y.o. female with BMI of 2161, non-smoker with history of asthma since childhood followed by Dr. Marchelle Hess.   7/5/2017Acute Office Visit: Pt. Presents today for an acute flare of asthma. She was last seen in the office January of 2016. She did not follow up follow up after the Methacholine challenge test. She is currently using only albuterol nebulizer treatments for her asthma three times daily, therefore her asthma is poorly controlled.Melanie Hess.Melanie Hess.She had a recent admission in March for influenza B and pneumonia. She was treated with Oxygen,  Tamiflu , Rocephin and Azithromycin in addition to scheduled BD, anti-tussives, and mucolytics.She presents today with worsening dyspnea and what she describes as an asthma attack. She states her weight is up by several pounds.She weighs 400 pounds at today's visit. [facility]  for comprehensive weight management.Additionally her blood pressure has been poorly controlled. She is scheduled for foot surgery July 14th . She states she is not here for surgical clearance. She stated that surgery only requested cardiac clearance. She does not take any maintenance for her asthma.She is scheduled for a sleep study August 28th.This was ordered by her cardiologist.She has a follow up appointment   ACQ Score today: 3 Average  FENO:Attempted in office 12 times, but unable to complete test.  Tests: Methylcholine Challenge.02/26/2014  The FVC, FEV1, FEV1/FVC ratio and FEF25-75% are reduced indicating airway obstruction. Following administration of bronchodilators, there is no significant response.  Conclusions: Methacholine Inhalation Challenge Pulmonary Function Diagnosis: Moderate Obstructive Airways Disease -Peripheral Airway Positive for hyper-reactive airways at 106.56 CDU's  02/28/2014: Echo: EF:55-60% Trivial mitral valve regurgitation PAP normal   Past medical hx Past Medical  History  Diagnosis Date  . Migraine   . Asthma   . Ankle fracture, right 2001  . Plantar fasciitis of right foot   . Hypertension   . OSA (obstructive sleep apnea)   . Arthritis      Past surgical hx, Family hx, Social hx all reviewed.  Current Outpatient Prescriptions on File Prior to Visit  Medication Sig  . acetaminophen (TYLENOL) 500 MG tablet Take 1,000-1,500 mg by mouth every 6 (six) hours as needed for moderate pain or headache.  Melanie Hess. acetaminophen (TYLENOL) 500 MG tablet Take 2 tablets (1,000 mg total) by mouth every 6 (six) hours as needed.  Melanie Hess. albuterol (PROVENTIL) (2.5 MG/3ML) 0.083% nebulizer solution Take 2.5 mg by nebulization 2 (two) times daily as needed for wheezing or shortness of breath.   Melanie Hess. amLODipine (NORVASC) 5 MG tablet Take 1 tablet (5 mg total) by mouth daily.  . furosemide (LASIX) 20 MG tablet Take 1 tablet (20 mg total) by mouth daily.  Melanie Hess. lisinopril (PRINIVIL,ZESTRIL) 20 MG tablet Take 1 tablet (20 mg total) by mouth daily.  . meloxicam (MOBIC) 15 MG tablet Take 15 mg by mouth daily.  . metFORMIN (GLUCOPHAGE-XR) 500 MG 24 hr tablet Take 1,000 mg by mouth 2 (two) times daily.  . methocarbamol (ROBAXIN) 500 MG tablet Take 1 tablet (500 mg total) by mouth every 8 (eight) hours as needed for muscle spasms.  . predniSONE (DELTASONE) 20 MG tablet Take 2 tablets (40 mg total) by mouth daily.   No current facility-administered medications on file prior to visit.     Allergies  Allergen Reactions  . Aspirin Shortness Of Breath and Swelling  . Nsaids Shortness Of Breath and Swelling  . Meloxicam Other (See Comments)    Told not to  take med  . Penicillins Swelling    Has patient had a PCN reaction causing immediate rash, facial/tongue/throat swelling, SOB or lightheadedness with hypotension: Yes Has patient had a PCN reaction causing severe rash involving mucus membranes or skin necrosis: Yes Has patient had a PCN reaction that required hospitalization No Has  patient had a PCN reaction occurring within the last 10 years: No If all of the above answers are "NO", then may proceed with Cephalosporin use.   Melanie Hess. Percocet [Oxycodone-Acetaminophen] Hives and Itching  . Tramadol Nausea Only  . Vicodin [Hydrocodone-Acetaminophen] Hives and Itching    Review Of Systems:  Constitutional:   No  weight loss, night sweats,  Fevers, chills, fatigue, or  lassitude.  HEENT:   No headaches,  Difficulty swallowing,  Tooth/dental problems, or  Sore throat,                No sneezing, itching, ear ache, nasal congestion, post nasal drip,   CV:  No chest pain,  + Orthopnea, PND, + swelling in lower extremities, anasarca, dizziness, palpitations, syncope.   GI  No heartburn, indigestion, abdominal pain, nausea, vomiting, diarrhea, change in bowel habits, loss of appetite, bloody stools.   Resp: + shortness of breath with exertion less at rest.  No excess mucus, no productive cough,  + non-productive cough,  No coughing up of blood.  No change in color of mucus.  + wheezing.  No chest wall deformity  Skin: no rash or lesions.  GU: no dysuria, change in color of urine, no urgency or frequency.  No flank pain, no hematuria   MS:  No joint pain or swelling.  No decreased range of motion.  No back pain.  Psych:  No change in mood or affect. No depression or anxiety.  No memory loss.   Vital Signs BP 142/86 mmHg  Pulse 79  Ht 5\' 6"  (1.676 m)  Wt 400 lb (181.439 kg)  BMI 64.59 kg/m2  SpO2 98%   Physical Exam:  General- No distress,  A&Ox3, morbidly obese ENT: No sinus tenderness, TM clear, pale nasal mucosa, no oral exudate,no post nasal drip, no LAN Cardiac: S1, S2, regular rate and rhythm, no murmur Chest: No wheeze/ rales/ dullness; no accessory muscle use, no nasal flaring, no sternal retractions Abd.: Soft Non-tender Ext: No clubbing cyanosis, + trace edema lower extremities Neuro:  normal strength Skin: No rashes, warm and dry Psych: normal mood  and behavior   Assessment/Plan  Asthma   Poorly controlled asthma in a patient on no ,Maintenence medication. Plan: We will do a FENO today in the office to check for airway inflammation. We will start you on Breo as a maintenance inhaler for your asthma one puff once daily  This is your maintenance medication and you take it every day without fail. Please rinse your mouth or brush your teeth every day.after use. We will also start you on a Prednisone taper; 10 mg tablets: 4 tabs x 2 days, 3 tabs x 2 days, 2 tabs x 2 days 1 tab x 2 days then stop.  Please have your PCP check your thyroid function regarding your weight gain. Follow up with NP or Ramaswamy in 6 weeks. Please contact office for sooner follow up if symptoms do not improve or worsen or seek emergency care       Bevelyn NgoSarah F Groce, NP 07/23/2015  3:10 PM      STAFF NOTE: I, Dr Lavinia SharpsM Ramaswamy have personally reviewed patient's available data, including  medical history, events of note, physical examination and test results as part of my evaluation. I have discussed with resident/NP and other care providers such as pharmacist, RN and RRT.  In addition,  I personally evaluated patient and elicited key findings of   S: ashtma based on hx and MCT test - not seen in 18 months. Now having foot surgery in 10 days and having  Lot of symptoms. Using lot of alb for rescue. Tried to get FeNO for asthma eval - but could not get her to do  O: OBese cta biltareallly but overall diminished    A: ASthma ? True , if true ? Flare up Preop foot surgery  P: empiric prednisone and start breo so she is safe for foot surgery ROV in 2 months Emphasized complicance   .  Rest per NP/medical resident whose note is outlined above and that I agree with   Dr. Kalman Shan, M.D., [facility].C.P Pulmonary and Critical Care Medicine Staff Physician [facility] Pager: [phone], If no answer or between   15:00h - 7:00h: call [phone]  07/23/2015 4:49 PM

## 2015-07-23 NOTE — Patient Instructions (Addendum)
It is nice to meet you today. We will do a FENO today in the office to check for airway inflammation. We will start you on Breo as a maintenance inhaler for your asthma one puff once daily  This is your maintenance medication and you take it every day without fail. Please rinse your mouth or brush your teeth every day.after use. We will also start you on a Prednisone taper; 10 mg tablets: 4 tabs x 2 days, 3 tabs x 2 days, 2 tabs x 2 days 1 tab x 2 days then stop.  Please have your PCP check your thyroid function regarding your weight gain. Follow up with NP or Ramaswamy in 6 weeks. Please contact office for sooner follow up if symptoms do not improve or worsen or seek emergency care

## 2015-07-23 NOTE — Assessment & Plan Note (Signed)
   Poorly controlled asthma in a patient on no ,Maintenence medication. Plan: We will do a FENO today in the office to check for airway inflammation. We will start you on Breo as a maintenance inhaler for your asthma one puff once daily  This is your maintenance medication and you take it every day without fail. Please rinse your mouth or brush your teeth every day.after use. We will also start you on a Prednisone taper; 10 mg tablets: 4 tabs x 2 days, 3 tabs x 2 days, 2 tabs x 2 days 1 tab x 2 days then stop.  Please have your PCP check your thyroid function regarding your weight gain. Follow up with NP or Ramaswamy in 6 weeks. Please contact office for sooner follow up if symptoms do not improve or worsen or seek emergency care

## 2015-07-23 NOTE — H&P (Signed)
Melanie Hess is an 45 y.o. female.   Chief Complaint: Right heel pain HPI: Melanie MaxwellCheryl says her greatest problem is the right heel. She's struggled for at least a couple of years with right greater than left heel pain. She's been unable to wear shoes for a couple years due to her heel pain. She gets by in sandals. The pain is constant and severe. She has trouble resting at night. She's tried injections through Dr. Renae FicklePaul and has had shoe inserts made as well. She's even had an MRI scan of the right heel.   Radiographs:  X-rays that were ordered, performed, and interpreted by me today included 2 views of both heels. She has large insertional spurs and prominent pump bumps on both sides.  Past Medical History  Diagnosis Date  . Migraine   . Asthma   . Ankle fracture, right 2001  . Plantar fasciitis of right foot   . Hypertension   . OSA (obstructive sleep apnea)   . Arthritis     Past Surgical History  Procedure Laterality Date  . Tubal ligation    . Carpal tunnel release Left     Family History  Problem Relation Age of Onset  . Breast cancer Maternal Aunt   . Brain cancer Cousin   . Hypertension Mother    Social History:  reports that she has never smoked. She has never used smokeless tobacco. She reports that she does not drink alcohol or use illicit drugs.  Allergies:  Allergies  Allergen Reactions  . Aspirin Shortness Of Breath and Swelling  . Nsaids Shortness Of Breath and Swelling  . Meloxicam Other (See Comments)    Told not to take med  . Penicillins Swelling    Has patient had a PCN reaction causing immediate rash, facial/tongue/throat swelling, SOB or lightheadedness with hypotension: Yes Has patient had a PCN reaction causing severe rash involving mucus membranes or skin necrosis: Yes Has patient had a PCN reaction that required hospitalization No Has patient had a PCN reaction occurring within the last 10 years: No If all of the above answers are "NO", then may  proceed with Cephalosporin use.   Marland Kitchen. Percocet [Oxycodone-Acetaminophen] Hives and Itching  . Tramadol Nausea Only  . Vicodin [Hydrocodone-Acetaminophen] Hives and Itching    No prescriptions prior to admission    No results found for this or any previous visit (from the past 48 hour(s)). No results found.  Review of Systems  Musculoskeletal: Positive for joint pain.       Right heel pain  All other systems reviewed and are negative.   There were no vitals taken for this visit. Physical Exam  Constitutional: She is oriented to person, place, and time. She appears well-developed and well-nourished.  HENT:  Head: Normocephalic and atraumatic.  Eyes: Pupils are equal, round, and reactive to light.  Neck: Normal range of motion.  Cardiovascular: Normal rate and regular rhythm.   Respiratory: Effort normal.  GI: Soft.  Musculoskeletal:  Right heel is very tender over the Achilles insertion. She also has some pain through the retrocalcaneal bursa area. Her foot is fairly flat.  Opposite heel is also somewhat tender. Her elbow is nontender at this point with good motion. She has intact sensation and motor function in both feet. I don't get any plantar fascial pain. There is no palpable lymphadenopathy behind either knee.   Neurological: She is alert and oriented to person, place, and time.  Skin: Skin is warm and dry.  Psychiatric:  She has a normal mood and affect. Her behavior is normal. Judgment and thought content normal.     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 Dr. Renae FicklePaul has treated her with injections and inserts in the past. The pain is both at the Achilles insertion and through the retrocalcaneal region. I don't get any pain to the plantar fascial origin. She has been unable to wear shoes for more than a year. I reviewed risk of anesthesia, infection, and wound healing problems  related to a pump bump and Achilles insertional spur excision with Achilles repair. She would have to keep her weight off this side for 2-4 weeks. 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 She is certainly of elevated surgical risk but she is accepting of this risk and at this point I don't see much of an alternative.  Kiela Shisler, Ginger OrganNDREW PAUL, PA-C 07/23/2015, 1:39 PM

## 2015-07-29 ENCOUNTER — Ambulatory Visit (INDEPENDENT_AMBULATORY_CARE_PROVIDER_SITE_OTHER): Payer: Medicaid Other | Admitting: Pharmacist

## 2015-07-29 ENCOUNTER — Encounter: Payer: Self-pay | Admitting: Pharmacist

## 2015-07-29 VITALS — BP 156/102 | Wt 399.6 lb

## 2015-07-29 DIAGNOSIS — I10 Essential (primary) hypertension: Secondary | ICD-10-CM

## 2015-07-29 MED ORDER — AMLODIPINE BESYLATE 10 MG PO TABS: 10.0000 mg | ORAL_TABLET | Freq: Every day | ORAL | Status: DC

## 2015-07-29 MED ORDER — LISINOPRIL 40 MG PO TABS: 40.0000 mg | ORAL_TABLET | Freq: Every day | ORAL | Status: DC

## 2015-07-29 NOTE — Patient Instructions (Addendum)
Return for a a follow up appointment in 3 weeks.   Your blood pressure today is 156/102.  Check your blood pressure at home daily (if able) and keep record of the readings.  Take your BP meds as follows: Start taking amlodipine 10mg  in the evening (take 2 tablets of your current supply until you run out then pick up higher strength) Take lisinopril 40mg  once daily in the morning (take 2 tablets of your current supply until you run out then pick up higher strength)    Bring all of your meds, your BP cuff and your record of home blood pressures to your next appointment.  Exercise as you're able, try to walk approximately 30 minutes per day.  Keep salt intake to a minimum, especially watch canned and prepared boxed foods.  Eat more fresh fruits and vegetables and fewer canned items.  Avoid eating in fast food restaurants.    HOW TO TAKE YOUR BLOOD PRESSURE: . Rest 5 minutes before taking your blood pressure. .  Don't smoke or drink caffeinated beverages for at least 30 minutes before. . Take your blood pressure before (not after) you eat. . Sit comfortably with your back supported and both feet on the floor (don't cross your legs). . Elevate your arm to heart level on a table or a desk. . Use the proper sized cuff. It should fit smoothly and snugly around your bare upper arm. There should be enough room to slip a fingertip under the cuff. The bottom edge of the cuff should be 1 inch above the crease of the elbow. . Ideally, take 3 measurements at one sitting and record the average.

## 2015-07-29 NOTE — Progress Notes (Signed)
Patient ID: Melanie Hess                 DOB: 24-Sep-1970                      MRN: [id number]     HPI: Melanie Hess is a 45 y.o. female patient of Dr. [name] presenting for blood pressure evaluation. At her visit about a week ago Dr. [name] increased her lisinopril from 2.5mg  to  and added amlodipine . She reports tolerating these well. She is concerned that if her blood pressure remains elevated they will not perform her procedure (foot surgery) which is scheduled for Friday.   She reports she is also doing a sleep   She reports she no longer takes furosemide because it caused her to wet herself at work.   She also reports that her albuterol use has decreased after the addition of Breo, but she does still use it more commonly in the mornings. She also reports that she will finish her prednisone taper later this week as well.   Cardiac Hx: HTN, DM, normal LV function, morbid obesity  Current HTN meds:  Amlodipine  in the morning Lisinopril  in the morning  BP goal: <140/90 - would ultimately like to get her closer to 120/80   Family History: Her mother has HTN, a heart murmur and maybe asthma. Her sister also suffers from diabetes.   Social History: She has not had a cigarette in about 10 years and even then a pack would last her about 1 year. She denies smokeless tobacco and reports she has not had alcohol in about 2 months because she is so nervous about her diabetes.   Diet: She reports that she does not eat that much. She eats 2 meals a day. When she is working she eats out eating fried chicken and wings. When she eats at home she eats cereal, chicken, and broccoli. She states that she love cheese and most of her meals include cheese. She does add salt at the table, but has decreased this since she is nervous about her health.   She reports high caffeine intake, drinking 3 - 6 liters of coke/pepsi/mt. Dew per day.  She has recently been trying to replace  this with unsweetened green tea and gatorade.   We had a lengthy discussion on decreasing soda intake to help with blood pressure as well as blood sugar.   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 She has also been limited due to her ankles, one of which will be operated on Friday with plans to operate on the other in several weeks.   Home BP readings: She does not have a home cuff.   Wt Readings from Last 3 Encounters:  07/23/15 400 lb (181.439 kg)  07/21/15 399 lb 9.6 oz (181.257 kg)  06/03/14 367 lb (166.47 kg)   BP Readings from Last 3 Encounters:  07/23/15 142/86  07/21/15 164/112  05/29/15 196/106   Pulse Readings from Last 3 Encounters:  07/23/15 79  07/21/15 70  05/29/15 75    Renal function: CrCl cannot be calculated (Patient has no serum creatinine result on file.).  Past Medical History  Diagnosis Date  . Migraine   . Asthma   . Ankle fracture, right 2001  . Plantar fasciitis of right foot   .  Hypertension   . OSA (obstructive sleep apnea)   . Arthritis     Current Outpatient Prescriptions on File Prior to Visit  Medication Sig Dispense Refill  . acetaminophen (TYLENOL) 500 MG tablet Take 1,000-1,500 mg by mouth every 6 (six) hours as needed for moderate pain or headache.    Marland Kitchen. acetaminophen (TYLENOL) 500 MG tablet Take 2 tablets (1,000 mg total) by mouth every 6 (six) hours as needed. 30 tablet 0  . albuterol (PROVENTIL) (2.5 MG/3ML) 0.083% nebulizer solution Take 2.5 mg by nebulization 2 (two) times daily as needed for wheezing or shortness of breath.     Marland Kitchen. amLODipine (NORVASC) 5 MG tablet Take 1 tablet (5 mg total) by mouth daily. 180 tablet 3  . fluticasone furoate-vilanterol (BREO ELLIPTA) 100-25 MCG/INH AEPB Inhale 1 puff into the lungs daily. 60 each 6  . furosemide (LASIX) 20 MG tablet Take 1 tablet (20 mg total) by mouth daily. 30 tablet 0    . lisinopril (PRINIVIL,ZESTRIL) 20 MG tablet Take 1 tablet (20 mg total) by mouth daily. 90 tablet 3  . meloxicam (MOBIC) 15 MG tablet Take 15 mg by mouth daily.  0  . metFORMIN (GLUCOPHAGE-XR) 500 MG 24 hr tablet Take 1,000 mg by mouth 2 (two) times daily.  3  . methocarbamol (ROBAXIN) 500 MG tablet Take 1 tablet (500 mg total) by mouth every 8 (eight) hours as needed for muscle spasms. 20 tablet 0  . predniSONE (DELTASONE) 10 MG tablet Take 4 tabs po x 2 days, then 3 x 2 days, then 2 x 2 days, then 1 x 2 days then stop. 20 tablet 0  . predniSONE (DELTASONE) 20 MG tablet Take 2 tablets (40 mg total) by mouth daily. 6 tablet 0   No current facility-administered medications on file prior to visit.    Allergies  Allergen Reactions  . Aspirin Shortness Of Breath and Swelling  . Nsaids Shortness Of Breath and Swelling  . Meloxicam Other (See Comments)    Told not to take med  . Penicillins Swelling    Has patient had a PCN reaction causing immediate rash, facial/tongue/throat swelling, SOB or lightheadedness with hypotension: Yes Has patient had a PCN reaction causing severe rash involving mucus membranes or skin necrosis: Yes Has patient had a PCN reaction that required hospitalization No Has patient had a PCN reaction occurring within the last 10 years: No If all of the above answers are "NO", then may proceed with Cephalosporin use.   Marland Kitchen. Percocet [Oxycodone-Acetaminophen] Hives and Itching  . Tramadol Nausea Only  . Vicodin [Hydrocodone-Acetaminophen] Hives and Itching     Assessment/Plan: Hypertension: BP is not at goal and only slightly improved from her previous visit. Will proceed with aggressive treatment since we are attempting to have her blood pressure controlled for a procedure on Friday. Will have her increase lisinopril to 40mg  daily. Will have her also increase her amlodipine to 10mg  daily and take this in the evening for added control. Have educated her on signs/symptoms of  low blood pressure and instructed her to call the clinic if she experiences these symptoms. Will have her follow up in 3 weeks.    Thank you, Freddie ApleyKelley M. Cleatis PolkaAuten, PharmD  [facility]  07/29/2015 10:15 AM

## 2015-07-30 ENCOUNTER — Telehealth: Payer: Self-pay | Admitting: Pharmacist

## 2015-07-30 NOTE — Telephone Encounter (Signed)
Thanks for letting me know.  Dr. H 

## 2015-07-30 NOTE — Telephone Encounter (Signed)
Patient presents today to discuss a note to keep her out of work 2 days until her surgery. She is wanting to remain home from work to help with her blood pressure. Informed her that unfortunately I was unable to give her a medical note. Instructed her to contact her surgeon about a note. I also stated I would route message to Dr. Rennis GoldenHilty so he was aware.   She also states she takes another medication for blood pressure but she is unsure the name of the medication. Her primary care provider has been prescribing this. Have instructed her to call the clinic with the name of the medication when she gets home.

## 2015-07-31 ENCOUNTER — Encounter (HOSPITAL_BASED_OUTPATIENT_CLINIC_OR_DEPARTMENT_OTHER): Payer: Self-pay | Admitting: *Deleted

## 2015-07-31 ENCOUNTER — Telehealth: Payer: Self-pay | Admitting: *Deleted

## 2015-07-31 MED ORDER — VANCOMYCIN HCL 10 G IV SOLR
1500.0000 mg | INTRAVENOUS | Status: AC
  Administered 2015-08-01: 1500 mg via INTRAVENOUS
  Filled 2015-07-31: qty 1500

## 2015-07-31 NOTE — Progress Notes (Signed)
Anesthesia Chart Review:  Pt is a 45 year old female scheduled for R heel spur excisions on 08/01/2015 with Marcene CorningPeter Dalldorf, MD.   PCP is Leilani AbleBetti Reese, MD. Cardiologist is Zoila ShutterKenneth Hilty, MD. Pulmonologist is Kalman ShanMurali Ramaswamy, MD who is aware of upcoming surgery.   PMH includes:  HTN, DM, OSA, asthma, GERD. Never smoker. BMI 64.5  Medications include: albuterol, amlodipine, breo ellipta, lisinopril, metformin, olmesartan, prednisone.   Labs will be obtained DOS.   Chest x-ray 10/18/14 reviewed. No active disease in chest.   EKG 07/21/15: NSR.   Echo 03/26/14:  - Left ventricle: There is a false tendon at the LV apex. The cavity size was normal. There was mild concentric hypertrophy. Systolic function was normal. The estimated ejection fraction was in the range of 55% to 60%. Wall motion was normal; there were noregional wall motion abnormalities. - Mitral valve: There was trivial regurgitation.  If labs acceptable DOS, I anticipate pt can proceed as scheduled.   Melanie Mastngela Altheia Shafran, FNP-BC [facility]Anesthesiology Phone: [phone] 07/31/2015 10:43 AM

## 2015-07-31 NOTE — Telephone Encounter (Signed)
Called [place] [phone] ID # [id number] L  Called initiated PA over the phone. This will go into clinical review PA # [id number] [facility]

## 2015-07-31 NOTE — Progress Notes (Signed)
Melanie Hess reports that diastolic blood pressure was 102 on 7/11 and 7/12.  Patient states that she is really watching what she eats and taking medications as instructed. Patient states that she gets short of breath when walking a short distance. Patient has a nebulizer to use prn. States she used it Monday. Melanie Diaz-Hess reports that she has had "tightness in her chest with anxiety, "I told my heart doctor and he said my heart is good."  Patient reports that tightness is relieved when she removes herself from the stressful situation. Patient denies lightheadedness, nausea, arm pain or diaphoresis with these episodes.  Melanie Hess states that CBG's have been in the low 100's. 'Fasting CBG tody was 102'. ( patient is completing Prednisone decreasing doseages today.) I instructed patient to check CBG to check CBG and if it is less than 70 to treat it with Glucose Gel, Glucose tablets or 1/2 cup of clear juice like apple juice or cranberry juice, or 1/2 cup of regular soda. (not cream soda). I instructed patient to recheck CBG in 15 minutes and if CBG is not greater than 70, to  Call [phone] (pre- op). If it is before pre-op opens to retreat as before and recheck CBG in 15 minutes. I told patient to make note of time that liquid is taken and amount, that surgical time may have to be adjusted.

## 2015-08-01 ENCOUNTER — Ambulatory Visit (HOSPITAL_COMMUNITY): Payer: Medicaid Other | Admitting: Emergency Medicine

## 2015-08-01 ENCOUNTER — Encounter (HOSPITAL_COMMUNITY)
Admission: RE | Disposition: A | Discharge status: Home / Self Care | Payer: Self-pay | Source: Ambulatory Visit | Attending: Orthopaedic Surgery

## 2015-08-01 ENCOUNTER — Encounter (HOSPITAL_COMMUNITY): Payer: Self-pay | Admitting: Surgery

## 2015-08-01 ENCOUNTER — Observation Stay (HOSPITAL_BASED_OUTPATIENT_CLINIC_OR_DEPARTMENT_OTHER)
Admission: RE | Admit: 2015-08-01 | Discharge: 2015-08-02 | Disposition: A | Discharge status: Home / Self Care | Payer: Medicaid Other | Source: Ambulatory Visit | Attending: Orthopaedic Surgery | Admitting: Orthopaedic Surgery

## 2015-08-01 DIAGNOSIS — E119 Type 2 diabetes mellitus without complications: Secondary | ICD-10-CM | POA: Diagnosis not present

## 2015-08-01 DIAGNOSIS — Z7951 Long term (current) use of inhaled steroids: Secondary | ICD-10-CM | POA: Diagnosis not present

## 2015-08-01 DIAGNOSIS — I10 Essential (primary) hypertension: Secondary | ICD-10-CM | POA: Insufficient documentation

## 2015-08-01 DIAGNOSIS — J45909 Unspecified asthma, uncomplicated: Secondary | ICD-10-CM | POA: Insufficient documentation

## 2015-08-01 DIAGNOSIS — G4733 Obstructive sleep apnea (adult) (pediatric): Secondary | ICD-10-CM | POA: Diagnosis not present

## 2015-08-01 DIAGNOSIS — M216X1 Other acquired deformities of right foot: Secondary | ICD-10-CM | POA: Diagnosis not present

## 2015-08-01 DIAGNOSIS — M7731 Calcaneal spur, right foot: Secondary | ICD-10-CM | POA: Diagnosis present

## 2015-08-01 DIAGNOSIS — Z6841 Body Mass Index (BMI) 40.0 and over, adult: Secondary | ICD-10-CM | POA: Diagnosis not present

## 2015-08-01 DIAGNOSIS — M79671 Pain in right foot: Secondary | ICD-10-CM

## 2015-08-01 DIAGNOSIS — F419 Anxiety disorder, unspecified: Secondary | ICD-10-CM | POA: Insufficient documentation

## 2015-08-01 DIAGNOSIS — Z79899 Other long term (current) drug therapy: Secondary | ICD-10-CM | POA: Diagnosis not present

## 2015-08-01 DIAGNOSIS — Z7984 Long term (current) use of oral hypoglycemic drugs: Secondary | ICD-10-CM | POA: Diagnosis not present

## 2015-08-01 HISTORY — DX: Carpal tunnel syndrome, unspecified upper limb: G56.00

## 2015-08-01 HISTORY — DX: Type 2 diabetes mellitus without complications: E11.9

## 2015-08-01 HISTORY — DX: Anxiety disorder, unspecified: F41.9

## 2015-08-01 HISTORY — DX: Reserved for inherently not codable concepts without codable children: IMO0001

## 2015-08-01 HISTORY — DX: Gastro-esophageal reflux disease without esophagitis: K21.9

## 2015-08-01 HISTORY — PX: HEEL SPUR RESECTION: SHX6410

## 2015-08-01 LAB — BASIC METABOLIC PANEL
Anion gap: 8 (ref 5–15)
BUN: 12 mg/dL (ref 6–20)
CHLORIDE: 106 mmol/L (ref 101–111)
CO2: 25 mmol/L (ref 22–32)
CREATININE: 0.87 mg/dL (ref 0.44–1.00)
Calcium: 8.6 mg/dL — ABNORMAL LOW (ref 8.9–10.3)
GFR calc non Af Amer: >60 mL/min (ref >60)
GLUCOSE: 93 mg/dL (ref 65–99)
Potassium: 3.8 mmol/L (ref 3.5–5.1)
Sodium: 139 mmol/L (ref 135–145)

## 2015-08-01 LAB — CBC
HCT: 37.9 % (ref 36.0–46.0)
Hemoglobin: 12.2 g/dL (ref 12.0–15.0)
MCH: 27.9 pg (ref 26.0–34.0)
MCHC: 32.2 g/dL (ref 30.0–36.0)
MCV: 86.5 fL (ref 78.0–100.0)
PLATELETS: 282 10*3/uL (ref 150–400)
RBC: 4.38 MIL/uL (ref 3.87–5.11)
RDW: 15.4 % (ref 11.5–15.5)
WBC: 8.4 10*3/uL (ref 4.0–10.5)

## 2015-08-01 LAB — GLUCOSE, CAPILLARY
GLUCOSE-CAPILLARY: 103 mg/dL — AB (ref 65–99)
Glucose-Capillary: 84 mg/dL (ref 65–99)
Glucose-Capillary: 105 mg/dL — ABNORMAL HIGH (ref 65–99)

## 2015-08-01 LAB — HCG, SERUM, QUALITATIVE: Preg, Serum: NEGATIVE

## 2015-08-01 SURGERY — EXCISION, BONE SPUR, CALCANEUS
Anesthesia: General | Laterality: Right

## 2015-08-01 MED ORDER — ACETAMINOPHEN 325 MG PO TABS: 650.0000 mg | ORAL_TABLET | Freq: Four times a day (QID) | ORAL | Status: DC | PRN

## 2015-08-01 MED ORDER — ACETAMINOPHEN 650 MG RE SUPP: 650.0000 mg | Freq: Four times a day (QID) | RECTAL | Status: DC | PRN

## 2015-08-01 MED ORDER — METFORMIN HCL ER 500 MG PO TB24
1000.0000 mg | ORAL_TABLET | Freq: Two times a day (BID) | ORAL | Status: DC
  Filled 2015-08-01 (×2): qty 2

## 2015-08-01 MED ORDER — FLUTICASONE FUROATE-VILANTEROL 100-25 MCG/INH IN AEPB
1.0000 | INHALATION_SPRAY | RESPIRATORY_TRACT | Status: DC
  Administered 2015-08-02: 1 via RESPIRATORY_TRACT
  Filled 2015-08-01: qty 28

## 2015-08-01 MED ORDER — PROPOFOL 10 MG/ML IV BOLUS
INTRAVENOUS | Status: AC
  Filled 2015-08-01: qty 40

## 2015-08-01 MED ORDER — HYDROMORPHONE HCL 1 MG/ML IJ SOLN
0.5000 mg | INTRAMUSCULAR | Status: DC | PRN
  Administered 2015-08-01 – 2015-08-02 (×3): 1 mg via INTRAVENOUS
  Filled 2015-08-01 (×3): qty 1

## 2015-08-01 MED ORDER — ONDANSETRON HCL 4 MG PO TABS: 4.0000 mg | ORAL_TABLET | Freq: Four times a day (QID) | ORAL | Status: DC | PRN

## 2015-08-01 MED ORDER — MIDAZOLAM HCL 2 MG/2ML IJ SOLN
INTRAMUSCULAR | Status: AC
  Filled 2015-08-01: qty 2

## 2015-08-01 MED ORDER — FENTANYL CITRATE (PF) 250 MCG/5ML IJ SOLN
INTRAMUSCULAR | Status: AC
  Filled 2015-08-01: qty 5

## 2015-08-01 MED ORDER — TRAMADOL HCL 50 MG PO TABS
50.0000 mg | ORAL_TABLET | Freq: Four times a day (QID) | ORAL | Status: DC
Start: 2015-08-01 — End: 2015-08-02
  Administered 2015-08-01: 100 mg via ORAL
  Filled 2015-08-01 (×2): qty 2

## 2015-08-01 MED ORDER — FENTANYL CITRATE (PF) 100 MCG/2ML IJ SOLN
INTRAMUSCULAR | Status: AC
  Filled 2015-08-01: qty 2

## 2015-08-01 MED ORDER — PHENYLEPHRINE HCL 10 MG/ML IJ SOLN
INTRAMUSCULAR | Status: DC | PRN
  Administered 2015-08-01 (×2): 80 ug via INTRAVENOUS
  Administered 2015-08-01 (×2): 120 ug via INTRAVENOUS

## 2015-08-01 MED ORDER — FENTANYL CITRATE (PF) 100 MCG/2ML IJ SOLN
INTRAMUSCULAR | Status: DC | PRN
  Administered 2015-08-01 (×2): 100 ug via INTRAVENOUS

## 2015-08-01 MED ORDER — ONDANSETRON HCL 4 MG/2ML IJ SOLN
INTRAMUSCULAR | Status: DC | PRN
  Administered 2015-08-01: 4 mg via INTRAVENOUS

## 2015-08-01 MED ORDER — LIDOCAINE 2% (20 MG/ML) 5 ML SYRINGE
INTRAMUSCULAR | Status: AC
  Filled 2015-08-01: qty 5

## 2015-08-01 MED ORDER — LACTATED RINGERS IV SOLN
INTRAVENOUS | Status: DC
  Administered 2015-08-01: 11:00:00 via INTRAVENOUS

## 2015-08-01 MED ORDER — CHLORHEXIDINE GLUCONATE 4 % EX LIQD: 60.0000 mL | Freq: Once | CUTANEOUS | Status: DC

## 2015-08-01 MED ORDER — FENTANYL CITRATE (PF) 100 MCG/2ML IJ SOLN
25.0000 ug | INTRAMUSCULAR | Status: DC | PRN
  Administered 2015-08-01: 25 ug via INTRAVENOUS
  Administered 2015-08-01: 50 ug via INTRAVENOUS

## 2015-08-01 MED ORDER — DEXAMETHASONE SODIUM PHOSPHATE 10 MG/ML IJ SOLN
INTRAMUSCULAR | Status: AC
  Filled 2015-08-01: qty 1

## 2015-08-01 MED ORDER — ROCURONIUM BROMIDE 50 MG/5ML IV SOLN
INTRAVENOUS | Status: AC
  Filled 2015-08-01: qty 1

## 2015-08-01 MED ORDER — METHOCARBAMOL 500 MG PO TABS
500.0000 mg | ORAL_TABLET | Freq: Four times a day (QID) | ORAL | Status: DC | PRN
Start: 2015-08-01 — End: 2015-08-02
  Administered 2015-08-01: 500 mg via ORAL
  Filled 2015-08-01: qty 1

## 2015-08-01 MED ORDER — DIPHENHYDRAMINE HCL 12.5 MG/5ML PO ELIX: 12.5000 mg | ORAL_SOLUTION | ORAL | Status: DC | PRN

## 2015-08-01 MED ORDER — LIDOCAINE HCL (CARDIAC) 20 MG/ML IV SOLN
INTRAVENOUS | Status: DC | PRN
  Administered 2015-08-01: 100 mg via INTRAVENOUS

## 2015-08-01 MED ORDER — SUCCINYLCHOLINE CHLORIDE 20 MG/ML IJ SOLN
INTRAMUSCULAR | Status: DC | PRN
  Administered 2015-08-01: 100 mg via INTRAVENOUS

## 2015-08-01 MED ORDER — DEXTROSE 5 % IV SOLN
500.0000 mg | Freq: Four times a day (QID) | INTRAVENOUS | Status: DC | PRN
  Filled 2015-08-01: qty 5

## 2015-08-01 MED ORDER — MIDAZOLAM HCL 5 MG/5ML IJ SOLN
INTRAMUSCULAR | Status: DC | PRN
  Administered 2015-08-01: 2 mg via INTRAVENOUS

## 2015-08-01 MED ORDER — LISINOPRIL 40 MG PO TABS
40.0000 mg | ORAL_TABLET | Freq: Every day | ORAL | Status: DC
  Filled 2015-08-01: qty 1

## 2015-08-01 MED ORDER — ONDANSETRON HCL 4 MG/2ML IJ SOLN
INTRAMUSCULAR | Status: AC
  Filled 2015-08-01: qty 2

## 2015-08-01 MED ORDER — PHENYLEPHRINE 40 MCG/ML (10ML) SYRINGE FOR IV PUSH (FOR BLOOD PRESSURE SUPPORT)
PREFILLED_SYRINGE | INTRAVENOUS | Status: AC
  Filled 2015-08-01: qty 10

## 2015-08-01 MED ORDER — AMLODIPINE BESYLATE 10 MG PO TABS
10.0000 mg | ORAL_TABLET | Freq: Every day | ORAL | Status: DC
  Administered 2015-08-01: 10 mg via ORAL
  Filled 2015-08-01 (×2): qty 1

## 2015-08-01 MED ORDER — EPHEDRINE 5 MG/ML INJ
INTRAVENOUS | Status: AC
  Filled 2015-08-01: qty 20

## 2015-08-01 MED ORDER — METOCLOPRAMIDE HCL 5 MG/ML IJ SOLN
5.0000 mg | Freq: Three times a day (TID) | INTRAMUSCULAR | Status: DC | PRN
Start: 2015-08-01 — End: 2015-08-02
  Administered 2015-08-01 – 2015-08-02 (×2): 10 mg via INTRAVENOUS
  Filled 2015-08-01 (×2): qty 2

## 2015-08-01 MED ORDER — ALBUTEROL SULFATE (2.5 MG/3ML) 0.083% IN NEBU
2.5000 mg | INHALATION_SOLUTION | Freq: Two times a day (BID) | RESPIRATORY_TRACT | Status: DC | PRN
  Administered 2015-08-01 – 2015-08-02 (×2): 2.5 mg via RESPIRATORY_TRACT
  Filled 2015-08-01 (×2): qty 3

## 2015-08-01 MED ORDER — DEXAMETHASONE SODIUM PHOSPHATE 10 MG/ML IJ SOLN
INTRAMUSCULAR | Status: DC | PRN
  Administered 2015-08-01: 5 mg via INTRAVENOUS

## 2015-08-01 MED ORDER — METOCLOPRAMIDE HCL 5 MG PO TABS
5.0000 mg | ORAL_TABLET | Freq: Three times a day (TID) | ORAL | Status: DC | PRN
  Administered 2015-08-02: 10 mg via ORAL
  Filled 2015-08-01: qty 2

## 2015-08-01 MED ORDER — ONDANSETRON HCL 4 MG/2ML IJ SOLN
4.0000 mg | Freq: Four times a day (QID) | INTRAMUSCULAR | Status: DC | PRN
  Administered 2015-08-01 – 2015-08-02 (×2): 4 mg via INTRAVENOUS
  Filled 2015-08-01: qty 2

## 2015-08-01 MED ORDER — PROPOFOL 10 MG/ML IV BOLUS
INTRAVENOUS | Status: DC | PRN
  Administered 2015-08-01: 200 mg via INTRAVENOUS

## 2015-08-01 MED ORDER — EPHEDRINE SULFATE 50 MG/ML IJ SOLN
INTRAMUSCULAR | Status: DC | PRN
  Administered 2015-08-01: 15 mg via INTRAVENOUS
  Administered 2015-08-01: 5 mg via INTRAVENOUS
  Administered 2015-08-01: 20 mg via INTRAVENOUS
  Administered 2015-08-01 (×3): 10 mg via INTRAVENOUS
  Administered 2015-08-01: 5 mg via INTRAVENOUS

## 2015-08-01 MED ORDER — LACTATED RINGERS IV SOLN
INTRAVENOUS | Status: DC
  Administered 2015-08-01: 18:00:00 via INTRAVENOUS

## 2015-08-01 MED ORDER — SODIUM CHLORIDE 0.9 % IR SOLN
Status: DC | PRN
  Administered 2015-08-01: 1000 mL

## 2015-08-01 MED ORDER — BUPIVACAINE HCL (PF) 0.25 % IJ SOLN
INTRAMUSCULAR | Status: DC | PRN
  Administered 2015-08-01: 10 mL

## 2015-08-01 MED ORDER — BUPIVACAINE HCL (PF) 0.25 % IJ SOLN
INTRAMUSCULAR | Status: AC
  Filled 2015-08-01: qty 30

## 2015-08-01 SURGICAL SUPPLY — 52 items
ANCH SUT 2 2.9 2 LD TPR NDL (Anchor) ×2 IMPLANT
ANCH SUT KNTLS SLF PNCH MTL (Anchor) ×2 IMPLANT
ANCHOR JUGGERKNOT WTAP NDL 2.9 (Anchor) ×4 IMPLANT
BANDAGE ELASTIC 4 VELCRO ST LF (GAUZE/BANDAGES/DRESSINGS) ×3 IMPLANT
BANDAGE ELASTIC 6 VELCRO ST LF (GAUZE/BANDAGES/DRESSINGS) ×3 IMPLANT
BIT DRILL JUGRKNT W/NDL BIT2.9 (DRILL) IMPLANT
BLADE SURG 10 STRL SS (BLADE) ×3 IMPLANT
BNDG COHESIVE 4X5 TAN STRL (GAUZE/BANDAGES/DRESSINGS) ×3 IMPLANT
BNDG GAUZE ELAST 4 BULKY (GAUZE/BANDAGES/DRESSINGS) ×3 IMPLANT
COVER SURGICAL LIGHT HANDLE (MISCELLANEOUS) ×3 IMPLANT
CUFF TOURNIQUET SINGLE 34IN LL (TOURNIQUET CUFF) IMPLANT
CUFF TOURNIQUET SINGLE 44IN (TOURNIQUET CUFF) IMPLANT
DRILL JUGGERKNOT W/NDL BIT 2.9 (DRILL) ×3
DURAPREP 26ML APPLICATOR (WOUND CARE) ×3 IMPLANT
ELECT REM PT RETURN 9FT ADLT (ELECTROSURGICAL)
ELECTRODE REM PT RTRN 9FT ADLT (ELECTROSURGICAL) IMPLANT
EVACUATOR 1/8 PVC DRAIN (DRAIN) IMPLANT
FACESHIELD WRAPAROUND (MASK) ×6 IMPLANT
FACESHIELD WRAPAROUND OR TEAM (MASK) ×2 IMPLANT
GAUZE SPONGE 4X4 12PLY STRL (GAUZE/BANDAGES/DRESSINGS) ×3 IMPLANT
GAUZE XEROFORM 1X8 LF (GAUZE/BANDAGES/DRESSINGS) ×3 IMPLANT
GLOVE BIO SURGEON STRL SZ8 (GLOVE) ×12 IMPLANT
GOWN STRL REUS W/ TWL LRG LVL3 (GOWN DISPOSABLE) ×3 IMPLANT
GOWN STRL REUS W/TWL 2XL LVL3 (GOWN DISPOSABLE) ×3 IMPLANT
GOWN STRL REUS W/TWL LRG LVL3 (GOWN DISPOSABLE) ×9
HANDPIECE INTERPULSE COAX TIP (DISPOSABLE)
IMPL ANCHOR PEEK 4.5MM (Anchor) IMPLANT
IMPLANT ANCHOR PEEK 4.5MM (Anchor) ×6 IMPLANT
KIT BASIN OR (CUSTOM PROCEDURE TRAY) ×3 IMPLANT
KIT ROOM TURNOVER OR (KITS) ×3 IMPLANT
MANIFOLD NEPTUNE II (INSTRUMENTS) ×3 IMPLANT
NS IRRIG 1000ML POUR BTL (IV SOLUTION) ×3 IMPLANT
PACK ORTHO EXTREMITY (CUSTOM PROCEDURE TRAY) ×3 IMPLANT
PAD ARMBOARD 7.5X6 YLW CONV (MISCELLANEOUS) ×6 IMPLANT
PENCIL BUTTON HOLSTER BLD 10FT (ELECTRODE) IMPLANT
SET HNDPC FAN SPRY TIP SCT (DISPOSABLE) IMPLANT
SPONGE LAP 18X18 X RAY DECT (DISPOSABLE) ×3 IMPLANT
STOCKINETTE IMPERVIOUS 9X36 MD (GAUZE/BANDAGES/DRESSINGS) ×3 IMPLANT
SUT ETHILON 3 0 PS 1 (SUTURE) IMPLANT
SUT MNCRL AB 3-0 PS2 18 (SUTURE) ×2 IMPLANT
SUT VIC AB 0 CT1 27 (SUTURE) ×3
SUT VIC AB 0 CT1 27XBRD ANBCTR (SUTURE) IMPLANT
SUT VIC AB 2-0 CT1 27 (SUTURE) ×3
SUT VIC AB 2-0 CT1 TAPERPNT 27 (SUTURE) IMPLANT
TOWEL OR 17X24 6PK STRL BLUE (TOWEL DISPOSABLE) ×3 IMPLANT
TOWEL OR 17X26 10 PK STRL BLUE (TOWEL DISPOSABLE) ×3 IMPLANT
TUBE ANAEROBIC SPECIMEN COL (MISCELLANEOUS) IMPLANT
TUBE CONNECTING 12'X1/4 (SUCTIONS) ×1
TUBE CONNECTING 12X1/4 (SUCTIONS) ×2 IMPLANT
UNDERPAD 30X30 INCONTINENT (UNDERPADS AND DIAPERS) ×3 IMPLANT
WATER STERILE IRR 1000ML POUR (IV SOLUTION) ×1 IMPLANT
YANKAUER SUCT BULB TIP NO VENT (SUCTIONS) ×3 IMPLANT

## 2015-08-01 NOTE — Anesthesia Postprocedure Evaluation (Signed)
Anesthesia Post Note  Patient: Melanie Hess  Procedure(s) Performed: Procedure(s) (LRB): HEEL SPUR EXCISIONS (Right)  Patient location during evaluation: PACU Anesthesia Type: General Level of consciousness: awake, awake and alert and oriented Pain management: pain level controlled Vital Signs Assessment: post-procedure vital signs reviewed and stable Respiratory status: spontaneous breathing, nonlabored ventilation and respiratory function stable Cardiovascular status: blood pressure returned to baseline Anesthetic complications: no    Last Vitals:  Filed Vitals:   08/01/15 1700 08/01/15 1722  BP: 116/60 140/84  Pulse: 65 68  Temp: 36.1 C   Resp: 16 17    Last Pain:  Filed Vitals:   08/01/15 1732  PainSc: Asleep                 Zahrah Sutherlin COKER

## 2015-08-01 NOTE — Anesthesia Preprocedure Evaluation (Signed)
Anesthesia Evaluation  Patient identified by MRN, date of birth, ID band Patient awake    Reviewed: Allergy & Precautions, NPO status , Patient's Chart, lab work & pertinent test results  History of Anesthesia Complications Negative for: history of anesthetic complications  Airway Mallampati: III  TM Distance: >3 FB Neck ROM: Full    Dental  (+) Teeth Intact, Missing,    Pulmonary shortness of breath, asthma , sleep apnea ,    breath sounds clear to auscultation       Cardiovascular hypertension, Pt. on medications (-) angina+ DOE  (-) Past MI and (-) CHF  Rhythm:Regular     Neuro/Psych  Headaches, Anxiety  Neuromuscular disease    GI/Hepatic Neg liver ROS, GERD  Controlled,  Endo/Other  diabetes, Type 2, Oral Hypoglycemic AgentsMorbid obesity  Renal/GU negative Renal ROS     Musculoskeletal  (+) Arthritis ,   Abdominal   Peds  Hematology   Anesthesia Other Findings   Reproductive/Obstetrics                             Anesthesia Physical Anesthesia Plan  ASA: III  Anesthesia Plan: General   Post-op Pain Management:    Induction: Intravenous  Airway Management Planned: Oral ETT  Additional Equipment: None  Intra-op Plan:   Post-operative Plan: Extubation in OR  Informed Consent: I have reviewed the patients History and Physical, chart, labs and discussed the procedure including the risks, benefits and alternatives for the proposed anesthesia with the patient or authorized representative who has indicated his/her understanding and acceptance.   Dental advisory given  Plan Discussed with: CRNA and Surgeon  Anesthesia Plan Comments:         Anesthesia Quick Evaluation

## 2015-08-01 NOTE — Transfer of Care (Signed)
Immediate Anesthesia Transfer of Care Note  Patient: Melanie Hess  Procedure(s) Performed: Procedure(s): HEEL SPUR EXCISIONS (Right)  Patient Location: PACU  Anesthesia Type:General  Level of Consciousness: awake  Airway & Oxygen Therapy: Patient Spontanous Breathing and Patient connected to face mask oxygen  Post-op Assessment: Report given to RN and Post -op Vital signs reviewed and stable  Post vital signs: Reviewed and stable  Last Vitals:  Filed Vitals:   08/01/15 1055  BP: 179/92  Pulse: 67  Temp: 37.2 C  Resp: 18    Last Pain: There were no vitals filed for this visit.    Patients Stated Pain Goal: 1 (08/01/15 1055)  Complications: No apparent anesthesia complications

## 2015-08-01 NOTE — Interval H&P Note (Signed)
History and Physical Interval Note:  08/01/2015 1:14 PM  Melanie Hess  has presented today for surgery, with the diagnosis of RIGHT HEEL INSERTIONAL SPUR AND PUMP BUMP  The various methods of treatment have been discussed with the patient and family. After consideration of risks, benefits and other options for treatment, the patient has consented to  Procedure(s): HEEL SPUR EXCISIONS (Right) as a surgical intervention .  The patient's history has been reviewed, patient examined, no change in status, stable for surgery.  I have reviewed the patient's chart and labs.  Questions were answered to the patient's satisfaction.     Kandee Escalante G

## 2015-08-01 NOTE — Anesthesia Procedure Notes (Signed)
Procedure Name: Intubation Date/Time: 08/01/2015 2:24 PM Performed by: Army FossaPULLIAM, Megyn Leng DANE Pre-anesthesia Checklist: Patient identified, Emergency Drugs available, Suction available, Patient being monitored and Timeout performed Patient Re-evaluated:Patient Re-evaluated prior to inductionOxygen Delivery Method: Circle system utilized Preoxygenation: Pre-oxygenation with 100% oxygen Intubation Type: IV induction Ventilation: Mask ventilation without difficulty Laryngoscope Size: Mac and 4 Grade View: Grade I Tube type: Oral Tube size: 7.0 mm Number of attempts: 1 Airway Equipment and Method: Patient positioned with wedge pillow and Stylet Placement Confirmation: ETT inserted through vocal cords under direct vision,  positive ETCO2,  CO2 detector and breath sounds checked- equal and bilateral Secured at: 23 cm Tube secured with: Tape Dental Injury: Teeth and Oropharynx as per pre-operative assessment

## 2015-08-01 NOTE — Op Note (Signed)
[id number]

## 2015-08-02 DIAGNOSIS — M7731 Calcaneal spur, right foot: Secondary | ICD-10-CM | POA: Diagnosis not present

## 2015-08-02 MED ORDER — PROMETHAZINE HCL 12.5 MG PO TABS: 12.5000 mg | ORAL_TABLET | Freq: Four times a day (QID) | ORAL | Status: DC | PRN

## 2015-08-02 MED ORDER — METHOCARBAMOL 500 MG PO TABS: 500.0000 mg | ORAL_TABLET | Freq: Four times a day (QID) | ORAL | Status: DC | PRN

## 2015-08-02 MED ORDER — ACETAMINOPHEN-CODEINE #3 300-30 MG PO TABS: 1.0000 | ORAL_TABLET | Freq: Four times a day (QID) | ORAL | Status: DC | PRN

## 2015-08-02 NOTE — Discharge Summary (Signed)
Patient ID: Melanie Hess MRN: [id number] DOB/AGE: [birth year] 47 y.o.  Admit date: 08/01/2015 Discharge date: 08/02/2015  Admission Diagnoses:  Principal Problem:   Pain of right heel Active Problems:   Morbid obesity (HCC)   Diabetes mellitus type 2, controlled, without complications [facility])   Discharge Diagnoses:  Same  Past Medical History  Diagnosis Date  . Migraine   . Asthma   . Ankle fracture, right 2001  . Plantar fasciitis of right foot   . Hypertension   . OSA (obstructive sleep apnea)   . Arthritis   . Shortness of breath dyspnea     with walking short distances  . Diabetes mellitus without complication (HCC)     Type II  . Anxiety   . GERD (gastroesophageal reflux disease)   . Carpal tunnel syndrome     Bilateral    Surgeries: Procedure(s): HEEL SPUR EXCISIONS on 08/01/2015   Consultants:    Discharged Condition: Improved  Hospital Course: Melanie Hess is an 45 y.o. female who was admitted 08/01/2015 for operative treatment ofPain of right heel. Patient has severe unremitting pain that affects sleep, daily activities, and work/hobbies. After pre-op clearance the patient was taken to the operating room on 08/01/2015 and underwent  Procedure(s): HEEL SPUR EXCISIONS.    Patient was given perioperative antibiotics: Anti-infectives    Start     Dose/Rate Route Frequency Ordered Stop   08/01/15 1200  vancomycin (VANCOCIN) 1,500 mg in sodium chloride 0.9 % 500 mL IVPB     1,500 mg 250 mL/hr over 120 Minutes Intravenous To [facility]Stay Surgical 07/31/15 1351 08/01/15 1429       Patient was given sequential compression devices, early ambulation, and chemoprophylaxis to prevent DVT.  Patient benefited maximally from hospital stay and there were no complications.    Recent vital signs: Patient Vitals for the past 24 hrs:  BP Temp Temp src Pulse Resp SpO2 Height Weight  08/02/15 0426 124/65 mmHg 98.4 F (36.9 C) Oral 64 16 96 % - -  08/01/15 2339  (!) 113/57 mmHg 97.7 F (36.5 C) Oral 75 16 95 % - -  08/01/15 1940 132/69 mmHg 97.7 F (36.5 C) Oral 72 18 93 % - -  08/01/15 1722 140/84 mmHg - - 68 17 95 % - -  08/01/15 1700 116/60 mmHg 97 F (36.1 C) - 65 16 97 % - -  08/01/15 1630 (!) 97/55 mmHg - - 66 16 97 % - -  08/01/15 1600 (!) 105/59 mmHg - - 68 16 93 % - -  08/01/15 1545 115/69 mmHg - - 66 16 96 % - -  08/01/15 1530 102/68 mmHg 97.6 F (36.4 C) - 68 16 97 % - -  08/01/15 1055 (!) 179/92 mmHg 98.9 F (37.2 C) Oral 67 18 98 %  (1.676 m) (!) 181.257 kg (399 lb 9.6 oz)     Recent laboratory studies:  Recent Labs  08/01/15 1103  WBC 8.4  HGB 12.2  HCT 37.9  PLT 282  NA 139  K 3.8  CL 106  CO2 25  BUN 12  CREATININE 0.87  GLUCOSE 93  CALCIUM 8.6*     Discharge Medications:     Medication List    STOP taking these medications        meloxicam 15 MG tablet  Commonly known as:  MOBIC      TAKE these medications   SALOMA CADENA 500 MG tablet  Commonly known as:  TYLENOL  Take 2 tablets (1,000 mg total) by mouth every 6 (six) hours as needed.     acetaminophen-codeine 300-30 MG tablet  Commonly known as:  TYLENOL #3  Take 1-2 tablets by mouth every 6 (six) hours as needed for moderate pain.     albuterol (2.5 MG/3ML) 0.083% nebulizer solution  Commonly known as:  PROVENTIL  Take 2.5 mg by nebulization 2 (two) times daily as needed for wheezing or shortness of breath.     amLODipine 10 MG tablet  Commonly known as:  NORVASC  Take 1 tablet (10 mg total) by mouth daily.     fluticasone furoate-vilanterol 100-25 MCG/INH Aepb  Commonly known as:  BREO ELLIPTA  Inhale 1 puff into the lungs daily.     lisinopril 40 MG tablet  Commonly known as:  PRINIVIL,ZESTRIL  Take 1 tablet (40 mg total) by mouth daily.     metFORMIN 500 MG 24 hr tablet  Commonly known as:  GLUCOPHAGE-XR  Take 1,000 mg by mouth 2 (two) times daily.     methocarbamol 500 MG tablet  Commonly known as:  ROBAXIN  Take 1  tablet (500 mg total) by mouth every 6 (six) hours as needed for muscle spasms.     OLMESARTAN MEDOXOMIL PO  Take 40 mg by mouth daily.     predniSONE 10 MG tablet  Commonly known as:  DELTASONE  Take 4 tabs po x 2 days, then 3 x 2 days, then 2 x 2 days, then 1 x 2 days then stop.     promethazine 12.5 MG tablet  Commonly known as:  PHENERGAN  Take 1-2 tablets (12.5-25 mg total) by mouth every 6 (six) hours as needed for nausea or vomiting.        Diagnostic Studies: No results found.  Disposition: 01-Home or Self Care      Discharge Instructions    DME Crutches    Complete by:  As directed      DME Other see comment    Complete by:  As directed   Rolling knee scooter.           Follow-up Information    Follow up with Velna OchsALLDORF,PETER G, MD. Schedule an appointment as soon as possible for a visit in 1 week.   Specialty:  Orthopedic Surgery   Contact information:   [address] [place] [id number] [phone]        Signed: Drema HalonIDA, Reneisha Stilley PAUL 08/02/2015, 7:46 AM

## 2015-08-02 NOTE — Op Note (Signed)
NAMMarianne Sofia:  Hess, Melanie Hess         ACCOUNT NO.:  [id number]  MEDICAL RECORD NO.:  [id number]  LOCATION:  [id number]                        FACILITY:  [facility]  PHYSICIAN:  Lubertha Basqueeter G. Tank Difiore, M.D.DATE OF BIRTH:  10-31-1970  DATE OF PROCEDURE:  08/01/2015 DATE OF DISCHARGE:                              OPERATIVE REPORT   PREOPERATIVE DIAGNOSES: 1. Right heel Achilles insertional spur. 2. Right heel pump bump.  PROCEDURE: 1. Right pump bump excision. 2. Right Achilles insertional spur excision. 3. Right Achilles tendon repair.  ANESTHESIA:  General.  SURGEON:  Lubertha BasquePeter G. Jerl Santosalldorf, M.D.  ASSISTANT:  Elodia FlorenceAndrew Nida, PA.  INDICATION FOR PROCEDURE:  The patient is a 45 year old woman with a long history of very painful heel.  At this point, she cannot wear shoes.  She has failed all conservative measures.  By x-ray, she has insertional spur and a large pump bump.  She has extreme pain to palpation about her heel.  Despite her size, she is offered an excision of these bony prominences and Achilles repair in hopes of restoring her ability to wear shoes.  Informed operative consent was obtained after discussion of possible complications including reaction to anesthesia, infection, and wound healing problems.  SUMMARY OF FINDINGS AND PROCEDURE:  Under general anesthesia and through a posterior incision, I removed an Achilles insertional spur and a large pump bump under fluoroscopic guidance.  We then repaired the Achilles tendon with Biomet anchors and sutures.  I used fluoroscopy throughout the case to make appropriate intraoperative decisions and read all of these views myself.  Elodia Florencendrew Nida assisted throughout and was invaluable to the completion of the case.  DESCRIPTION OF PROCEDURE:  The patient was taken to the operating suite, where general anesthetic was applied with moderate difficulty.  She was positioned prone with all bony prominences appropriately padded.  Placed chest rolls and  table extenders in several arm holders.  We then employed a second assistant to make sure she stayed on the table.  Due to her size, this was an extremely difficult case in terms of positioning and actual performance of the procedure.  We then prepped and draped in normal sterile fashion.  After the administration of IV vancomycin and appropriate time out, the right leg was elevated, exsanguinated, and tourniquet inflated about the calf.  I made a midline incision with dissection down to the Achilles tendon.  I split this midline to expose the large insertional spur and pump bump.  I removed these with an oscillating saw, confirmed under fluoroscopic review.  We then irrigated.  I then placed 2 medial row anchors which were JuggerKnots by Biomet.  The 4 sutures emanated through these.  I passed 3 of the sutures through the Achilles tendon and tied these down to bone in our area of intended repair.  I then took the fourth suture and passes it up in [place] fashion through our split in the Achilles tendon and tied approximately 4-5 in our repair.  I then took the 6 limbs left distally and utilized a second row of [place] anchors by Biomet to perform a suture bridge repair further stabilizing the construct.  This gave us a nice tight repair.  The wound was thoroughly irrigated, followed by  reapproximation of deep tissues with 2-0 undyed Vicryl. Skin was closed with a subcuticular stitch and Steri-Strips.  The tourniquet was deflated during closure, and skin edges became pink and warm immediately.  Adaptic was applied to the wound, followed by dry gauze and a posterior splint of plaster with the ankle in neutral position.  Estimated blood loss and fluids obtained from anesthesia records as can accurate tourniquet time.  DISPOSITION:  The patient was extubated in the operating room and taken to recovery in stable condition.  Plans were for to stay overnight for pain control and observation  with probable discharge home in the morning.     Lubertha Basque Jerl Santos, M.D.     PGD/MEDQ  D:  08/01/2015  T:  08/02/2015  Job:  [id number]

## 2015-08-02 NOTE — Progress Notes (Signed)
Discharge instruction gave to pt and all questions answered. She received her paper prescriptions from this RN and is ready to discharge.

## 2015-08-02 NOTE — Evaluation (Signed)
Physical Therapy Evaluation Patient Details Name: Melanie EarthlyCheryl A Hess MRN: [id number] DOB: 08-20-1970 Today's Date: 08/02/2015   History of Present Illness  45 yo female with chronic R heel pain was seen for excision of spur on 08/01/15.  PMHx:  OSA, R ankle fracture (15 yrs ago), DM, anxiety  Clinical Impression  Pt was able to walk with two devices, but the safest thus far is RW.  Her home has level entrance and level interior and should be able to transition with help. Pt did not get to try knee walker as PT does not have an appropriately sized one, and will leave this option for follow up.  HHPT will need to see until pt can transition to outpatient therapy, and will follow acutely for gait and balance on all available devices.    Follow Up Recommendations Home health PT;Supervision for mobility/OOB    Equipment Recommendations  Rolling walker with 5" wheels    Recommendations for Other Services Rehab consult     Precautions / Restrictions Precautions Precautions: Fall (O2) Precaution Comments: did not move pt without O2 due to feeling light headed Restrictions Weight Bearing Restrictions: Yes RLE Weight Bearing: Non weight bearing      Mobility  Bed Mobility Overal bed mobility: Modified Independent             General bed mobility comments: Pt can lift her RLE off bed  Transfers Overall transfer level: Needs assistance Equipment used: Rolling walker (2 wheeled);Crutches;1 person hand held assist Transfers: Sit to/from UGI CorporationStand;Stand Pivot Transfers Sit to Stand: Min guard;Min assist;+2 physical assistance;+2 safety/equipment;From elevated surface Stand pivot transfers: Min guard;Min assist;+2 safety/equipment;+2 physical assistance       General transfer comment: reminders for hand placement  Ambulation/Gait Ambulation/Gait assistance: Min assist;Min guard;+2 physical assistance;+2 safety/equipment Ambulation Distance (Feet): 10 Feet (x 2 for each  device) Assistive device: Rolling walker (2 wheeled);Crutches;2 person hand held assist   Gait velocity: reduced Gait velocity interpretation: Below normal speed for age/gender General Gait Details: Pt is hopping on LLE with surprisingly good tolerance given that she has foot pain on LLE as well.  pt mainly is light headed with mobility and  with occasional stops and deep breathing did better with gait  Stairs            Wheelchair Mobility    Modified Rankin (Stroke Patients Only)       Balance Overall balance assessment: Needs assistance Sitting-balance support: Feet supported Sitting balance-Leahy Scale: Good     Standing balance support: Bilateral upper extremity supported Standing balance-Leahy Scale: Poor Standing balance comment: pt can almost balance on one foot to transfer crutches alone                             Pertinent Vitals/Pain Pain Assessment: 0-10 Pain Score: 6  Pain Location: R foot Pain Descriptors / Indicators: Operative site guarding;Aching Pain Intervention(s): Monitored during session;Premedicated before session;Repositioned    Home Living Family/patient expects to be discharged to:: Private residence Living Arrangements: Non-relatives/Friends Available Help at Discharge: Friend(s);Available 24 hours/day Type of Home: House Home Access: Level entry     Home Layout: One level Home Equipment: None Additional Comments: Pt used crutches in the past but not sure she has then    Prior Function Level of Independence: Independent with assistive device(s);Independent         Comments: had boots to protect her feet recently     Hand Dominance  Extremity/Trunk Assessment   Upper Extremity Assessment: Overall WFL for tasks assessed           Lower Extremity Assessment: Overall WFL for tasks assessed      Cervical / Trunk Assessment: Normal  Communication   Communication: No difficulties  Cognition  Arousal/Alertness: Awake/alert Behavior During Therapy: WFL for tasks assessed/performed Overall Cognitive Status: Within Functional Limits for tasks assessed                      General Comments General comments (skin integrity, edema, etc.): Pt is up to walk with both devices and decided RW is more stable of the two.  No bariatric knee walker is availaible to test, but pt is steady with RW.    Exercises        Assessment/Plan    PT Assessment Patient needs continued PT services  PT Diagnosis Difficulty walking   PT Problem List Decreased strength;Decreased range of motion;Decreased activity tolerance;Decreased balance;Decreased mobility;Decreased coordination;Decreased knowledge of use of DME;Decreased safety awareness;Obesity;Decreased skin integrity;Pain  PT Treatment Interventions DME instruction;Gait training;Functional mobility training;Therapeutic activities;Therapeutic exercise;Balance training;Neuromuscular re-education;Patient/family education   PT Goals (Current goals can be found in the Care Plan section) Acute Rehab PT Goals Patient Stated Goal: to get home today PT Goal Formulation: With patient Time For Goal Achievement: 08/16/15 Potential to Achieve Goals: Good    Frequency Min 3X/week   Barriers to discharge Decreased caregiver support (home alone in house during the day) has a friend who can come stay with her    Co-evaluation               End of Session Equipment Utilized During Treatment: Oxygen Activity Tolerance: Patient tolerated treatment well (light headed but BP was 142/77, sat 98% and pulse 70 in sitt) Patient left: in chair;with call bell/phone within reach;with nursing/sitter in room;with family/visitor present Nurse Communication: Mobility status;Other (comment) (Home needs)    Functional Assessment Tool Used: clinical judgment Functional Limitation: Mobility: Walking and moving around Mobility: Walking and Moving Around  Current Status [phone]): At least 20 percent but less than 40 percent impaired, limited or restricted Mobility: Walking and Moving Around Goal Status [phone]): At least 1 percent but less than 20 percent impaired, limited or restricted    Time: 1015-1054 PT Time Calculation (min) (ACUTE ONLY): 39 min   Charges:   PT Evaluation $PT Eval Moderate Complexity: 1 Procedure PT Treatments $Gait Training: 8-22 mins $Therapeutic Activity: 8-22 mins   PT G Codes:   PT G-Codes **NOT FOR INPATIENT CLASS** Functional Assessment Tool Used: clinical judgment Functional Limitation: Mobility: Walking and moving around Mobility: Walking and Moving Around Current Status ([id number]): At least 20 percent but less than 40 percent impaired, limited or restricted Mobility: Walking and Moving Around Goal Status [phone]): At least 1 percent but less than 20 percent impaired, limited or restricted    Ivar Drape 08/02/2015, 12:53 PM    Samul Dada, PT MS Acute Rehab Dept. Number: [facility] [id number] and [facility] [phone]

## 2015-08-02 NOTE — Care Management Note (Signed)
Case Management Note  Patient Details  Name: Melanie Hess MRN: [id number] Date of Birth: 1970-06-06  Subjective/Objective:                  excision of spur Action/Plan: Discharge planning Expected Discharge Date:                  Expected Discharge Plan:  Home/Self Care  In-House Referral:     Discharge planning Services  CM Consult  Post Acute Care Choice:  NA Choice offered to:  Patient  DME Arranged:  3-N-1, Crutches, Walker rolling, Wheelchair manual DME Agency:  [facility]  HH Arranged:    HH Agency:  NA  Status of Service:     If discussed at [employer] Length of [employer] Meetings, dates discussed:    Additional Comments: CM spoke with pt to explain her insurance will not cover HHPT and pt states she is unable to cover the out of pocket expense. CM told pt about [facility] [address] [facility] where a knee scooter can be leased.  CM has called [facility] to please arrange for delivery of a bar wheelchair with cushion, bari commode, and bari rolling walker.  Pt has given [facility] a contact number [phone] for delivery.  NO other CM needs were communicated. Yves DillJeffries, Pratt Bress Christine, RN 08/02/2015, 2:17 PM

## 2015-08-02 NOTE — Plan of Care (Signed)
Problem: Acute Rehab PT Goals(only PT should resolve) Goal: Pt Will Ambulate Maintaining NWB

## 2015-08-04 ENCOUNTER — Encounter (HOSPITAL_COMMUNITY): Payer: Self-pay | Admitting: Orthopaedic Surgery

## 2015-08-13 NOTE — Telephone Encounter (Signed)
Called [employer], breo has been approved. PA # [id number]. Nothing further needed.

## 2015-08-19 ENCOUNTER — Ambulatory Visit (INDEPENDENT_AMBULATORY_CARE_PROVIDER_SITE_OTHER): Payer: Medicaid Other | Admitting: Pharmacist Clinician (PhC)/ Clinical Pharmacy Specialist

## 2015-08-19 ENCOUNTER — Encounter: Payer: Self-pay | Admitting: Pharmacist Clinician (PhC)/ Clinical Pharmacy Specialist

## 2015-08-19 DIAGNOSIS — I10 Essential (primary) hypertension: Secondary | ICD-10-CM | POA: Diagnosis not present

## 2015-08-19 NOTE — Progress Notes (Signed)
Patient ID: Melanie Hess                 DOB: 09/21/1970                      MRN: [id number]     HPI: Melanie Hess is a 45 y.o. female patient of Dr. [name] presenting for blood pressure follow up.   She was seen several weeks ago, just prior to surgery for a bone spur on her right heel.  At that time her lisinopril was doubled from 20 to 40 mg and the amlodipine from 5 to 10 mg.  Patient reports no problems with dose increase on either of these.  Her foot surgery went well, but she is having a difficult time with mobility, as she cannot put weight on that foot.  She fell 3 times while using crutches and is now trying to maneuver a wheelchair and walker.  Her other foot surgery should be later in August.  She reports some pain on a daily basis post-surgery, and is taking tylenol #3 and methocarbamol daily.    She is scheduled for a sleep study on August 28.  She noted that she slept very well in the hospital while on oxygen and is looking forward to getting the sleep study done as she has poor sleep habits.  She reports she no longer takes furosemide because it caused her to wet herself at work.   She also reports that her albuterol use has decreased after the addition of Breo, but she does still use it more commonly in the mornings.  Cardiac Hx: HTN, DM, normal LV function, morbid obesity  Current HTN meds:  Amlodipine 10 mg in the morning Lisinopril 40 mg in the morning  BP goal: <140/90 - would ultimately like to get her closer to 120/80   Family History: Her mother has HTN, a heart murmur and maybe asthma. Her sister also suffers from diabetes.   Social History: She has not had a cigarette in about 10 years and even then a pack would last her about 1 year. She denies smokeless tobacco and reports she has not had alcohol in about 2 months because she is so nervous about her diabetes.   Diet: She reports that she does not eat that much. She eats 2 meals a day. When she is  working she eats out eating fried chicken and wings. When she eats at home she eats cereal, chicken, and broccoli. She states that she love cheese and most of her meals include cheese. She does add salt at the table, but has decreased this since she is nervous about her health.   Has given up caffeinated sodas since her last visit.   She was drinking 3-6 liters of soda per day (Coke/pepsi/mt dew) and is now instead drinking watered down ginger ale.    .   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    Home BP readings: She does not have a home cuff.   Wt Readings from Last 3 Encounters:  08/01/15 (!) 399 lb 9.6 oz (181.3 kg)  07/29/15 (!) 399 lb 9.6 oz (181.3 kg)  07/23/15 (!) 400 lb (181.4 kg)   BP Readings from Last 3 Encounters:  08/02/15 (!) 126/54  07/29/15 (!) 156/102  07/23/15 (!) 142/86   Pulse Readings from Last 3 Encounters:  08/02/15 72  07/23/15 79  07/21/15 70    Renal function: CrCl cannot be calculated (Unknown ideal weight.).  Past Medical History:  Diagnosis Date  . Ankle fracture, right 2001  . Anxiety   . Arthritis   . Asthma   . Carpal tunnel syndrome    Bilateral  . Diabetes mellitus without complication (HCC)    Type II  . GERD (gastroesophageal reflux disease)   . Hypertension   . Migraine   . OSA (obstructive sleep apnea)   . Plantar fasciitis of right foot   . Shortness of breath dyspnea    with walking short distances    Current Outpatient Prescriptions on File Prior to Visit  Medication Sig Dispense Refill  . acetaminophen (TYLENOL) 500 MG tablet Take 2 tablets (1,000 mg total) by mouth every 6 (six) hours as needed. (Patient taking differently: Take 1,500 mg by mouth 2 (two) times daily as needed for mild pain (for migraine). ) 30 tablet 0  . acetaminophen-codeine (TYLENOL #3) 300-30 MG tablet Take 1-2 tablets by mouth every 6 (six) hours as  needed for moderate pain. 40 tablet 0  . albuterol (PROVENTIL) (2.5 MG/3ML) 0.083% nebulizer solution Take 2.5 mg by nebulization 2 (two) times daily as needed for wheezing or shortness of breath.     Marland Kitchen amLODipine (NORVASC) 10 MG tablet Take 1 tablet (10 mg total) by mouth daily. 180 tablet 3  . fluticasone furoate-vilanterol (BREO ELLIPTA) 100-25 MCG/INH AEPB Inhale 1 puff into the lungs daily. 60 each 6  . lisinopril (PRINIVIL,ZESTRIL) 40 MG tablet Take 1 tablet (40 mg total) by mouth daily. 90 tablet 1  . metFORMIN (GLUCOPHAGE-XR) 500 MG 24 hr tablet Take 1,000 mg by mouth 2 (two) times daily.  3  . methocarbamol (ROBAXIN) 500 MG tablet Take 1 tablet (500 mg total) by mouth every 6 (six) hours as needed for muscle spasms. 40 tablet 0  . OLMESARTAN MEDOXOMIL PO Take 40 mg by mouth daily.    . predniSONE (DELTASONE) 10 MG tablet Take 4 tabs po x 2 days, then 3 x 2 days, then 2 x 2 days, then 1 x 2 days then stop. (Patient taking differently: Take 10-80 mg by mouth daily with breakfast. Completed 07-31-15 Take 4 tabs po x 2 days, then 3 x 2 days, then 2 x 2 days, then 1 x 2 days then stop.) 20 tablet 0  . promethazine (PHENERGAN) 12.5 MG tablet Take 1-2 tablets (12.5-25 mg total) by mouth every 6 (six) hours as needed for nausea or vomiting. 30 tablet 0   No current facility-administered medications on file prior to visit.     Allergies  Allergen Reactions  . Aspirin Shortness Of Breath and Swelling  . Nsaids Shortness Of Breath and Swelling  . Penicillins Swelling, Rash and Other (See Comments)    PCN reaction causing immediate rash, facial/tongue/throat swelling, SOB or lightheadedness with hypotension: YES PCN reaction causing severe rash involving mucus membranes or skin necrosis: YES PCN reaction that required hospitalization NO PCN reaction occurring within the last 10 years: NO   . Percocet [Oxycodone-Acetaminophen] Hives and Itching  . Vicodin [Hydrocodone-Acetaminophen] Hives and  Itching  . Tramadol Nausea Only         Thank you, Phillips Hay, PharmD CPP  [facility]  08/19/2015 9:59 AM

## 2015-08-19 NOTE — Patient Instructions (Signed)
Return for a a follow up appointment in 2 months (we will call you in about a month for that)  Your blood pressure today is 142/96 (goal is < 142/90)  Take your BP meds as follows:  Continue with lisinopril 40 mg and amlodipine 10 mg  Bring all of your meds, your BP cuff and your record of home blood pressures to your next appointment.  Exercise as you're able, try to walk approximately 30 minutes per day.  Keep salt intake to a minimum, especially watch canned and prepared boxed foods.  Eat more fresh fruits and vegetables and fewer canned items.  Avoid eating in fast food restaurants.    HOW TO TAKE YOUR BLOOD PRESSURE: . Rest 5 minutes before taking your blood pressure. .  Don't smoke or drink caffeinated beverages for at least 30 minutes before. . Take your blood pressure before (not after) you eat. . Sit comfortably with your back supported and both feet on the floor (don't cross your legs). . Elevate your arm to heart level on a table or a desk. . Use the proper sized cuff. It should fit smoothly and snugly around your bare upper arm. There should be enough room to slip a fingertip under the cuff. The bottom edge of the cuff should be 1 inch above the crease of the elbow. . Ideally, take 3 measurements at one sitting and record the average.

## 2015-08-19 NOTE — Assessment & Plan Note (Signed)
Her BP is much improved today from her last visit.  Would still like to see an additional 10 point drop to keep diastolic pressure less than 90.  At this point our best option would be to add a diuretic, however with her current immobility, will not do that today.  After her second foot surgery and sleep study, will re-evaluate her pressure and determine if she still needs additional BP lowering

## 2015-09-03 ENCOUNTER — Ambulatory Visit (INDEPENDENT_AMBULATORY_CARE_PROVIDER_SITE_OTHER): Payer: Medicaid Other | Admitting: Adult Health

## 2015-09-03 ENCOUNTER — Encounter: Payer: Self-pay | Admitting: Adult Health

## 2015-09-03 DIAGNOSIS — J45901 Unspecified asthma with (acute) exacerbation: Secondary | ICD-10-CM

## 2015-09-03 MED ORDER — ALBUTEROL SULFATE (2.5 MG/3ML) 0.083% IN NEBU: 2.5000 mg | INHALATION_SOLUTION | Freq: Two times a day (BID) | RESPIRATORY_TRACT | 5 refills | Status: DC | PRN

## 2015-09-03 MED ORDER — FLUTICASONE FUROATE-VILANTEROL 100-25 MCG/INH IN AEPB: 1.0000 | INHALATION_SPRAY | Freq: Every day | RESPIRATORY_TRACT | 5 refills | Status: DC

## 2015-09-03 NOTE — Progress Notes (Signed)
History of Present Illness Melanie Hess is an obese 45 y.o. female with BMI of 4261, non-smoker with history of asthma since childhood followed by Dr. Marchelle Gearingamaswamy.   09/03/2015 Follow up : Asthma  Patient returns for a 6 week follow-up. Patient was seen last visit with asthma flare. She was started on prednisone taper. She was also started on BREO inhaler daily. Since last visit. She is feeling better.  Says wheezing is better. Still has occasional "asthma attack" now.  Gets winded with walking .  Had heel spur surgery on right foot.  Really feel BREO is helping.  On ACE inhibitor . Does have dry cough esp in am .  He denies any chest pain, orthopnea, PND, increased leg swelling, nausea, vomiting, diarrhea..   TEST    FENO:Attempted in office 12 times, but unable to complete test.  Tests: Methylcholine Challenge.02/26/2014  The FVC, FEV1, FEV1/FVC ratio and FEF25-75% are reduced indicating airway obstruction. Following administration of bronchodilators, there is no significant response.  Conclusions: Methacholine Inhalation Challenge Pulmonary Function Diagnosis: Moderate Obstructive Airways Disease -Peripheral Airway Positive for hyper-reactive airways at 106.56 CDU's  02/28/2014: Echo: EF:55-60% Trivial mitral valve regurgitation PAP normal   Past medical hx Past Medical History:  Diagnosis Date  . Ankle fracture, right 2001  . Anxiety   . Arthritis   . Asthma   . Carpal tunnel syndrome    Bilateral  . Diabetes mellitus without complication (HCC)    Type II  . GERD (gastroesophageal reflux disease)   . Hypertension   . Migraine   . OSA (obstructive sleep apnea)   . Plantar fasciitis of right foot   . Shortness of breath dyspnea    with walking short distances     Past surgical hx, Family hx, Social hx all reviewed.  Current Outpatient Prescriptions on File Prior to Visit  Medication Sig  . acetaminophen (TYLENOL) 500 MG tablet Take 2 tablets (1,000 mg  total) by mouth every 6 (six) hours as needed. (Patient taking differently: Take 1,500 mg by mouth 2 (two) times daily as needed for mild pain (for migraine). )  . acetaminophen-codeine (TYLENOL #3) 300-30 MG tablet Take 1-2 tablets by mouth every 6 (six) hours as needed for moderate pain.  Marland Kitchen. albuterol (PROVENTIL) (2.5 MG/3ML) 0.083% nebulizer solution Take 2.5 mg by nebulization 2 (two) times daily as needed for wheezing or shortness of breath.   . fluticasone furoate-vilanterol (BREO ELLIPTA) 100-25 MCG/INH AEPB Inhale 1 puff into the lungs daily.  Marland Kitchen. lisinopril (PRINIVIL,ZESTRIL) 40 MG tablet Take 1 tablet (40 mg total) by mouth daily.  . metFORMIN (GLUCOPHAGE-XR) 500 MG 24 hr tablet Take 1,000 mg by mouth 2 (two) times daily.  . methocarbamol (ROBAXIN) 500 MG tablet Take 1 tablet (500 mg total) by mouth every 6 (six) hours as needed for muscle spasms.  . OLMESARTAN MEDOXOMIL PO Take 40 mg by mouth daily.  . promethazine (PHENERGAN) 12.5 MG tablet Take 1-2 tablets (12.5-25 mg total) by mouth every 6 (six) hours as needed for nausea or vomiting.  Marland Kitchen. amLODipine (NORVASC) 10 MG tablet Take 1 tablet (10 mg total) by mouth daily. (Patient not taking: Reported on 09/03/2015)   No current facility-administered medications on file prior to visit.      Allergies  Allergen Reactions  . Aspirin Shortness Of Breath and Swelling  . Nsaids Shortness Of Breath and Swelling  . Penicillins Swelling, Rash and Other (See Comments)    PCN reaction causing immediate rash, facial/tongue/throat swelling,  SOB or lightheadedness with hypotension: YES PCN reaction causing severe rash involving mucus membranes or skin necrosis: YES PCN reaction that required hospitalization NO PCN reaction occurring within the last 10 years: NO   . Percocet [Oxycodone-Acetaminophen] Hives and Itching  . Vicodin [Hydrocodone-Acetaminophen] Hives and Itching  . Tramadol Nausea Only    Review Of Systems:  Constitutional:   No   weight loss, night sweats,  Fevers, chills, fatigue, or  lassitude.  HEENT:   No headaches,  Difficulty swallowing,  Tooth/dental problems, or  Sore throat,                No sneezing, itching, ear ache, nasal congestion, post nasal drip,   CV:  No chest pain,   anasarca, dizziness, palpitations, syncope.   GI  No heartburn, indigestion, abdominal pain, nausea, vomiting, diarrhea, change in bowel habits, loss of appetite, bloody stools.   Resp: + shortness of breath with walking No excess mucus, no productive cough,     Skin: no rash or lesions.  GU: no dysuria, change in color of urine, no urgency or frequency.  No flank pain, no hematuria   MS:  No joint pain or swelling.  No decreased range of motion.  No back pain.  Psych:  No change in mood or affect. No depression or anxiety.  No memory loss.   Vital Signs BP 130/86 (BP Location: Left Wrist, Cuff Size: Normal)   Pulse 82   Ht 5\' 6"  (1.676 m)   Wt (!) 400 lb (181.4 kg)   SpO2 99%   BMI 64.56 kg/m    Physical Exam: GEN: A/Ox3; pleasant , NAD, morbidly obese   HEENT:  [place]/AT,  EACs-clear, TMs-wnl, NOSE-clear, THROAT-clear, no lesions, no postnasal drip or exudate noted.   NECK:  Supple w/ fair ROM; no JVD; normal carotid impulses w/o bruits; no thyromegaly or nodules palpated; no lymphadenopathy.  RESP  Clear  P & A; w/o, wheezes/ rales/ or rhonchi.no accessory muscle use, no dullness to percussion  CARD:  RRR, no m/r/g  ,tr  peripheral edema, pulses intact, no cyanosis or clubbing.  GI:   Soft & nt; nml bowel sounds; no organomegaly or masses detected.  Musco: Warm bil, no deformities or joint swelling noted.   Neuro: alert, no focal deficits noted.    Skin: Warm, no lesions or rashes     Rubye Oaksammy Neilani Duffee, NP 09/03/2015  11:54 AM

## 2015-09-03 NOTE — Patient Instructions (Addendum)
Discuss with primary MD that Lisinopril may aggravate your cough /wheezing.  Continue on BREO daily , rinse after use.  Follow up Dr. Marchelle Gearingamaswamy in 3-4 months and As needed

## 2015-09-03 NOTE — Addendum Note (Signed)
Addended by: Karalee HeightOX, Jeannelle Wiens P on: 09/03/2015 12:12 PM   Modules accepted: Orders

## 2015-09-03 NOTE — Assessment & Plan Note (Signed)
Recent exacerbation, now resolved. Improved asthma control on BREO Consider changing ACE as may aggravate cough /wheezing   Plan  Discuss with primary MD that Lisinopril may aggravate your cough /wheezing.  Continue on BREO daily , rinse after use.  Follow up Dr. Marchelle Gearingamaswamy in 3-4 months and As needed

## 2015-09-09 ENCOUNTER — Telehealth: Payer: Self-pay | Admitting: Internal Medicine

## 2015-09-09 MED ORDER — PREDNISONE 10 MG PO TABS: ORAL_TABLET | ORAL | 0 refills | Status: DC

## 2015-09-09 MED ORDER — ALBUTEROL SULFATE HFA 108 (90 BASE) MCG/ACT IN AERS: 2.0000 | INHALATION_SPRAY | Freq: Four times a day (QID) | RESPIRATORY_TRACT | 6 refills | Status: DC | PRN

## 2015-09-09 NOTE — Telephone Encounter (Signed)
Might be related to heat  Please take prednisone 40 mg x1 day, then 30 mg x1 day, then 20 mg x1 day, then 10 mg x1 day, and then 5 mg x1 day and stop  And refill albuterol  To ER if worse  Dr. Kalman ShanMurali Joab Carden, M.D., [facility] Pulmonary and Critical Care Medicine Staff Physician [facility] Pager: [phone], If no answer or between  15:00h - 7:00h: call [phone]  09/09/2015 4:28 PM

## 2015-09-09 NOTE — Telephone Encounter (Signed)
lmtcb X1 for pt  

## 2015-09-09 NOTE — Telephone Encounter (Signed)
Patient notified of Dr. Jane Canaryamaswamy's recommendations.  rx sent to pharmacy. Nothing further needed.

## 2015-09-09 NOTE — Telephone Encounter (Signed)
[phone] pt calling back

## 2015-09-09 NOTE — Telephone Encounter (Signed)
Patient states that she has been having a lot of asthma flare ups out of nowhere.  Using her inhaler and neb treatments a lot more often.  Coughing a lot, dry hacking cough, sore chest from coughing so much.   Allergies  Allergen Reactions  . Aspirin Shortness Of Breath and Swelling  . Nsaids Shortness Of Breath and Swelling  . Penicillins Swelling, Rash and Other (See Comments)    PCN reaction causing immediate rash, facial/tongue/throat swelling, SOB or lightheadedness with hypotension: YES PCN reaction causing severe rash involving mucus membranes or skin necrosis: YES PCN reaction that required hospitalization NO PCN reaction occurring within the last 10 years: NO   . Percocet [Oxycodone-Acetaminophen] Hives and Itching  . Vicodin [Hydrocodone-Acetaminophen] Hives and Itching  . Tramadol Nausea Only

## 2015-09-15 ENCOUNTER — Ambulatory Visit (HOSPITAL_BASED_OUTPATIENT_CLINIC_OR_DEPARTMENT_OTHER): Payer: Medicaid Other | Attending: Internal Medicine | Admitting: Cardiovascular Disease

## 2015-09-15 VITALS — Ht 66.0 in | Wt >= 6400 oz

## 2015-09-15 DIAGNOSIS — Z6841 Body Mass Index (BMI) 40.0 and over, adult: Secondary | ICD-10-CM | POA: Diagnosis not present

## 2015-09-15 DIAGNOSIS — E669 Obesity, unspecified: Secondary | ICD-10-CM | POA: Diagnosis not present

## 2015-09-15 DIAGNOSIS — Z7984 Long term (current) use of oral hypoglycemic drugs: Secondary | ICD-10-CM | POA: Diagnosis not present

## 2015-09-15 DIAGNOSIS — I10 Essential (primary) hypertension: Secondary | ICD-10-CM | POA: Diagnosis not present

## 2015-09-15 DIAGNOSIS — E119 Type 2 diabetes mellitus without complications: Secondary | ICD-10-CM | POA: Insufficient documentation

## 2015-09-15 DIAGNOSIS — G4733 Obstructive sleep apnea (adult) (pediatric): Secondary | ICD-10-CM | POA: Diagnosis not present

## 2015-09-15 DIAGNOSIS — G4719 Other hypersomnia: Secondary | ICD-10-CM | POA: Diagnosis not present

## 2015-09-15 DIAGNOSIS — Z79899 Other long term (current) drug therapy: Secondary | ICD-10-CM | POA: Diagnosis not present

## 2015-09-15 DIAGNOSIS — R0683 Snoring: Secondary | ICD-10-CM

## 2015-09-21 NOTE — Procedures (Signed)
Patient Name: Melanie, Hess Date: 09/15/2015 Gender: Female D.O.B: [birth year] Age (years): 45 Referring Provider: Nadean Corwin Hilty Height (inches): 65 Interpreting Physician: Shelva Majestic MD, ABSM Weight (lbs): 400 RPSGT: Carolin Coy BMI: 67 MRN: [id number] Neck Size: 17.00  CLINICAL INFORMATION Sleep Study Type: Split Night CPAP Indication for sleep study: Diabetes, Excessive Daytime Sleepiness, Hypertension, Obesity, Snoring Epworth Sleepiness Score: 19  SLEEP STUDY TECHNIQUE As per the AASM Manual for the Scoring of Sleep and Associated Events v2.3 (April 2016) with a hypopnea requiring 4% desaturations. The channels recorded and monitored were frontal, central and occipital EEG, electrooculogram (EOG), submentalis EMG (chin), nasal and oral airflow, thoracic and abdominal wall motion, anterior tibialis EMG, snore microphone, electrocardiogram, and pulse oximetry. Continuous positive airway pressure (CPAP) was initiated when the patient met split night criteria and was titrated according to treat sleep-disordered breathing.  MEDICATIONS acetaminophen (TYLENOL) 500 MG tablet Taking 05/29/15 -- Olivia Canter Sam, PA-C Take 2 tablets (1,000 mg total) by mouth every 6 (six) hours as needed. Patient taking differently: Take 1,500 mg by mouth 2 (two) times daily as needed for mild pain (for migraine).  acetaminophen-codeine (TYLENOL #3) 300-30 MG tablet Taking 08/02/15 -- Loni Dolly, PA-C Take 1-2 tablets by mouth every 6 (six) hours as needed for moderate pain. albuterol (PROVENTIL HFA;VENTOLIN HFA) 108 (90 Base) MCG/ACT inhaler 09/09/15 -- Brand Males, MD Inhale 2 puffs into the lungs every 6 (six) hours as needed for wheezing or shortness of breath. albuterol (PROVENTIL) (2.5 MG/3ML) 0.083% nebulizer solution 09/03/15 -- Tammy S Parrett, NP Take 3 mLs (2.5 mg total) by nebulization 2 (two) times daily as needed for wheezing or shortness of breath. amLODipine  (NORVASC) 10 MG tablet Not Taking 07/29/15 -- Pixie Casino, MD Take 1 tablet (10 mg total) by mouth daily. Patient not taking: Reported on 09/03/2015 fluticasone furoate-vilanterol (BREO ELLIPTA) 100-25 MCG/INH AEPB 09/03/15 -- Tammy S Parrett, NP Inhale 1 puff into the lungs daily. lisinopril (PRINIVIL,ZESTRIL) 40 MG tablet Taking 07/29/15 -- Pixie Casino, MD Take 1 tablet (40 mg total) by mouth daily. metFORMIN (GLUCOPHAGE-XR) 500 MG 24 hr tablet Taking 12/20/14 -- Historical Provider, MD methocarbamol (ROBAXIN) 500 MG tablet Taking 08/02/15 -- Loni Dolly, PA-C Take 1 tablet (500 mg total) by mouth every 6 (six) hours as needed for muscle spasms. OLMESARTAN MEDOXOMIL PO Taking -- -- Historical Provider, MD predniSONE (DELTASONE) 10 MG tablet 09/09/15 -- Brand Males, MD 40 mg x1 day, 30 mg x1 day, 20 mg x1 day, 10 mg x1 day, 5 mg x1 day and stop promethazine (PHENERGAN) 12.5 MG tablet Taking 08/02/15 -- Loni Dolly, PA-C Take 1-2 tablets (12.5-25 mg total) by mouth every 6 (six) hours as needed for nausea or vomiting.  Medications administered by patient during sleep study : No sleep medicine administered.  RESPIRATORY PARAMETERS Diagnostic Total AHI (/hr): 15.8 RDI (/hr): 21.6 OA Index (/hr): 0.4 CA Index (/hr): 0.0 REM AHI (/hr): 66.7 NREM AHI (/hr): 14.4 Supine AHI (/hr): 13.2 Non-supine AHI (/hr): 15.96 Min O2 Sat (%): 66.00 Mean O2 (%): 95.85 Time below 88% (min): 3.1   Titration Optimal Pressure (cm): 19 AHI at Optimal Pressure (/hr): 0.0 Min O2 at Optimal Pressure (%): 95.0 Supine % at Optimal (%): 0 Sleep % at Optimal (%): 100    SLEEP ARCHITECTURE The recording time for the entire night was 418.8 minutes. During a baseline period of 198.0 minutes, the patient slept for 167.0 minutes in REM and nonREM, yielding a sleep efficiency of  84.3%. Sleep onset after lights out was 7.0 minutes with a REM latency of 186.5 minutes. The patient spent 13.47% of the night in stage  N1 sleep, 83.83% in stage N2 sleep, 0.00% in stage N3 and 2.69% in REM. During the titration period of 219.7 minutes, the patient slept for 198.4 minutes in REM and nonREM, yielding a sleep efficiency of 90.3%. Sleep onset after CPAP initiation was 15.3 minutes with a REM latency of 17.0 minutes. The patient spent 21.67% of the night in stage N1 sleep, 47.83% in stage N2 sleep, 0.00% in stage N3 and 30.50% in REM.  CARDIAC DATA The 2 lead EKG demonstrated sinus rhythm. The mean heart rate was 67.28 beats per minute. Other EKG findings include: Rare PAC.  LEG MOVEMENT DATA The total Periodic Limb Movements of Sleep (PLMS) were 0. The PLMS index was 0.00 .  IMPRESSIONS - Moderate obstructive sleep apnea  overall during the diagnostic study (AHI = 15.8/hour); however, there was very severe sleep apnea during REM sleep (AHI 66.7/h).  CPAP was titrated up tp 19 cm of water due to continued snoring. AHI at 15 cm - 19 cm water was 0.  - No significant central sleep apnea occurred during the diagnostic portion of the study (CAI = 0.0/hour). - Severe oxygen desaturation was noted during the diagnostic portion of the study to a nadir of 66.00%. - Abnormal sleep architecture during the diagnostic study with absence of slow wave sleep and prolonged latency to REM sleep.  - The patient snored with Moderate snoring volume during the diagnostic portion of the study. - No cardiac abnormalities were noted during this study. - Clinically significant periodic limb movements did not occur during sleep.  DIAGNOSIS - Obstructive Sleep Apnea (327.23 [G47.33 ICD-10])  RECOMMENDATIONS - Recommend an initial trial of CPAP therapy with C-Flex/EPR of 3 at19 cm H2O with a Medium size Resmed Full Face Mask AirFit F20 mask and heated humidification. - Avoid alcohol, sedatives and other CNS depressants that may worsen sleep apnea and disrupt normal sleep architecture. - Sleep hygiene should be reviewed to assess factors  that may improve sleep quality. - Weight management (BMI 67) and regular exercise should be initiated or continued. - Recommend a download be obtained in 30 days and sleep cinic evaluation.  [Electronically signed] 09/21/2015 07:47 PM  Shelva Majestic MD, [facility], [place], American Board of Sleep Medicine   NPI: [id number] [place] PH: [phone]   FX: [phone] L[place]

## 2015-10-21 ENCOUNTER — Telehealth: Payer: Self-pay | Admitting: *Deleted

## 2015-10-21 NOTE — Telephone Encounter (Signed)
Left message for pt to call.

## 2015-10-21 NOTE — Telephone Encounter (Signed)
-----   Message from Lennette Biharihomas A Kelly, MD sent at 09/21/2015  7:52 PM EDT ----- Melanie MortimerWanda notify pt and please set up with DME for CPAP initiation

## 2015-10-21 NOTE — Progress Notes (Signed)
Left message for pt to call  Order and records faxed to [facility]

## 2015-10-22 NOTE — Telephone Encounter (Signed)
Follow up ° °Pt voiced returning nurses call. ° °Please f/u °

## 2015-10-22 NOTE — Telephone Encounter (Signed)
Left message for pt to call.

## 2015-10-22 NOTE — Progress Notes (Signed)
pt aware of results  

## 2015-10-22 NOTE — Telephone Encounter (Signed)
Follow up   Pt verbalized that she Is returning call for rn

## 2015-10-22 NOTE — Telephone Encounter (Signed)
Follow up  Pt voiced returning nurses call from yesterday.  Please f/u with pt

## 2015-10-22 NOTE — Telephone Encounter (Signed)
Spoke with pt, aware [facility] with [facility] for [facility] equipment she will need based on dr [facility]elly's recommendations.

## 2015-10-23 ENCOUNTER — Encounter: Payer: Self-pay | Admitting: Internal Medicine

## 2015-10-23 ENCOUNTER — Ambulatory Visit (INDEPENDENT_AMBULATORY_CARE_PROVIDER_SITE_OTHER): Payer: Medicaid Other | Admitting: Internal Medicine

## 2015-10-23 VITALS — BP 153/87 | HR 69 | Ht 66.0 in | Wt 399.2 lb

## 2015-10-23 DIAGNOSIS — R0609 Other forms of dyspnea: Secondary | ICD-10-CM

## 2015-10-23 DIAGNOSIS — I10 Essential (primary) hypertension: Secondary | ICD-10-CM | POA: Diagnosis not present

## 2015-10-23 DIAGNOSIS — G4733 Obstructive sleep apnea (adult) (pediatric): Secondary | ICD-10-CM | POA: Insufficient documentation

## 2015-10-23 DIAGNOSIS — Z79899 Other long term (current) drug therapy: Secondary | ICD-10-CM

## 2015-10-23 DIAGNOSIS — Z9989 Dependence on other enabling machines and devices: Secondary | ICD-10-CM

## 2015-10-23 MED ORDER — CHLORTHALIDONE 25 MG PO TABS: 25.0000 mg | ORAL_TABLET | Freq: Every day | ORAL | 3 refills | Status: DC

## 2015-10-23 NOTE — Patient Instructions (Signed)
Medication Instructions:  START Chlorthalidone 25mg  Take 1 tab by mouth daily   Labwork: Your physician recommends that you return for lab work in: 1 week complete a BMET  Testing/Procedures: None   Follow-Up: Your physician recommends that you schedule a follow-up appointment in: 3 months with Dr Rennis GoldenHilty  Your physician recommends that you schedule a follow-up appointment in: 2-3 weeks with Hypertension clinic  Any Other Special Instructions Will Be Listed Below (If Applicable).    If you need a refill on your cardiac medications before your next appointment, please call your pharmacy.  HTN clinic  DATE:__________________________________________________  TIME:_________________________________________AM/PM   HILTY  DATE:______________________________________  TIME:______________________AM/PM

## 2015-10-23 NOTE — Progress Notes (Signed)
OFFICE NOTE  Chief Complaint:  Recurrent asthma, fatigue  Primary Care Physician: Karie ChimeraEESE,BETTI D, MD  HPI:  Melanie Hess is a pleasant 45 year old female, referred to me by Dr. Mayford KnifeWilliams. She's had numerous recent emergency department visits for asthma. She has wheezing and shortness of breath for which she receives nebulizers and steroids and has improvement. She recently saw Dr. Marchelle Gearingamaswamy in pulmonary critical care who recommended a number of tests including an echocardiogram. That has not yet been performed. There is some concern that this may not all be related to asthma. She does have a history of hypertension and morbid obesity. She reports some chest wall tenderness which is reproducible to palpation but no anginal chest pain.  07/21/2015  Melanie Hess returns today for follow-up. She reports recently that she's had worsening asthma and has had numerous ER visits. Her echocardiogram showed normal LV function. She's had worsening weight gain and now has a BMI of 64. [facility] parents of weight management program. Blood pressure today is very poorly controlled at 164/112, which she says is the best blood pressure C seen in a while. I asked her what a normal blood pressure was and she said that she thought this was normal. I educated her that I would ideally like to see her blood pressure 120/80. We reviewed her medicines today and she is only on lisinopril 2.5 mg daily. When I last saw her I recommended a sleep study which she never scheduled.  10/23/2015  Melanie Hess was seen today for follow-up. In the interim she's been diagnosed with obstructive sleep apnea with an AHI of 66.7/H indicating Moderate to severe apnea. She was prescribed CPAP with a trial of 19 cm H2O. She has not yet been fitted with a CPAP mask. Blood pressure has improved however not at goal. She has had some recurrent asthma and seems to be tolerating lisinopril without  chronic cough. Blood pressure is still not at goal. A diuretic was considered at the hypertension clinic however she was immobile secondary to foot surgery and it was not started. She is pursuing bariatric surgery and has an appointment this month.  PMHx:  Past Medical History:  Diagnosis Date  . Ankle fracture, right 2001  . Anxiety   . Arthritis   . Asthma   . Carpal tunnel syndrome    Bilateral  . Diabetes mellitus without complication (HCC)    Type II  . GERD (gastroesophageal reflux disease)   . Hypertension   . Migraine   . OSA (obstructive sleep apnea)   . Plantar fasciitis of right foot   . Shortness of breath dyspnea    with walking short distances    Past Surgical History:  Procedure Laterality Date  . CARPAL TUNNEL RELEASE Left   . HEEL SPUR RESECTION Right 08/01/2015   Procedure: HEEL SPUR EXCISIONS;  Surgeon: Marcene CorningPeter Dalldorf, MD;  [facility];  Service: Orthopedics;  Laterality: Right;  . TUBAL LIGATION      FAMHx:  Family History  Problem Relation Age of Onset  . Hypertension Mother   . Breast cancer Maternal Aunt   . Brain cancer Cousin     SOCHx:   reports that she has never smoked. She has never used smokeless tobacco. She reports that she does not drink alcohol or use drugs.  ALLERGIES:  Allergies  Allergen Reactions  . Aspirin Shortness Of Breath and Swelling  . Nsaids Shortness Of Breath and Swelling  .  Penicillins Swelling, Rash and Other (See Comments)    PCN reaction causing immediate rash, facial/tongue/throat swelling, SOB or lightheadedness with hypotension: YES PCN reaction causing severe rash involving mucus membranes or skin necrosis: YES PCN reaction that required hospitalization NO PCN reaction occurring within the last 10 years: NO   . Percocet [Oxycodone-Acetaminophen] Hives and Itching  . Vicodin [Hydrocodone-Acetaminophen] Hives and Itching  . Tramadol Nausea Only    ROS: Pertinent items noted in HPI and remainder of  comprehensive ROS otherwise negative.  HOME MEDS: Current Outpatient Prescriptions  Medication Sig Dispense Refill  . acetaminophen (TYLENOL) 500 MG tablet Take 2 tablets (1,000 mg total) by mouth every 6 (six) hours as needed. (Patient taking differently: Take 1,500 mg by mouth 2 (two) times daily as needed for mild pain (for migraine). ) 30 tablet 0  . acetaminophen-codeine (TYLENOL #3) 300-30 MG tablet Take 1-2 tablets by mouth every 6 (six) hours as needed for moderate pain. 40 tablet 0  . albuterol (PROVENTIL HFA;VENTOLIN HFA) 108 (90 Base) MCG/ACT inhaler Inhale 2 puffs into the lungs every 6 (six) hours as needed for wheezing or shortness of breath. 1 Inhaler 6  . albuterol (PROVENTIL) (2.5 MG/3ML) 0.083% nebulizer solution Take 3 mLs (2.5 mg total) by nebulization 2 (two) times daily as needed for wheezing or shortness of breath. 75 mL 5  . amLODipine (NORVASC) 10 MG tablet Take 1 tablet (10 mg total) by mouth daily. 180 tablet 3  . fluticasone furoate-vilanterol (BREO ELLIPTA) 100-25 MCG/INH AEPB Inhale 1 puff into the lungs daily. 1 each 5  . lisinopril (PRINIVIL,ZESTRIL) 40 MG tablet Take 1 tablet (40 mg total) by mouth daily. 90 tablet 1  . metFORMIN (GLUCOPHAGE-XR) 500 MG 24 hr tablet Take 1,000 mg by mouth 2 (two) times daily.  3  . methocarbamol (ROBAXIN) 500 MG tablet Take 1 tablet (500 mg total) by mouth every 6 (six) hours as needed for muscle spasms. 40 tablet 0  . OLMESARTAN MEDOXOMIL PO Take 40 mg by mouth daily.    . predniSONE (DELTASONE) 10 MG tablet 40 mg x1 day, 30 mg x1 day, 20 mg x1 day, 10 mg x1 day, 5 mg x1 day and stop 11 tablet 0  . promethazine (PHENERGAN) 12.5 MG tablet Take 1-2 tablets (12.5-25 mg total) by mouth every 6 (six) hours as needed for nausea or vomiting. 30 tablet 0   No current facility-administered medications for this visit.     LABS/IMAGING: No results found for this or any previous visit (from the past 48 hour(s)). No results  found.  VITALS: BP (!) 153/87   Pulse 69   Ht 5\' 6"  (1.676 m)   Wt (!) 399 lb 3.2 oz (181.1 kg)   BMI 64.43 kg/m   EXAM: Deferred  EKG: Deferred  ASSESSMENT: 1. Asthma with frequent exacerbations 2. Dyspnea on exertion 3. Super morbid obesity 4. Malignant hypertension 5. OSA on CPAP  PLAN: 1.   Melanie Hess has better BP control, but is not at goal. Will add chlorthalidone 25 mg daily to her regimen. Check BMET in 1 week. She will be fitted for CPAP and I encouraged its use. Hopefully, she can undergo bariatric surgery if she qualifies. Follow-up in hypertension clinic in 3 weeks and with me in 3 months.  Chrystie Nose, MD, [facility] Attending Cardiologist [facility]  Chrystie Nose 10/23/2015, 10:54 AM

## 2015-11-05 ENCOUNTER — Telehealth: Payer: Self-pay | Admitting: Cardiovascular Disease

## 2015-11-05 NOTE — Telephone Encounter (Signed)
Spoke with [facility] and was told information received yesterday Advised patient she should hear from them soon

## 2015-11-05 NOTE — Telephone Encounter (Signed)
New message      Pt is calling to let us know that she never received her CPAP machine or heard from the home health agency.  Please call the home health agency again and let pt know what they said

## 2015-11-08 ENCOUNTER — Emergency Department (HOSPITAL_COMMUNITY): Payer: Medicaid Other

## 2015-11-08 ENCOUNTER — Inpatient Hospital Stay (HOSPITAL_COMMUNITY)
Admission: EM | Admit: 2015-11-08 | Discharge: 2015-11-11 | DRG: 202 | Disposition: A | Discharge status: Home / Self Care | Payer: Medicaid Other | Attending: Internal Medicine | Admitting: Internal Medicine

## 2015-11-08 ENCOUNTER — Encounter (HOSPITAL_COMMUNITY): Payer: Self-pay | Admitting: *Deleted

## 2015-11-08 DIAGNOSIS — R0603 Acute respiratory distress: Secondary | ICD-10-CM | POA: Diagnosis not present

## 2015-11-08 DIAGNOSIS — Z886 Allergy status to analgesic agent status: Secondary | ICD-10-CM | POA: Diagnosis not present

## 2015-11-08 DIAGNOSIS — I10 Essential (primary) hypertension: Secondary | ICD-10-CM | POA: Diagnosis present

## 2015-11-08 DIAGNOSIS — G4733 Obstructive sleep apnea (adult) (pediatric): Secondary | ICD-10-CM

## 2015-11-08 DIAGNOSIS — Z8249 Family history of ischemic heart disease and other diseases of the circulatory system: Secondary | ICD-10-CM

## 2015-11-08 DIAGNOSIS — K219 Gastro-esophageal reflux disease without esophagitis: Secondary | ICD-10-CM | POA: Diagnosis present

## 2015-11-08 DIAGNOSIS — Z808 Family history of malignant neoplasm of other organs or systems: Secondary | ICD-10-CM | POA: Diagnosis not present

## 2015-11-08 DIAGNOSIS — E662 Morbid (severe) obesity with alveolar hypoventilation: Secondary | ICD-10-CM | POA: Diagnosis present

## 2015-11-08 DIAGNOSIS — E8881 Metabolic syndrome: Secondary | ICD-10-CM | POA: Diagnosis present

## 2015-11-08 DIAGNOSIS — Z23 Encounter for immunization: Secondary | ICD-10-CM | POA: Diagnosis not present

## 2015-11-08 DIAGNOSIS — I4581 Long QT syndrome: Secondary | ICD-10-CM | POA: Diagnosis present

## 2015-11-08 DIAGNOSIS — Z880 Allergy status to penicillin: Secondary | ICD-10-CM

## 2015-11-08 DIAGNOSIS — Z885 Allergy status to narcotic agent status: Secondary | ICD-10-CM | POA: Diagnosis not present

## 2015-11-08 DIAGNOSIS — Z6841 Body Mass Index (BMI) 40.0 and over, adult: Secondary | ICD-10-CM

## 2015-11-08 DIAGNOSIS — Z9989 Dependence on other enabling machines and devices: Secondary | ICD-10-CM

## 2015-11-08 DIAGNOSIS — Z803 Family history of malignant neoplasm of breast: Secondary | ICD-10-CM | POA: Diagnosis not present

## 2015-11-08 DIAGNOSIS — Z7984 Long term (current) use of oral hypoglycemic drugs: Secondary | ICD-10-CM | POA: Diagnosis not present

## 2015-11-08 DIAGNOSIS — Z7951 Long term (current) use of inhaled steroids: Secondary | ICD-10-CM

## 2015-11-08 DIAGNOSIS — R0602 Shortness of breath: Secondary | ICD-10-CM | POA: Diagnosis present

## 2015-11-08 DIAGNOSIS — E119 Type 2 diabetes mellitus without complications: Secondary | ICD-10-CM | POA: Diagnosis present

## 2015-11-08 DIAGNOSIS — J9601 Acute respiratory failure with hypoxia: Secondary | ICD-10-CM | POA: Diagnosis present

## 2015-11-08 DIAGNOSIS — J45901 Unspecified asthma with (acute) exacerbation: Principal | ICD-10-CM | POA: Diagnosis present

## 2015-11-08 DIAGNOSIS — R0600 Dyspnea, unspecified: Secondary | ICD-10-CM

## 2015-11-08 LAB — GLUCOSE, CAPILLARY
GLUCOSE-CAPILLARY: 215 mg/dL — AB (ref 65–99)
GLUCOSE-CAPILLARY: 201 mg/dL — AB (ref 65–99)
Glucose-Capillary: 189 mg/dL — ABNORMAL HIGH (ref 65–99)

## 2015-11-08 LAB — BASIC METABOLIC PANEL
Anion gap: 9 (ref 5–15)
BUN: 18 mg/dL (ref 6–20)
CO2: 24 mmol/L (ref 22–32)
CREATININE: 1.14 mg/dL — AB (ref 0.44–1.00)
Calcium: 8.7 mg/dL — ABNORMAL LOW (ref 8.9–10.3)
Chloride: 105 mmol/L (ref 101–111)
GFR calc Af Amer: >60 mL/min (ref >60)
GFR, EST NON AFRICAN AMERICAN: 56 mL/min — AB (ref >60)
Glucose, Bld: 178 mg/dL — ABNORMAL HIGH (ref 65–99)
Potassium: 3.7 mmol/L (ref 3.5–5.1)
SODIUM: 138 mmol/L (ref 135–145)

## 2015-11-08 LAB — CBC WITH DIFFERENTIAL/PLATELET
Basophils Absolute: 0 10*3/uL (ref 0.0–0.1)
Basophils Relative: 0 %
EOS ABS: 0.2 10*3/uL (ref 0.0–0.7)
EOS PCT: 3 %
HCT: 37.2 % (ref 36.0–46.0)
Hemoglobin: 12 g/dL (ref 12.0–15.0)
LYMPHS ABS: 3.4 10*3/uL (ref 0.7–4.0)
Lymphocytes Relative: 36 %
MCH: 28 pg (ref 26.0–34.0)
MCHC: 32.3 g/dL (ref 30.0–36.0)
MCV: 86.9 fL (ref 78.0–100.0)
MONOS PCT: 6 %
Monocytes Absolute: 0.5 10*3/uL (ref 0.1–1.0)
Neutro Abs: 5.2 10*3/uL (ref 1.7–7.7)
Neutrophils Relative %: 55 %
PLATELETS: 266 10*3/uL (ref 150–400)
RBC: 4.28 MIL/uL (ref 3.87–5.11)
RDW: 15.5 % (ref 11.5–15.5)
WBC: 9.3 10*3/uL (ref 4.0–10.5)

## 2015-11-08 LAB — I-STAT ARTERIAL BLOOD GAS, ED
ACID-BASE DEFICIT: 3 mmol/L — AB (ref 0.0–2.0)
Bicarbonate: 22.6 mmol/L (ref 20.0–28.0)
O2 Saturation: 98 %
PH ART: 7.338 — AB (ref 7.350–7.450)
TCO2: 24 mmol/L (ref 0–100)
pCO2 arterial: 42.1 mmHg (ref 32.0–48.0)
pO2, Arterial: 113 mmHg — ABNORMAL HIGH (ref 83.0–108.0)

## 2015-11-08 LAB — CBG MONITORING, ED: Glucose-Capillary: 231 mg/dL — ABNORMAL HIGH (ref 65–99)

## 2015-11-08 LAB — MRSA PCR SCREENING: MRSA BY PCR: NEGATIVE

## 2015-11-08 MED ORDER — IPRATROPIUM-ALBUTEROL 0.5-2.5 (3) MG/3ML IN SOLN
3.0000 mL | RESPIRATORY_TRACT | Status: DC
  Administered 2015-11-08 (×2): 3 mL via RESPIRATORY_TRACT
  Filled 2015-11-08: qty 3

## 2015-11-08 MED ORDER — INSULIN ASPART 100 UNIT/ML ~~LOC~~ SOLN
0.0000 [IU] | Freq: Every day | SUBCUTANEOUS | Status: DC
  Administered 2015-11-08: 2 [IU] via SUBCUTANEOUS

## 2015-11-08 MED ORDER — INSULIN ASPART 100 UNIT/ML ~~LOC~~ SOLN
0.0000 [IU] | Freq: Three times a day (TID) | SUBCUTANEOUS | Status: DC
  Administered 2015-11-08: 3 [IU] via SUBCUTANEOUS
  Administered 2015-11-08: 5 [IU] via SUBCUTANEOUS

## 2015-11-08 MED ORDER — MAGNESIUM SULFATE 2 GM/50ML IV SOLN
2.0000 g | Freq: Once | INTRAVENOUS | Status: AC
  Administered 2015-11-08: 2 g via INTRAVENOUS
  Filled 2015-11-08: qty 50

## 2015-11-08 MED ORDER — IPRATROPIUM-ALBUTEROL 0.5-2.5 (3) MG/3ML IN SOLN
3.0000 mL | Freq: Once | RESPIRATORY_TRACT | Status: AC
  Administered 2015-11-08: 3 mL via RESPIRATORY_TRACT

## 2015-11-08 MED ORDER — ALBUTEROL SULFATE (2.5 MG/3ML) 0.083% IN NEBU
INHALATION_SOLUTION | RESPIRATORY_TRACT | Status: AC
  Administered 2015-11-08: 2.5 mg
  Filled 2015-11-08: qty 3

## 2015-11-08 MED ORDER — PREDNISONE 20 MG PO TABS: 50.0000 mg | ORAL_TABLET | Freq: Every day | ORAL | Status: DC

## 2015-11-08 MED ORDER — METHYLPREDNISOLONE SODIUM SUCC 125 MG IJ SOLR
60.0000 mg | Freq: Two times a day (BID) | INTRAMUSCULAR | Status: DC
  Administered 2015-11-08 – 2015-11-09 (×2): 60 mg via INTRAVENOUS
  Filled 2015-11-08 (×2): qty 2

## 2015-11-08 MED ORDER — IPRATROPIUM BROMIDE 0.02 % IN SOLN
1.0000 mg | Freq: Once | RESPIRATORY_TRACT | Status: AC
  Administered 2015-11-08: 1 mg via RESPIRATORY_TRACT

## 2015-11-08 MED ORDER — ALBUTEROL SULFATE (2.5 MG/3ML) 0.083% IN NEBU: 2.5000 mg | INHALATION_SOLUTION | Freq: Two times a day (BID) | RESPIRATORY_TRACT | Status: DC | PRN

## 2015-11-08 MED ORDER — AMLODIPINE BESYLATE 10 MG PO TABS
10.0000 mg | ORAL_TABLET | Freq: Every day | ORAL | Status: DC
  Administered 2015-11-09 – 2015-11-11 (×3): 10 mg via ORAL
  Filled 2015-11-08 (×3): qty 1

## 2015-11-08 MED ORDER — IRBESARTAN 300 MG PO TABS
300.0000 mg | ORAL_TABLET | Freq: Every day | ORAL | Status: DC
  Administered 2015-11-09 – 2015-11-11 (×3): 300 mg via ORAL
  Filled 2015-11-08 (×3): qty 1

## 2015-11-08 MED ORDER — IPRATROPIUM-ALBUTEROL 0.5-2.5 (3) MG/3ML IN SOLN
RESPIRATORY_TRACT | Status: AC
  Administered 2015-11-08: 3 mL via RESPIRATORY_TRACT
  Filled 2015-11-08: qty 3

## 2015-11-08 MED ORDER — ALBUTEROL SULFATE (2.5 MG/3ML) 0.083% IN NEBU
2.5000 mg | INHALATION_SOLUTION | Freq: Four times a day (QID) | RESPIRATORY_TRACT | Status: DC | PRN
Start: 2015-11-08 — End: 2015-11-11
  Administered 2015-11-09: 2.5 mg via RESPIRATORY_TRACT
  Filled 2015-11-08 (×2): qty 3

## 2015-11-08 MED ORDER — IPRATROPIUM BROMIDE 0.02 % IN SOLN
RESPIRATORY_TRACT | Status: AC
  Filled 2015-11-08: qty 2.5

## 2015-11-08 MED ORDER — ALBUTEROL (5 MG/ML) CONTINUOUS INHALATION SOLN
15.0000 mg/h | INHALATION_SOLUTION | Freq: Once | RESPIRATORY_TRACT | Status: AC
  Administered 2015-11-08: 15 mg/h via RESPIRATORY_TRACT

## 2015-11-08 MED ORDER — ALBUTEROL (5 MG/ML) CONTINUOUS INHALATION SOLN
20.0000 mg/h | INHALATION_SOLUTION | Freq: Once | RESPIRATORY_TRACT | Status: AC
  Administered 2015-11-08: 20 mg/h via RESPIRATORY_TRACT

## 2015-11-08 MED ORDER — FLUTICASONE FUROATE-VILANTEROL 100-25 MCG/INH IN AEPB
1.0000 | INHALATION_SPRAY | Freq: Every day | RESPIRATORY_TRACT | Status: DC
  Administered 2015-11-10 – 2015-11-11 (×2): 1 via RESPIRATORY_TRACT
  Filled 2015-11-08 (×3): qty 28

## 2015-11-08 MED ORDER — METHYLPREDNISOLONE SODIUM SUCC 125 MG IJ SOLR
125.0000 mg | Freq: Once | INTRAMUSCULAR | Status: AC
  Administered 2015-11-08: 125 mg via INTRAVENOUS
  Filled 2015-11-08: qty 2

## 2015-11-08 MED ORDER — METFORMIN HCL ER 500 MG PO TB24
1000.0000 mg | ORAL_TABLET | Freq: Two times a day (BID) | ORAL | Status: DC
  Administered 2015-11-08 – 2015-11-11 (×5): 1000 mg via ORAL
  Filled 2015-11-08 (×8): qty 2

## 2015-11-08 MED ORDER — INFLUENZA VAC SPLIT QUAD 0.5 ML IM SUSY
0.5000 mL | PREFILLED_SYRINGE | INTRAMUSCULAR | Status: AC
  Administered 2015-11-09: 0.5 mL via INTRAMUSCULAR
  Filled 2015-11-08: qty 0.5

## 2015-11-08 MED ORDER — PNEUMOCOCCAL VAC POLYVALENT 25 MCG/0.5ML IJ INJ
0.5000 mL | INJECTION | INTRAMUSCULAR | Status: AC
  Administered 2015-11-09: 0.5 mL via INTRAMUSCULAR
  Filled 2015-11-08: qty 0.5

## 2015-11-08 MED ORDER — ENOXAPARIN SODIUM 100 MG/ML ~~LOC~~ SOLN
90.0000 mg | SUBCUTANEOUS | Status: DC
  Administered 2015-11-08 – 2015-11-10 (×3): 90 mg via SUBCUTANEOUS
  Filled 2015-11-08 (×3): qty 1

## 2015-11-08 MED ORDER — IPRATROPIUM-ALBUTEROL 0.5-2.5 (3) MG/3ML IN SOLN
RESPIRATORY_TRACT | Status: AC
  Filled 2015-11-08: qty 3

## 2015-11-08 MED ORDER — ACETAMINOPHEN 500 MG PO TABS
1000.0000 mg | ORAL_TABLET | Freq: Four times a day (QID) | ORAL | Status: DC | PRN
  Filled 2015-11-08: qty 2

## 2015-11-08 MED ORDER — CHLORTHALIDONE 25 MG PO TABS
25.0000 mg | ORAL_TABLET | Freq: Every day | ORAL | Status: DC
  Administered 2015-11-09 – 2015-11-11 (×3): 25 mg via ORAL
  Filled 2015-11-08 (×3): qty 1

## 2015-11-08 MED ORDER — LISINOPRIL 40 MG PO TABS: 40.0000 mg | ORAL_TABLET | Freq: Every day | ORAL | Status: DC

## 2015-11-08 NOTE — Progress Notes (Signed)
Gave patient peak flow due to patient coming to hospital for difficulty breathing and having a history of asthma.  Patient performed peak flow, with good effort, and achieved goal of 130.

## 2015-11-08 NOTE — ED Triage Notes (Signed)
Pt reports having an asthma attack onset since 9pm. Pt has used multiple albuterol treatments and inhaler at home without improvement. Pt had chest tightness that has since resolved. Pt is unable to lie flat, is sitting on the side of the bed.

## 2015-11-08 NOTE — ED Notes (Signed)
RT at bedside to place bipap.

## 2015-11-08 NOTE — ED Notes (Signed)
Pt called out from room with labored breathing and diminished lung sounds. Dr.Ward at bedside. RT called for repeat continuous neb

## 2015-11-08 NOTE — ED Notes (Signed)
Paged dr. Darnelle Catalanama regarding pt. Daily PO meds while on bipap. Per MD ok to hold meds until pt. Is weaned from bipap.

## 2015-11-08 NOTE — H&P (Signed)
History and Physical    Melanie Hess ZOX:[id number] DOB: 1970-06-03 DOA: 11/08/2015   PCP: Karie ChimeraEESE,BETTI D, MD Chief Complaint:  Chief Complaint  Patient presents with  . Asthma  . Shortness of Breath    HPI: Melanie Hess is a 45 y.o. female with medical history significant of morbid obesity, malignant HTN, DM2, asthma.  Patient presents to the ED in acute respiratory distress with severe asthma exacerbation.  Home neb treatments with albuterol havent helped.  Not currently on steroids.  No prior cough, chest pain, fevers.  Exacerbation onset 2 days ago and became much worse tonight.  ED Course: Very wheezy, initially improved after hour long neb and solumedrol, breathing worse again.  Now on 2nd hour long neb, getting mag, getting ABG.  Review of Systems: As per HPI otherwise 10 point review of systems negative.    Past Medical History:  Diagnosis Date  . Ankle fracture, right 2001  . Anxiety   . Arthritis   . Asthma   . Carpal tunnel syndrome    Bilateral  . Diabetes mellitus without complication (HCC)    Type II  . GERD (gastroesophageal reflux disease)   . Hypertension   . Migraine   . OSA (obstructive sleep apnea)   . Plantar fasciitis of right foot   . Shortness of breath dyspnea    with walking short distances    Past Surgical History:  Procedure Laterality Date  . CARPAL TUNNEL RELEASE Left   . HEEL SPUR RESECTION Right 08/01/2015   Procedure: HEEL SPUR EXCISIONS;  Surgeon: Marcene CorningPeter Dalldorf, MD;  [facility];  Service: Orthopedics;  Laterality: Right;  . TUBAL LIGATION       reports that she has never smoked. She has never used smokeless tobacco. She reports that she does not drink alcohol or use drugs.  Allergies  Allergen Reactions  . Aspirin Shortness Of Breath and Swelling  . Nsaids Shortness Of Breath and Swelling  . Penicillins Swelling, Rash and Other (See Comments)    PCN reaction causing immediate rash, facial/tongue/throat  swelling, SOB or lightheadedness with hypotension: YES PCN reaction causing severe rash involving mucus membranes or skin necrosis: YES PCN reaction that required hospitalization NO PCN reaction occurring within the last 10 years: NO   . Percocet [Oxycodone-Acetaminophen] Hives and Itching  . Vicodin [Hydrocodone-Acetaminophen] Hives and Itching  . Tramadol Nausea Only    Family History  Problem Relation Age of Onset  . Hypertension Mother   . Breast cancer Maternal Aunt   . Brain cancer Cousin       Prior to Admission medications   Medication Sig Start Date End Date Taking? Authorizing Provider  acetaminophen (TYLENOL) 500 MG tablet Take 2 tablets (1,000 mg total) by mouth every 6 (six) hours as needed. Patient taking differently: Take 1,500 mg by mouth 2 (two) times daily as needed for mild pain (for migraine).  05/29/15  Yes Ace GinsSerena Y Sam, PA-C  albuterol (PROVENTIL HFA;VENTOLIN HFA) 108 (90 Base) MCG/ACT inhaler Inhale 2 puffs into the lungs every 6 (six) hours as needed for wheezing or shortness of breath. 09/09/15  Yes Kalman ShanMurali Ramaswamy, MD  albuterol (PROVENTIL) (2.5 MG/3ML) 0.083% nebulizer solution Take 3 mLs (2.5 mg total) by nebulization 2 (two) times daily as needed for wheezing or shortness of breath. 09/03/15  Yes Tammy S Parrett, NP  amLODipine (NORVASC) 10 MG tablet Take 1 tablet (10 mg total) by mouth daily. 07/29/15  Yes Chrystie NoseKenneth C Hilty, MD  chlorthalidone (HYGROTON) 25  MG tablet Take 1 tablet (25 mg total) by mouth daily. 10/23/15 01/21/16 Yes Chrystie Nose, MD  fluticasone furoate-vilanterol (BREO ELLIPTA) 100-25 MCG/INH AEPB Inhale 1 puff into the lungs daily. 09/03/15  Yes Tammy S Parrett, NP  lisinopril (PRINIVIL,ZESTRIL) 40 MG tablet Take 1 tablet (40 mg total) by mouth daily. 07/29/15  Yes Chrystie Nose, MD  metFORMIN (GLUCOPHAGE-XR) 500 MG 24 hr tablet Take 1,000 mg by mouth 2 (two) times daily. 12/20/14  Yes Historical Provider, MD  olmesartan (BENICAR) 40 MG tablet  Take 40 mg by mouth daily.   Yes Historical Provider, MD    Physical Exam: Vitals:   11/08/15 1610 11/08/15 0355 11/08/15 0428 11/08/15 0532  BP: 150/97     Pulse: 90     Resp: (!) 32     Temp:   98.4 F (36.9 C)   TempSrc:   Axillary   SpO2: 99% 100%  100%      Constitutional: NAD, calm, comfortable Eyes: PERRL, lids and conjunctivae normal ENMT: Mucous membranes are moist. Posterior pharynx clear of any exudate or lesions.Normal dentition.  Neck: normal, supple, no masses, no thyromegaly Respiratory: Wheezing audible from across the room, tachypnea and accessory muscle use. Cardiovascular: Regular rate and rhythm, no murmurs / rubs / gallops. No extremity edema. 2+ pedal pulses. No carotid bruits.  Abdomen: no tenderness, no masses palpated. No hepatosplenomegaly. Bowel sounds positive.  Musculoskeletal: no clubbing / cyanosis. No joint deformity upper and lower extremities. Good ROM, no contractures. Normal muscle tone.  Skin: no rashes, lesions, ulcers. No induration Neurologic: CN 2-12 grossly intact. Sensation intact, DTR normal. Strength 5/5 in all 4.  Psychiatric: Normal judgment and insight. Alert and oriented x 3. Normal mood.    Labs on Admission: I have personally reviewed following labs and imaging studies  CBC:  Recent Labs Lab 11/08/15 0415  WBC 9.3  NEUTROABS 5.2  HGB 12.0  HCT 37.2  MCV 86.9  PLT 266   Basic Metabolic Panel:  Recent Labs Lab 11/08/15 0415  NA 138  K 3.7  CL 105  CO2 24  GLUCOSE 178*  BUN 18  CREATININE 1.14*  CALCIUM 8.7*   GFR: CrCl cannot be calculated (Unknown ideal weight.). Liver Function Tests: No results for input(s): AST, ALT, ALKPHOS, BILITOT, PROT, ALBUMIN in the last 168 hours. No results for input(s): LIPASE, AMYLASE in the last 168 hours. No results for input(s): AMMONIA in the last 168 hours. Coagulation Profile: No results for input(s): INR, PROTIME in the last 168 hours. Cardiac Enzymes: No results  for input(s): CKTOTAL, CKMB, CKMBINDEX, TROPONINI in the last 168 hours. BNP (last 3 results) No results for input(s): PROBNP in the last 8760 hours. HbA1C: No results for input(s): HGBA1C in the last 72 hours. CBG: No results for input(s): GLUCAP in the last 168 hours. Lipid Profile: No results for input(s): CHOL, HDL, LDLCALC, TRIG, CHOLHDL, LDLDIRECT in the last 72 hours. Thyroid Function Tests: No results for input(s): TSH, T4TOTAL, FREET4, T3FREE, THYROIDAB in the last 72 hours. Anemia Panel: No results for input(s): VITAMINB12, FOLATE, FERRITIN, TIBC, IRON, RETICCTPCT in the last 72 hours. Urine analysis:    Component Value Date/Time   COLORURINE YELLOW 07/14/2009 1644   APPEARANCEUR CLEAR 07/14/2009 1644   LABSPEC 1.028 07/14/2009 1644   PHURINE 6.5 07/14/2009 1644   GLUCOSEU NEGATIVE 07/14/2009 1644   HGBUR NEGATIVE 07/14/2009 1644   BILIRUBINUR NEGATIVE 07/14/2009 1644   KETONESUR NEGATIVE 07/14/2009 1644   PROTEINUR NEGATIVE 07/14/2009 1644  UROBILINOGEN 1.0 07/14/2009 1644   NITRITE NEGATIVE 07/14/2009 1644   LEUKOCYTESUR  07/14/2009 1644    NEGATIVE MICROSCOPIC NOT DONE ON URINES WITH NEGATIVE PROTEIN, BLOOD, LEUKOCYTES, NITRITE, OR GLUCOSE <1000 mg/dL.   Sepsis Labs: @LABRCNTIP (procalcitonin:4,lacticidven:4) )No results found for this or any previous visit (from the past 240 hour(s)).   Radiological Exams on Admission: Dg Chest Portable 1 View  Result Date: 11/08/2015 CLINICAL DATA:  Shortness of breath and chest tightness beginning at 9 p.m. History of asthma. EXAM: PORTABLE CHEST 1 VIEW COMPARISON:  Chest radiograph October 18, 2014 FINDINGS: Habitus limited examination. The heart size and mediastinal contours are within normal limits. Both lungs are clear. The visualized skeletal structures are unremarkable. IMPRESSION: No acute cardiopulmonary process on this habitus limited examination. Electronically Signed   By: Awilda Metro M.D.   On: 11/08/2015  04:11    EKG: Independently reviewed.  Assessment/Plan Principal Problem:   Asthma exacerbation Active Problems:   Diabetes mellitus type 2, controlled, without complications (HCC)   Acute respiratory distress   Acute respiratory failure with hypoxia (HCC)    1. Asthma exacerbation - 1. Adult wheeze protocol 2. Currently on 2nd hour long neb 3. Going to SDU 4. ABG dosent look that bad at all at the moment 5. Solumedrol BID 6. Getting mag right now in ED 7. satting 98% on NRB 2. DM2 - 1. Continue home metformin for now 2. Check CBGs AC/HS while on solumedrol, may need to add SSI if these elevate   DVT prophylaxis: Lovenox Code Status: Full Family Communication: Family at bedside Consults called: None Admission status: Admit to inpatient   Hillary Bow DO [facility] Pager [phone] from 7PM-7AM  If 7AM-7PM, please contact the day physician for the patient [email or web address] Password [email or web address]  11/08/2015, 5:45 AM

## 2015-11-08 NOTE — ED Notes (Signed)
Pt. Assisted with using bed side commode. Pt. SOB with movement to bedside commode, bedside cleaning done and pt. Assisted back to bed. Pt. Resting comfortably with bipap in bed at this time.

## 2015-11-08 NOTE — ED Provider Notes (Addendum)
TIME SEEN: 3:40 AM  CHIEF COMPLAINT: Asthma exacerbation  HPI: Pt is a 45 y.o. female with history of obesity, diabetes, hypertension, asthma who presents to the emergency department with asthma exacerbation for the past 2 days. Has been using breathing treatments at home without much relief. Has not been on steroids. Denies fevers, cough, chest pain. Does feel short of breath. No lower extremity swelling or pain. No history of PE or DVT.  ROS: See HPI Constitutional: no fever  Eyes: no drainage  ENT: no runny nose   Cardiovascular:  no chest pain  Resp:  SOB  GI: no vomiting GU: no dysuria Integumentary: no rash  Allergy: no hives  Musculoskeletal: no leg swelling  Neurological: no slurred speech ROS otherwise negative  PAST MEDICAL HISTORY/PAST SURGICAL HISTORY:  Past Medical History:  Diagnosis Date  . Ankle fracture, right 2001  . Anxiety   . Arthritis   . Asthma   . Carpal tunnel syndrome    Bilateral  . Diabetes mellitus without complication (HCC)    Type II  . GERD (gastroesophageal reflux disease)   . Hypertension   . Migraine   . OSA (obstructive sleep apnea)   . Plantar fasciitis of right foot   . Shortness of breath dyspnea    with walking short distances    MEDICATIONS:  Prior to Admission medications   Medication Sig Start Date End Date Taking? Authorizing Provider  acetaminophen (TYLENOL) 500 MG tablet Take 2 tablets (1,000 mg total) by mouth every 6 (six) hours as needed. Patient taking differently: Take 1,500 mg by mouth 2 (two) times daily as needed for mild pain (for migraine).  05/29/15   Ace Gins Sam, PA-C  acetaminophen-codeine (TYLENOL #3) 300-30 MG tablet Take 1-2 tablets by mouth every 6 (six) hours as needed for moderate pain. 08/02/15   Elodia Florence, PA-C  albuterol (PROVENTIL HFA;VENTOLIN HFA) 108 (90 Base) MCG/ACT inhaler Inhale 2 puffs into the lungs every 6 (six) hours as needed for wheezing or shortness of breath. 09/09/15   Kalman Shan,  MD  albuterol (PROVENTIL) (2.5 MG/3ML) 0.083% nebulizer solution Take 3 mLs (2.5 mg total) by nebulization 2 (two) times daily as needed for wheezing or shortness of breath. 09/03/15   Tammy S Parrett, NP  amLODipine (NORVASC) 10 MG tablet Take 1 tablet (10 mg total) by mouth daily. 07/29/15   Chrystie Nose, MD  chlorthalidone (HYGROTON) 25 MG tablet Take 1 tablet (25 mg total) by mouth daily. 10/23/15 01/21/16  Chrystie Nose, MD  fluticasone furoate-vilanterol (BREO ELLIPTA) 100-25 MCG/INH AEPB Inhale 1 puff into the lungs daily. 09/03/15   Tammy S Parrett, NP  lisinopril (PRINIVIL,ZESTRIL) 40 MG tablet Take 1 tablet (40 mg total) by mouth daily. 07/29/15   Chrystie Nose, MD  metFORMIN (GLUCOPHAGE-XR) 500 MG 24 hr tablet Take 1,000 mg by mouth 2 (two) times daily. 12/20/14   Historical Provider, MD  methocarbamol (ROBAXIN) 500 MG tablet Take 1 tablet (500 mg total) by mouth every 6 (six) hours as needed for muscle spasms. 08/02/15   Elodia Florence, PA-C  OLMESARTAN MEDOXOMIL PO Take 40 mg by mouth daily.    Historical Provider, MD  predniSONE (DELTASONE) 10 MG tablet 40 mg x1 day, 30 mg x1 day, 20 mg x1 day, 10 mg x1 day, 5 mg x1 day and stop 09/09/15   Kalman Shan, MD  promethazine (PHENERGAN) 12.5 MG tablet Take 1-2 tablets (12.5-25 mg total) by mouth every 6 (six) hours as needed for nausea  or vomiting. 08/02/15   Elodia FlorenceAndrew Nida, PA-C    ALLERGIES:  Allergies  Allergen Reactions  . Aspirin Shortness Of Breath and Swelling  . Nsaids Shortness Of Breath and Swelling  . Penicillins Swelling, Rash and Other (See Comments)    PCN reaction causing immediate rash, facial/tongue/throat swelling, SOB or lightheadedness with hypotension: YES PCN reaction causing severe rash involving mucus membranes or skin necrosis: YES PCN reaction that required hospitalization NO PCN reaction occurring within the last 10 years: NO   . Percocet [Oxycodone-Acetaminophen] Hives and Itching  . Vicodin  [Hydrocodone-Acetaminophen] Hives and Itching  . Tramadol Nausea Only    SOCIAL HISTORY:  Social History  Substance Use Topics  . Smoking status: Never Smoker  . Smokeless tobacco: Never Used     Comment: years ago may have smoked 1 cig or less a week  . Alcohol use No    FAMILY HISTORY: Family History  Problem Relation Age of Onset  . Hypertension Mother   . Breast cancer Maternal Aunt   . Brain cancer Cousin     EXAM: BP 150/97 (BP Location: Left Arm)   Pulse 90   Resp (!) 32   SpO2 100%  CONSTITUTIONAL: Alert and oriented and responds appropriately to questions. Afebrile and nontoxic, obese HEAD: Normocephalic EYES: Conjunctivae clear, PERRL ENT: normal nose; no rhinorrhea; moist mucous membranes NECK: Supple, no meningismus, no LAD  CARD: RRR; S1 and S2 appreciated; no murmurs, no clicks, no rubs, no gallops RESP: Normal chest excursion without splinting, patient is tachypneic, diminished breath sounds diffusely with expiratory wheezing, no rhonchi or rales, no hypoxia, patient does have slightly increased work of breathing and speaking short sentences ABD/GI: Normal bowel sounds; non-distended; soft, non-tender, no rebound, no guarding, no peritoneal signs BACK:  The back appears normal and is non-tender to palpation, there is no CVA tenderness EXT: Normal ROM in all joints; non-tender to palpation; no edema; normal capillary refill; no cyanosis, no calf tenderness or swelling    SKIN: Normal color for age and race; warm; no rash NEURO: Moves all extremities equally, sensation to light touch intact diffusely, cranial nerves II through XII intact PSYCH: The patient's mood and manner are appropriate. Grooming and personal hygiene are appropriate.  MEDICAL DECISION MAKING: Patient here with asthma exacerbation. We'll give continuous albuterol, Atrovent, Solu-Medrol. We'll obtain labs, chest x-ray. EKG shows no new ischemic abnormality.  ED PROGRESS: 5:15 AM  Pt Has normal  labs, and clear chest x-ray. Still wheezing significant delay with very diminished aeration but no hypoxia or cigarette respiratory distress. We'll give another continuous epidural treatment, IV magnesium and obtain an ABG. This time I feel she will need admission. She has been admitted to hospital before for her asthma but never intubated. She is still speaking full sentences. PCP is with [facility].   5:25 AM  Discussed patient's case with hospitalist, Dr. Julian ReilGardner.  Recommend admission to inpatient, stepdown bed.  I will place holding orders per their request. Patient and family (if present) updated with plan. Care transferred to hospitalist service.   5:50 AM  Pt has a reassuring ABG. At this time I do not feel she needs BiPAP. Dr. Julian ReilGardner agrees. She is getting her second continuous treatment at this time. Awaiting stepdown bed.  I reviewed all nursing notes, vitals, pertinent old records, EKGs, labs, imaging (as available).    EKG Interpretation  Date/Time:  Saturday November 08 2015 03:51:22 EDT Ventricular Rate:  94 PR Interval:    QRS Duration: 111  QT Interval:  407 QTC Calculation: 509 R Axis:   20 Text Interpretation:  Sinus rhythm Low voltage, precordial leads Abnormal R-wave progression, early transition Borderline prolonged QT interval No significant change since last tracing Confirmed by Winnifred Dufford,  DO, Mitchael Luckey ([id number]) on 11/08/2015 3:53:45 AM       CRITICAL CARE Performed by: Raelyn Number   Total critical care time: 45 minutes  Critical care time was exclusive of separately billable procedures and treating other patients.  Critical care was necessary to treat or prevent imminent or life-threatening deterioration.  Critical care was time spent personally by me on the following activities: development of treatment plan with patient and/or surrogate as well as nursing, discussions with consultants, evaluation of patient's response to treatment, examination of patient,  obtaining history from patient or surrogate, ordering and performing treatments and interventions, ordering and review of laboratory studies, ordering and review of radiographic studies, pulse oximetry and re-evaluation of patient's condition.    Layla Maw Stevie Charter, DO 11/08/15 6045    Layla Maw Jeanny Rymer, DO 11/08/15 4098    Layla Maw Selah Klang, DO 11/08/15 1191

## 2015-11-08 NOTE — Progress Notes (Addendum)
Progress Note    Melanie Hess  XBJ:[id number] DOB: 1970/07/14  DOA: 11/08/2015 PCP: Karie ChimeraEESE,BETTI D, MD    Brief Narrative:   Chief complaint: Follow-up asthma exacerbation.  Melanie Hess is an 45 y.o. female with a PMH of morbid obesity, malignant hypertension, type 2 diabetes and asthma who was admitted 11/08/15 with an asthma exacerbation.  Assessment/Plan:   Principal Problem:   Asthma exacerbation/Acute respiratory distress/acute respiratory failure with hypoxia Required BiPAP overnight, and remains on BiPAP now. We'll attempt to wean. Given IV magnesium. Continue Solu-Medrol, bronchodilators, Breo Ellipta.  Active Problems:   Morbid obesity She is pursuing bariatric surgery and has an appointment this month.    OSA Reviewed Dr. Blanchie DessertHilty's notes from 10/23/15: Sleep study performed 09/15/15: AHI of 66.7/H indicating Moderate to severe apnea. She was prescribed CPAP with a trial of 19 cm H2O. She has not yet been fitted with a CPAP mask. I will consult with the case manager to see about getting her a CPAP with the recommended settings.    Hypertension Continue Norvasc, Avapro and chlorthalidone. Discontinue lisinopril secondary to cough.    Diabetes mellitus type 2, controlled, without complications (HCC) Continue metformin. Add sliding scale insulin while on steroids.   Family Communication/Anticipated D/C date and plan/Code Status   DVT prophylaxis: Lovenox ordered. Code Status: Full Code.  Family Communication: No family at the bedside. Disposition Plan: Home when respiratory status stable, likely several more days.   Medical Consultants:    None.   Procedures:    None  Anti-Infectives:    None  Subjective:   The patient reports that She continues to be short of breath and has some chest tightness. Review of symptoms is positive for dry cough and negative for nausea/vomiting.  Objective:    Vitals:   11/08/15 0630 11/08/15 0700  11/08/15 0726 11/08/15 0730  BP: (!) 162/104 131/70 155/78 148/86  Pulse: 94 95 93 95  Resp: 25 (!) 28 24 18   Temp:      TempSrc:      SpO2: 97% 96% 99% 98%   No intake or output data in the 24 hours ending 11/08/15 0909 There were no vitals filed for this visit.  Exam: General exam: Appears Mildly uncomfortable with BiPAP on. Respiratory system: Meniscus, more so on the right with a few faint wheezes. BiPAP on. Cardiovascular system: S1 & S2 heard, RRR. No JVD,  rubs, gallops or clicks. No murmurs. Gastrointestinal system: Abdomen is obese, soft and nontender. No organomegaly or masses felt. Normal bowel sounds heard. Central nervous system: Alert and oriented. No focal neurological deficits. Extremities: No clubbing,  or cyanosis. No edema. Skin: No rashes, lesions or ulcers. Psychiatry: Judgement and insight appear normal. Mood & affect appropriate.   Data Reviewed:   I have personally reviewed following labs and imaging studies:  Labs: Basic Metabolic Panel:  Recent Labs Lab 11/08/15 0415  NA 138  K 3.7  CL 105  CO2 24  GLUCOSE 178*  BUN 18  CREATININE 1.14*  CALCIUM 8.7*   GFR CrCl cannot be calculated (Unknown ideal weight.). Liver Function Tests: No results for input(s): AST, ALT, ALKPHOS, BILITOT, PROT, ALBUMIN in the last 168 hours. No results for input(s): LIPASE, AMYLASE in the last 168 hours. No results for input(s): AMMONIA in the last 168 hours. Coagulation profile No results for input(s): INR, PROTIME in the last 168 hours.  CBC:  Recent Labs Lab 11/08/15 0415  WBC 9.3  NEUTROABS 5.2  HGB 12.0  HCT 37.2  MCV 86.9  PLT 266   CBG:  Recent Labs Lab 11/08/15 0758  GLUCAP 231*    Microbiology No results found for this or any previous visit (from the past 240 hour(s)).  Radiology: Dg Chest Portable 1 View  Result Date: 11/08/2015 CLINICAL DATA:  Shortness of breath and chest tightness beginning at 9 p.m. History of asthma. EXAM:  PORTABLE CHEST 1 VIEW COMPARISON:  Chest radiograph October 18, 2014 FINDINGS: Habitus limited examination. The heart size and mediastinal contours are within normal limits. Both lungs are clear. The visualized skeletal structures are unremarkable. IMPRESSION: No acute cardiopulmonary process on this habitus limited examination. Electronically Signed   By: Awilda Metro M.D.   On: 11/08/2015 04:11    Medications:   . albuterol      . amLODipine  10 mg Oral Daily  . chlorthalidone  25 mg Oral Daily  . enoxaparin (LOVENOX) injection  40 mg Subcutaneous Q24H  . fluticasone furoate-vilanterol  1 puff Inhalation Daily  . ipratropium      . irbesartan  300 mg Oral Daily  . lisinopril  40 mg Oral Daily  . metFORMIN  1,000 mg Oral BID WC  . methylPREDNISolone (SOLU-MEDROL) injection  60 mg Intravenous Q12H   Continuous Infusions:   Medical decision making is of high complexity and this patient is at high risk of deterioration, therefore this is a level 3 visit.    LOS: 0 days   RAMA,CHRISTINA  [facility] Pager [phone]. If unable to reach me by pager, please call my cell phone at [phone].  *Please refer to [email or web address], password [email or web address] to get updated schedule on who will round on this patient, as hospitalists switch teams weekly. If 7PM-7AM, please contact night-coverage at [email or web address], password [email or web address] for any overnight needs.  11/08/2015, 9:09 AM

## 2015-11-09 LAB — CBC
HEMATOCRIT: 35.4 % — AB (ref 36.0–46.0)
Hemoglobin: 11.5 g/dL — ABNORMAL LOW (ref 12.0–15.0)
MCH: 27.9 pg (ref 26.0–34.0)
MCHC: 32.5 g/dL (ref 30.0–36.0)
MCV: 85.9 fL (ref 78.0–100.0)
PLATELETS: 283 10*3/uL (ref 150–400)
RBC: 4.12 MIL/uL (ref 3.87–5.11)
RDW: 15.7 % — AB (ref 11.5–15.5)
WBC: 11.9 10*3/uL — AB (ref 4.0–10.5)

## 2015-11-09 LAB — GLUCOSE, CAPILLARY
GLUCOSE-CAPILLARY: 226 mg/dL — AB (ref 65–99)
Glucose-Capillary: 223 mg/dL — ABNORMAL HIGH (ref 65–99)
Glucose-Capillary: 209 mg/dL — ABNORMAL HIGH (ref 65–99)
Glucose-Capillary: 152 mg/dL — ABNORMAL HIGH (ref 65–99)

## 2015-11-09 LAB — BASIC METABOLIC PANEL
Anion gap: 9 (ref 5–15)
BUN: 16 mg/dL (ref 6–20)
CHLORIDE: 108 mmol/L (ref 101–111)
CO2: 23 mmol/L (ref 22–32)
CREATININE: 1 mg/dL (ref 0.44–1.00)
Calcium: 8.8 mg/dL — ABNORMAL LOW (ref 8.9–10.3)
GFR calc Af Amer: >60 mL/min (ref >60)
GFR calc non Af Amer: >60 mL/min (ref >60)
GLUCOSE: 182 mg/dL — AB (ref 65–99)
POTASSIUM: 4.4 mmol/L (ref 3.5–5.1)
SODIUM: 140 mmol/L (ref 135–145)

## 2015-11-09 MED ORDER — INSULIN ASPART 100 UNIT/ML ~~LOC~~ SOLN
0.0000 [IU] | Freq: Three times a day (TID) | SUBCUTANEOUS | Status: DC
  Administered 2015-11-09: 4 [IU] via SUBCUTANEOUS
  Administered 2015-11-09 – 2015-11-10 (×2): 7 [IU] via SUBCUTANEOUS

## 2015-11-09 MED ORDER — IPRATROPIUM-ALBUTEROL 0.5-2.5 (3) MG/3ML IN SOLN
3.0000 mL | Freq: Three times a day (TID) | RESPIRATORY_TRACT | Status: DC
  Administered 2015-11-09 – 2015-11-11 (×5): 3 mL via RESPIRATORY_TRACT
  Filled 2015-11-09 (×5): qty 3

## 2015-11-09 MED ORDER — METHYLPREDNISOLONE SODIUM SUCC 125 MG IJ SOLR: 60.0000 mg | INTRAMUSCULAR | Status: DC

## 2015-11-09 MED ORDER — IPRATROPIUM-ALBUTEROL 0.5-2.5 (3) MG/3ML IN SOLN
3.0000 mL | Freq: Four times a day (QID) | RESPIRATORY_TRACT | Status: DC
  Administered 2015-11-09 (×2): 3 mL via RESPIRATORY_TRACT
  Filled 2015-11-09 (×3): qty 3

## 2015-11-09 MED ORDER — PREDNISONE 50 MG PO TABS
60.0000 mg | ORAL_TABLET | Freq: Every day | ORAL | Status: DC
  Administered 2015-11-09 – 2015-11-11 (×3): 60 mg via ORAL
  Filled 2015-11-09 (×2): qty 1
  Filled 2015-11-09: qty 3

## 2015-11-09 MED ORDER — PREDNISONE 20 MG PO TABS: 60.0000 mg | ORAL_TABLET | Freq: Every day | ORAL | Status: DC

## 2015-11-09 MED ORDER — INSULIN ASPART 100 UNIT/ML ~~LOC~~ SOLN
0.0000 [IU] | Freq: Every day | SUBCUTANEOUS | Status: DC
  Administered 2015-11-10: 2 [IU] via SUBCUTANEOUS

## 2015-11-09 NOTE — H&P (Signed)
RT note: Pt states she gets winded when getting up to the bathroom. May need to be assessed for home O2.

## 2015-11-09 NOTE — Progress Notes (Addendum)
Progress Note    Melanie Hess  [id number] DOB: 01/10/1971  DOA: 11/08/2015 PCP: Melanie Hess    Brief Narrative:   Chief complaint: Follow-up asthma exacerbation.  Melanie Hess is an 45 y.o. female with a PMH of morbid obesity, malignant hypertension, type 2 diabetes and asthma who was admitted 11/08/15 with an asthma exacerbation.  Assessment/Plan:   Principal Problem:   Asthma exacerbation, undetermined asthma severity/Acute respiratory distress/acute respiratory failure with hypoxia BiPAP initiated on admission, weaned to nasal cannula 11/08/15. Given IV magnesium in the ED. Continue Solu-Medrol, bronchodilators, Breo Ellipta. Decrease Solu-Medrol to daily dosing, transfer to floor.  Mobilize.  Active Problems:   Morbid obesity She is pursuing bariatric surgery and has an appointment this month.    OSA Reviewed Melanie Hess's notes from 10/23/15: Sleep study performed 09/15/15: AHI of 66.7/H indicating Moderate to severe apnea. She was prescribed CPAP with a trial of 19 cm H2O. She has not yet been fitted with a CPAP mask. I will consult with the case manager to see about getting her a CPAP with the recommended settings.    Hypertension Continue Norvasc, Avapro and chlorthalidone. Blood pressure controlled. Lisinopril discontinued 11/08/15 secondary to cough.    Diabetes mellitus type 2, controlled, without complications (HCC) Continue metformin. Currently being managed with moderate scale SSI. CBGs 189-231. Change to insulin resistant scale.   Family Communication/Anticipated D/C date and plan/Code Status   DVT prophylaxis: Lovenox ordered. Code Status: Full Code.  Family Communication: Daughter at the bedside. Disposition Plan: Home when respiratory status stable, possibly tomorrow.   Medical Consultants:    None.   Procedures:    None  Anti-Infectives:    None  Subjective:   The patient reports that her shortness of breath  is much better.  Cough improved. No chest tightness. Review of symptoms is negative for nausea/vomiting.  Objective:    Vitals:   11/08/15 2013 11/08/15 2254 11/09/15 0100 11/09/15 0428  BP:  132/61  (!) 121/49  Pulse: 85 87 77 69  Resp: (!) 21 (!) 24 (!) 24 19  Temp:  98 F (36.7 C)  97.4 F (36.3 C)  TempSrc:  Oral  Oral  SpO2: 96% 97% 99% 98%  Weight:      Height:        Intake/Output Summary (Last 24 hours) at 11/09/15 [id number] Last data filed at 11/09/15 0400  Gross per 24 hour  Intake              120 ml  Output              300 ml  Net             -180 ml   Filed Weights   11/08/15 1800  Weight: (!) 180 kg (396 lb 13.3 oz)    Exam: General exam: Appears calm and comfortable. Respiratory system: Decreased bilaterally, no wheeze.  Cardiovascular system: S1 & S2 heard, RRR. No JVD,  rubs, gallops or clicks. No murmurs. Gastrointestinal system: Abdomen is obese, soft and nontender. No organomegaly or masses felt. Normal bowel sounds heard. Central nervous system: Alert and oriented. No focal neurological deficits. Extremities: No clubbing, or cyanosis. No edema. Skin: No rashes, lesions or ulcers. Psychiatry: Judgement and insight appear normal. Mood & affect appropriate.   Data Reviewed:   I have personally reviewed following labs and imaging studies:  Labs: Basic Metabolic Panel:  Recent Labs Lab 11/08/15 0415 11/09/15 0254  NA 138 140  K 3.7 4.4  CL 105 108  CO2 24 23  GLUCOSE 178* 182*  BUN 18 16  CREATININE 1.14* 1.00  CALCIUM 8.7* 8.8*   GFR Estimated Creatinine Clearance: 118.1 mL/min (by C-G formula based on SCr of 1 mg/dL). Liver Function Tests: No results for input(s): AST, ALT, ALKPHOS, BILITOT, PROT, ALBUMIN in the last 168 hours. No results for input(s): LIPASE, AMYLASE in the last 168 hours. No results for input(s): AMMONIA in the last 168 hours. Coagulation profile No results for input(s): INR, PROTIME in the last 168  hours.  CBC:  Recent Labs Lab 11/08/15 0415 11/09/15 0254  WBC 9.3 11.9*  NEUTROABS 5.2  --   HGB 12.0 11.5*  HCT 37.2 35.4*  MCV 86.9 85.9  PLT 266 283   CBG:  Recent Labs Lab 11/08/15 0758 11/08/15 1221 11/08/15 1623 11/08/15 2152  GLUCAP 231* 215* 189* 201*    Microbiology Recent Results (from the past 240 hour(s))  MRSA PCR Screening     Status: None   Collection Time: 11/08/15  2:03 PM  Result Value Ref Range Status   MRSA by PCR NEGATIVE NEGATIVE Final    Comment:        The GeneXpert MRSA Assay (FDA approved for NASAL specimens only), is one component of a comprehensive MRSA colonization surveillance program. It is not intended to diagnose MRSA infection nor to guide or monitor treatment for MRSA infections.     Radiology: Dg Chest Portable 1 View  Result Date: 11/08/2015 CLINICAL DATA:  Shortness of breath and chest tightness beginning at 9 p.m. History of asthma. EXAM: PORTABLE CHEST 1 VIEW COMPARISON:  Chest radiograph October 18, 2014 FINDINGS: Habitus limited examination. The heart size and mediastinal contours are within normal limits. Both lungs are clear. The visualized skeletal structures are unremarkable. IMPRESSION: No acute cardiopulmonary process on this habitus limited examination. Electronically Signed   By: Awilda Metro M.D.   On: 11/08/2015 04:11    Medications:   . amLODipine  10 mg Oral Daily  . chlorthalidone  25 mg Oral Daily  . enoxaparin (LOVENOX) injection  90 mg Subcutaneous Q24H  . fluticasone furoate-vilanterol  1 puff Inhalation Daily  . Influenza vac split quadrivalent PF  0.5 mL Intramuscular Tomorrow-1000  . insulin aspart  0-15 Units Subcutaneous TID WC  . insulin aspart  0-5 Units Subcutaneous QHS  . ipratropium-albuterol  3 mL Nebulization QID  . irbesartan  300 mg Oral Daily  . metFORMIN  1,000 mg Oral BID WC  . methylPREDNISolone (SOLU-MEDROL) injection  60 mg Intravenous Q12H  . pneumococcal 23 valent  vaccine  0.5 mL Intramuscular Tomorrow-1000   Continuous Infusions:   Medical decision making is of moderate complexity and this patient is at moderate risk of deterioration, therefore this is a level 2 visit.    LOS: 1 day   Hess,Melanie  [facility] Pager [phone]. If unable to reach me by pager, please call my cell phone at [phone].  *Please refer to [email or web address], password [email or web address] to get updated schedule on who will round on this patient, as hospitalists switch teams weekly. If 7PM-7AM, please contact night-coverage at [email or web address], password [email or web address] for any overnight needs.  11/09/2015, 7:22 AM

## 2015-11-10 ENCOUNTER — Telehealth: Payer: Self-pay | Admitting: Internal Medicine

## 2015-11-10 LAB — GLUCOSE, CAPILLARY
GLUCOSE-CAPILLARY: 115 mg/dL — AB (ref 65–99)
GLUCOSE-CAPILLARY: 120 mg/dL — AB (ref 65–99)
Glucose-Capillary: 201 mg/dL — ABNORMAL HIGH (ref 65–99)
Glucose-Capillary: 250 mg/dL — ABNORMAL HIGH (ref 65–99)

## 2015-11-10 MED ORDER — PREDNISONE 10 MG (21) PO TBPK: ORAL_TABLET | ORAL | 0 refills | Status: DC
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
## 2015-11-10 NOTE — Progress Notes (Signed)
   11/10/15 1120  Clinical Encounter Type  Visited With Patient and family together  Visit Type Other (Comment) (Advanced Directive)  Referral From Nurse  Consult/Referral To Chaplain  Spiritual Encounters  Spiritual Needs Other (Comment) (Advanced Directive)  Stress Factors  Patient Stress Factors Health changes  Family Stress Factors Family relationships  Provided documentation to Pt who will look over and discuss with family.

## 2015-11-10 NOTE — Progress Notes (Signed)
Pt. tolerated CPAP well, no c/o., will need CPAP unit along with oxygen concentrator prior to discharge.

## 2015-11-10 NOTE — Telephone Encounter (Signed)
Triage  Dr Darnelle Catalanama [facility] called   - needs asthma fu in 1 week with app  - also needs osa fu and eval with one of our sleep docs - give 1 please   Dr. Kalman ShanMurali Yanira Tolsma, M.D., [facility]C.P Pulmonary and Critical Care Medicine Staff Physician [facility] Pager: [phone], If no answer or between  15:00h - 7:00h: call [phone]  11/10/2015 1:11 PM

## 2015-11-10 NOTE — Telephone Encounter (Signed)
lmtcb for pt.  

## 2015-11-10 NOTE — Progress Notes (Signed)
Pt. placed on CPAP in auto mode per order with L/FFM added 2 lpm oxygen into circuit, auto pressure: <8/>20, was charted per sleep study at 19 cmh20, humidity added, tolerating well, RT to monitor.

## 2015-11-10 NOTE — Discharge Instructions (Signed)
Asthma Attack Prevention While you may not be able to control the fact that you have asthma, you can take actions to prevent asthma attacks. The best way to prevent asthma attacks is to maintain good control of your asthma. You can achieve this by:  Taking your medicines as directed.  Avoiding things that can irritate your airways or make your asthma symptoms worse (asthma triggers).  Keeping track of how well your asthma is controlled and of any changes in your symptoms.  Responding quickly to worsening asthma symptoms (asthma attack).  Seeking emergency care when it is needed. WHAT ARE SOME WAYS TO PREVENT AN ASTHMA ATTACK? Have a Plan Work with your health care provider to create a written plan for managing and treating your asthma attacks (asthma action plan). This plan includes:  A list of your asthma triggers and how you can avoid them.  Information on when medicines should be taken and when their dosages should be changed.  The use of a device that measures how well your lungs are working (peak flow meter). Monitor Your Asthma Use your peak flow meter and record your results in a journal every day. A drop in your peak flow numbers on one or more days may indicate the start of an asthma attack. This can happen even before you start to feel symptoms. You can prevent an asthma attack from getting worse by following the steps in your asthma action plan. Avoid Asthma Triggers Work with your asthma health care provider to find out what your asthma triggers are. This can be done by:  Allergy testing.  Keeping a journal that notes when asthma attacks occur and the factors that may have contributed to them.  Determining if there are other medical conditions that are making your asthma worse. Once you have determined your asthma triggers, take steps to avoid them. This may include avoiding excessive or prolonged exposure to:  Dust. Have someone dust and vacuum your home for you once or  twice a week. Using a high-efficiency particulate arrestance (HEPA) vacuum is best.  Smoke. This includes campfire smoke, forest fire smoke, and secondhand smoke from tobacco products.  Pet dander. Avoid contact with animals that you know you are allergic to.  Allergens from trees, grasses or pollens. Avoid spending a lot of time outdoors when pollen counts are high, and on very windy days.  Very cold, dry, or humid air.  Mold.  Foods that contain high amounts of sulfites.  Strong odors.  Outdoor air pollutants, such as engine exhaust.  Indoor air pollutants, such as aerosol sprays and fumes from household cleaners.  Household pests, including dust mites and cockroaches, and pest droppings.  Certain medicines, including NSAIDs. Always talk to your health care provider before stopping or starting any new medicines. Medicines Take over-the-counter and prescription medicines only as told by your health care provider. Many asthma attacks can be prevented by carefully following your medicine schedule. Taking your medicines correctly is especially important when you cannot avoid certain asthma triggers. Act Quickly If an asthma attack does happen, acting quickly can decrease how severe it is and how long it lasts. Take these steps:   Pay attention to your symptoms. If you are coughing, wheezing, or having difficulty breathing, do not wait to see if your symptoms go away on their own. Follow your asthma action plan.  If you have followed your asthma action plan and your symptoms are not improving, call your health care provider or seek immediate medical care   at the nearest hospital. It is important to note how often you need to use your fast-acting rescue inhaler. If you are using your rescue inhaler more often, it may mean that your asthma is not under control. Adjusting your asthma treatment plan may help you to prevent future asthma attacks and help you to gain better control of your  condition. HOW CAN I PREVENT AN ASTHMA ATTACK WHEN I EXERCISE? Follow advice from your health care provider about whether you should use your fast-acting inhaler before exercising. Many people with asthma experience exercise-induced bronchoconstriction (EIB). This condition often worsens during vigorous exercise in cold, humid, or dry environments. Usually, people with EIB can stay very active by pre-treating with a fast-acting inhaler before exercising.   This information is not intended to replace advice given to you by your health care provider. Make sure you discuss any questions you have with your health care provider.   Document Released: 12/23/2008 Document Revised: 09/25/2014 Document Reviewed: 06/06/2014 Elsevier Interactive Patient Education 2016 [employer]  

## 2015-11-10 NOTE — Discharge Summary (Addendum)
Physician Discharge Summary  Melanie Hess WUJ:[id number] DOB: [birth year] DOA: 11/08/2015  PCP: Melanie ChimeraEESE,BETTI D, MD  Admit date: 11/08/2015 Discharge date: 11/10/2015  Admitted From: Home Discharge disposition: Home   Recommendations for Outpatient Follow-Up:   1. Recommend close outpatient F/U of respiratory symptoms and glycemic control.   Discharge Diagnosis:   Principal Problem:    Asthma exacerbation with undetermined asthma severity  Active Problems:    Hypertension    Morbid obesity (HCC)    Respiratory distress    Diabetes mellitus type 2, controlled, without complications (HCC)    Acute respiratory distress    SOB (shortness of breath)    OSA on CPAP    Acute respiratory failure with hypoxia (HCC)   Discharge Condition: Improved.  Diet recommendation: Low sodium, heart healthy.  Carbohydrate-modified.    History of Present Illness:   Melanie Hess is an 45 y.o. female with a PMH of morbid obesity, malignant hypertension, type 2 diabetes and asthma who was admitted 11/08/15 with an asthma exacerbation.   Hospital Course by Problem:   Principal Problem:   Asthma exacerbation/Acute respiratory distress/acute respiratory failure with hypoxia BiPAP initiated on admission, weaned to nasal cannula 11/08/15. Given IV magnesium in the ED. Initially treated with Solu-Medrol, weaned to prednisone and will send home on a prednisone taper.  Active Problems:   Morbid obesity She is pursuing bariatric surgery and has an appointment this month.    OSA Reviewed Melanie Hess's notes from 10/23/15: Sleep study performed 09/15/15: AHI of 66.7/H indicating Moderate to severe apnea. She was prescribed CPAP with a trial of 19 cm H2O. She has not yet been fitted with a CPAP mask. I will consult with the case manager to see about getting her a CPAP with the recommended settings. Discussed her care with her pulmonologist, Melanie Hess, who will arrange follow  up.    Hypertension Continue Norvasc, Avapro and chlorthalidone. Blood pressure controlled. Lisinopril discontinued 11/08/15 secondary to cough.    Diabetes mellitus type 2, controlled, without complications (HCC) Continue metformin. Needs close follow-up with PCP to achieve better glycemic control.   Medical Consultants:    None.   Discharge Exam:   Vitals:   11/10/15 1254 11/10/15 1412  BP:    Pulse: 86 71  Resp:  18  Temp:     Vitals:   11/10/15 1245 11/10/15 1247 11/10/15 1254 11/10/15 1412  BP: (!) 147/85     Pulse: 65 69 86 71  Resp: 18   18  Temp: 97.9 F (36.6 C)     TempSrc: Oral     SpO2:  97% 97% 97%  Weight:      Height:        General exam: Appears calm and comfortable. Respiratory system: Decreased bilaterally, no wheeze.  Cardiovascular system: S1 & S2 heard, RRR. No JVD,  rubs, gallops or clicks. No murmurs. Gastrointestinal system: Abdomen is obese, soft and nontender. No organomegaly or masses felt. Normal bowel sounds heard. Central nervous system: Alert and oriented. No focal neurological deficits. Extremities: No clubbing, or cyanosis. No edema. Skin: No rashes, lesions or ulcers. Psychiatry: Judgement and insight appear normal. Mood & affect appropriate.    The results of significant diagnostics from this hospitalization (including imaging, microbiology, ancillary and laboratory) are listed below for reference.     Procedures and Diagnostic Studies:   Dg Chest Portable 1 View  Result Date: 11/08/2015 CLINICAL DATA:  Shortness of breath and chest tightness beginning at 9 p.m. History  of asthma. EXAM: PORTABLE CHEST 1 VIEW COMPARISON:  Chest radiograph October 18, 2014 FINDINGS: Habitus limited examination. The heart size and mediastinal contours are within normal limits. Both lungs are clear. The visualized skeletal structures are unremarkable. IMPRESSION: No acute cardiopulmonary process on this habitus limited examination.  Electronically Signed   By: Awilda Metro M.Hess.   On: 11/08/2015 04:11     Labs:   Basic Metabolic Panel:  Recent Labs Lab 11/08/15 0415 11/09/15 0254  NA 138 140  K 3.7 4.4  CL 105 108  CO2 24 23  GLUCOSE 178* 182*  BUN 18 16  CREATININE 1.14* 1.00  CALCIUM 8.7* 8.8*   GFR Estimated Creatinine Clearance: 118.1 mL/min (by C-G formula based on SCr of 1 mg/dL). Liver Function Tests: No results for input(s): AST, ALT, ALKPHOS, BILITOT, PROT, ALBUMIN in the last 168 hours. No results for input(s): LIPASE, AMYLASE in the last 168 hours. No results for input(s): AMMONIA in the last 168 hours. Coagulation profile No results for input(s): INR, PROTIME in the last 168 hours.  CBC:  Recent Labs Lab 11/08/15 0415 11/09/15 0254  WBC 9.3 11.9*  NEUTROABS 5.2  --   HGB 12.0 11.5*  HCT 37.2 35.4*  MCV 86.9 85.9  PLT 266 283   CBG:  Recent Labs Lab 11/09/15 1759 11/09/15 2158 11/10/15 0744 11/10/15 1211 11/10/15 1715  GLUCAP 209* 152* 115* 201* 120*   Microbiology Recent Results (from the past 240 hour(s))  MRSA PCR Screening     Status: None   Collection Time: 11/08/15  2:03 PM  Result Value Ref Range Status   MRSA by PCR NEGATIVE NEGATIVE Final    Comment:        The GeneXpert MRSA Assay (FDA approved for NASAL specimens only), is one component of a comprehensive MRSA colonization surveillance program. It is not intended to diagnose MRSA infection nor to guide or monitor treatment for MRSA infections.      Discharge Instructions:   Discharge Instructions    Call MD for:  difficulty breathing, headache or visual disturbances    Complete by:  As directed    Call MD for:  extreme fatigue    Complete by:  As directed    Call MD for:  temperature >100.4    Complete by:  As directed    Diet - low sodium heart healthy    Complete by:  As directed    Increase activity slowly    Complete by:  As directed        Medication List    STOP taking  these medications   lisinopril 40 MG tablet Commonly known as:  PRINIVIL,ZESTRIL     TAKE these medications   acetaminophen 500 MG tablet Commonly known as:  TYLENOL Take 2 tablets (1,000 mg total) by mouth every 6 (six) hours as needed. What changed:  how much to take  when to take this  reasons to take this   albuterol (2.5 MG/3ML) 0.083% nebulizer solution Commonly known as:  PROVENTIL Take 3 mLs (2.5 mg total) by nebulization 2 (two) times daily as needed for wheezing or shortness of breath.   albuterol 108 (90 Base) MCG/ACT inhaler Commonly known as:  PROVENTIL HFA;VENTOLIN HFA Inhale 2 puffs into the lungs every 6 (six) hours as needed for wheezing or shortness of breath.   amLODipine 10 MG tablet Commonly known as:  NORVASC Take 1 tablet (10 mg total) by mouth daily.   chlorthalidone 25 MG tablet Commonly known  as:  HYGROTON Take 1 tablet (25 mg total) by mouth daily.   fluticasone furoate-vilanterol 100-25 MCG/INH Aepb Commonly known as:  BREO ELLIPTA Inhale 1 puff into the lungs daily.   metFORMIN 500 MG 24 hr tablet Commonly known as:  GLUCOPHAGE-XR Take 1,000 mg by mouth 2 (two) times daily.   olmesartan 40 MG tablet Commonly known as:  BENICAR Take 40 mg by mouth daily.   predniSONE 10 MG (21) Tbpk tablet Commonly known as:  STERAPRED UNI-PAK 21 TAB Take as directed      Follow-up Information    [facility] .   Specialty:  Pulmonology Why:  Office will call with an appt. Contact information: [address] [place] [address] [phone]           Time coordinating discharge: > 35 minutes.  Signed:  Lenox Ladouceur  Pager [phone] [facility] 11/10/2015, 5:25 PM

## 2015-11-10 NOTE — Care Management Note (Addendum)
Case Management Note  Patient Details  Name: Melanie Hess MRN: [id number] Date of Birth: 1970-06-18  Subjective/Objective:                Admitted with asthma exacerbation, medical history significant of morbid obesity, malignant HTN, DM2, asthma. Resides with family. Independent with ADL's PTA. Owns walker, cane, and  Wheelchair.  PCP: Leilani AbleBetti Reese  Action/Plan: Plan is to d/c to home.  Expected Discharge Date:                  Expected Discharge Plan:  Home/Self Care (Lives with son and niece)  In-House Referral:     Discharge planning Services  CM Consult  Post Acute Care Choice:    Choice offered to:  Patient  DME Arranged:  CPAP DME Agency:  [facility] referral made with Jermaine @ [phone]  [facility]    [facility]:     Status of Service:  Completed, signed off  If discussed at [employer] Length of Stay Meetings, dates discussed:    Additional Comments:  Melanie Hess, Melanie Hubbert Hudson, RN 11/10/2015, 4:20 PM

## 2015-11-11 LAB — GLUCOSE, CAPILLARY
GLUCOSE-CAPILLARY: 99 mg/dL (ref 65–99)
Glucose-Capillary: 169 mg/dL — ABNORMAL HIGH (ref 65–99)

## 2015-11-11 NOTE — Progress Notes (Signed)
Nsg Discharge Note  Admit Date:  11/08/2015 Discharge date: 11/11/2015   Elnita Maxwellheryl A Diaz-Ramirez to be D/C'd Home per MD order.  AVS completed.  Copy for chart, and copy for patient signed, and dated. Patient/caregiver able to verbalize understanding.  Discharge Medication:   Medication List    STOP taking these medications   lisinopril 40 MG tablet Commonly known as:  PRINIVIL,ZESTRIL     TAKE these medications   acetaminophen 500 MG tablet Commonly known as:  TYLENOL Take 2 tablets (1,000 mg total) by mouth every 6 (six) hours as needed. What changed:  how much to take  when to take this  reasons to take this   albuterol (2.5 MG/3ML) 0.083% nebulizer solution Commonly known as:  PROVENTIL Take 3 mLs (2.5 mg total) by nebulization 2 (two) times daily as needed for wheezing or shortness of breath.   albuterol 108 (90 Base) MCG/ACT inhaler Commonly known as:  PROVENTIL HFA;VENTOLIN HFA Inhale 2 puffs into the lungs every 6 (six) hours as needed for wheezing or shortness of breath.   amLODipine 10 MG tablet Commonly known as:  NORVASC Take 1 tablet (10 mg total) by mouth daily.   chlorthalidone 25 MG tablet Commonly known as:  HYGROTON Take 1 tablet (25 mg total) by mouth daily.   fluticasone furoate-vilanterol 100-25 MCG/INH Aepb Commonly known as:  BREO ELLIPTA Inhale 1 puff into the lungs daily.   metFORMIN 500 MG 24 hr tablet Commonly known as:  GLUCOPHAGE-XR Take 1,000 mg by mouth 2 (two) times daily.   olmesartan 40 MG tablet Commonly known as:  BENICAR Take 40 mg by mouth daily.   predniSONE 10 MG (21) Tbpk tablet Commonly known as:  STERAPRED UNI-PAK 21 TAB Take as directed       Discharge Assessment: Vitals:   11/11/15 0517 11/11/15 1342  BP: 108/67 138/79  Pulse: (!) 103 80  Resp: 18 18  Temp: 97.5 F (36.4 C)    Skin clean, dry and intact without evidence of skin break down, no evidence of skin tears noted. IV catheter discontinued  intact. Site without signs and symptoms of complications - no redness or edema noted at insertion site, patient denies c/o pain - only slight tenderness at site.  Dressing with slight pressure applied.  D/c Instructions-Education: Discharge instructions given to patient/family with verbalized understanding. D/c education completed with patient/family including follow up instructions, medication list, d/c activities limitations if indicated, with other d/c instructions as indicated by MD - patient able to verbalize understanding, all questions fully answered. Patient instructed to return to ED, call 911, or call MD for any changes in condition.  Patient escorted via WC, and D/C home via private auto.  Camillo FlamingVicki L Mabry Santarelli, RN 11/11/2015 2:43 PM

## 2015-11-12 ENCOUNTER — Emergency Department (HOSPITAL_COMMUNITY)
Admission: EM | Admit: 2015-11-12 | Discharge: 2015-11-12 | Disposition: A | Discharge status: Home / Self Care | Payer: Medicaid Other | Attending: Emergency Medicine | Admitting: Emergency Medicine

## 2015-11-12 ENCOUNTER — Emergency Department (HOSPITAL_COMMUNITY): Payer: Medicaid Other

## 2015-11-12 ENCOUNTER — Encounter (HOSPITAL_COMMUNITY): Payer: Self-pay | Admitting: *Deleted

## 2015-11-12 DIAGNOSIS — E119 Type 2 diabetes mellitus without complications: Secondary | ICD-10-CM | POA: Diagnosis not present

## 2015-11-12 DIAGNOSIS — Z7984 Long term (current) use of oral hypoglycemic drugs: Secondary | ICD-10-CM | POA: Diagnosis not present

## 2015-11-12 DIAGNOSIS — I10 Essential (primary) hypertension: Secondary | ICD-10-CM | POA: Insufficient documentation

## 2015-11-12 DIAGNOSIS — R0602 Shortness of breath: Secondary | ICD-10-CM | POA: Diagnosis present

## 2015-11-12 DIAGNOSIS — R0789 Other chest pain: Secondary | ICD-10-CM | POA: Diagnosis not present

## 2015-11-12 DIAGNOSIS — J45909 Unspecified asthma, uncomplicated: Secondary | ICD-10-CM | POA: Insufficient documentation

## 2015-11-12 LAB — BASIC METABOLIC PANEL
Anion gap: 11 (ref 5–15)
BUN: 22 mg/dL — AB (ref 6–20)
CALCIUM: 8.8 mg/dL — AB (ref 8.9–10.3)
CO2: 24 mmol/L (ref 22–32)
CREATININE: 1.3 mg/dL — AB (ref 0.44–1.00)
Chloride: 104 mmol/L (ref 101–111)
GFR calc non Af Amer: 48 mL/min — ABNORMAL LOW (ref >60)
GFR, EST AFRICAN AMERICAN: 56 mL/min — AB (ref >60)
Glucose, Bld: 134 mg/dL — ABNORMAL HIGH (ref 65–99)
Potassium: 3.4 mmol/L — ABNORMAL LOW (ref 3.5–5.1)
Sodium: 139 mmol/L (ref 135–145)

## 2015-11-12 LAB — CBC
HCT: 38 % (ref 36.0–46.0)
Hemoglobin: 12.7 g/dL (ref 12.0–15.0)
MCH: 28.5 pg (ref 26.0–34.0)
MCHC: 33.4 g/dL (ref 30.0–36.0)
MCV: 85.2 fL (ref 78.0–100.0)
PLATELETS: 290 10*3/uL (ref 150–400)
RBC: 4.46 MIL/uL (ref 3.87–5.11)
RDW: 15.2 % (ref 11.5–15.5)
WBC: 10.6 10*3/uL — ABNORMAL HIGH (ref 4.0–10.5)

## 2015-11-12 LAB — BRAIN NATRIURETIC PEPTIDE: B Natriuretic Peptide: 17.2 pg/mL (ref 0.0–100.0)

## 2015-11-12 LAB — I-STAT TROPONIN, ED: TROPONIN I, POC: 0 ng/mL (ref 0.00–0.08)

## 2015-11-12 MED ORDER — FENTANYL CITRATE (PF) 100 MCG/2ML IJ SOLN: 50.0000 ug | Freq: Once | INTRAMUSCULAR | Status: DC

## 2015-11-12 MED ORDER — ONDANSETRON HCL 4 MG/2ML IJ SOLN
4.0000 mg | Freq: Once | INTRAMUSCULAR | Status: AC
  Administered 2015-11-12: 4 mg via INTRAVENOUS
  Filled 2015-11-12: qty 2

## 2015-11-12 NOTE — ED Triage Notes (Signed)
Per EMS- pt is from home after recent discharge from hospital yesterday for SOB. Pt was at home with family and had SOB worse with exertion with dizziness. Pt also reports central to left chest pain that radiates to her back. Pt lung sounds were clear with EMS. Pt took home nebs at 2pm. Pt was sent home with prednisone prescription that she has not filled. Pt is also suppose to use CPAP but home health has not brought it to her home yet. Pt reports sleeping with multiple pillows to prop herself.

## 2015-11-12 NOTE — ED Notes (Signed)
Resident at bedside.  

## 2015-11-12 NOTE — ED Notes (Signed)
Pt. Vitals and pain score repeated in order to d/c patient.

## 2015-11-12 NOTE — ED Notes (Signed)
Patient states she is having intermittent sharp pain that goes through the center of her chest to her back however states she does not want pain medicine at this time. Patient made aware to update me if she needs anything. Family at the bedside given crackers.

## 2015-11-12 NOTE — ED Notes (Signed)
Lab to add on BNP.  °

## 2015-11-12 NOTE — ED Provider Notes (Signed)
[facility]-EMERGENCY DEPT Provider Note   CSN: [id number] Arrival date & time: 11/12/15  1756     History   Chief Complaint Chief Complaint  Patient presents with  . Chest Pain    HPI Melanie Hess is a 45 y.o. female with a history of asthma, morbid obesity, diabetes, hypertension, with recent admission for asthma exacerbation requiring BiPAP, discharged on 10/23 returns to the emergency department noting progressively worsening central sharp achy chest pain just under her left breast that is worse with movement, deep breathing and palpaition, She notes exertional SOB limited due to the pain in her chest. She notes that her sx are better with rest. She states that her wheezing and asthma sx seem to still be improved as she states that her "lung feel open." She has also had nausea, and vomiting x1 and states that she has a history of similar symptoms intermittently. She has rx'd nausea medications but hasnt taken any because "she doesn't like taking medicines."  She denies any cough, fever, chills, abdominal pain, palpitations, syncope, or radiation of her sx. No hx of CAD. She has been using her home nebulizers to no avail of her sx. She has not taken anything for pain.  She has rx'd pain medication and nausea medication which she has not been taking.   HPI  Past Medical History:  Diagnosis Date  . Ankle fracture, right 2001  . Anxiety   . Arthritis   . Asthma   . Carpal tunnel syndrome    Bilateral  . Diabetes mellitus without complication (HCC)    Type II  . GERD (gastroesophageal reflux disease)   . Hypertension   . Migraine   . OSA (obstructive sleep apnea)   . Plantar fasciitis of right foot   . Shortness of breath dyspnea    with walking short distances    Patient Active Problem List   Diagnosis Date Noted  . Acute respiratory failure with hypoxia (HCC) 11/08/2015  . Medication management 10/23/2015  . OSA on CPAP 10/23/2015  . Pain of right heel 08/01/2015  .  Chronic pain   . SOB (shortness of breath)   . Respiratory distress 03/24/2014  . Anemia 03/24/2014  . Diabetes mellitus type 2, controlled, without complications (HCC) 03/24/2014  . Asthma exacerbation 03/24/2014  . Acute respiratory failure (HCC) 03/24/2014  . Acute respiratory distress 03/24/2014  . Asthma 02/28/2014  . Hypertension 02/28/2014  . Morbid obesity (HCC) 02/28/2014  . DOE (dyspnea on exertion) 02/28/2014  . Cough 02/14/2014  . Dyspnea and respiratory abnormality 02/14/2014    Past Surgical History:  Procedure Laterality Date  . CARPAL TUNNEL RELEASE Left   . HEEL SPUR RESECTION Right 08/01/2015   Procedure: HEEL SPUR EXCISIONS;  Surgeon: Marcene Corning, MD;  Location: [facility] OR;  Service: Orthopedics;  Laterality: Right;  . TUBAL LIGATION      OB History    No data available       Home Medications    Prior to Admission medications   Medication Sig Start Date End Date Taking? Authorizing Provider  acetaminophen (TYLENOL) 500 MG tablet Take 2 tablets (1,000 mg total) by mouth every 6 (six) hours as needed. Patient taking differently: Take 1,500 mg by mouth 2 (two) times daily as needed (for pain or migraines).  05/29/15  Yes Ace Gins Sam, PA-C  albuterol (PROVENTIL HFA;VENTOLIN HFA) 108 (90 Base) MCG/ACT inhaler Inhale 2 puffs into the lungs every 6 (six) hours as needed for wheezing or shortness of breath.  09/09/15  Yes Kalman Shan, MD  albuterol (PROVENTIL) (2.5 MG/3ML) 0.083% nebulizer solution Take 3 mLs (2.5 mg total) by nebulization 2 (two) times daily as needed for wheezing or shortness of breath. Patient taking differently: Take 2.5 mg by nebulization 3 (three) times daily.  09/03/15  Yes Tammy S Parrett, NP  amLODipine (NORVASC) 10 MG tablet Take 1 tablet (10 mg total) by mouth daily. 07/29/15  Yes Chrystie Nose, MD  chlorthalidone (HYGROTON) 25 MG tablet Take 1 tablet (25 mg total) by mouth daily. 10/23/15 01/21/16 Yes Chrystie Nose, MD  fluticasone  furoate-vilanterol (BREO ELLIPTA) 100-25 MCG/INH AEPB Inhale 1 puff into the lungs daily. 09/03/15  Yes Tammy S Parrett, NP  metFORMIN (GLUCOPHAGE-XR) 500 MG 24 hr tablet Take 1,000 mg by mouth 2 (two) times daily. 12/20/14  Yes Historical Provider, MD  methocarbamol (ROBAXIN) 500 MG tablet Take 500 mg by mouth every 8 (eight) hours as needed for muscle spasms.  10/02/15  Yes Historical Provider, MD  olmesartan (BENICAR) 40 MG tablet Take 40 mg by mouth daily.   Yes Historical Provider, MD  predniSONE (STERAPRED UNI-PAK 21 TAB) 10 MG (21) TBPK tablet Take as directed 11/10/15   Maryruth Bun Rama, MD    Family History Family History  Problem Relation Age of Onset  . Hypertension Mother   . Breast cancer Maternal Aunt   . Brain cancer Cousin     Social History Social History  Substance Use Topics  . Smoking status: Never Smoker  . Smokeless tobacco: Never Used     Comment: years ago may have smoked 1 cig or less a week  . Alcohol use No     Allergies   Aspirin; Nsaids; Penicillins; Percocet [oxycodone-acetaminophen]; Vicodin [hydrocodone-acetaminophen]; and Tramadol   Review of Systems Review of Systems  Constitutional: Positive for activity change. Negative for appetite change, chills and fever.  HENT: Negative for congestion, rhinorrhea, sinus pressure, sneezing and sore throat.   Respiratory: Positive for shortness of breath. Negative for cough and wheezing.   Cardiovascular: Positive for chest pain. Negative for palpitations and leg swelling.  Gastrointestinal: Positive for nausea and vomiting. Negative for abdominal pain, blood in stool and diarrhea.  Genitourinary: Negative for dysuria, flank pain, frequency and urgency.  Musculoskeletal: Negative for back pain, myalgias and neck pain.  Skin: Negative for rash.  Neurological: Positive for light-headedness. Negative for syncope, weakness, numbness and headaches.  All other systems reviewed and are negative.    Physical  Exam Updated Vital Signs BP 137/80   Pulse 67   Temp 97.6 F (36.4 C) (Oral)   Resp 16   Ht 5\' 6"  (1.676 m)   Wt (!) 179.6 kg   LMP 11/05/2015   SpO2 100%   BMI 63.92 kg/m   Physical Exam  Constitutional: She is oriented to person, place, and time. She appears well-developed and well-nourished. No distress.  HENT:  Head: Normocephalic and atraumatic.  Nose: Nose normal.  Mouth/Throat: Oropharynx is clear and moist.  Eyes: Conjunctivae and EOM are normal. Pupils are equal, round, and reactive to light.  Neck: Neck supple.  Cardiovascular: Normal rate, regular rhythm, normal heart sounds and intact distal pulses.   Pulmonary/Chest: Effort normal and breath sounds normal. She exhibits tenderness.    Abdominal: Soft. She exhibits no distension. There is no tenderness.  Musculoskeletal: She exhibits no edema or tenderness.  Neurological: She is alert and oriented to person, place, and time. No cranial nerve deficit. Coordination normal.  Skin: Skin is  warm and dry. No rash noted. She is not diaphoretic.  Nursing note and vitals reviewed.    ED Treatments / Results  Labs (all labs ordered are listed, but only abnormal results are displayed) Labs Reviewed  BASIC METABOLIC PANEL - Abnormal; Notable for the following:       Result Value   Potassium 3.4 (*)    Glucose, Bld 134 (*)    BUN 22 (*)    Creatinine, Ser 1.30 (*)    Calcium 8.8 (*)    GFR calc non Af Amer 48 (*)    GFR calc Af Amer 56 (*)    All other components within normal limits  CBC - Abnormal; Notable for the following:    WBC 10.6 (*)    All other components within normal limits  BRAIN NATRIURETIC PEPTIDE  I-STAT TROPOININ, ED    EKG  EKG Interpretation  Date/Time:  Wednesday November 12 2015 17:59:45 EDT Ventricular Rate:  70 PR Interval:    QRS Duration: 95 QT Interval:  414 QTC Calculation: 447 R Axis:   42 Text Interpretation:  Sinus rhythm Borderline short PR interval No significant change  since last tracing Confirmed by KNAPP  MD-J, JON ([id number]) on 11/12/2015 6:04:32 PM Also confirmed by KNAPP  MD-J, JON [phone]), editor [place] CT, Jola Babinski [phone])  on 11/13/2015 8:55:42 AM       Radiology Dg Chest 2 View  Result Date: 11/12/2015 CLINICAL DATA:  Pain under left breast EXAM: CHEST  2 VIEW COMPARISON:  11/08/2015 FINDINGS: Elevation of the left diaphragm. Low lung volumes. No acute infiltrate or effusion. Borderline cardiomegaly. No pneumothorax. IMPRESSION: Low lung volumes with slightly elevated left diaphragm. No acute infiltrate or edema. Borderline cardiomegaly. Electronically Signed   By: Jasmine Pang M.D.   On: 11/12/2015 19:05    Procedures Procedures (including critical care time)  Medications Ordered in ED Medications  ondansetron (ZOFRAN) injection 4 mg (4 mg Intravenous Given 11/12/15 1923)     Initial Impression / Assessment and Plan / ED Course  I have reviewed the triage vital signs and the nursing notes.  Pertinent labs & imaging results that were available during my care of the patient were reviewed by me and considered in my medical decision making (see chart for details).  Clinical Course   45 y.o. female with recent admission for asthma exacerbation presents to the ED for left sided chest pain and tenderness with pleuritic pain. Exam as above with chest pain easily reproducible, lungs CTAB with no wheezing. Given recent admission for asthma exacerbation feel that the patient has likely sustained a muscle strain from the increased WOB she was doing during her recent exacerbation.   EKG shows no acute ischemic changes from previous tracing.   CXR shows no acute findings.   Labs were drawn and returned showing no significant abnormalities. Neg troponin, BNP. Low suspicion for ACS, dissection, or PE given history and exam.  She was recommended to take her currently rx'd pain medications, as needed for pain relief, and steroids, and was recommended to  follow up closely with her PCP for further care and assessment. Return precautions were given for worsening or concerns. This plan was discussed with the patient at the bedside and she stated both understanding and agreement with this plan   Final Clinical Impressions(s) / ED Diagnoses   Final diagnoses:  Chest wall pain    New Prescriptions Discharge Medication List as of 11/12/2015  8:38 PM  Francoise CeoWarren S Davionne Mastrangelo, DO 11/13/15 1737    Linwood DibblesJon Knapp, MD 11/13/15 [phone]

## 2015-11-13 ENCOUNTER — Ambulatory Visit (INDEPENDENT_AMBULATORY_CARE_PROVIDER_SITE_OTHER): Payer: Medicaid Other | Admitting: Pharmacist

## 2015-11-13 VITALS — BP 138/84 | HR 75

## 2015-11-13 DIAGNOSIS — I10 Essential (primary) hypertension: Secondary | ICD-10-CM | POA: Diagnosis not present

## 2015-11-13 NOTE — Progress Notes (Signed)
Patient ID: Melanie Hess                 DOB: 09/05/1970                      MRN: [id number]     HPI: Melanie Hess is a 45 y.o. female patient of Dr. Rennis GoldenHilty with PMH below who presents today for hypertension follow up.  At her last visit with Dr. Rennis GoldenHilty, pt was started on Chlorthalidone 25 mg daily as BP was still uncontrolled. This week, pt was discharged from the hospital on 10/24 for treatment of an asthma exacerbation and presented to the ED on 10/25 for chest pain but was released due to no pertinent findings. After d/c on 10/24, pt was suppose to discontinue lisinopril and continue current regimen as listed below. Today in clinic, pt reports that she thought she was suppose to stop the amlodipine after d/c because she had developed a dry cough and has not been taking this medication.  Pt reports that she is suppose to pick up her CPAP machine and her prednisone today after this visit. She also complains of N/V/D as well as lightheadedness and dizziness that has started within the last week. She reports she is chronically tired and has SOB and still reports not eating often due to no appetite and GI symptoms listed above.  Cardiac Hx: HTN, DM, normal LV function, morbid obesity  Current HTN meds:  Amlodipine 10 mg in the morning Olmesartan 40 mg daily Chlorthalidone 25 mg daily  Previously tried:  Lisinopril-on ARB at same time Losartan  BP goal: <140/90 (per Dr. Rennis GoldenHilty <120/80)  Family History: Her mother has HTN, a heart murmur and maybe asthma. Her sister also suffers from diabetes.   Social History: She has not had a cigarette in about 10 years and even then a pack would last her about 1 year. She denies smokeless tobacco and reports she has not had alcohol in about 2 months because she is so nervous about her diabetes.   Diet: She reports that she does not eat that much. She eats 2 meals a day. When she is working she eats out eating fried chicken and wings.  When she eats at home she eats cereal, chicken, and broccoli. She states that she love cheese and most of her meals include cheese. She does add salt at the table, but has decreased this since she is nervous about her health.   Has given up caffeinated sodas since her last visit.   She was drinking 3-6 liters of soda per day (Coke/pepsi/mt dew) and is now instead drinking watered down ginger ale.    .   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    Home BP readings: Pt does not check her BP at home.   Wt Readings from Last 3 Encounters:  11/12/15 (!) 396 lb (179.6 kg)  11/08/15 (!) 396 lb 13.3 oz (180 kg)  10/23/15 (!) 399 lb 3.2 oz (181.1 kg)   BP Readings from Last 3 Encounters:  11/13/15 138/84  11/12/15 137/80  11/11/15 138/79   Pulse Readings from Last 3 Encounters:  11/13/15 75  11/12/15 67  11/11/15 80    Renal function: Estimated Creatinine Clearance: 90.7 mL/min (by C-G formula based on SCr of 1.3 mg/dL (H)).  Past Medical History:  Diagnosis Date  . Ankle fracture,  right 2001  . Anxiety   . Arthritis   . Asthma   . Carpal tunnel syndrome    Bilateral  . Diabetes mellitus without complication (HCC)    Type II  . GERD (gastroesophageal reflux disease)   . Hypertension   . Migraine   . OSA (obstructive sleep apnea)   . Plantar fasciitis of right foot   . Shortness of breath dyspnea    with walking short distances    Current Outpatient Prescriptions on File Prior to Visit  Medication Sig Dispense Refill  . acetaminophen (TYLENOL) 500 MG tablet Take 2 tablets (1,000 mg total) by mouth every 6 (six) hours as needed. (Patient taking differently: Take 1,500 mg by mouth 2 (two) times daily as needed (for pain or migraines). ) 30 tablet 0  . albuterol (PROVENTIL HFA;VENTOLIN HFA) 108 (90 Base) MCG/ACT inhaler Inhale 2 puffs into the lungs every 6 (six) hours as needed  for wheezing or shortness of breath. 1 Inhaler 6  . albuterol (PROVENTIL) (2.5 MG/3ML) 0.083% nebulizer solution Take 3 mLs (2.5 mg total) by nebulization 2 (two) times daily as needed for wheezing or shortness of breath. (Patient taking differently: Take 2.5 mg by nebulization 3 (three) times daily. ) 75 mL 5  . amLODipine (NORVASC) 10 MG tablet Take 1 tablet (10 mg total) by mouth daily. 180 tablet 3  . chlorthalidone (HYGROTON) 25 MG tablet Take 1 tablet (25 mg total) by mouth daily. 90 tablet 3  . fluticasone furoate-vilanterol (BREO ELLIPTA) 100-25 MCG/INH AEPB Inhale 1 puff into the lungs daily. 1 each 5  . metFORMIN (GLUCOPHAGE-XR) 500 MG 24 hr tablet Take 1,000 mg by mouth 2 (two) times daily.  3  . methocarbamol (ROBAXIN) 500 MG tablet Take 500 mg by mouth every 8 (eight) hours as needed for muscle spasms.   1  . olmesartan (BENICAR) 40 MG tablet Take 40 mg by mouth daily.    . predniSONE (STERAPRED UNI-PAK 21 TAB) 10 MG (21) TBPK tablet Take as directed 21 tablet 0   No current facility-administered medications on file prior to visit.     Allergies  Allergen Reactions  . Aspirin Shortness Of Breath and Swelling  . Nsaids Shortness Of Breath and Swelling  . Penicillins Swelling, Rash and Other (See Comments)    PCN reaction causing immediate rash, facial/tongue/throat swelling, SOB or lightheadedness with hypotension: YES PCN reaction causing severe rash involving mucus membranes or skin necrosis: YES PCN reaction that required hospitalization NO PCN reaction occurring within the last 10 years: NO   . Percocet [Oxycodone-Acetaminophen] Hives and Itching  . Vicodin [Hydrocodone-Acetaminophen] Hives and Itching  . Tramadol Nausea Only    Blood pressure 138/84, pulse 75, last menstrual period 11/05/2015, SpO2 90 %.   Assessment/Plan: Hypertension: Because pt was just discharged from the hospital, will need to continue current reigmen to determine BP control. Her BP is currently  controlled on this regimen during the most recent hospitilization as well as in clinic today. Counseled pt that the lisinopril was the medication that should be discontinued and was most likely causing her dry cough. Will follow up with patient in 4 weeks for BP check.   Thank you, Freddie Apley. Cleatis Polka, PharmD  [facility]  11/13/2015 10:34 AM

## 2015-11-13 NOTE — Patient Instructions (Addendum)
Return for a follow up appointment in 4 weeks  Check your blood pressure at home daily (if able) and keep record of the readings.  Take your BP meds as follows: Amlodipine 10 mg daily Chlorthalidone 25 mg daily Olmesartan (Benicar) 40 mg daily  Bring all of your meds, your BP cuff and your record of home blood pressures to your next appointment.  Exercise as you're able, try to walk approximately 30 minutes per day.  Keep salt intake to a minimum, especially watch canned and prepared boxed foods.  Eat more fresh fruits and vegetables and fewer canned items.  Avoid eating in fast food restaurants.    HOW TO TAKE YOUR BLOOD PRESSURE: . Rest 5 minutes before taking your blood pressure. .  Don't smoke or drink caffeinated beverages for at least 30 minutes before. . Take your blood pressure before (not after) you eat. . Sit comfortably with your back supported and both feet on the floor (don't cross your legs). . Elevate your arm to heart level on a table or a desk. . Use the proper sized cuff. It should fit smoothly and snugly around your bare upper arm. There should be enough room to slip a fingertip under the cuff. The bottom edge of the cuff should be 1 inch above the crease of the elbow. . Ideally, take 3 measurements at one sitting and record the average.

## 2015-11-17 ENCOUNTER — Encounter: Payer: Self-pay | Admitting: Pharmacist

## 2015-11-17 NOTE — Telephone Encounter (Signed)
Called spoke with patient Melanie Hess scheduled with [facility] for 11.1.17 @ 0900 Melanie Hess is scheduled to see Dr Nicki Guadalajarahomas Kelly on 12.15.17 for sleep follow up and just received her CPAP machine ordered by that office  Will sign and route to MR as Lorain ChildesFYI

## 2015-11-17 NOTE — Telephone Encounter (Signed)
Patient returning call, CB is [phone].

## 2015-11-19 DIAGNOSIS — B029 Zoster without complications: Secondary | ICD-10-CM

## 2015-11-19 HISTORY — DX: Zoster without complications: B02.9

## 2015-11-20 ENCOUNTER — Ambulatory Visit (INDEPENDENT_AMBULATORY_CARE_PROVIDER_SITE_OTHER): Payer: Medicaid Other | Admitting: Acute Care

## 2015-11-20 ENCOUNTER — Encounter: Payer: Self-pay | Admitting: Acute Care

## 2015-11-20 VITALS — BP 142/80 | HR 87 | Ht 66.0 in | Wt >= 6400 oz

## 2015-11-20 DIAGNOSIS — G4733 Obstructive sleep apnea (adult) (pediatric): Secondary | ICD-10-CM | POA: Diagnosis not present

## 2015-11-20 DIAGNOSIS — Z9989 Dependence on other enabling machines and devices: Secondary | ICD-10-CM | POA: Diagnosis not present

## 2015-11-20 DIAGNOSIS — J45901 Unspecified asthma with (acute) exacerbation: Secondary | ICD-10-CM

## 2015-11-20 LAB — NITRIC OXIDE: Nitric Oxide: 16

## 2015-11-20 MED ORDER — FLUTICASONE FUROATE-VILANTEROL 200-25 MCG/INH IN AEPB: 1.0000 | INHALATION_SPRAY | Freq: Every day | RESPIRATORY_TRACT | 6 refills | Status: DC

## 2015-11-20 NOTE — Patient Instructions (Addendum)
It is nice to meet you today. We will do a FENO today We will increase your Breo to the 200 mcg dose. Continue to take it daily.  Rinse your mouth after use. Continue your neb treatments twice daily as you have been doing. Continue using your rescue inhaler up to every 6 hours for shortness of breath or wheezing. Continue on CPAP at bedtime. You appear to be benefiting from the treatment Goal is to wear for at least 4-6 hours each night for maximal clinical benefit. Continue to work on weight loss, as the link between excess weight  and sleep apnea is well established.  Do not drive if sleepy. Follow up with Dr. Marchelle Gearingamaswamy  12/09/2015 as is already scheduled. Please contact office for sooner follow up if symptoms do not improve or worsen or seek emergency care

## 2015-11-20 NOTE — Assessment & Plan Note (Signed)
OSA, with new CPAP machine post hospitalization Plan: Continue on CPAP at bedtime. You appear to be benefiting from the treatment Goal is to wear for at least 4-6 hours each night for maximal clinical benefit. Continue to work on weight loss, as the link between excess weight  and sleep apnea is well established.  Do not drive if sleepy. Follow up with Dr. Marchelle Gearingamaswamy  12/09/2015 as is already scheduled. Please contact office for sooner follow up if symptoms do not improve or worsen or seek emergency care

## 2015-11-20 NOTE — Assessment & Plan Note (Signed)
Slow to resolve asthma exacerbation post hospitalization Compliant with Breo 100 Continued pred taper x 2 days Plan: We will do a FENO today ( 16) We will increase your Breo to the 200 mcg dose. Continue to take it daily.  Rinse your mouth after use. Continue your neb treatments twice daily as you have been doing. Continue using your rescue inhaler up to every 6 hours for shortness of breath or wheezing. Follow up with Dr. Marchelle Gearingamaswamy  12/09/2015 as is already scheduled. Please contact office for sooner follow up if symptoms do not improve or worsen or seek emergency care

## 2015-11-20 NOTE — Progress Notes (Signed)
History of Present Illness [name] is a 45 y.o. female with Asthma and OSA.    11/20/2015 Hospital Follow up. Pt. Presents to the office today for follow up of hospital admission for asthma exacerbation. She was treated with BiPap, weaned to nasal cannula, IV magnesium, and IV solumedrol. She was sent home on a prednisone taper and a CPAP machine for her OSA. She has been using her Breo 100 daily, her neb treatments twice daily, and her Proventil rescue inhaler.She is using her rescue inhaler three times daily between her neb treatments. She does still complain of shortness of breath with exertion. She does not know her triggers. . She still has 2 days left of her discharge  prednisone taper.She is morbidly obese. She has an appointment to see a weight specialist.She is planning on having gastric bypass surgery. We did discuss how weight impacts dyspnea and deconditioning.She is no longer taking an ACEI, and has noted her cough is better.She is wearing a lot of perfume today.She states that she does not have a sense of smell, so she is unaware of whether or not scents trigger her asthma. We discussed not using perfume for a week to see if her shortness of breath improves. She is not hypoxic ( 96% saturation) and she is not wheezing on exam today.She states she does not have PND, runny nose, or runny eyes. She denies fever, chest pain,orthopnea or hemoptysis. No recent travel, leg pain or swelling, no history of blood clots or PE, no exogenous estrogen.  Tests: CPAP Started 11/13/2015 She has worn it all 7 nights CPAP Down Load Auto Set 4-19 cm H2O  Average nightly usage: 7 hours 54 minutes AHI: 1.1  FENO 11/20/2015 16  CXR 11/12/2015 IMPRESSION: Low lung volumes with slightly elevated left diaphragm. No acute infiltrate or edema. Borderline cardiomegaly.   Tests: Methylcholine Challenge.02/26/2014  The FVC, FEV1, FEV1/FVC ratio and FEF25-75% are reduced indicating airway  obstruction. Following administration of bronchodilators, there is no significant response.  Conclusions: Methacholine Inhalation Challenge Pulmonary Function Diagnosis: Moderate Obstructive Airways Disease -Peripheral Airway Positive for hyper-reactive airways at 106.56 CDU's  02/28/2014: Echo: EF:55-60% Trivial mitral valve regurgitation PAP normal    Past medical hx Past Medical History:  Diagnosis Date  . Ankle fracture, right 2001  . Anxiety   . Arthritis   . Asthma   . Carpal tunnel syndrome    Bilateral  . Diabetes mellitus without complication (HCC)    Type II  . GERD (gastroesophageal reflux disease)   . Hypertension   . Migraine   . OSA (obstructive sleep apnea)   . Plantar fasciitis of right foot   . Shortness of breath dyspnea    with walking short distances     Past surgical hx, Family hx, Social hx all reviewed.  Current Outpatient Prescriptions on File Prior to Visit  Medication Sig  . acetaminophen (TYLENOL) 500 MG tablet Take 2 tablets (1,000 mg total) by mouth every 6 (six) hours as needed. (Patient taking differently: Take 1,500 mg by mouth 2 (two) times daily as needed (for pain or migraines). )  . albuterol (PROVENTIL HFA;VENTOLIN HFA) 108 (90 Base) MCG/ACT inhaler Inhale 2 puffs into the lungs every 6 (six) hours as needed for wheezing or shortness of breath.  Marland Kitchen. albuterol (PROVENTIL) (2.5 MG/3ML) 0.083% nebulizer solution Take 3 mLs (2.5 mg total) by nebulization 2 (two) times daily as needed for wheezing or shortness of breath. (Patient taking differently: Take 2.5 mg by nebulization  3 (three) times daily. )  . amLODipine (NORVASC) 10 MG tablet Take 1 tablet (10 mg total) by mouth daily.  . chlorthalidone (HYGROTON) 25 MG tablet Take 1 tablet (25 mg total) by mouth daily.  . fluticasone furoate-vilanterol (BREO ELLIPTA) 100-25 MCG/INH AEPB Inhale 1 puff into the lungs daily.  Marland Kitchen. olmesartan (BENICAR) 40 MG tablet Take 40 mg by mouth daily.  .  predniSONE (STERAPRED UNI-PAK 21 TAB) 10 MG (21) TBPK tablet Take as directed  . metFORMIN (GLUCOPHAGE-XR) 500 MG 24 hr tablet Take 1,000 mg by mouth 2 (two) times daily.  . methocarbamol (ROBAXIN) 500 MG tablet Take 500 mg by mouth every 8 (eight) hours as needed for muscle spasms.    No current facility-administered medications on file prior to visit.      Allergies  Allergen Reactions  . Aspirin Shortness Of Breath and Swelling  . Nsaids Shortness Of Breath and Swelling  . Penicillins Swelling, Rash and Other (See Comments)    PCN reaction causing immediate rash, facial/tongue/throat swelling, SOB or lightheadedness with hypotension: YES PCN reaction causing severe rash involving mucus membranes or skin necrosis: YES PCN reaction that required hospitalization NO PCN reaction occurring within the last 10 years: NO   . Percocet [Oxycodone-Acetaminophen] Hives and Itching  . Vicodin [Hydrocodone-Acetaminophen] Hives and Itching  . Tramadol Nausea Only    Review Of Systems:  Constitutional:   No  weight loss, night sweats,  Fevers, chills, fatigue, or  lassitude.  HEENT:   No headaches,  Difficulty swallowing,  Tooth/dental problems, or  Sore throat,                No sneezing, itching, ear ache, nasal congestion, post nasal drip,   CV:  No chest pain,  Orthopnea, PND, swelling in lower extremities, anasarca, dizziness, palpitations, syncope.   GI  No heartburn, indigestion, abdominal pain, nausea, vomiting, diarrhea, change in bowel habits, loss of appetite, bloody stools.   Resp: + shortness of breath with exertion less at rest.  No excess mucus, no productive cough,  No non-productive cough,  No coughing up of blood.  No change in color of mucus.  No wheezing.  No chest wall deformity  Skin: no rash or lesions.  GU: no dysuria, change in color of urine, no urgency or frequency.  No flank pain, no hematuria   MS:  No joint pain or swelling.  No decreased range of motion.  No  back pain.  Psych:  No change in mood or affect. No depression or anxiety.  No memory loss.   Vital Signs BP (!) 142/80 (BP Location: Right Arm, Patient Position: Sitting, Cuff Size: Large)   Pulse 87   Ht 5\' 6"  (1.676 m)   Wt (!) 400 lb 9.6 oz (181.7 kg)   LMP 11/05/2015   SpO2 96%   BMI 64.66 kg/m    Physical Exam:  General- No distress,  A&Ox3, super  morbidly obese. ( BMI 64.66) ENT: No sinus tenderness, TM clear, pale nasal mucosa, no oral exudate,no post nasal drip, no LAN Cardiac: S1, S2, regular rate and rhythm, no murmur Chest: No wheeze/ rales/ dullness; diminished per bases bilaterally,no accessory muscle use, no nasal flaring, no sternal retractions Abd.: Soft Non-tender, obese Ext: No clubbing cyanosis, edema Neuro:  normal strength Skin: No rashes, warm and dry Psych: normal mood and behavior   Assessment/Plan  OSA on CPAP OSA, with new CPAP machine post hospitalization Plan: Continue on CPAP at bedtime. You appear to be  benefiting from the treatment Goal is to wear for at least 4-6 hours each night for maximal clinical benefit. Continue to work on weight loss, as the link between excess weight  and sleep apnea is well established.  Do not drive if sleepy. Follow up with Dr. Marchelle Gearing  12/09/2015 as is already scheduled. Please contact office for sooner follow up if symptoms do not improve or worsen or seek emergency care       Asthma exacerbation Slow to resolve asthma exacerbation post hospitalization Compliant with Breo 100 Continued pred taper x 2 days Plan: We will do a FENO today ( 16) We will increase your Breo to the 200 mcg dose. Continue to take it daily.  Rinse your mouth after use. Continue your neb treatments twice daily as you have been doing. Continue using your rescue inhaler up to every 6 hours for shortness of breath or wheezing. Follow up with Dr. Marchelle Gearing  12/09/2015 as is already scheduled. Please contact office for sooner  follow up if symptoms do not improve or worsen or seek emergency care      Bevelyn Ngo, NP 11/20/2015  9:52 AM

## 2015-11-23 ENCOUNTER — Encounter (HOSPITAL_COMMUNITY): Payer: Self-pay | Admitting: Emergency Medicine

## 2015-11-23 ENCOUNTER — Emergency Department (HOSPITAL_COMMUNITY)
Admission: EM | Admit: 2015-11-23 | Discharge: 2015-11-23 | Disposition: A | Discharge status: Home / Self Care | Payer: Medicaid Other | Attending: Physician Assistant | Admitting: Physician Assistant

## 2015-11-23 DIAGNOSIS — J45909 Unspecified asthma, uncomplicated: Secondary | ICD-10-CM | POA: Diagnosis not present

## 2015-11-23 DIAGNOSIS — I10 Essential (primary) hypertension: Secondary | ICD-10-CM | POA: Insufficient documentation

## 2015-11-23 DIAGNOSIS — Z79899 Other long term (current) drug therapy: Secondary | ICD-10-CM | POA: Diagnosis not present

## 2015-11-23 DIAGNOSIS — Z7984 Long term (current) use of oral hypoglycemic drugs: Secondary | ICD-10-CM | POA: Insufficient documentation

## 2015-11-23 DIAGNOSIS — E119 Type 2 diabetes mellitus without complications: Secondary | ICD-10-CM | POA: Diagnosis not present

## 2015-11-23 DIAGNOSIS — B029 Zoster without complications: Secondary | ICD-10-CM | POA: Diagnosis not present

## 2015-11-23 MED ORDER — VALACYCLOVIR HCL 1 G PO TABS: 1000.0000 mg | ORAL_TABLET | Freq: Three times a day (TID) | ORAL | 0 refills | Status: DC

## 2015-11-23 MED ORDER — VALACYCLOVIR HCL 500 MG PO TABS
1000.0000 mg | ORAL_TABLET | Freq: Once | ORAL | Status: AC
  Administered 2015-11-23: 1000 mg via ORAL
  Filled 2015-11-23: qty 2

## 2015-11-23 MED ORDER — ACETAMINOPHEN-CODEINE #3 300-30 MG PO TABS: 1.0000 | ORAL_TABLET | Freq: Three times a day (TID) | ORAL | 0 refills | Status: DC | PRN

## 2015-11-23 NOTE — ED Triage Notes (Addendum)
Pt reports she got some shots in her back over a week ago and now has a welts on her back. Pt thinks this could be an allergic reaction. Also recently finished steroids for asthma. However rash is only on L side of back and over to abd. Vesicles are intact.

## 2015-11-23 NOTE — ED Provider Notes (Signed)
[facility]-EMERGENCY DEPT Provider Note   CSN: [id number] Arrival date & time: 11/23/15  1018     History   Chief Complaint Chief Complaint  Patient presents with  . Herpes Zoster    HPI Melanie Hess is a 45 y.o. female.  HPI   Pt presents with left side pain with red rash that began 3 days ago Pain severe "beyond 10/10" worsening, described as burning, worse with any movement or light touch.  Pain has been constant since it's onset, gradually worsening, w/o relief from pt's home narcotic pain medication.  She has not other associated sx.  No family members or housemates have similar sx.   She was recently admitted for asthma, also states she received "shots in her back" and she is concerned for allergic reaction.  Her breathing continues to improve.  No increased SOB, wheeze.  No CP, fever.   Past Medical History:  Diagnosis Date  . Ankle fracture, right 2001  . Anxiety   . Arthritis   . Asthma   . Carpal tunnel syndrome    Bilateral  . Diabetes mellitus without complication (HCC)    Type II  . GERD (gastroesophageal reflux disease)   . Hypertension   . Migraine   . OSA (obstructive sleep apnea)   . Plantar fasciitis of right foot   . Shortness of breath dyspnea    with walking short distances    Patient Active Problem List   Diagnosis Date Noted  . Acute respiratory failure with hypoxia (HCC) 11/08/2015  . Medication management 10/23/2015  . OSA on CPAP 10/23/2015  . Pain of right heel 08/01/2015  . Chronic pain   . SOB (shortness of breath)   . Respiratory distress 03/24/2014  . Anemia 03/24/2014  . Diabetes mellitus type 2, controlled, without complications (HCC) 03/24/2014  . Asthma exacerbation 03/24/2014  . Acute respiratory failure (HCC) 03/24/2014  . Acute respiratory distress 03/24/2014  . Asthma 02/28/2014  . Hypertension 02/28/2014  . Morbid obesity (HCC) 02/28/2014  . DOE (dyspnea on exertion) 02/28/2014  . Cough 02/14/2014  . Dyspnea and  respiratory abnormality 02/14/2014    Past Surgical History:  Procedure Laterality Date  . CARPAL TUNNEL RELEASE Left   . HEEL SPUR RESECTION Right 08/01/2015   Procedure: HEEL SPUR EXCISIONS;  Surgeon: Marcene CorningPeter Dalldorf, MD;  [facility];  Service: Orthopedics;  Laterality: Right;  . TUBAL LIGATION      OB History    No data available       Home Medications    Prior to Admission medications   Medication Sig Start Date End Date Taking? Authorizing Provider  acetaminophen (TYLENOL) 500 MG tablet Take 2 tablets (1,000 mg total) by mouth every 6 (six) hours as needed. Patient taking differently: Take 1,500 mg by mouth 2 (two) times daily as needed (for pain or migraines).  05/29/15   Ace GinsSerena Y Sam, PA-C  albuterol (PROVENTIL HFA;VENTOLIN HFA) 108 (90 Base) MCG/ACT inhaler Inhale 2 puffs into the lungs every 6 (six) hours as needed for wheezing or shortness of breath. 09/09/15   Kalman ShanMurali Ramaswamy, MD  albuterol (PROVENTIL) (2.5 MG/3ML) 0.083% nebulizer solution Take 3 mLs (2.5 mg total) by nebulization 2 (two) times daily as needed for wheezing or shortness of breath. Patient taking differently: Take 2.5 mg by nebulization 3 (three) times daily.  09/03/15   Tammy S Parrett, NP  amLODipine (NORVASC) 10 MG tablet Take 1 tablet (10 mg total) by mouth daily. 07/29/15   Chrystie NoseKenneth C Hilty,  MD  chlorthalidone (HYGROTON) 25 MG tablet Take 1 tablet (25 mg total) by mouth daily. 10/23/15 01/21/16  Chrystie NoseKenneth C Hilty, MD  fluticasone furoate-vilanterol (BREO ELLIPTA) 100-25 MCG/INH AEPB Inhale 1 puff into the lungs daily. 09/03/15   Tammy S Parrett, NP  fluticasone furoate-vilanterol (BREO ELLIPTA) 200-25 MCG/INH AEPB Inhale 1 puff into the lungs daily. 11/20/15   Bevelyn NgoSarah F Groce, NP  Liraglutide (VICTOZA [place]) Inject into the skin.    Historical Provider, MD  metFORMIN (GLUCOPHAGE-XR) 500 MG 24 hr tablet Take 1,000 mg by mouth 2 (two) times daily. 12/20/14   Historical Provider, MD  methocarbamol (ROBAXIN) 500 MG  tablet Take 500 mg by mouth every 8 (eight) hours as needed for muscle spasms.  10/02/15   Historical Provider, MD  olmesartan (BENICAR) 40 MG tablet Take 40 mg by mouth daily.    Historical Provider, MD  predniSONE (STERAPRED UNI-PAK 21 TAB) 10 MG (21) TBPK tablet Take as directed 11/10/15   Maryruth Bunhristina P Rama, MD    Family History Family History  Problem Relation Age of Onset  . Hypertension Mother   . Breast cancer Maternal Aunt   . Brain cancer Cousin     Social History Social History  Substance Use Topics  . Smoking status: Never Smoker  . Smokeless tobacco: Never Used     Comment: years ago may have smoked 1 cig or less a week  . Alcohol use No     Allergies   Aspirin; Nsaids; Penicillins; Percocet [oxycodone-acetaminophen]; Vicodin [hydrocodone-acetaminophen]; and Tramadol   Review of Systems Review of Systems  All other systems reviewed and are negative.    Physical Exam Updated Vital Signs BP 150/98   Pulse 78   Temp 98.3 F (36.8 C) (Oral)   Resp 18   LMP 11/05/2015   SpO2 97%   Physical Exam  Constitutional: She is oriented to person, place, and time. She appears well-developed and well-nourished. No distress.  Obese, uncomfortable appearing female, NAD  HENT:  Head: Normocephalic and atraumatic.  Right Ear: External ear normal.  Left Ear: External ear normal.  Nose: Nose normal.  Mouth/Throat: Oropharynx is clear and moist. No oropharyngeal exudate.  Eyes: Conjunctivae and EOM are normal. Pupils are equal, round, and reactive to light. Right eye exhibits no discharge. Left eye exhibits no discharge. No scleral icterus.  Neck: Normal range of motion. Neck supple. No JVD present. No tracheal deviation present.  Cardiovascular: Normal rate and regular rhythm.   Pulmonary/Chest: Effort normal and breath sounds normal. No stridor. No respiratory distress.  Abdominal: Soft. Bowel sounds are normal. She exhibits no distension.  Musculoskeletal: Normal range  of motion. She exhibits no edema.  Lymphadenopathy:    She has no cervical adenopathy.  Neurological: She is alert and oriented to person, place, and time. She exhibits normal muscle tone. Coordination normal.  Skin: Skin is warm and dry. Rash noted. She is not diaphoretic. There is erythema. No pallor.  Erythematous vesicular rash along left low thoracic dermatome, lesions in tact, no surrounding edema or induration.  Rash extremely ttp and tender to light touch  Psychiatric: She has a normal mood and affect. Her behavior is normal. Judgment and thought content normal.  Nursing note and vitals reviewed.    ED Treatments / Results  Labs (all labs ordered are listed, but only abnormal results are displayed) Labs Reviewed - No data to display  EKG  EKG Interpretation None       Radiology No results found.  Procedures  Procedures (including critical care time)  Medications Ordered in ED Medications - No data to display   Initial Impression / Assessment and Plan / ED Course  I have reviewed the triage vital signs and the nursing notes.  Pertinent labs & imaging results that were available during my care of the patient were reviewed by me and considered in my medical decision making (see chart for details).  Clinical Course    Pt with shingles, started valtrex, pt given tylenol #3, per her request, she has multiple medication allergies.  To follow up closely with PCP.  Final Clinical Impressions(s) / ED Diagnoses   Final diagnoses:  Herpes zoster without complication    New Prescriptions Discharge Medication List as of 11/23/2015 11:17 AM    START taking these medications   Details  acetaminophen-codeine (TYLENOL #3) 300-30 MG tablet Take 1-2 tablets by mouth every 8 (eight) hours as needed for moderate pain., Starting Sun 11/23/2015, Print    valACYclovir (VALTREX) 1000 MG tablet Take 1 tablet (1,000 mg total) by mouth 3 (three) times daily., Starting Sun 11/23/2015,  Print         Danelle Berry, PA-C 12/09/15 1907    Courteney Randall An, MD 12/10/15 1525

## 2015-11-27 ENCOUNTER — Telehealth: Payer: Self-pay | Admitting: Internal Medicine

## 2015-11-27 NOTE — Telephone Encounter (Signed)
[facility]. Called [employer] at [phone] to initiate PA, rep states this is a nonpreferred medication and covered alternatives are Advair, Dulera, ans Symbicort.    MR please advise if we need to initiate PA, or if you'd like to switch to a preferred medication.  Thanks!

## 2015-12-01 MED ORDER — BUDESONIDE-FORMOTEROL FUMARATE 160-4.5 MCG/ACT IN AERO: 2.0000 | INHALATION_SPRAY | Freq: Two times a day (BID) | RESPIRATORY_TRACT | 6 refills | Status: DC

## 2015-12-01 NOTE — Telephone Encounter (Signed)
Called and spoke with pt and she is aware of med change.  symbicort has been sent to her pharmacy. Nothing further is needed.

## 2015-12-01 NOTE — Telephone Encounter (Signed)
lmtcb x1 for pt. 

## 2015-12-01 NOTE — Telephone Encounter (Signed)
Pt returning call.Stanley A Dalton ° °

## 2015-12-01 NOTE — Telephone Encounter (Signed)
Pt returned phone call...contact # [phone].Charm RingsErica R Taylor

## 2015-12-01 NOTE — Telephone Encounter (Signed)
Change Annalie A Diaz-Ramirez\ to symbicort to 160/4.5 ., 2puff bid   Dr. Kalman ShanMurali Karrissa Parchment, M.D., [facility]P Pulmonary and Critical Care Medicine Staff Physician [facility]monary [facility] Pager: [phone], If no answer or between  15:00h - 7:00h: call [phone]  12/01/2015 9:32 AM

## 2015-12-09 ENCOUNTER — Encounter: Payer: Self-pay | Admitting: Internal Medicine

## 2015-12-09 ENCOUNTER — Ambulatory Visit (INDEPENDENT_AMBULATORY_CARE_PROVIDER_SITE_OTHER): Payer: Medicaid Other | Admitting: Internal Medicine

## 2015-12-09 VITALS — BP 132/88 | HR 96 | Ht 66.0 in | Wt 391.0 lb

## 2015-12-09 DIAGNOSIS — R0600 Dyspnea, unspecified: Secondary | ICD-10-CM

## 2015-12-09 DIAGNOSIS — R0689 Other abnormalities of breathing: Secondary | ICD-10-CM

## 2015-12-09 DIAGNOSIS — J4540 Moderate persistent asthma, uncomplicated: Secondary | ICD-10-CM

## 2015-12-09 MED ORDER — FLUTICASONE FUROATE-VILANTEROL 200-25 MCG/INH IN AEPB: 1.0000 | INHALATION_SPRAY | Freq: Every day | RESPIRATORY_TRACT | 6 refills | Status: DC

## 2015-12-09 NOTE — Progress Notes (Signed)
Subjective:     Patient ID: Melanie Hess, female   DOB: 1970-12-02, 45 y.o.   MRN: [id number]  HPI Melanie Hess is a 45 y.o. female with Asthma and OSA.    11/20/2015 Hospital Follow up. Pt. Presents to the office today for follow up of hospital admission for asthma exacerbation. She was treated with BiPap, weaned to nasal cannula, IV magnesium, and IV solumedrol. She was sent home on a prednisone taper and a CPAP machine for her OSA. She has been using her Breo 100 daily, her neb treatments twice daily, and her Proventil rescue inhaler.She is using her rescue inhaler three times daily between her neb treatments. She does still complain of shortness of breath with exertion. She does not know her triggers. . She still has 2 days left of her discharge  prednisone taper.She is morbidly obese. She has an appointment to see a weight specialist.She is planning on having gastric bypass surgery. We did discuss how weight impacts dyspnea and deconditioning.She is no longer taking an ACEI, and has noted her cough is better.She is wearing a lot of perfume today.She states that she does not have a sense of smell, so she is unaware of whether or not scents trigger her asthma. We discussed not using perfume for a week to see if her shortness of breath improves. She is not hypoxic ( 96% saturation) and she is not wheezing on exam today.She states she does not have PND, runny nose, or runny eyes. She denies fever, chest pain,orthopnea or hemoptysis. No recent travel, leg pain or swelling, no history of blood clots or PE, no exogenous estrogen.  Tests: CPAP Started 11/13/2015 She has worn it all 7 nights CPAP Down Load Auto Set 4-19 cm H2O  Average nightly usage: 7 hours 54 minutes AHI: 1.1  FENO 11/20/2015 16  CXR 11/12/2015 IMPRESSION: Low lung volumes with slightly elevated left diaphragm. No acute infiltrate or edema. Borderline cardiomegaly.   Tests: Methylcholine  Challenge.02/26/2014  The FVC, FEV1, FEV1/FVC ratio and FEF25-75% are reduced indicating airway obstruction. Following administration of bronchodilators, there is no significant response.  Conclusions: Methacholine Inhalation Challenge Pulmonary Function Diagnosis: Moderate Obstructive Airways Disease -Peripheral Airway Positive for hyper-reactive airways at 106.56 CDU's  02/28/2014: Echo: EF:55-60% Trivial mitral valve regurgitation PAP normal    OV 12/09/2015  Chief Complaint  Patient presents with  . Follow-up    Pt saw SG for an acute visit on 11.2.17. Pt states her SOB is not back to baseline. Pt c/o dry cough, fatigue, orthopnea. Pt denies f/c/s.     Morbidly obese female with asthma component methacholine challenge test. I personally saw her one in January 2016. 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 October 2017 she had asthma exacerbation and admission. Since then doing well. In the interim early November 2017 saw nurse practitioner in our office. Brio was increased but due to insurance reasons did not go through she says she is eligible for this. Currently she feels asthma is controlled because of Brio. She feels a higher dose of Brio would help her. She also feels the CPAP is helping her. However she has dyspnea on exertion that is significant and she feels this is due to obesity. She does not want workup for complicated asthma such as IgE of blood allergy panel . She wants of Brio represcribed at the high dose     has a past medical history of Ankle fracture, right (2001); Anxiety; Arthritis; Asthma; Carpal tunnel syndrome; Diabetes mellitus without  complication (HCC); GERD (gastroesophageal reflux disease); Hypertension; Migraine; OSA (obstructive sleep apnea); Plantar fasciitis of right foot; and Shortness of breath dyspnea.   reports that she has never smoked. She has never used smokeless tobacco.  Past Surgical History:  Procedure Laterality Date  . CARPAL TUNNEL  RELEASE Left   . HEEL SPUR RESECTION Right 08/01/2015   Procedure: HEEL SPUR EXCISIONS;  Surgeon: Marcene Corning, MD;  Location: [facility] OR;  Service: Orthopedics;  Laterality: Right;  . TUBAL LIGATION      Allergies  Allergen Reactions  . Aspirin Shortness Of Breath and Swelling  . Nsaids Shortness Of Breath and Swelling  . Penicillins Swelling, Rash and Other (See Comments)    PCN reaction causing immediate rash, facial/tongue/throat swelling, SOB or lightheadedness with hypotension: YES PCN reaction causing severe rash involving mucus membranes or skin necrosis: YES PCN reaction that required hospitalization NO PCN reaction occurring within the last 10 years: NO   . Percocet [Oxycodone-Acetaminophen] Hives and Itching  . Vicodin [Hydrocodone-Acetaminophen] Hives and Itching  . Tramadol Nausea Only    Immunization History  Administered Date(s) Administered  . Influenza,inj,Quad PF,36+ Mos 03/26/2014, 11/09/2015  . Pneumococcal Polysaccharide-23 03/26/2014, 11/09/2015    Family History  Problem Relation Age of Onset  . Hypertension Mother   . Breast cancer Maternal Aunt   . Brain cancer Cousin      Current Outpatient Prescriptions:  .  albuterol (PROVENTIL HFA;VENTOLIN HFA) 108 (90 Base) MCG/ACT inhaler, Inhale 2 puffs into the lungs every 6 (six) hours as needed for wheezing or shortness of breath., Disp: 1 Inhaler, Rfl: 6 .  albuterol (PROVENTIL) (2.5 MG/3ML) 0.083% nebulizer solution, Take 3 mLs (2.5 mg total) by nebulization 2 (two) times daily as needed for wheezing or shortness of breath. (Patient taking differently: Take 2.5 mg by nebulization 3 (three) times daily. ), Disp: 75 mL, Rfl: 5 .  amLODipine (NORVASC) 10 MG tablet, Take 1 tablet (10 mg total) by mouth daily., Disp: 180 tablet, Rfl: 3 .  fluticasone furoate-vilanterol (BREO ELLIPTA) 100-25 MCG/INH AEPB, Inhale 1 puff into the lungs daily., Disp: 1 each, Rfl: 5 .  Liraglutide (VICTOZA [place]), Inject into the skin.,  Disp: , Rfl:  .  methocarbamol (ROBAXIN) 500 MG tablet, Take 500 mg by mouth every 8 (eight) hours as needed for muscle spasms. , Disp: , Rfl: 1 .  olmesartan (BENICAR) 40 MG tablet, Take 40 mg by mouth daily., Disp: , Rfl:  .  acetaminophen (TYLENOL) 500 MG tablet, Take 2 tablets (1,000 mg total) by mouth every 6 (six) hours as needed. (Patient not taking: Reported on 12/09/2015), Disp: 30 tablet, Rfl: 0 .  chlorthalidone (HYGROTON) 25 MG tablet, Take 1 tablet (25 mg total) by mouth daily., Disp: 90 tablet, Rfl: 3 .  fluticasone furoate-vilanterol (BREO ELLIPTA) 200-25 MCG/INH AEPB, Inhale 1 puff into the lungs daily. (Patient not taking: Reported on 12/09/2015), Disp: 28 each, Rfl: 6     Review of Systems     Objective:   Physical Exam  Constitutional: She is oriented to person, place, and time. She appears well-developed and well-nourished. No distress.  HENT:  Head: Normocephalic and atraumatic.  Right Ear: External ear normal.  Left Ear: External ear normal.  Mouth/Throat: Oropharynx is clear and moist. No oropharyngeal exudate.  Eyes: Conjunctivae and EOM are normal. Pupils are equal, round, and reactive to light. Right eye exhibits no discharge. Left eye exhibits no discharge. No scleral icterus.  Neck: Normal range of motion. Neck supple. No JVD  present. No tracheal deviation present. No thyromegaly present.  Cardiovascular: Normal rate, regular rhythm, normal heart sounds and intact distal pulses.  Exam reveals no gallop and no friction rub.   No murmur heard. Pulmonary/Chest: Effort normal and breath sounds normal. No respiratory distress. She has no wheezes. She has no rales. She exhibits no tenderness.  Abdominal: Soft. Bowel sounds are normal. She exhibits no distension and no mass. There is no tenderness. There is no rebound and no guarding.  Musculoskeletal: Normal range of motion. She exhibits no edema or tenderness.  Lymphadenopathy:    She has no cervical adenopathy.   Neurological: She is alert and oriented to person, place, and time. She has normal reflexes. No cranial nerve deficit. She exhibits normal muscle tone. Coordination normal.  Skin: Skin is warm and dry. No rash noted. She is not diaphoretic. No erythema. No pallor.  Psychiatric: She has a normal mood and affect. Her behavior is normal. Judgment and thought content normal.  Vitals reviewed.   Vitals:   12/09/15 1006  BP: 132/88  Pulse: 96  SpO2: 97%  Weight: (!) 391 lb (177.4 kg)  Height: 5\' 6"  (1.676 m)   Estimated body mass index is 63.11 kg/m as calculated from the following:   Height as of this encounter: 5\' 6"  (1.676 m).   Weight as of this encounter: 391 lb (177.4 kg).      Assessment:       ICD-9-CM ICD-10-CM   1. Dyspnea and respiratory abnormality 786.09 R06.00     R06.89   2. Moderate persistent asthma, unspecified whether complicated 493.90 J45.40        Plan:      Asthma is stable  Unclear why high dose breo was not sent through - will send out again Refer pulmonary rehab Try to lose weight If above do not work, can test for IgE, blood allergy panel and pulmonary stress test   Dr. Kalman ShanMurali Ashawnti Tangen, M.D., [facility] Pulmonary and Critical Care Medicine Staff Physician [facility] [place] [facility] Pager: [phone], If no answer or between  15:00h - 7:00h: call [phone]  12/09/2015 10:35 AM

## 2015-12-09 NOTE — Addendum Note (Signed)
Addended by: Maxwell MarionBLANKENSHIP, Lamarius Dirr A on: 12/09/2015 11:12 AM   Modules accepted: Orders

## 2015-12-09 NOTE — Patient Instructions (Addendum)
ICD-9-CM ICD-10-CM   1. Dyspnea and respiratory abnormality 786.09 R06.00     R06.89   2. Moderate persistent asthma, unspecified whether complicated 493.90 J45.40     Asthma is stable  Unclear why high dose breo was not sent through - will send out again Refer pulmonary rehab Try to lose weight If above do not work, can test for IgE, blood allergy panel and pulmonary stress test  followup 6 months

## 2015-12-12 ENCOUNTER — Telehealth: Payer: Self-pay | Admitting: Internal Medicine

## 2015-12-12 NOTE — Telephone Encounter (Signed)
I thought notes on 12/09/15 suggestesd she take high dose breo daily  Dr. Kalman ShanMurali Seren Chaloux, M.D., [place] VillageF.C.C.P Pulmonary and Critical Care Medicine Staff Physician [facility] [place] [facility] Pager: [phone], If no answer or between  15:00h - 7:00h: call [phone]  12/12/2015 3:24 PM

## 2015-12-12 NOTE — Telephone Encounter (Signed)
Received a PA for the symbicort 160.  This is not covered by the pts insurance.  We will either need to change this medication or initiate the PA for symbicort. MR please advise. thanks

## 2015-12-15 ENCOUNTER — Ambulatory Visit (INDEPENDENT_AMBULATORY_CARE_PROVIDER_SITE_OTHER): Payer: Medicaid Other | Admitting: Pharmacist Clinician (PhC)/ Clinical Pharmacy Specialist

## 2015-12-15 VITALS — BP 138/86 | HR 84

## 2015-12-15 DIAGNOSIS — I10 Essential (primary) hypertension: Secondary | ICD-10-CM | POA: Diagnosis not present

## 2015-12-15 MED ORDER — VALSARTAN 160 MG PO TABS: 160.0000 mg | ORAL_TABLET | Freq: Every day | ORAL | 5 refills | Status: DC

## 2015-12-15 NOTE — Patient Instructions (Signed)
Return for a a follow up appointment in 6-8 weeks  Go to lab one day next week for blood work to check kidney function  Your blood pressure today is 138/86 (goal is < 130/80)  Take your BP meds as follows:   AM: valsartan 160 mg   PM: amlodipine 10 mg, chlorthalidone 25 mg  Exercise as you're able, try to walk approximately 30 minutes per day.  Keep salt intake to a minimum, especially watch canned and prepared boxed foods.  Eat more fresh fruits and vegetables and fewer canned items.  Avoid eating in fast food restaurants.    HOW TO TAKE YOUR BLOOD PRESSURE: . Rest 5 minutes before taking your blood pressure. .  Don't smoke or drink caffeinated beverages for at least 30 minutes before. . Take your blood pressure before (not after) you eat. . Sit comfortably with your back supported and both feet on the floor (don't cross your legs). . Elevate your arm to heart level on a table or a desk. . Use the proper sized cuff. It should fit smoothly and snugly around your bare upper arm. There should be enough room to slip a fingertip under the cuff. The bottom edge of the cuff should be 1 inch above the crease of the elbow. . Ideally, take 3 measurements at one sitting and record the average.

## 2015-12-15 NOTE — Telephone Encounter (Signed)
Do you want pt to take high dose Breo?

## 2015-12-16 ENCOUNTER — Encounter: Payer: Self-pay | Admitting: Pharmacist Clinician (PhC)/ Clinical Pharmacy Specialist

## 2015-12-16 NOTE — Telephone Encounter (Signed)
Yes high dose breo

## 2015-12-16 NOTE — Assessment & Plan Note (Addendum)
Today her blood pressure remains slightly elevated at 138/86.  Because she has no recollection of taking olmesartan, I am going to put her on valsartan 160 mg once daily.  She is diabetic and cannot tolerate ACEI.  She was told to take this once daily in the mornings, not at the same time as her amlodipine and chlorthalidone.  We will see her again in about 6 weeks for follow up. She was encouraged to improve her diet, give up the sugar cereals for more Special K, Raisin Bran or Cheerios.

## 2015-12-16 NOTE — Progress Notes (Signed)
Patient ID: Melanie Hess                 DOB: Mar 15, 1970                      MRN: [id number]     HPI: Melanie Hess is a 45 y.o. female patient of Dr. Rennis GoldenHilty with PMH below who presents today for hypertension follow up.  We have been working with her for several months now.  At many of her previous visits she was noted to be on olmesartan 40 mg daily, having been switched to this after developing a cough with lisinopril.  Today she brings all of her medications with her, and there is no olmesartan.  She does not recognized the name and is unaware of ever having taken it.    Since her last visit with Prudence DavidsonKelley Auten in October, she visited the ER twice, once with chest wall pain, the other time with shingles. Because of asthma exacerbations she has not been to work for several weeks, but is scheduled to return on Friday for just a few hours.  She states compliance with her medications.  She currently takes amlodipine 10 mg and chlorthalidone 25 mg, both once daily around 5 pm.    Cardiac Hx: HTN, DM, normal LV function, morbid obesity  Current HTN meds:  Amlodipine 10 mg in the morning Chlorthalidone 25 mg daily  Previously tried:  Lisinopril - cough, unsure if related to asthma or med, so was discontinued  BP goal: <130/80 (per Dr. Rennis GoldenHilty <120/80)  Family History: Her mother has HTN, a heart murmur and maybe asthma. Her sister also suffers from diabetes.   Social History: She has not had a cigarette in about 10 years and even then a pack would last her about 1 year. She denies smokeless tobacco and reports she has not had alcohol in about 2 months because she is so nervous about her diabetes.   Diet: Describes her appetite as poor. She states she is not hungry much and is not eating frequently.  She states she has cut back on fried foods and cheese, and is still trying to coordinate having gastric bypass.  States that for breakfast she will eat cereal, usually Special K or  [employer] cereal.  She has cut back on adding salt to her foods   She was previously drinking 3-6 liters of soda per day (Coke/pepsi/mt dew) and is now instead drinking 1-2 cans of ginger ale and water.    Exercise: none recently due to asthma flares.    Home BP readings: Pt does not check her BP at home.   Wt Readings from Last 3 Encounters:  12/09/15 (!) 391 lb (177.4 kg)  11/20/15 (!) 400 lb 9.6 oz (181.7 kg)  11/12/15 (!) 396 lb (179.6 kg)   BP Readings from Last 3 Encounters:  12/16/15 138/86  12/09/15 132/88  11/23/15 150/98   Pulse Readings from Last 3 Encounters:  12/16/15 84  12/09/15 96  11/23/15 74    Renal function: CrCl cannot be calculated (Patient's most recent lab result is older than the maximum 21 days allowed.).  Past Medical History:  Diagnosis Date  . Ankle fracture, right 2001  . Anxiety   . Arthritis   . Asthma   . Carpal tunnel syndrome    Bilateral  . Diabetes mellitus without complication (HCC)    Type II  . GERD (gastroesophageal reflux disease)   . Hypertension   .  Migraine   . OSA (obstructive sleep apnea)   . Plantar fasciitis of right foot   . Shortness of breath dyspnea    with walking short distances    Current Outpatient Prescriptions on File Prior to Visit  Medication Sig Dispense Refill  . acetaminophen (TYLENOL) 500 MG tablet Take 2 tablets (1,000 mg total) by mouth every 6 (six) hours as needed. (Patient not taking: Reported on 12/09/2015) 30 tablet 0  . albuterol (PROVENTIL HFA;VENTOLIN HFA) 108 (90 Base) MCG/ACT inhaler Inhale 2 puffs into the lungs every 6 (six) hours as needed for wheezing or shortness of breath. 1 Inhaler 6  . albuterol (PROVENTIL) (2.5 MG/3ML) 0.083% nebulizer solution Take 3 mLs (2.5 mg total) by nebulization 2 (two) times daily as needed for wheezing or shortness of breath. (Patient taking differently: Take 2.5 mg by nebulization 3 (three) times daily. ) 75 mL 5  . amLODipine (NORVASC) 10 MG  tablet Take 1 tablet (10 mg total) by mouth daily. 180 tablet 3  . chlorthalidone (HYGROTON) 25 MG tablet Take 1 tablet (25 mg total) by mouth daily. 90 tablet 3  . fluticasone furoate-vilanterol (BREO ELLIPTA) 100-25 MCG/INH AEPB Inhale 1 puff into the lungs daily. 1 each 5  . fluticasone furoate-vilanterol (BREO ELLIPTA) 200-25 MCG/INH AEPB Inhale 1 puff into the lungs daily. 28 each 6  . Liraglutide (VICTOZA [place]) Inject into the skin.    . methocarbamol (ROBAXIN) 500 MG tablet Take 500 mg by mouth every 8 (eight) hours as needed for muscle spasms.   1   No current facility-administered medications on file prior to visit.     Allergies  Allergen Reactions  . Aspirin Shortness Of Breath and Swelling  . Nsaids Shortness Of Breath and Swelling  . Penicillins Swelling, Rash and Other (See Comments)    PCN reaction causing immediate rash, facial/tongue/throat swelling, SOB or lightheadedness with hypotension: YES PCN reaction causing severe rash involving mucus membranes or skin necrosis: YES PCN reaction that required hospitalization NO PCN reaction occurring within the last 10 years: NO   . Percocet [Oxycodone-Acetaminophen] Hives and Itching  . Vicodin [Hydrocodone-Acetaminophen] Hives and Itching  . Tramadol Nausea Only    Blood pressure 138/86, pulse 84.   Assessment/Plan:  Today her blood pressure remains slightly elevated at 138/86.  Because she has no recollection of taking olmesartan, I am going to put her on valsartan 160 mg once daily.  She is diabetic and cannot tolerate ACEI.  She was told to take this once daily in the mornings, not at the same time as her amlodipine and chlorthalidone.  We will see her again in about 6 weeks for follow up. She was encouraged to improve her diet, give up the sugar cereals for more Special K, Raisin Bran or Cheerios.      Thank you, Melanie HayKristin Brandan Glauber, PharmD CPP  [facility]  12/16/2015 7:08 AM

## 2015-12-16 NOTE — Telephone Encounter (Signed)
lmtcb for pt. Pt should already be taking Breo 200.

## 2015-12-22 ENCOUNTER — Other Ambulatory Visit: Payer: Self-pay | Admitting: Orthopaedic Surgery

## 2016-01-01 ENCOUNTER — Inpatient Hospital Stay (HOSPITAL_COMMUNITY): Admission: RE | Admit: 2016-01-01 | Payer: Medicaid Other | Source: Ambulatory Visit

## 2016-01-02 ENCOUNTER — Encounter: Payer: Self-pay | Admitting: Cardiovascular Disease

## 2016-01-02 ENCOUNTER — Ambulatory Visit (INDEPENDENT_AMBULATORY_CARE_PROVIDER_SITE_OTHER): Payer: Medicaid Other | Admitting: Cardiovascular Disease

## 2016-01-02 VITALS — BP 152/83 | HR 74 | Ht 66.0 in | Wt 399.2 lb

## 2016-01-02 DIAGNOSIS — E119 Type 2 diabetes mellitus without complications: Secondary | ICD-10-CM

## 2016-01-02 DIAGNOSIS — I10 Essential (primary) hypertension: Secondary | ICD-10-CM

## 2016-01-02 DIAGNOSIS — G4733 Obstructive sleep apnea (adult) (pediatric): Secondary | ICD-10-CM | POA: Diagnosis not present

## 2016-01-02 DIAGNOSIS — Z794 Long term (current) use of insulin: Secondary | ICD-10-CM

## 2016-01-02 MED ORDER — VALSARTAN 320 MG PO TABS: 320.0000 mg | ORAL_TABLET | Freq: Every day | ORAL | 6 refills | Status: DC

## 2016-01-02 NOTE — Patient Instructions (Addendum)
Your physician has recommended you make the following change in your medication:   1.) the valsartan has been increased to 320 mg. Daily.  Your physician recommends that you schedule a follow-up appointment in: 6 weeks with Dr Tresa EndoKelly.

## 2016-01-04 NOTE — Progress Notes (Signed)
Cardiology Office Note    Date:  01/04/2016   ID:  Melanie Hess, DOB [birth year], MRN [id number]  PCP:  Kristine Garbe, MD  Cardiologist:  Shelva Majestic, MD (sleep); Dr. Debara Pickett  Initial sleep clinic evaluation  History of Present Illness:  Melanie Hess is a 45 y.o. female who presents for sleep clinic evaluation folllowing initiation of CPAP therapy.  Ms. Melanie Hess is a 45 year old female who has a history of super morbid obesity, a history of asthma, Type 2 DM,  and poorly  controlled hypertension. He had a history of significant snoring, frequent awakenings, daytime sleepiness, and nonrestorative sleep. She was referred for a sleep study which was done on 09/15/2015.  This was done in a split-night protocol and revealed moderate sleep apnea overall with an AHI of 15.8 per hour; however, there was very severe sleep apnea during REM sleep with an AHI of 66/h. She had severe oxygen desaturation to a nadir of 66%.  CPAP titration performed and a 19 cm pressure was recommended for CPAP initiation.  Typically, she goes to bed at 8 pm and  wakes up at 5 AM.  She the walks her dog, eats breakfast, and then due to fatigue and residual sleepiness goes to back to bed for 2 more hours. She was s in October, and a download from October 26 through 12/28/2015 reveals that she is not meeting compliance with only 54% of usage days and only 35% of days with use greater than 4 hrs. Her AHI is excellent when she is using her CPAP at  1.4/hr.  She falls to sleep without putting her CPAP and then when she wake up later shen then  puts it on.  Her sleep remains poor.  If she goes to the bathroom.  She often does not put upon returning from the bathroom.   A download over the past month, has showed reduced use at  only 3 hours and 6 minutes average per night with an AHI of 1.0.  She continues to be sleepy.  An Epworth Sleepiness kidneys to reveal hypersomnolence as shown below.   Epworth Sleepiness  Scale: Situation   Chance of Dozing/Sleeping (0 = never , 1 = slight chance , 2 = moderate chance , 3 = high chance )   sitting and reading 2   watching TV 3   sitting inactive in a public place 2   being a passenger in a motor vehicle for an hour or more 3   lying down in the afternoon 3   sitting and talking to someone 2   sitting quietly after lunch (no alcohol) 3   while stopped for a few minutes in traffic as the driver 1   Total Score  19     Past Medical History:  Diagnosis Date  . Ankle fracture, right 2001  . Anxiety   . Arthritis   . Asthma   . Carpal tunnel syndrome    Bilateral  . Diabetes mellitus without complication (HCC)    Type II  . GERD (gastroesophageal reflux disease)   . Hypertension   . Migraine   . OSA (obstructive sleep apnea)   . Plantar fasciitis of right foot   . Shortness of breath dyspnea    with walking short distances    Past Surgical History:  Procedure Laterality Date  . CARPAL TUNNEL RELEASE Left   . HEEL SPUR RESECTION Right 08/01/2015   Procedure: HEEL SPUR EXCISIONS;  Surgeon: Melrose Nakayama, MD;  Location: [facility];  Service: Orthopedics;  Laterality: Right;  . TUBAL LIGATION      Current Medications: Outpatient Medications Prior to Visit  Medication Sig Dispense Refill  . acetaminophen (TYLENOL) 500 MG tablet Take 2 tablets (1,000 mg total) by mouth every 6 (six) hours as needed. 30 tablet 0  . albuterol (PROVENTIL HFA;VENTOLIN HFA) 108 (90 Base) MCG/ACT inhaler Inhale 2 puffs into the lungs every 6 (six) hours as needed for wheezing or shortness of breath. 1 Inhaler 6  . albuterol (PROVENTIL) (2.5 MG/3ML) 0.083% nebulizer solution Take 3 mLs (2.5 mg total) by nebulization 2 (two) times daily as needed for wheezing or shortness of breath. (Patient taking differently: Take 2.5 mg by nebulization 3 (three) times daily. ) 75 mL 5  . amLODipine (NORVASC) 10 MG tablet Take 1 tablet (10 mg total) by mouth daily. 180 tablet 3  .  chlorthalidone (HYGROTON) 25 MG tablet Take 1 tablet (25 mg total) by mouth daily. 90 tablet 3  . fluticasone furoate-vilanterol (BREO ELLIPTA) 100-25 MCG/INH AEPB Inhale 1 puff into the lungs daily. 1 each 5  . fluticasone furoate-vilanterol (BREO ELLIPTA) 200-25 MCG/INH AEPB Inhale 1 puff into the lungs daily. 28 each 6  . Liraglutide (VICTOZA [place]) Inject into the skin.    . methocarbamol (ROBAXIN) 500 MG tablet Take 500 mg by mouth every 8 (eight) hours as needed for muscle spasms.   1  . valsartan (DIOVAN) 160 MG tablet Take 1 tablet (160 mg total) by mouth daily. 30 tablet 5   No facility-administered medications prior to visit.      Allergies:   Aspirin; Nsaids; Penicillins; Percocet [oxycodone-acetaminophen]; Vicodin [hydrocodone-acetaminophen]; and Tramadol   Social History   Social History  . Marital status: Married    Spouse name: N/A  . Number of children: N/A  . Years of education: N/A   Occupational History  . daycare teacher    Social History Main Topics  . Smoking status: Never Smoker  . Smokeless tobacco: Never Used     Comment: years ago may have smoked 1 cig or less a week  . Alcohol use No  . Drug use: No  . Sexual activity: Not Asked   Other Topics Concern  . None   Social History Narrative  . None     Additional social history is notable in that she  is widowed.  She has children ages 52 and 12.  She is raising a 71 yr old niece.   Family History:  The patient's family history includes Brain cancer in her cousin; Breast cancer in her maternal aunt; Hypertension in her mother.   ROS General: Negative; No fevers, chills, or night sweats;  HEENT: Negative; No changes in vision or hearing, sinus congestion, difficulty swallowing Pulmonary: positive for asthma Cardiovascular: Negative; No chest pain, presyncope, syncope, palpitations GI: Negative; No nausea, vomiting, diarrhea, or abdominal pain GU: Negative; No dysuria, hematuria, or difficulty  voiding Musculoskeletal: Negative; no myalgias, joint pain, or weakness Hematologic/Oncology: Negative; no easy bruising, bleeding Endocrine: positive for diabetes Neuro: Negative; no changes in balance, headaches Skin: Negative; No rashes or skin lesions Psychiatric: Negative; No behavioral problems, depression Sleep: positive for snoring, daytime sleepiness, hypersomnolence; no bruxism, restless legs, hypnogognic hallucinations, no cataplexy Other comprehensive 14 point system review is negative.   PHYSICAL EXAM:   VS:  BP (!) 152/83   Pulse 74   Ht 5' 6"  (1.676 m)   Wt (!) 399 lb 3.2 oz (181.1 kg)   BMI  64.43 kg/m     Repeat BP 139/100 Wt Readings from Last 3 Encounters:  01/02/16 (!) 399 lb 3.2 oz (181.1 kg)  12/09/15 (!) 391 lb (177.4 kg)  11/20/15 (!) 400 lb 9.6 oz (181.7 kg)    General: Alert, oriented, no distress. Super morbid obesity Skin: normal turgor, no rashes, warm and dry HEENT: Normocephalic, atraumatic. Pupils equal round and reactive to light; sclera anicteric; extraocular muscles intact; Fundi no hemorrhages or exudates; disc flat Nose without nasal septal hypertrophy Mouth/Parynx benign; Mallinpatti scale 4 Neck: No JVD, no carotid bruits; normal carotid upstroke Lungs: clear to ausculatation and percussion; no wheezing or rales Chest wall: without tenderness to palpitation Heart: PMI not displaced, RRR, s1 s2 normal, 1/6 systolic murmur, no diastolic murmur, no rubs, gallops, thrills, or heaves Abdomen: soft, nontender; no hepatosplenomehaly, BS+; abdominal aorta nontender and not dilated by palpation. Back: no CVA tenderness Pulses 2+ Musculoskeletal: full range of motion, normal strength, no joint deformities Extremities: trace ankle edema; no clubbing cyanosis, Homan's sign negative  Neurologic: grossly nonfocal; Cranial nerves grossly wnl Psychologic: Normal mood and affect   Studies/Labs Reviewed:   EKG:  EKG is not ordered today.  I have  independently reviewed her prior ECG from 11/14/15  shows normal sinus rhythm at 70 bpm without significant ST segment.  No ectopy.  Recent Labs: BMP Latest Ref Rng & Units 11/12/2015 11/09/2015 11/08/2015  Glucose 65 - 99 mg/dL 134(H) 182(H) 178(H)  BUN 6 - 20 mg/dL 22(H) 16 18  Creatinine 0.44 - 1.00 mg/dL 1.30(H) 1.00 1.14(H)  Sodium 135 - 145 mmol/L 139 140 138  Potassium 3.5 - 5.1 mmol/L 3.4(L) 4.4 3.7  Chloride 101 - 111 mmol/L 104 108 105  CO2 22 - 32 mmol/L 24 23 24   Calcium 8.9 - 10.3 mg/dL 8.8(L) 8.8(L) 8.7(L)     Hepatic Function Latest Ref Rng & Units 10/18/2014 03/25/2014  Total Protein 6.5 - 8.1 g/dL 6.5 8.1  Albumin 3.5 - 5.0 g/dL 3.5 3.7  AST 15 - 41 U/L 18 26  ALT 14 - 54 U/L 19 22  Alk Phosphatase 38 - 126 U/L 68 61  Total Bilirubin 0.3 - 1.2 mg/dL 0.6 0.6    CBC Latest Ref Rng & Units 11/12/2015 11/09/2015 11/08/2015  WBC 4.0 - 10.5 K/uL 10.6(H) 11.9(H) 9.3  Hemoglobin 12.0 - 15.0 g/dL 12.7 11.5(L) 12.0  Hematocrit 36.0 - 46.0 % 38.0 35.4(L) 37.2  Platelets 150 - 400 K/uL 290 283 266   Lab Results  Component Value Date   MCV 85.2 11/12/2015   MCV 85.9 11/09/2015   MCV 86.9 11/08/2015   No results found for: TSH Lab Results  Component Value Date   HGBA1C 6.6 (H) 03/24/2014     BNP    Component Value Date/Time   BNP 17.2 11/12/2015 1815    ProBNP    Component Value Date/Time   PROBNP 36.0 12/31/2013 0935     Lipid Panel  No results found for: CHOL, TRIG, HDL, CHOLHDL, VLDL, LDLCALC, LDLDIRECT   RADIOLOGY: No results found.   Additional studies/ records that were reviewed today include:  I reviewed her office records, ECG, and sleep study as well a of her CPAP use since set up.    ASSESSMENT:    1. OSA (obstructive sleep apnea)   2. Essential hypertension   3. Morbid obesity due to excess calories ([place])   4. Type 2 diabetes mellitus without complication, with long-term current use of insulin ([place])  PLAN:  Ms. Haruko Mersch is a 45 year old female with a BMI of 64.43 , which is compatible with super morbid obesity and has a history of poorly controlled hypertension, asthma and diabetes. Her blood pressure today is elevated  on valsartan 160 mg daily in adddition to amlodipine 10 mg and chlothalidone 25 mg.  She also has a history of asthma. I had a long discussion with  her and reviewed her sleep study which demonstrated moderate sleep apnea overall but severe sleep apnea during REM sleep.  She had significant oxygen  desaturation to a nadir of 66%. I reviewed her sleep study and CPAP titration trial in detail.  I spent considerable time discussing the impact of untreated OSA on cardiovascular health, particularly its effect on resistant hypertension,  PAF, and potential for nocturnal myocardial infarction and TIA/CVA.  I discussed with her that the preponderance REM sleep occurs in the second half of the night, and that during REM sleep, her AHI was 66 per hour. I discussed the impact of sleep apnea on insulin resistance, GERD, and the effect of obesity contributing to its severity. I discussed that she should be sleep 7-8 hours per night and that she should use CPAP in its entirety. She has been falling asleep without h typically ha sleeping for  I discussed good sleep hygiene.  She uses [employer] for DME company and has a full face mask. She is considering  evaluation for gastric bypass surgery.  I have recommended she further increase valsartan to 320 mg daily for improved BP cpntrol and reviewed with her the most recent change in guidelines.  She continues to also be on chlorthalidone and amlodipine.  She admits to occasional ankle swelling.  I will obtain a new download in 4 weeks to re-asses compliance and will see her in follow-up.   Medication Adjustments/Labs and Tests Ordered: Current medicines are reviewed at length with the patient today.  Concerns regarding medicines are outlined above.  Medication  changes, Labs and Tests ordered today are listed in the Patient Instructions below. Patient Instructions  Your physician has recommended you make the following change in your medication:   1.) the valsartan has been increased to 320 mg. Daily.  Your physician recommends that you schedule a follow-up appointment in: 6 weeks with Dr Claiborne Billings.   Time spent: 40 minutes  Signed, Shelva Majestic, MD  01/04/2016 10:19 PM    [facility] [address] [place], [place], [place]  [address] Phone: [phone]

## 2016-01-07 NOTE — Telephone Encounter (Signed)
MR the pts insurance will not cover the breo with a PA---do you want this initiated or would you like to change the medication?  Please advise. thanks

## 2016-01-09 NOTE — Telephone Encounter (Signed)
She can try high dsoe advair , or dulera if insurance will cover that. If not airduo . If not do prior auth for breo  Dr. Kalman ShanMurali Savio Albrecht, M.D., [facility].C.C.P Pulmonary and Critical Care Medicine Staff Physician [facility] Pager: [phone], If no answer or between  15:00h - 7:00h: call [phone]  01/09/2016 7:09 PM

## 2016-01-13 NOTE — Telephone Encounter (Signed)
Attempted to contact the pt. This number has been changed or disconnected.

## 2016-01-20 NOTE — Pre-Procedure Instructions (Addendum)
Melanie Hess  01/20/2016      [facility] [address] [place],  - [address] AT [facility] [address] [place] [address] Phone: [phone] Fax: [phone]    Your procedure is scheduled on Tues., Jan. 9  Report to [facility] Admitting at 8:25 A.M.  Call this number if you have problems the morning of surgery:  [phone]   Remember:  Do not eat food or drink liquids after midnight on Mon. Jan. 8   Take these medicines the morning of surgery with A SIP OF WATER : tylenol if needed, albuterol inhaler if needed,(bring with you) albuterol nebulizer, amlodipine (norvasc), breo inhaler-bring to hospital   STOP all herbel meds, nsaids (aleve,naproxen,advil,ibuprofen) starting TODAY including aspirin, all vitamins    How to Manage Your Diabetes Before and After Surgery  Why is it important to control my blood sugar before and after surgery? . Improving blood sugar levels before and after surgery helps healing and can limit problems. . A way of improving blood sugar control is eating a healthy diet by: o  Eating less sugar and carbohydrates o  Increasing activity/exercise o  Talking with your doctor about reaching your blood sugar goals . High blood sugars (greater than 180 mg/dL) can raise your risk of infections and slow your recovery, so you will need to focus on controlling your diabetes during the weeks before surgery. . Make sure that the doctor who takes care of your diabetes knows about your planned surgery including the date and location.  How do I manage my blood sugar before surgery? . Check your blood sugar at least 4 times a day, starting 2 days before surgery, to make sure that the level is not too high or low. o Check your blood sugar the morning of your surgery when you wake up and every 2 hours until you get to the Short Stay unit. . If your blood sugar is less than 70 mg/dL, you  will need to treat for low blood sugar: o Do not take insulin. o Treat a low blood sugar (less than 70 mg/dL) with  cup of clear juice (cranberry or apple), 4 glucose tablets, OR glucose gel. o Recheck blood sugar in 15 minutes after treatment (to make sure it is greater than 70 mg/dL). If your blood sugar is not greater than 70 mg/dL on recheck, call [phone] for further instructions. . Report your blood sugar to the short stay nurse when you get to Short Stay.  . If you are admitted to the hospital after surgery: o Your blood sugar will be checked by the staff and you will probably be given insulin after surgery (instead of oral diabetes medicines) to make sure you have good blood sugar levels. o The goal for blood sugar control after surgery is 80-180 mg/dL.   WHAT DO I DO ABOUT MY DIABETES MEDICATION?   Marland Kitchen Take toujeo day prior to surgery as usual  take usual doses of victoza day prior to surgery     THE MORNING OF SURGERY, take toujeo 22 units   Do not take victoza day of surgery   Do not wear jewelry, make-up or nail polish.  Do not wear lotions, powders, or perfumes, or deoderant.  Do not shave 48 hours prior to surgery.  Men may shave face and neck.  Do not bring valuables to the hospital.  [facility] is not responsible  for any belongings or valuables.  Contacts, dentures or bridgework may not be worn into surgery.  Leave your suitcase in the car.  After surgery it may be brought to your room.  For patients admitted to the hospital, discharge time will be determined by your treatment team.  Patients discharged the day of surgery will not be allowed to drive home.    Special instructions  Special Instructions: [facility] - Preparing for Surgery  Before surgery, you can play an important role.  Because skin is not sterile, your skin needs to be as free of germs as possible.  You can reduce the number of germs on you skin by washing with CHG (chlorahexidine gluconate)  soap before surgery.  CHG is an antiseptic cleaner which kills germs and bonds with the skin to continue killing germs even after washing.  Please DO NOT use if you have an allergy to CHG or antibacterial soaps.  If your skin becomes reddened/irritated stop using the CHG and inform your nurse when you arrive at Short Stay.  Do not shave (including legs and underarms) for at least 48 hours prior to the first CHG shower.  You may shave your face.  Please follow these instructions carefully:   1.  Shower with CHG Soap the night before surgery and the morning of Surgery.  2.  If you choose to wash your hair, wash your hair first as usual with your normal shampoo.  3.  After you shampoo, rinse your hair and body thoroughly to remove the Shampoo.  4.  Use CHG as you would any other liquid soap.  You can apply chg directly  to the skin and wash gently with scrungie or a clean washcloth.  5.  Apply the CHG Soap to your body ONLY FROM THE NECK DOWN.  Do not use on open wounds or open sores.  Avoid contact with your eyes ears, mouth and genitals (private parts).  Wash genitals (private parts)       with your normal soap.  6.  Wash thoroughly, paying special attention to the area where your surgery will be performed.  7.  Thoroughly rinse your body with warm water from the neck down.  8.  DO NOT shower/wash with your normal soap after using and rinsing off the CHG Soap.  9.  Pat yourself dry with a clean towel.            10.  Wear clean pajamas.            11.  Place clean sheets on your bed the night of your first shower and do not sleep with pets.  Day of Surgery  Do not apply any lotions/deodorants the morning of surgery.  Please wear clean clothes to the hospital/surgery center.  Please read over the following fact sheets that you were given.

## 2016-01-21 ENCOUNTER — Encounter (HOSPITAL_COMMUNITY): Payer: Self-pay

## 2016-01-21 ENCOUNTER — Encounter (HOSPITAL_COMMUNITY)
Admission: RE | Admit: 2016-01-21 | Discharge: 2016-01-21 | Disposition: A | Discharge status: Home / Self Care | Payer: Medicaid Other | Source: Ambulatory Visit | Attending: Orthopaedic Surgery | Admitting: Orthopaedic Surgery

## 2016-01-21 DIAGNOSIS — Z01812 Encounter for preprocedural laboratory examination: Secondary | ICD-10-CM | POA: Insufficient documentation

## 2016-01-21 DIAGNOSIS — E119 Type 2 diabetes mellitus without complications: Secondary | ICD-10-CM | POA: Diagnosis present

## 2016-01-21 HISTORY — DX: Pneumonia, unspecified organism: J18.9

## 2016-01-21 HISTORY — DX: Zoster without complications: B02.9

## 2016-01-21 LAB — BASIC METABOLIC PANEL
ANION GAP: 8 (ref 5–15)
BUN: 9 mg/dL (ref 6–20)
CO2: 28 mmol/L (ref 22–32)
Calcium: 9.1 mg/dL (ref 8.9–10.3)
Chloride: 104 mmol/L (ref 101–111)
Creatinine, Ser: 1.02 mg/dL — ABNORMAL HIGH (ref 0.44–1.00)
Glucose, Bld: 116 mg/dL — ABNORMAL HIGH (ref 65–99)
POTASSIUM: 4.3 mmol/L (ref 3.5–5.1)
SODIUM: 140 mmol/L (ref 135–145)

## 2016-01-21 LAB — CBC
HEMATOCRIT: 36.8 % (ref 36.0–46.0)
HEMOGLOBIN: 12.2 g/dL (ref 12.0–15.0)
MCH: 28.4 pg (ref 26.0–34.0)
MCHC: 33.2 g/dL (ref 30.0–36.0)
MCV: 85.8 fL (ref 78.0–100.0)
Platelets: 300 10*3/uL (ref 150–400)
RBC: 4.29 MIL/uL (ref 3.87–5.11)
RDW: 16 % — ABNORMAL HIGH (ref 11.5–15.5)
WBC: 7.7 10*3/uL (ref 4.0–10.5)

## 2016-01-21 LAB — GLUCOSE, CAPILLARY: GLUCOSE-CAPILLARY: 120 mg/dL — AB (ref 65–99)

## 2016-01-21 LAB — HCG, SERUM, QUALITATIVE: Preg, Serum: NEGATIVE

## 2016-01-22 ENCOUNTER — Ambulatory Visit (HOSPITAL_COMMUNITY)
Admission: EM | Admit: 2016-01-22 | Discharge: 2016-01-22 | Disposition: A | Discharge status: Home / Self Care | Payer: Medicaid Other | Attending: Emergency Medicine | Admitting: Emergency Medicine

## 2016-01-22 ENCOUNTER — Ambulatory Visit (HOSPITAL_COMMUNITY): Payer: Medicaid Other

## 2016-01-22 ENCOUNTER — Encounter (HOSPITAL_COMMUNITY): Payer: Self-pay | Admitting: Neurology

## 2016-01-22 DIAGNOSIS — J9811 Atelectasis: Secondary | ICD-10-CM | POA: Diagnosis not present

## 2016-01-22 DIAGNOSIS — G4733 Obstructive sleep apnea (adult) (pediatric): Secondary | ICD-10-CM | POA: Diagnosis not present

## 2016-01-22 DIAGNOSIS — Z7951 Long term (current) use of inhaled steroids: Secondary | ICD-10-CM | POA: Insufficient documentation

## 2016-01-22 DIAGNOSIS — K449 Diaphragmatic hernia without obstruction or gangrene: Secondary | ICD-10-CM | POA: Insufficient documentation

## 2016-01-22 DIAGNOSIS — M545 Low back pain, unspecified: Secondary | ICD-10-CM

## 2016-01-22 DIAGNOSIS — Z794 Long term (current) use of insulin: Secondary | ICD-10-CM | POA: Insufficient documentation

## 2016-01-22 DIAGNOSIS — J4540 Moderate persistent asthma, uncomplicated: Secondary | ICD-10-CM | POA: Diagnosis not present

## 2016-01-22 DIAGNOSIS — R109 Unspecified abdominal pain: Secondary | ICD-10-CM | POA: Insufficient documentation

## 2016-01-22 DIAGNOSIS — E119 Type 2 diabetes mellitus without complications: Secondary | ICD-10-CM | POA: Insufficient documentation

## 2016-01-22 DIAGNOSIS — F419 Anxiety disorder, unspecified: Secondary | ICD-10-CM | POA: Insufficient documentation

## 2016-01-22 DIAGNOSIS — Z79899 Other long term (current) drug therapy: Secondary | ICD-10-CM | POA: Insufficient documentation

## 2016-01-22 DIAGNOSIS — Z6841 Body Mass Index (BMI) 40.0 and over, adult: Secondary | ICD-10-CM | POA: Diagnosis not present

## 2016-01-22 DIAGNOSIS — I10 Essential (primary) hypertension: Secondary | ICD-10-CM | POA: Diagnosis not present

## 2016-01-22 LAB — URINALYSIS, ROUTINE W REFLEX MICROSCOPIC
BILIRUBIN URINE: NEGATIVE
Glucose, UA: NEGATIVE mg/dL
HGB URINE DIPSTICK: NEGATIVE
Ketones, ur: NEGATIVE mg/dL
Leukocytes, UA: NEGATIVE
Nitrite: NEGATIVE
PH: 5 (ref 5.0–8.0)
Protein, ur: NEGATIVE mg/dL
SPECIFIC GRAVITY, URINE: 1.019 (ref 1.005–1.030)

## 2016-01-22 LAB — BASIC METABOLIC PANEL
Anion gap: 9 (ref 5–15)
BUN: 9 mg/dL (ref 6–20)
CHLORIDE: 104 mmol/L (ref 101–111)
CO2: 25 mmol/L (ref 22–32)
CREATININE: 1.02 mg/dL — AB (ref 0.44–1.00)
Calcium: 9.1 mg/dL (ref 8.9–10.3)
GFR calc Af Amer: >60 mL/min (ref >60)
GFR calc non Af Amer: >60 mL/min (ref >60)
GLUCOSE: 107 mg/dL — AB (ref 65–99)
Potassium: 4.3 mmol/L (ref 3.5–5.1)
SODIUM: 138 mmol/L (ref 135–145)

## 2016-01-22 LAB — CBC WITH DIFFERENTIAL/PLATELET
Basophils Absolute: 0 10*3/uL (ref 0.0–0.1)
Basophils Relative: 0 %
EOS ABS: 0.2 10*3/uL (ref 0.0–0.7)
Eosinophils Relative: 3 %
HEMATOCRIT: 37.7 % (ref 36.0–46.0)
HEMOGLOBIN: 12 g/dL (ref 12.0–15.0)
LYMPHS ABS: 1.8 10*3/uL (ref 0.7–4.0)
Lymphocytes Relative: 30 %
MCH: 27.9 pg (ref 26.0–34.0)
MCHC: 31.8 g/dL (ref 30.0–36.0)
MCV: 87.7 fL (ref 78.0–100.0)
Monocytes Absolute: 0.3 10*3/uL (ref 0.1–1.0)
Monocytes Relative: 5 %
NEUTROS PCT: 62 %
Neutro Abs: 3.9 10*3/uL (ref 1.7–7.7)
Platelets: 276 10*3/uL (ref 150–400)
RBC: 4.3 MIL/uL (ref 3.87–5.11)
RDW: 16.2 % — ABNORMAL HIGH (ref 11.5–15.5)
WBC: 6.2 10*3/uL (ref 4.0–10.5)

## 2016-01-22 LAB — LIPASE, BLOOD: Lipase: 27 U/L (ref 11–51)

## 2016-01-22 LAB — HEMOGLOBIN A1C
Hgb A1c MFr Bld: 6.5 % — ABNORMAL HIGH (ref 4.8–5.6)
Mean Plasma Glucose: 140 mg/dL

## 2016-01-22 LAB — I-STAT CG4 LACTIC ACID, ED: Lactic Acid, Venous: 1.86 mmol/L (ref 0.5–1.9)

## 2016-01-22 MED ORDER — CYCLOBENZAPRINE HCL 10 MG PO TABS
5.0000 mg | ORAL_TABLET | Freq: Once | ORAL | Status: AC
  Administered 2016-01-22: 5 mg via ORAL
  Filled 2016-01-22: qty 1

## 2016-01-22 MED ORDER — CYCLOBENZAPRINE HCL 10 MG PO TABS: 10.0000 mg | ORAL_TABLET | Freq: Two times a day (BID) | ORAL | 0 refills | Status: DC | PRN

## 2016-01-22 NOTE — ED Provider Notes (Signed)
[facility]-EMERGENCY DEPT Provider Note   CSN: [id number] Arrival date & time: 01/22/16  0915     History   Chief Complaint Chief Complaint  Patient presents with  . Back Pain  . Foot Pain    HPI Melanie Hess is a 46 y.o. female.  The history is provided by the patient and medical records. No language interpreter was used.     45 year old female with a past medical history is documented blue presents today with 2 days of right lower back pain. Patient states onset was after turning a certain way and felt a pop in her right lower back. States that her pain is across her back more focal on the right flank and around into her right lower quadrant. Denies any recent fevers, nausea, vomiting, diarrhea. Denies any urinary retention, loss of bowel control, prenatal numbness or tingling, numbness or weakness in the legs. Does state that she had some urinary incontinence this morning but when she sat down because she is able to go very easily. Denies any previous similar symptoms. Has tried Robaxin at home with no improvement in her symptoms. Moving around and twisting and turning makes her symptoms worse.  Past Medical History:  Diagnosis Date  . Ankle fracture, right 2001  . Anxiety   . Arthritis   . Asthma   . Carpal tunnel syndrome    Bilateral  . Diabetes mellitus without complication (HCC)    Type II  . GERD (gastroesophageal reflux disease)   . Hypertension   . Migraine   . OSA (obstructive sleep apnea)   . Plantar fasciitis of right foot   . Pneumonia    hx  . Shingles 11/2015  . Shortness of breath dyspnea    with walking short distances    Patient Active Problem List   Diagnosis Date Noted  . Moderate persistent asthma 12/09/2015  . Acute respiratory failure with hypoxia (HCC) 11/08/2015  . Medication management 10/23/2015  . OSA on CPAP 10/23/2015  . Pain of right heel 08/01/2015  . Chronic pain   . SOB (shortness of breath)   . Respiratory distress  03/24/2014  . Anemia 03/24/2014  . Diabetes mellitus type 2, controlled, without complications (HCC) 03/24/2014  . Asthma exacerbation 03/24/2014  . Acute respiratory failure (HCC) 03/24/2014  . Acute respiratory distress 03/24/2014  . Asthma 02/28/2014  . Hypertension 02/28/2014  . Morbid obesity (HCC) 02/28/2014  . DOE (dyspnea on exertion) 02/28/2014  . Cough 02/14/2014  . Dyspnea and respiratory abnormality 02/14/2014    Past Surgical History:  Procedure Laterality Date  . CARPAL TUNNEL RELEASE Left   . HEEL SPUR RESECTION Right 08/01/2015   Procedure: HEEL SPUR EXCISIONS;  Surgeon: Marcene Corning, MD;  Location: [facility] OR;  Service: Orthopedics;  Laterality: Right;  . TUBAL LIGATION      OB History    No data available       Home Medications    Prior to Admission medications   Medication Sig Start Date End Date Taking? Authorizing Provider  acetaminophen (TYLENOL) 500 MG tablet Take 2 tablets (1,000 mg total) by mouth every 6 (six) hours as needed. Patient taking differently: Take 1,000 mg by mouth every 6 (six) hours as needed for moderate pain or headache.  05/29/15   Ace Gins Sam, PA-C  albuterol (PROVENTIL HFA;VENTOLIN HFA) 108 (90 Base) MCG/ACT inhaler Inhale 2 puffs into the lungs every 6 (six) hours as needed for wheezing or shortness of breath. 09/09/15   Kalman Shan, MD  albuterol (PROVENTIL) (2.5 MG/3ML) 0.083% nebulizer solution Take 3 mLs (2.5 mg total) by nebulization 2 (two) times daily as needed for wheezing or shortness of breath. Patient taking differently: Take 2.5 mg by nebulization at bedtime.  09/03/15   Tammy S Parrett, NP  amLODipine (NORVASC) 10 MG tablet Take 1 tablet (10 mg total) by mouth daily. 07/29/15   Chrystie NoseKenneth C Hilty, MD  chlorthalidone (HYGROTON) 25 MG tablet Take 1 tablet (25 mg total) by mouth daily. 10/23/15 01/21/16  Chrystie NoseKenneth C Hilty, MD  clonazePAM (KLONOPIN) 1 MG tablet Take 1 mg by mouth 3 (three) times daily as needed for anxiety.     Historical Provider, MD  cyclobenzaprine (FLEXERIL) 10 MG tablet Take 1 tablet (10 mg total) by mouth 2 (two) times daily as needed for muscle spasms. 01/22/16   Madolyn FriezeVijay Daana Petrasek, MD  fluticasone furoate-vilanterol (BREO ELLIPTA) 100-25 MCG/INH AEPB Inhale 1 puff into the lungs daily. Patient not taking: Reported on 01/21/2016 09/03/15   Tammy S Parrett, NP  fluticasone furoate-vilanterol (BREO ELLIPTA) 200-25 MCG/INH AEPB Inhale 1 puff into the lungs daily. 12/09/15   Kalman ShanMurali Ramaswamy, MD  liraglutide (VICTOZA) 18 MG/3ML SOPN Inject 1.8 mg into the skin daily.    Historical Provider, MD  TOUJEO SOLOSTAR 300 UNIT/ML SOPN INJ 45 UNITS [place] ONCE D 12/20/15   Historical Provider, MD  valsartan (DIOVAN) 320 MG tablet Take 1 tablet (320 mg total) by mouth daily. Patient taking differently: Take 320 mg by mouth at bedtime.  01/02/16   Lennette Biharihomas A Kelly, MD    Family History Family History  Problem Relation Age of Onset  . Hypertension Mother   . Breast cancer Maternal Aunt   . Brain cancer Cousin     Social History Social History  Substance Use Topics  . Smoking status: Never Smoker  . Smokeless tobacco: Never Used     Comment: years ago may have smoked 1 cig or less a week  . Alcohol use No     Allergies   Aspirin; Nsaids; Penicillins; Percocet [oxycodone-acetaminophen]; Vicodin [hydrocodone-acetaminophen]; and Tramadol   Review of Systems Review of Systems  Constitutional: Negative for fatigue and fever.  HENT: Negative for ear pain and sore throat.   Respiratory: Negative for cough and shortness of breath.   Cardiovascular: Negative for chest pain and palpitations.  Gastrointestinal: Positive for abdominal pain (radiating from R lower back). Negative for diarrhea, nausea and vomiting.  Genitourinary: Negative for difficulty urinating, dysuria and hematuria.  Musculoskeletal: Positive for back pain (R lower back pain, radiates to RLQ). Negative for arthralgias.  Skin: Negative for color change  and rash.  Neurological: Positive for light-headedness (endorses transient period of light headed sensation in shower). Negative for seizures and syncope.  Psychiatric/Behavioral: Negative for agitation and confusion.  All other systems reviewed and are negative.    Physical Exam Updated Vital Signs BP (!) 123/54   Pulse (!) 59   Temp 98.7 F (37.1 C) (Oral)   Resp 24   Ht 5\' 6"  (1.676 m)   Wt (!) 181.4 kg   LMP 12/23/2015   SpO2 94%   BMI 64.56 kg/m   Physical Exam  Constitutional: No distress.  HENT:  Head: Normocephalic and atraumatic.  Eyes: Conjunctivae and EOM are normal.  Neck: Normal range of motion. Neck supple.  Cardiovascular: Normal rate and regular rhythm.   Pulmonary/Chest: Effort normal and breath sounds normal. No respiratory distress.  Abdominal: Soft. She exhibits no distension. There is no tenderness. There is no guarding.  Obese abd  Musculoskeletal: Normal range of motion. She exhibits tenderness (R paraspinal muscle moving laterally, reproduces pain mentioned in HPI).  Skin: Skin is warm and dry. No rash noted. She is not diaphoretic.  Nursing note and vitals reviewed.    ED Treatments / Results  Labs (all labs ordered are listed, but only abnormal results are displayed) Labs Reviewed  CBC WITH DIFFERENTIAL/PLATELET - Abnormal; Notable for the following:       Result Value   RDW 16.2 (*)    All other components within normal limits  BASIC METABOLIC PANEL - Abnormal; Notable for the following:    Glucose, Bld 107 (*)    Creatinine, Ser 1.02 (*)    All other components within normal limits  URINE CULTURE  URINALYSIS, ROUTINE W REFLEX MICROSCOPIC  LIPASE, BLOOD  I-STAT CG4 LACTIC ACID, ED  I-STAT CG4 LACTIC ACID, ED    EKG  EKG Interpretation None       Radiology Ct Renal Stone Study  Result Date: 01/22/2016 CLINICAL DATA:  Back pain, right lower quadrant pain starting yesterday, right flank pain EXAM: CT ABDOMEN AND PELVIS WITHOUT  CONTRAST TECHNIQUE: Multidetector CT imaging of the abdomen and pelvis was performed following the standard protocol without IV contrast. COMPARISON:  None. FINDINGS: Lower chest: Lung bases shows mild posterior dependent atelectasis. Small hiatal hernia. Hepatobiliary: Unenhanced liver shows no biliary ductal dilatation. No calcified gallstones are noted within gallbladder. Pancreas: Unenhanced pancreas with normal appearance. Spleen: Unenhanced spleen is normal. Adrenals/Urinary Tract: No adrenal gland mass. Unenhanced kidneys are symmetrical in size. No nephrolithiasis. No hydronephrosis or hydroureter. No calcified ureteral calculi. No calcified calculi are noted within urinary bladder. Stomach/Bowel: There is no small bowel obstruction. No pericecal inflammation. The terminal ileum is unremarkable. Normal appendix partially visualized in coronal image 52 Some colonic stool are noted in right colon transverse colon and descending colon. Mild redundant sigmoid colon. Moderate gas noted in proximal sigmoid colon. No distal colitis or diverticulitis. No distal colonic obstruction. Vascular/Lymphatic: No aortic aneurysm.  No adenopathy. Reproductive: The uterus is anteflexed normal size. No adnexal mass. No pelvic mass is noted on this unenhanced scan. Other: No ascites or free abdominal air. Small umbilical hernia containing fat without evidence of acute complication. Musculoskeletal: No destructive bony lesions are noted. Mild degenerative changes lower thoracic spine. Mild degenerative changes bilateral SI joints. Minimal degenerative changes pubic symphysis. IMPRESSION: 1. There is no evidence of nephrolithiasis. No hydronephrosis or hydroureter. 2. No calcified ureteral calculi. 3. Normal appendix partially visualized. No pericecal inflammation. No small bowel or colonic obstruction. 4. No calcified calculi are noted within urinary bladder. 5. No pelvic mass.  No ascites or free abdominal air. Electronically  Signed   By: Natasha Mead M.D.   On: 01/22/2016 10:56    Procedures Procedures (including critical care time)  Medications Ordered in ED Medications  cyclobenzaprine (FLEXERIL) tablet 5 mg (5 mg Oral Given 01/22/16 1115)     Initial Impression / Assessment and Plan / ED Course  I have reviewed the triage vital signs and the nursing notes.  Pertinent labs & imaging results that were available during my care of the patient were reviewed by me and considered in my medical decision making (see chart for details).  Clinical Course     Patient presents to the ED today with right lower back pain radiating into the right lower quadrant. Denies any prior similar symptoms. No acute neurological deficits on my exam. Pain is reproducible to palpation. Differential  includes: Urinary tract infection, kidney stone, appendicitis, paraspinal muscle strain.  Obtain labs which are notable for her creatinine being at baseline. No white blood cell count elevation. Her urine is unremarkable. CT stone study obtained which does not demonstrate any stone, perinephric stranding, or abnormal appendix.  Given her reassuring workup today, fill findings are consistent with muscular strain. I discontinued the patient's Robaxin and we will try a short course of Flexeril and advised early ambulation as tolerated. Patient agreeable with this plan. Discharged home in good condition.  Final Clinical Impressions(s) / ED Diagnoses   Final diagnoses:  Acute right-sided low back pain without sciatica    New Prescriptions Discharge Medication List as of 01/22/2016 11:41 AM    START taking these medications   Details  cyclobenzaprine (FLEXERIL) 10 MG tablet Take 1 tablet (10 mg total) by mouth 2 (two) times daily as needed for muscle spasms., Starting Thu 01/22/2016, Print         Madolyn Frieze, MD 01/22/16 1302    Melene Plan, DO 01/22/16 1335

## 2016-01-22 NOTE — Discharge Instructions (Signed)
Stop your Robaxin and try the Flexeril for the next week to see if this helps with your back pain. Continue to stay as active as possible as this will help over long term with your back pain.   Return to the ER if you develop inability urinate, loss of bowel control, have numbness/weakness in legs, or other worrisome signs or symptoms. Please plan to follow-up with your primary doctor in the next week for re-evaluation.

## 2016-01-22 NOTE — ED Triage Notes (Signed)
Pt reports lower back pain since Tuesday and heel pain to both feet from heel spurs.

## 2016-01-22 NOTE — Progress Notes (Signed)
Anesthesia Chart Review:  Pt is a 46 year old female scheduled for L heel spur excision and L achilles tendon repair on 01/27/2016 with Marcene CorningPeter Dalldorf, MD.   - PCP is Leilani AbleBetti Reese, MD.  - Cardiologist is Zoila ShutterKenneth Hilty, MD, last office visit 10/23/15. Pt also sees Nicki Guadalajarahomas Kelly, MD with cardiology for f/u OSA.  - Pulmonologist is Kalman ShanMurali Ramaswamy, MD, last office visit 12/09/15.   PMH includes:  HTN, DM, OSA, asthma, GERD. Never smoker. BMI 64.5. S/p R heel spur excision 08/01/15.   - Pt hospitalized 10/21-10/23/17 for asthma exacerbation  Medications include: albuterol, amlodipine, chlorthalidone, breo ellipta, liraglutide, toujeo, valsartan   Preoperative labs reviewed.  HgbA1c 6.5, glucose 116  CXR 11/12/15:  Low lung volumes with slightly elevated left diaphragm. No acute infiltrate or edema. Borderline cardiomegaly.  EKG 11/12/15: Sinus rhythm. Borderline short PR interval  Echo 03/26/14:  - Left ventricle: There is a false tendon at the LV apex. The cavity size was normal. There was mild concentric hypertrophy. Systolic function was normal. The estimated ejection fraction was in the range of 55% to 60%. Wall motion was normal; there were noregional wall motion abnormalities. - Mitral valve: There was trivial regurgitation.  Pt tolerated similar procedure last July. If no changes, I anticipate pt can proceed with surgery as scheduled.   Rica Mastngela Imad Shostak, FNP-BC [facility] Phone: [phone] 01/22/2016 12:37 PM

## 2016-01-23 LAB — URINE CULTURE

## 2016-01-26 MED ORDER — VANCOMYCIN HCL 10 G IV SOLR
1500.0000 mg | INTRAVENOUS | Status: AC
  Administered 2016-01-27 (×2): 1500 mg via INTRAVENOUS
  Filled 2016-01-26: qty 1500

## 2016-01-26 MED ORDER — VANCOMYCIN HCL 10 G IV SOLR
1500.0000 mg | INTRAVENOUS | Status: DC
  Filled 2016-01-26: qty 1500

## 2016-01-26 NOTE — H&P (Signed)
Melanie Hess is an 46 y.o. female.   Chief Complaint: left heel pain  HPI: Patient has had a painful left heel for many years.  She had a similar problem on the right which was corrected by surgery about 4 months ago.  She denies any new injury on the left.  She is here today asking for the same procedure to be done on the left  Radiographs:  X-rays that were ordered, performed, and interpreted by me today included 2 views of left heel. She has large insertional spur and prominent pump bumps on the left.  Past Medical History:  Diagnosis Date  . Ankle fracture, right 2001  . Anxiety   . Arthritis   . Asthma   . Carpal tunnel syndrome    Bilateral  . Diabetes mellitus without complication (HCC)    Type II  . GERD (gastroesophageal reflux disease)   . Hypertension   . Migraine   . OSA (obstructive sleep apnea)   . Plantar fasciitis of right foot   . Pneumonia    hx  . Shingles 11/2015  . Shortness of breath dyspnea    with walking short distances    Past Surgical History:  Procedure Laterality Date  . CARPAL TUNNEL RELEASE Left   . HEEL SPUR RESECTION Right 08/01/2015   Procedure: HEEL SPUR EXCISIONS;  Surgeon: Marcene CorningPeter Dalldorf, MD;  [facility];  Service: Orthopedics;  Laterality: Right;  . TUBAL LIGATION      Family History  Problem Relation Age of Onset  . Hypertension Mother   . Breast cancer Maternal Aunt   . Brain cancer Cousin    Social History:  reports that she has never smoked. She has never used smokeless tobacco. She reports that she does not drink alcohol or use drugs.  Allergies:  Allergies  Allergen Reactions  . Aspirin Shortness Of Breath and Swelling  . Nsaids Shortness Of Breath and Swelling  . Penicillins Swelling, Rash and Other (See Comments)    PCN reaction causing immediate rash, facial/tongue/throat swelling, SOB or lightheadedness with hypotension: YES PCN reaction causing severe rash involving mucus membranes or skin necrosis:  YES PCN reaction that required hospitalization NO PCN reaction occurring within the last 10 years: NO   . Percocet [Oxycodone-Acetaminophen] Hives and Itching  . Vicodin [Hydrocodone-Acetaminophen] Hives and Itching  . Tramadol Nausea Only    No prescriptions prior to admission.    No results found for this or any previous visit (from the past 48 hour(s)). No results found.  Review of Systems  Musculoskeletal: Positive for joint pain.       Left heel  All other systems reviewed and are negative.   Last menstrual period 12/23/2015. Physical Exam  Constitutional: She is oriented to person, place, and time. She appears well-developed and well-nourished.  HENT:  Head: Normocephalic and atraumatic.  Eyes: Pupils are equal, round, and reactive to light.  Neck: Normal range of motion.  Cardiovascular: Normal rate.   Respiratory: Effort normal.  GI: Soft.  Musculoskeletal:  Left heel palpable bony prominence.  Tenderness to palpation at the Achilles attachment on the heel.  Skin is benign.  Neurovascularly intact distally.  Neurological: She is alert and oriented to person, place, and time.  Skin: Skin is warm and dry.  Psychiatric: She has a normal mood and affect. Her behavior is normal. Judgment and thought content normal.     Assessment/Plan Assessment:  Left insertional Achilles spurs and pump bump  Plan: Melanie Hess has terrible  pain at her left heel. Dr. Renae Fickle has treated her with injections and inserts in the past. The pain is both at the left Achilles insertion and through the retrocalcaneal region. I don't get any pain to the plantar fascial origin. She has been unable to wear shoes for more than a year. I reviewed risk of anesthesia, infection, and wound healing problems related to a pump bump and Achilles insertional spur excision with Achilles repair. She would have to keep her weight off this side for 2-4 weeks. She is certainly of elevated surgical risk but she is accepting  of this risk and at this point I don't see much of an alternative.  Melanie Hess, Melanie Organ, PA-C 01/26/2016, 9:42 AM

## 2016-01-27 ENCOUNTER — Encounter (HOSPITAL_COMMUNITY): Payer: Self-pay | Admitting: Anesthesiology

## 2016-01-27 ENCOUNTER — Ambulatory Visit (HOSPITAL_COMMUNITY): Payer: Medicaid Other | Admitting: Emergency Medicine

## 2016-01-27 ENCOUNTER — Ambulatory Visit (HOSPITAL_COMMUNITY)
Admission: RE | Admit: 2016-01-27 | Discharge: 2016-01-27 | Disposition: A | Discharge status: Home / Self Care | Payer: Medicaid Other | Source: Ambulatory Visit | Attending: Orthopaedic Surgery | Admitting: Orthopaedic Surgery

## 2016-01-27 ENCOUNTER — Ambulatory Visit (HOSPITAL_COMMUNITY): Payer: Medicaid Other | Admitting: Anesthesiology

## 2016-01-27 ENCOUNTER — Encounter (HOSPITAL_COMMUNITY)
Admission: RE | Disposition: A | Discharge status: Home / Self Care | Payer: Self-pay | Source: Ambulatory Visit | Attending: Orthopaedic Surgery

## 2016-01-27 DIAGNOSIS — M216X1 Other acquired deformities of right foot: Secondary | ICD-10-CM | POA: Insufficient documentation

## 2016-01-27 DIAGNOSIS — Z794 Long term (current) use of insulin: Secondary | ICD-10-CM | POA: Diagnosis not present

## 2016-01-27 DIAGNOSIS — I10 Essential (primary) hypertension: Secondary | ICD-10-CM | POA: Diagnosis not present

## 2016-01-27 DIAGNOSIS — M79672 Pain in left foot: Secondary | ICD-10-CM | POA: Diagnosis present

## 2016-01-27 DIAGNOSIS — J45909 Unspecified asthma, uncomplicated: Secondary | ICD-10-CM | POA: Diagnosis not present

## 2016-01-27 DIAGNOSIS — M7731 Calcaneal spur, right foot: Secondary | ICD-10-CM | POA: Insufficient documentation

## 2016-01-27 DIAGNOSIS — E119 Type 2 diabetes mellitus without complications: Secondary | ICD-10-CM | POA: Insufficient documentation

## 2016-01-27 DIAGNOSIS — F419 Anxiety disorder, unspecified: Secondary | ICD-10-CM | POA: Insufficient documentation

## 2016-01-27 DIAGNOSIS — Z79899 Other long term (current) drug therapy: Secondary | ICD-10-CM | POA: Diagnosis not present

## 2016-01-27 DIAGNOSIS — Z7951 Long term (current) use of inhaled steroids: Secondary | ICD-10-CM | POA: Diagnosis not present

## 2016-01-27 HISTORY — PX: HEEL SPUR RESECTION: SHX6410

## 2016-01-27 LAB — GLUCOSE, CAPILLARY
Glucose-Capillary: 116 mg/dL — ABNORMAL HIGH (ref 65–99)
Glucose-Capillary: 124 mg/dL — ABNORMAL HIGH (ref 65–99)

## 2016-01-27 SURGERY — EXCISION, BONE SPUR, CALCANEUS
Anesthesia: General | Site: Foot | Laterality: Left

## 2016-01-27 MED ORDER — ONDANSETRON HCL 4 MG/2ML IJ SOLN
INTRAMUSCULAR | Status: DC | PRN
  Administered 2016-01-27: 4 mg via INTRAVENOUS

## 2016-01-27 MED ORDER — LIDOCAINE 2% (20 MG/ML) 5 ML SYRINGE
INTRAMUSCULAR | Status: AC
  Filled 2016-01-27: qty 5

## 2016-01-27 MED ORDER — HYDROMORPHONE HCL 2 MG PO TABS
2.0000 mg | ORAL_TABLET | Freq: Once | ORAL | Status: AC
  Administered 2016-01-27: 2 mg via ORAL

## 2016-01-27 MED ORDER — DEXAMETHASONE SODIUM PHOSPHATE 10 MG/ML IJ SOLN
INTRAMUSCULAR | Status: DC | PRN
  Administered 2016-01-27: 10 mg via INTRAVENOUS

## 2016-01-27 MED ORDER — NEOSTIGMINE METHYLSULFATE 10 MG/10ML IV SOLN
INTRAVENOUS | Status: DC | PRN
  Administered 2016-01-27: 2 mg via INTRAVENOUS
  Administered 2016-01-27: 3 mg via INTRAVENOUS

## 2016-01-27 MED ORDER — GLYCOPYRROLATE 0.2 MG/ML IJ SOLN
INTRAMUSCULAR | Status: DC | PRN
  Administered 2016-01-27: 0.4 mg via INTRAVENOUS
  Administered 2016-01-27: 0.2 mg via INTRAVENOUS

## 2016-01-27 MED ORDER — PHENYLEPHRINE HCL 10 MG/ML IJ SOLN
INTRAMUSCULAR | Status: DC | PRN
  Administered 2016-01-27 (×2): 80 ug via INTRAVENOUS
  Administered 2016-01-27: 40 ug via INTRAVENOUS
  Administered 2016-01-27 (×2): 80 ug via INTRAVENOUS
  Administered 2016-01-27: 40 ug via INTRAVENOUS
  Administered 2016-01-27: 80 ug via INTRAVENOUS
  Administered 2016-01-27: 40 ug via INTRAVENOUS

## 2016-01-27 MED ORDER — LIDOCAINE HCL (CARDIAC) 20 MG/ML IV SOLN
INTRAVENOUS | Status: DC | PRN
  Administered 2016-01-27: 100 mg via INTRAVENOUS

## 2016-01-27 MED ORDER — DEXAMETHASONE SODIUM PHOSPHATE 10 MG/ML IJ SOLN
INTRAMUSCULAR | Status: AC
  Filled 2016-01-27: qty 1

## 2016-01-27 MED ORDER — PROPOFOL 10 MG/ML IV BOLUS
INTRAVENOUS | Status: DC | PRN
  Administered 2016-01-27: 200 mg via INTRAVENOUS

## 2016-01-27 MED ORDER — FENTANYL CITRATE (PF) 100 MCG/2ML IJ SOLN
INTRAMUSCULAR | Status: AC
  Filled 2016-01-27: qty 4

## 2016-01-27 MED ORDER — BUPIVACAINE HCL (PF) 0.5 % IJ SOLN
INTRAMUSCULAR | Status: DC | PRN
  Administered 2016-01-27: 15 mL

## 2016-01-27 MED ORDER — FENTANYL CITRATE (PF) 100 MCG/2ML IJ SOLN
INTRAMUSCULAR | Status: DC | PRN
  Administered 2016-01-27: 50 ug via INTRAVENOUS
  Administered 2016-01-27: 75 ug via INTRAVENOUS
  Administered 2016-01-27: 25 ug via INTRAVENOUS

## 2016-01-27 MED ORDER — 0.9 % SODIUM CHLORIDE (POUR BTL) OPTIME
TOPICAL | Status: DC | PRN
  Administered 2016-01-27: 1000 mL

## 2016-01-27 MED ORDER — HYDROMORPHONE HCL 2 MG PO TABS
ORAL_TABLET | ORAL | Status: AC
  Administered 2016-01-27: 2 mg
  Filled 2016-01-27: qty 1

## 2016-01-27 MED ORDER — HYDROMORPHONE HCL 2 MG PO TABS: 2.0000 mg | ORAL_TABLET | ORAL | 0 refills | Status: DC | PRN

## 2016-01-27 MED ORDER — PHENYLEPHRINE 40 MCG/ML (10ML) SYRINGE FOR IV PUSH (FOR BLOOD PRESSURE SUPPORT)
PREFILLED_SYRINGE | INTRAVENOUS | Status: AC
  Filled 2016-01-27: qty 10

## 2016-01-27 MED ORDER — ROCURONIUM BROMIDE 50 MG/5ML IV SOSY
PREFILLED_SYRINGE | INTRAVENOUS | Status: AC
  Filled 2016-01-27: qty 5

## 2016-01-27 MED ORDER — ONDANSETRON HCL 4 MG/2ML IJ SOLN
INTRAMUSCULAR | Status: AC
  Filled 2016-01-27: qty 2

## 2016-01-27 MED ORDER — BUPIVACAINE HCL (PF) 0.5 % IJ SOLN
INTRAMUSCULAR | Status: AC
  Filled 2016-01-27: qty 30

## 2016-01-27 MED ORDER — ROCURONIUM BROMIDE 100 MG/10ML IV SOLN
INTRAVENOUS | Status: DC | PRN
  Administered 2016-01-27: 50 mg via INTRAVENOUS

## 2016-01-27 MED ORDER — EPHEDRINE SULFATE 50 MG/ML IJ SOLN
INTRAMUSCULAR | Status: DC | PRN
  Administered 2016-01-27: 10 mg via INTRAVENOUS

## 2016-01-27 MED ORDER — CHLORHEXIDINE GLUCONATE 4 % EX LIQD: 60.0000 mL | Freq: Once | CUTANEOUS | Status: DC

## 2016-01-27 MED ORDER — FENTANYL CITRATE (PF) 100 MCG/2ML IJ SOLN
INTRAMUSCULAR | Status: AC
  Filled 2016-01-27: qty 2

## 2016-01-27 MED ORDER — LACTATED RINGERS IV SOLN
INTRAVENOUS | Status: DC
  Administered 2016-01-27 (×2): via INTRAVENOUS

## 2016-01-27 MED ORDER — MIDAZOLAM HCL 5 MG/5ML IJ SOLN
INTRAMUSCULAR | Status: DC | PRN
  Administered 2016-01-27: 2 mg via INTRAVENOUS

## 2016-01-27 MED ORDER — FENTANYL CITRATE (PF) 100 MCG/2ML IJ SOLN
25.0000 ug | INTRAMUSCULAR | Status: DC | PRN
  Administered 2016-01-27: 50 ug via INTRAVENOUS

## 2016-01-27 MED ORDER — MIDAZOLAM HCL 2 MG/2ML IJ SOLN
INTRAMUSCULAR | Status: AC
  Filled 2016-01-27: qty 2

## 2016-01-27 SURGICAL SUPPLY — 72 items
ANCH SUT 2 2.9 2 LD TPR NDL (Anchor) ×2 IMPLANT
ANCH SUT KNTLS STRL SHLDR SYS (Anchor) ×2 IMPLANT
ANCHOR JUGGERKNOT WTAP NDL 2.9 (Anchor) ×4 IMPLANT
ANCHOR SUT QUATTRO KNTLS 4.5 (Anchor) ×4 IMPLANT
APL SKNCLS STERI-STRIP NONHPOA (GAUZE/BANDAGES/DRESSINGS)
BANDAGE ACE 4X5 VEL STRL LF (GAUZE/BANDAGES/DRESSINGS) ×1 IMPLANT
BANDAGE ACE 6X5 VEL STRL LF (GAUZE/BANDAGES/DRESSINGS) ×3 IMPLANT
BANDAGE ELASTIC 4 VELCRO ST LF (GAUZE/BANDAGES/DRESSINGS) ×2 IMPLANT
BANDAGE ESMARK 6X9 LF (GAUZE/BANDAGES/DRESSINGS) ×1 IMPLANT
BENZOIN TINCTURE PRP APPL 2/3 (GAUZE/BANDAGES/DRESSINGS) ×1 IMPLANT
BIT DRILL JUGRKNT W/NDL BIT2.9 (DRILL) IMPLANT
BLADE LONG MED 31MMX9MM (MISCELLANEOUS) ×1
BLADE LONG MED 31X9 (MISCELLANEOUS) ×1 IMPLANT
BNDG CMPR 9X4 STRL LF SNTH (GAUZE/BANDAGES/DRESSINGS) ×1
BNDG CMPR 9X6 STRL LF SNTH (GAUZE/BANDAGES/DRESSINGS) ×1
BNDG COHESIVE 4X5 TAN STRL (GAUZE/BANDAGES/DRESSINGS) ×1 IMPLANT
BNDG ESMARK 4X9 LF (GAUZE/BANDAGES/DRESSINGS) ×3 IMPLANT
BNDG ESMARK 6X9 LF (GAUZE/BANDAGES/DRESSINGS) ×3
BNDG GAUZE ELAST 4 BULKY (GAUZE/BANDAGES/DRESSINGS) ×3 IMPLANT
CLOSURE WOUND 1/2 X4 (GAUZE/BANDAGES/DRESSINGS) ×1
CUFF TOURNIQUET SINGLE 34IN LL (TOURNIQUET CUFF) IMPLANT
CUFF TOURNIQUET SINGLE 44IN (TOURNIQUET CUFF) ×2 IMPLANT
DRAPE OEC MINIVIEW 54X84 (DRAPES) ×2 IMPLANT
DRAPE U-SHAPE 47X51 STRL (DRAPES) ×3 IMPLANT
DRILL JUGGERKNOT W/NDL BIT 2.9 (DRILL) ×3
DRSG EMULSION OIL 3X3 NADH (GAUZE/BANDAGES/DRESSINGS) ×3 IMPLANT
DRSG PAD ABDOMINAL 8X10 ST (GAUZE/BANDAGES/DRESSINGS) ×2 IMPLANT
DURAPREP 26ML APPLICATOR (WOUND CARE) ×5 IMPLANT
ELECT REM PT RETURN 9FT ADLT (ELECTROSURGICAL) ×3
ELECTRODE REM PT RTRN 9FT ADLT (ELECTROSURGICAL) ×1 IMPLANT
GAUZE SPONGE 4X4 12PLY STRL (GAUZE/BANDAGES/DRESSINGS) ×3 IMPLANT
GLOVE BIO SURGEON STRL SZ8 (GLOVE) ×12 IMPLANT
GLOVE BIOGEL PI IND STRL 8 (GLOVE) ×1 IMPLANT
GLOVE BIOGEL PI INDICATOR 8 (GLOVE) ×2
GOWN STRL REUS W/ TWL LRG LVL3 (GOWN DISPOSABLE) ×1 IMPLANT
GOWN STRL REUS W/ TWL XL LVL3 (GOWN DISPOSABLE) ×3 IMPLANT
GOWN STRL REUS W/TWL 2XL LVL3 (GOWN DISPOSABLE) ×3 IMPLANT
GOWN STRL REUS W/TWL LRG LVL3 (GOWN DISPOSABLE) ×3
GOWN STRL REUS W/TWL XL LVL3 (GOWN DISPOSABLE) ×9
KIT BASIN OR (CUSTOM PROCEDURE TRAY) ×3 IMPLANT
KIT ROOM TURNOVER OR (KITS) ×3 IMPLANT
NDL 1/2 CIR CATGUT .05X1.09 (NEEDLE) IMPLANT
NEEDLE 1/2 CIR CATGUT .05X1.09 (NEEDLE) ×3 IMPLANT
NEEDLE 22X1 1/2 (OR ONLY) (NEEDLE) ×3 IMPLANT
NS IRRIG 1000ML POUR BTL (IV SOLUTION) ×3 IMPLANT
PACK ORTHO EXTREMITY (CUSTOM PROCEDURE TRAY) ×3 IMPLANT
PAD ARMBOARD 7.5X6 YLW CONV (MISCELLANEOUS) ×6 IMPLANT
PAD CAST 4YDX4 CTTN HI CHSV (CAST SUPPLIES) ×1 IMPLANT
PADDING CAST COTTON 4X4 STRL (CAST SUPPLIES)
PADDING CAST COTTON 6X4 STRL (CAST SUPPLIES) ×3 IMPLANT
SPLINT PLASTER CAST XFAST 5X30 (CAST SUPPLIES) IMPLANT
SPLINT PLASTER XFAST SET 5X30 (CAST SUPPLIES) ×2
STAPLER VISISTAT 35W (STAPLE) ×3 IMPLANT
STRIP CLOSURE SKIN 1/2X4 (GAUZE/BANDAGES/DRESSINGS) ×2 IMPLANT
SUT ETHIBOND 2 OS 4 DA (SUTURE) IMPLANT
SUT FIBERWIRE #2 38 T-5 BLUE (SUTURE) ×6
SUT MNCRL AB 4-0 PS2 18 (SUTURE) ×2 IMPLANT
SUT PROLENE 3 0 PS 2 (SUTURE) IMPLANT
SUT PROLENE 4 0 PS 2 18 (SUTURE) IMPLANT
SUT VIC AB 0 CT1 27 (SUTURE) ×3
SUT VIC AB 0 CT1 27XBRD ANBCTR (SUTURE) IMPLANT
SUT VIC AB 2-0 CT1 27 (SUTURE) ×3
SUT VIC AB 2-0 CT1 TAPERPNT 27 (SUTURE) IMPLANT
SUT VIC AB 2-0 SH 27 (SUTURE)
SUT VIC AB 2-0 SH 27XBRD (SUTURE) IMPLANT
SUT VIC AB 3-0 FS2 27 (SUTURE) IMPLANT
SUTURE FIBERWR #2 38 T-5 BLUE (SUTURE) IMPLANT
SYR 20CC LL (SYRINGE) ×3 IMPLANT
TUBE CONNECTING 12'X1/4 (SUCTIONS)
TUBE CONNECTING 12X1/4 (SUCTIONS) IMPLANT
UNDERPAD 30X30 (UNDERPADS AND DIAPERS) ×3 IMPLANT
WATER STERILE IRR 1000ML POUR (IV SOLUTION) ×3 IMPLANT

## 2016-01-27 NOTE — Anesthesia Postprocedure Evaluation (Signed)
Anesthesia Post Note  Patient: Melanie MaxwellCheryl A Hess  Procedure(s) Performed: Procedure(s) (LRB): LEFT HEEL SPUR EXCISION AND TENDON ACHILLIES REPAIR (Left)  Patient location during evaluation: PACU Anesthesia Type: General Level of consciousness: awake Pain management: satisfactory to patient Vital Signs Assessment: post-procedure vital signs reviewed and stable Respiratory status: spontaneous breathing Cardiovascular status: stable Anesthetic complications: no Comments: Needed PACU ant-emetics despite PONV risk       Last Vitals:  Vitals:   01/27/16 1309 01/27/16 1334  BP: 134/62   Pulse:  (!) 55  Resp:  15  Temp:  36.3 C    Last Pain:  Vitals:   01/27/16 1334  TempSrc:   PainSc: 5     LLE Motor Response: Purposeful movement;Responds to commands (01/27/16 1334) LLE Sensation: Full sensation (01/27/16 1334)          Masaichi Kracht EDWARD

## 2016-01-27 NOTE — Interval H&P Note (Signed)
History and Physical Interval Note:  01/27/2016 9:34 AM  Melanie Hess  has presented today for surgery, with the diagnosis of LEFT HEEL INSERTIONAL SPUR AND PUMP BUMP   The various methods of treatment have been discussed with the patient and family. After consideration of risks, benefits and other options for treatment, the patient has consented to  Procedure(s) with comments: LEFT HEEL SPUR EXCISION AND TENDON ACHILLIES REPAIR (Left) - PRONE POSITION as a surgical intervention .  The patient's history has been reviewed, patient examined, no change in status, stable for surgery.  I have reviewed the patient's chart and labs.  Questions were answered to the patient's satisfaction.     [name]

## 2016-01-27 NOTE — Progress Notes (Signed)
Report to M. Brande RN-lunch relief

## 2016-01-27 NOTE — Anesthesia Preprocedure Evaluation (Signed)
Anesthesia Evaluation  Patient identified by MRN, date of birth, ID band Patient awake    Reviewed: Allergy & Precautions, NPO status , Patient's Chart, lab work & pertinent test results  History of Anesthesia Complications Negative for: history of anesthetic complications  Airway Mallampati: III  TM Distance: >3 FB Neck ROM: Full    Dental  (+) Teeth Intact, Missing,    Pulmonary shortness of breath, asthma , sleep apnea ,    breath sounds clear to auscultation       Cardiovascular hypertension, Pt. on medications (-) angina+ DOE  (-) Past MI and (-) CHF  Rhythm:Regular     Neuro/Psych  Headaches, Anxiety  Neuromuscular disease    GI/Hepatic Neg liver ROS, GERD  Controlled,  Endo/Other  diabetes, Type 2, Oral Hypoglycemic AgentsMorbid obesity  Renal/GU negative Renal ROS     Musculoskeletal  (+) Arthritis ,   Abdominal   Peds  Hematology   Anesthesia Other Findings MO EASY DL prior   Reproductive/Obstetrics                             Anesthesia Physical  Anesthesia Plan  ASA: III  Anesthesia Plan: General   Post-op Pain Management:    Induction: Intravenous  Airway Management Planned: Oral ETT and Video Laryngoscope Planned  Additional Equipment: None  Intra-op Plan:   Post-operative Plan: Extubation in OR  Informed Consent: I have reviewed the patients History and Physical, chart, labs and discussed the procedure including the risks, benefits and alternatives for the proposed anesthesia with the patient or authorized representative who has indicated his/her understanding and acceptance.   Dental advisory given  Plan Discussed with: CRNA and Surgeon  Anesthesia Plan Comments:         Anesthesia Quick Evaluation

## 2016-01-27 NOTE — Op Note (Signed)
#  [id number] 

## 2016-01-27 NOTE — Transfer of Care (Signed)
Immediate Anesthesia Transfer of Care Note  Patient: Melanie Hess  Procedure(s) Performed: Procedure(s) with comments: LEFT HEEL SPUR EXCISION AND TENDON ACHILLIES REPAIR (Left) - PRONE POSITION  Patient Location: PACU  Anesthesia Type:General  Level of Consciousness: awake, alert , oriented and patient cooperative  Airway & Oxygen Therapy: Patient Spontanous Breathing and Patient connected to nasal cannula oxygen  Post-op Assessment: Report given to RN and Post -op Vital signs reviewed and stable  Post vital signs: Reviewed and stable  Last Vitals:  Vitals:   01/27/16 0837  BP: (!) 154/93  Pulse: 63  Resp: 20  Temp: 36.8 C    Last Pain:  Vitals:   01/27/16 0837  TempSrc: Oral      Patients Stated Pain Goal: 3 (01/27/16 0935)  Complications: No apparent anesthesia complications

## 2016-01-28 ENCOUNTER — Encounter (HOSPITAL_COMMUNITY): Payer: Self-pay | Admitting: Orthopaedic Surgery

## 2016-01-28 NOTE — Op Note (Signed)
NAMEMERYEM, HAERTEL NO.:  [id number]  MEDICAL RECORD NO.:  [id number]  LOCATION:  [facility]                         FACILITY:  [facility]  PHYSICIAN:  Lubertha Basque. Kenith Trickel, M.D.DATE OF BIRTH:  21-Apr-1970  DATE OF PROCEDURE:  01/27/2016 DATE OF DISCHARGE:                              OPERATIVE REPORT   PREOPERATIVE DIAGNOSES: 1. Left Achilles insertional spur. 2. Left heel pump bump.  POSTOPERATIVE DIAGNOSES: 1. Left Achilles insertional spur. 2. Left heel pump bump.  PROCEDURES: 1. Left heel spur excisions. 2. Left heel Achilles tendon repair.  ANESTHESIA:  General.  ATTENDING SURGEON:  Lubertha Basque. Jerl Santos, M.D.  ASSISTANT:  Elodia Florence, P.A.  INDICATION FOR PROCEDURE:  The patient is a 46 year old woman with many years of disabling and bilateral heel pain.  She has failed multiple shoe inserts and therapies.  She has large heel spurs and pump-bumps on x-ray.  At this point, she really can't wear any shoes.  She is about 4 months out from a successful right-sided procedure, and to this point, is offered the same procedure on the left, where we plan to remove some heel spurs and repair her Achilles tendon.  Informed operative consent was obtained after discussion of possible complications including reaction to anesthesia, infection, wound healing problems, and Achilles rupture.  SUMMARY OF FINDINGS AND PROCEDURE:  Under general anesthesia through a posteromedial longitudinal incision, we removed a large Achilles insertional spur and a pump-bump through a midline split in the tendon. This necessitated attachment of about 50% of the Achilles.  This was then repaired with Biomet anchors in a suture bridge fashion.  Used fluoroscopy throughout the case to make appropriate intraoperative decisions and read all of these views myself.  Kandace Parkins assisted throughout and was invaluable to the completion of the case, mostly in that he passed instruments and made this  much quicker operation.  He also closed simultaneously to help minimize the OR time.  DESCRIPTION OF PROCEDURE:  The patient was taken to the operating suite where general anesthetic was applied without difficulty.  She was positioned supine.  With chest rolls and all bony prominences appropriately padded.  Due to her size, we employed an Geophysicist/field seismologist to help hold her on the bed.  After an appropriate time-out and administration of preop IV vancomycin, the left leg was elevated, exsanguinated, and inflated about the calf.  We obviously did prep and drape first.  I made a midline incision as described above and dissected down to the Achilles tendon.  I split this midline to expose the large insertional spur. This was confirmed by fluoroscopy to be the spur in question.  This was removed with a saw followed by removal of the pump-bump through the midline split in the tendon.  I then irrigated.  Again, fluoroscopy was used to confirm adequate resection of the spurs.  I then placed 2 JuggerKnot anchors with 4 limbs emanating from each anchor.  Six limbs were passed through the Achilles tendon and tied down in simple fashion reapproximating the Achilles to its original insertion site.  I then placed 2 Quattro 4 anchors for the distal row utilized in 1 limb from each of the 3 aforementioned simple sutures.  This gave [place]  a nice tight repair.  I then took remaining 2 limbs from 1 of the JuggerKnots and passed these up through the tendon for a longitudinal repair in [place] fashion.  The wound was then thoroughly irrigated.  Subcutaneous tissues were reapproximated with 0 Vicryl and 2-0 undyed Vicryl, and skin was closed with subcuticular stitch and Steri-Strips.  We injected some Marcaine about the incision site followed by dry gauze and a posterior splint of plaster with ankle in neutral position.  The tourniquet was deflated during closure, and skin edges were became pink and  warm immediately.  DISPOSITION:  The patient was extubated in the operating room and taken to recovery room in stable condition.  PLAN:  The plan is for her to potentially go home same day.  I do not know how she feels in the recovery room.  She might potentially stay overnight if there are any concerns.     Lubertha BasquePeter G. Jerl Santosalldorf, M.D.     PGD/MEDQ  D:  01/27/2016  T:  01/28/2016  Job:  [id number]

## 2016-01-30 IMAGING — CR DG CHEST 2V
2 series · 2 of 2 positions shown · non-contrast
Comparison: Prior chest x-ray 05/17/2014

CLINICAL DATA: 44-year-old female with chest pain and shortness of
breath

EXAM:
CHEST  2 VIEW

[w chest pa]
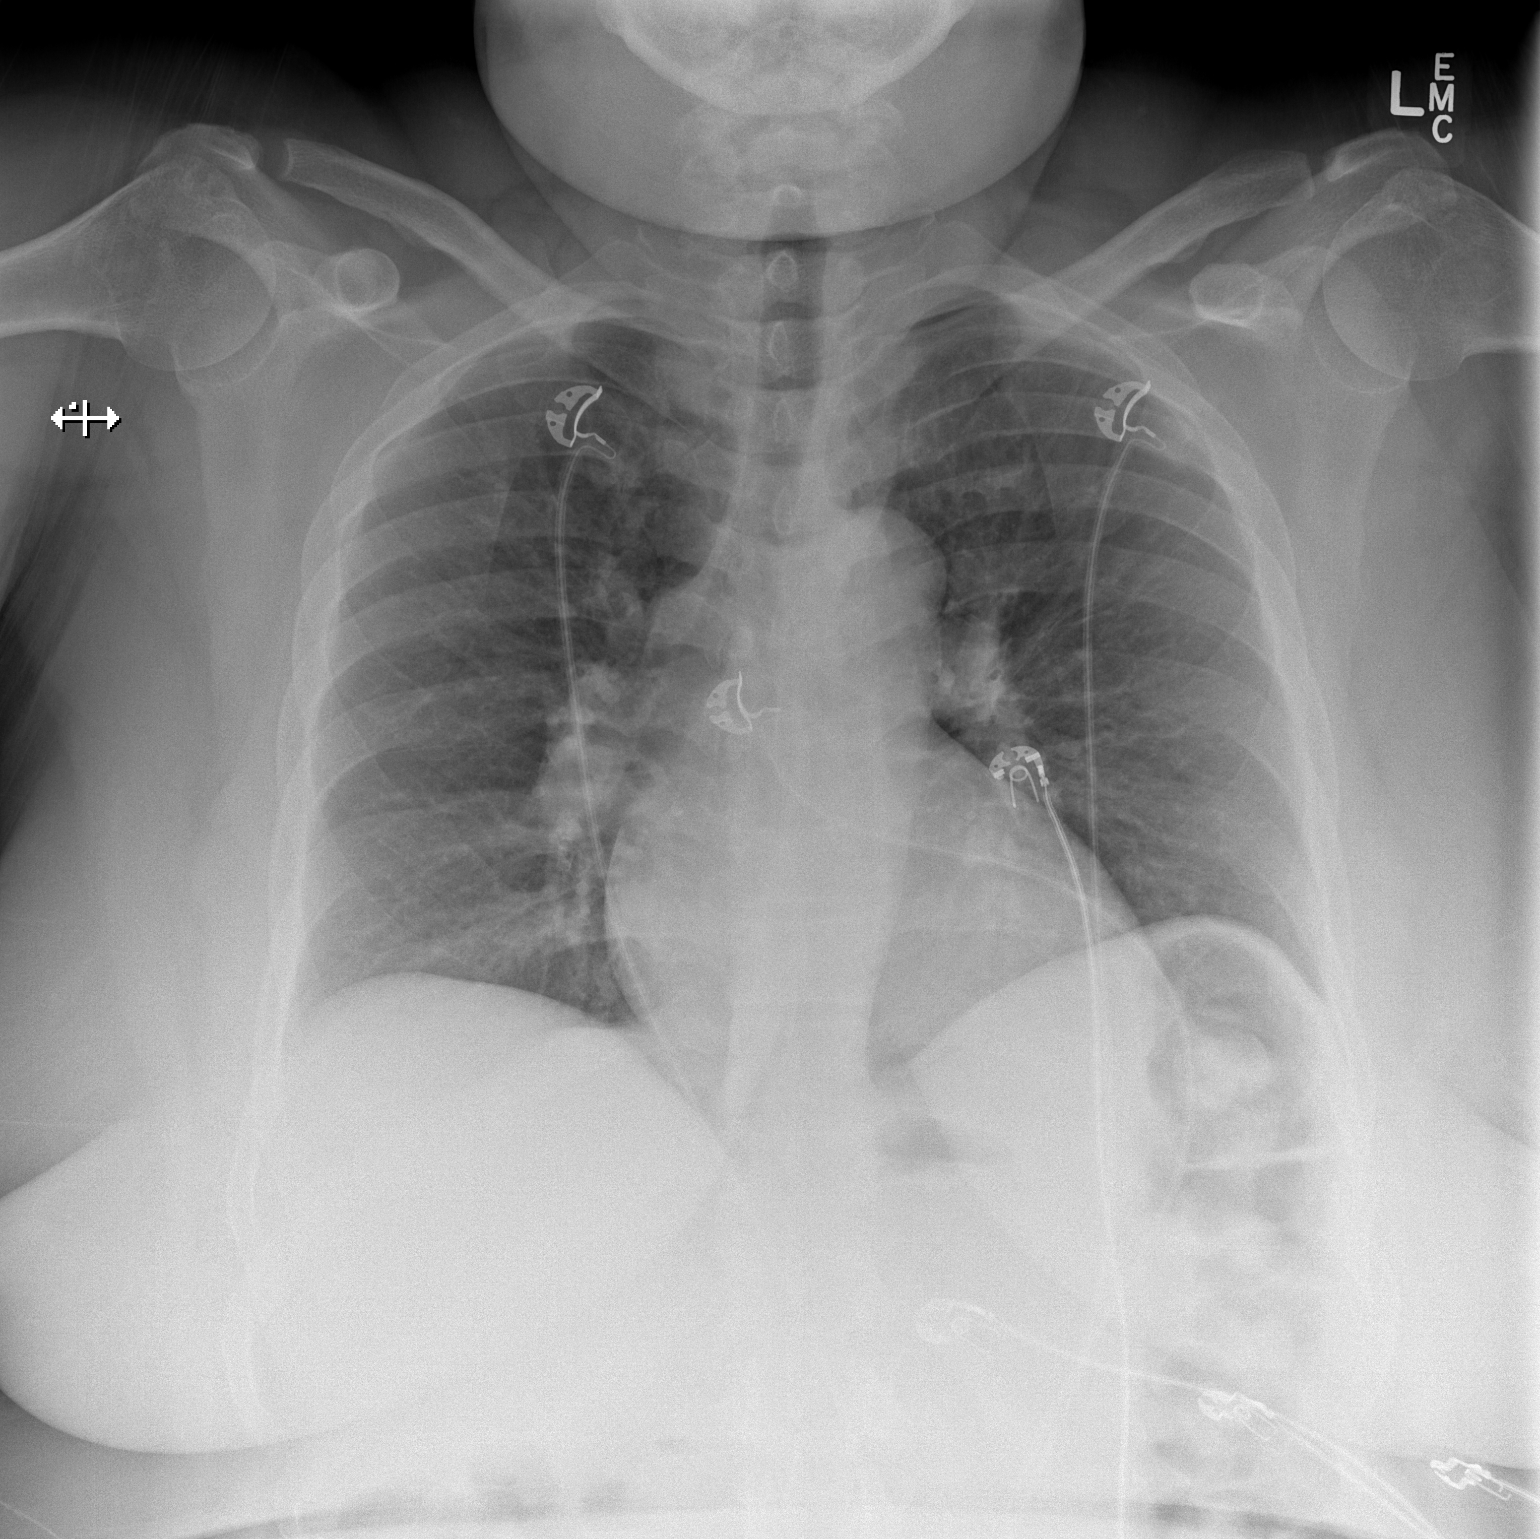

[w chest lat]
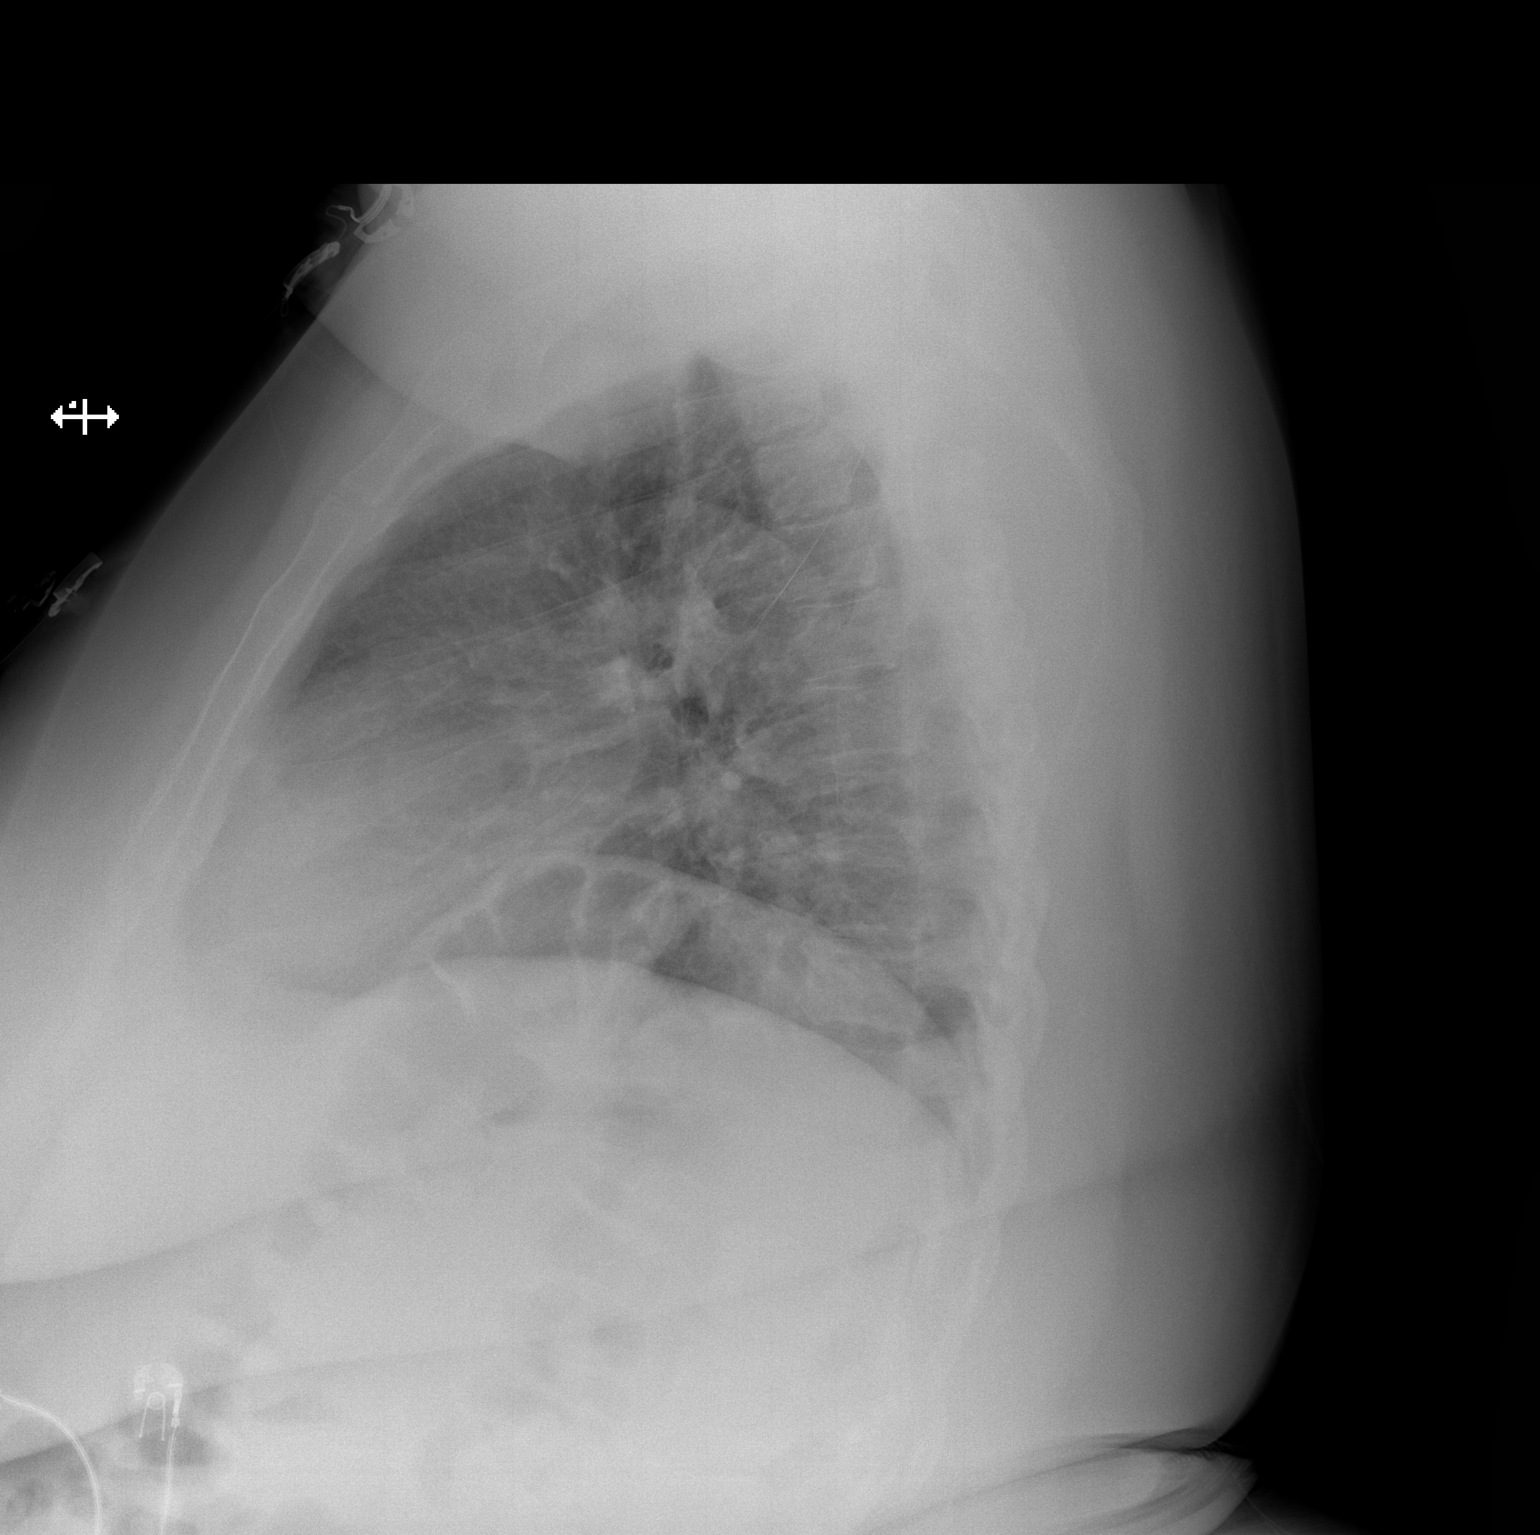

[2 of 2 positions shown; findings below may reference images not displayed]

FINDINGS: The lungs are clear and negative for focal airspace consolidation,
pulmonary edema or suspicious pulmonary nodule. Stable central
bronchitic change. No pleural effusion or pneumothorax. Cardiac and
mediastinal contours are within normal limits. No acute fracture or
lytic or blastic osseous lesions. The visualized upper abdominal
bowel gas pattern is unremarkable.
IMPRESSION: No active cardiopulmonary disease.

## 2016-02-05 ENCOUNTER — Ambulatory Visit: Payer: Medicaid Other

## 2016-02-09 ENCOUNTER — Emergency Department (HOSPITAL_COMMUNITY)
Admission: EM | Admit: 2016-02-09 | Discharge: 2016-02-09 | Discharge status: Against Medical Advice | Payer: Medicaid Other

## 2016-02-09 NOTE — ED Triage Notes (Signed)
Upon pt coming to triage room she states she would like to leave and come back tomorrow. Pt is stable, resp e/u. Vital signs within normal limits. Encouraged pt to stay but she felt she needed to leave and tend to her family. Pt states she will return in the morning.

## 2016-02-17 ENCOUNTER — Encounter: Payer: Self-pay | Admitting: *Deleted

## 2016-02-17 ENCOUNTER — Ambulatory Visit: Payer: Medicaid Other | Admitting: Cardiovascular Disease

## 2016-02-26 ENCOUNTER — Telehealth (HOSPITAL_COMMUNITY): Payer: Self-pay | Admitting: *Deleted

## 2016-03-02 ENCOUNTER — Other Ambulatory Visit: Payer: Self-pay | Admitting: Emergency Medicine

## 2016-03-03 ENCOUNTER — Other Ambulatory Visit: Payer: Self-pay | Admitting: Surgical Oncology

## 2016-03-03 ENCOUNTER — Telehealth: Payer: Self-pay | Admitting: Internal Medicine

## 2016-03-03 DIAGNOSIS — Z794 Long term (current) use of insulin: Secondary | ICD-10-CM

## 2016-03-03 DIAGNOSIS — Z9989 Dependence on other enabling machines and devices: Secondary | ICD-10-CM

## 2016-03-03 DIAGNOSIS — G4733 Obstructive sleep apnea (adult) (pediatric): Secondary | ICD-10-CM

## 2016-03-03 DIAGNOSIS — I10 Essential (primary) hypertension: Secondary | ICD-10-CM

## 2016-03-03 DIAGNOSIS — E119 Type 2 diabetes mellitus without complications: Secondary | ICD-10-CM

## 2016-03-03 DIAGNOSIS — J4540 Moderate persistent asthma, uncomplicated: Secondary | ICD-10-CM

## 2016-03-03 NOTE — Telephone Encounter (Signed)
MR this pts plan does not [facility] the [facility].  Would you like to change to another medication, or try and initiate the PA.  thanks

## 2016-03-04 ENCOUNTER — Telehealth (HOSPITAL_COMMUNITY): Payer: Self-pay | Admitting: *Deleted

## 2016-03-04 NOTE — Telephone Encounter (Signed)
Attempted to contact pt. No answer, no option to leave a message. Will try back.  

## 2016-03-04 NOTE — Telephone Encounter (Signed)
Try symbicort or dulera; lower dose  Dr. Kalman ShanMurali Yizel Canby, M.D., [facility]P Pulmonary and Critical Care Medicine Staff Physician [facility] Pager: [phone], If no answer or between  15:00h - 7:00h: call [phone]  03/04/2016 10:13 AM

## 2016-03-09 ENCOUNTER — Emergency Department (HOSPITAL_COMMUNITY)
Admission: EM | Admit: 2016-03-09 | Discharge: 2016-03-09 | Disposition: A | Discharge status: Home / Self Care | Payer: Medicaid Other | Attending: Emergency Medicine | Admitting: Emergency Medicine

## 2016-03-09 ENCOUNTER — Encounter (HOSPITAL_COMMUNITY): Payer: Self-pay | Admitting: *Deleted

## 2016-03-09 DIAGNOSIS — E119 Type 2 diabetes mellitus without complications: Secondary | ICD-10-CM | POA: Diagnosis not present

## 2016-03-09 DIAGNOSIS — Z79899 Other long term (current) drug therapy: Secondary | ICD-10-CM | POA: Diagnosis not present

## 2016-03-09 DIAGNOSIS — H00011 Hordeolum externum right upper eyelid: Secondary | ICD-10-CM | POA: Diagnosis not present

## 2016-03-09 DIAGNOSIS — I10 Essential (primary) hypertension: Secondary | ICD-10-CM | POA: Insufficient documentation

## 2016-03-09 DIAGNOSIS — J45909 Unspecified asthma, uncomplicated: Secondary | ICD-10-CM | POA: Insufficient documentation

## 2016-03-09 DIAGNOSIS — H5711 Ocular pain, right eye: Secondary | ICD-10-CM | POA: Diagnosis present

## 2016-03-09 MED ORDER — ERYTHROMYCIN 5 MG/GM OP OINT
1.0000 | TOPICAL_OINTMENT | Freq: Once | OPHTHALMIC | Status: AC
Start: 2016-03-09 — End: 2016-03-09
  Administered 2016-03-09: 1 via OPHTHALMIC
  Filled 2016-03-09: qty 3.5

## 2016-03-09 MED ORDER — TETRACAINE HCL 0.5 % OP SOLN
1.0000 [drp] | Freq: Once | OPHTHALMIC | Status: AC
  Administered 2016-03-09: 1 [drp] via OPHTHALMIC
  Filled 2016-03-09: qty 2

## 2016-03-09 MED ORDER — FLUORESCEIN SODIUM 0.6 MG OP STRP
1.0000 | ORAL_STRIP | Freq: Once | OPHTHALMIC | Status: AC
  Administered 2016-03-09: 1 via OPHTHALMIC
  Filled 2016-03-09: qty 1

## 2016-03-09 NOTE — ED Provider Notes (Signed)
[facility] Provider Note   CSN: [id number] Arrival date & time: 03/09/16  1642  By signing my name below, I, Talbert Nan, attest that this documentation has been prepared under the direction and in the presence of Kerrie Buffalo, NP. Electronically Signed: Talbert Nan, Scribe. 03/09/16. 7:05 PM.   History   Chief Complaint Chief Complaint  Patient presents with  . Eye Pain    HPI Charlesetta Milliron Diaz-Ramirez is a 46 y.o. female who presents to the Emergency Department complaining of gradual onset, moderate, persistent sore-sharp eye pain that started 4 days ago. Pt describes a knot on  her upper eyelid. Pt has associated eye drainage. Pt does not have an eye doctor. She doesn't wear glasses or lenses. Pt reports no injury to eye. Pt suspects that her dog may kick her at night when he sleepy above her head.  The history is provided by the patient. No language interpreter was used.    Past Medical History:  Diagnosis Date  . Ankle fracture, right 2001  . Anxiety   . Arthritis   . Asthma   . Carpal tunnel syndrome    Bilateral  . Diabetes mellitus without complication (HCC)    Type II  . GERD (gastroesophageal reflux disease)   . Hypertension   . Migraine   . OSA (obstructive sleep apnea)   . Plantar fasciitis of right foot   . Pneumonia    hx  . Shingles 11/2015  . Shortness of breath dyspnea    with walking short distances    Patient Active Problem List   Diagnosis Date Noted  . Moderate persistent asthma 12/09/2015  . Acute respiratory failure with hypoxia (HCC) 11/08/2015  . Medication management 10/23/2015  . OSA on CPAP 10/23/2015  . Pain of right heel 08/01/2015  . Chronic pain   . SOB (shortness of breath)   . Respiratory distress 03/24/2014  . Anemia 03/24/2014  . Diabetes mellitus type 2, controlled, without complications (HCC) 03/24/2014  . Asthma exacerbation 03/24/2014  . Acute respiratory failure (HCC) 03/24/2014  . Acute respiratory distress 03/24/2014   . Asthma 02/28/2014  . Hypertension 02/28/2014  . Morbid obesity (HCC) 02/28/2014  . DOE (dyspnea on exertion) 02/28/2014  . Cough 02/14/2014  . Dyspnea and respiratory abnormality 02/14/2014    Past Surgical History:  Procedure Laterality Date  . CARPAL TUNNEL RELEASE Left   . HEEL SPUR RESECTION Right 08/01/2015   Procedure: HEEL SPUR EXCISIONS;  Surgeon: Marcene Corning, MD;  Location: [facility] OR;  Service: Orthopedics;  Laterality: Right;  . HEEL SPUR RESECTION Left 01/27/2016   Procedure: LEFT HEEL SPUR EXCISION AND TENDON ACHILLIES REPAIR;  Surgeon: Marcene Corning, MD;  Location: [facility] OR;  Service: Orthopedics;  Laterality: Left;  PRONE POSITION  . TUBAL LIGATION      OB History    No data available       Home Medications    Prior to Admission medications   Medication Sig Start Date End Date Taking? Authorizing Provider  acetaminophen (TYLENOL) 500 MG tablet Take 2 tablets (1,000 mg total) by mouth every 6 (six) hours as needed. Patient taking differently: Take 1,000 mg by mouth every 6 (six) hours as needed for moderate pain or headache.  05/29/15   Ace Gins Sam, PA-C  albuterol (PROVENTIL HFA;VENTOLIN HFA) 108 (90 Base) MCG/ACT inhaler Inhale 2 puffs into the lungs every 6 (six) hours as needed for wheezing or shortness of breath. 09/09/15   Kalman Shan, MD  albuterol (PROVENTIL) (2.5  MG/3ML) 0.083% nebulizer solution Take 3 mLs (2.5 mg total) by nebulization 2 (two) times daily as needed for wheezing or shortness of breath. Patient taking differently: Take 2.5 mg by nebulization at bedtime.  09/03/15   Tammy S Parrett, NP  amLODipine (NORVASC) 10 MG tablet Take 1 tablet (10 mg total) by mouth daily. 07/29/15   Chrystie NoseKenneth C Hilty, MD  chlorthalidone (HYGROTON) 25 MG tablet Take 1 tablet (25 mg total) by mouth daily. 10/23/15 01/21/16  Chrystie NoseKenneth C Hilty, MD  clonazePAM (KLONOPIN) 1 MG tablet Take 1 mg by mouth 3 (three) times daily as needed for anxiety.    Historical Provider, MD    cyclobenzaprine (FLEXERIL) 10 MG tablet Take 1 tablet (10 mg total) by mouth 2 (two) times daily as needed for muscle spasms. 01/22/16   Madolyn FriezeVijay Nagpal, MD  fluticasone furoate-vilanterol (BREO ELLIPTA) 100-25 MCG/INH AEPB Inhale 1 puff into the lungs daily. Patient not taking: Reported on 01/21/2016 09/03/15   Tammy S Parrett, NP  fluticasone furoate-vilanterol (BREO ELLIPTA) 200-25 MCG/INH AEPB Inhale 1 puff into the lungs daily. 12/09/15   Kalman ShanMurali Ramaswamy, MD  HYDROmorphone (DILAUDID) 2 MG tablet Take 1-2 tablets (2-4 mg total) by mouth every 4 (four) hours as needed for severe pain. 01/27/16   Elodia FlorenceAndrew Nida, PA-C  liraglutide (VICTOZA) 18 MG/3ML SOPN Inject 1.8 mg into the skin daily.    Historical Provider, MD  TOUJEO SOLOSTAR 300 UNIT/ML SOPN INJ 45 UNITS [place] ONCE D 12/20/15   Historical Provider, MD  valsartan (DIOVAN) 320 MG tablet Take 1 tablet (320 mg total) by mouth daily. Patient taking differently: Take 320 mg by mouth at bedtime.  01/02/16   Lennette Biharihomas A Kelly, MD    Family History Family History  Problem Relation Age of Onset  . Hypertension Mother   . Breast cancer Maternal Aunt   . Brain cancer Cousin     Social History Social History  Substance Use Topics  . Smoking status: Never Smoker  . Smokeless tobacco: Never Used     Comment: years ago may have smoked 1 cig or less a week  . Alcohol use No     Allergies   Aspirin; Nsaids; Penicillins; Percocet [oxycodone-acetaminophen]; Vicodin [hydrocodone-acetaminophen]; and Tramadol   Review of Systems Review of Systems  Constitutional: Negative for fever.  Eyes: Positive for pain, discharge and itching.       Swelling of right upper lid.  Gastrointestinal: Negative for nausea.  Skin: Negative for wound.     Physical Exam Updated Vital Signs BP (!) 173/103   Pulse 91   Temp 98.7 F (37.1 C) (Oral)   Resp 18   SpO2 97%   Physical Exam  Constitutional: She is oriented to person, place, and time. She appears  well-developed and well-nourished. No distress.  HENT:  Head: Normocephalic and atraumatic.  Eyes: EOM are normal. Pupils are equal, round, and reactive to light. Right eye exhibits discharge and hordeolum. No foreign body present in the right eye. Right conjunctiva is injected.  Fundoscopic exam:      The right eye shows no hemorrhage.  Slit lamp exam:      The right eye shows no fluorescein uptake.  Stye. Purulent drainage. Swelling to upper lid.  Neck: Neck supple.  Cardiovascular: Normal rate.   Pulmonary/Chest: Effort normal.  Neurological: She is alert and oriented to person, place, and time.  Skin: Skin is warm and dry.  Psychiatric: She has a normal mood and affect.  Nursing note and vitals reviewed.  ED Treatments / Results   DIAGNOSTIC STUDIES: Oxygen Saturation is 99% on room air, normal by my interpretation.    COORDINATION OF CARE: 7:05 PM Discussed treatment plan with pt at bedside and pt agreed to plan.  Labs (all labs ordered are listed, but only abnormal results are displayed) Labs Reviewed - No data to display  Radiology No results found.  Procedures Procedures (including critical care time)  Medications Ordered in ED Medications  tetracaine (PONTOCAINE) 0.5 % ophthalmic solution 1 drop (1 drop Right Eye Given 03/09/16 1922)  fluorescein ophthalmic strip 1 strip (1 strip Right Eye Given 03/09/16 1922)  erythromycin ophthalmic ointment 1 application (1 application Right Eye Given 03/09/16 2004)     Initial Impression / Assessment and Plan / ED Course  I have reviewed the triage vital signs and the nursing notes.  Pertinent labs & imaging results that were available during my care of the patient were reviewed by me and considered in my medical decision making (see chart for details).  Final Clinical Impressions(s) / ED Diagnoses  46 y.o. female with swelling and tenderness to the right upper eyelid and drainage. Stable for d/c without visual change,  foreign body or corneal abrasion. Will treat for sty and referral given to opthalmology.  Erythromycin Opth Ointment to right eye tid.  Final diagnoses:  Hordeolum externum of right upper eyelid    New Prescriptions New Prescriptions   No medications on file  I personally performed the services described in this documentation, which was scribed in my presence. The recorded information has been reviewed and is accurate.     [address] [place], [place] 03/09/16 2017    Alvira Monday, MD 03/11/16 [phone]

## 2016-03-09 NOTE — Discharge Instructions (Signed)
Use the eye ointment 3 times a day for the next week. Follow up with Dr. Charlotte SanesMcCuen if symptoms persist.

## 2016-03-09 NOTE — ED Notes (Signed)
See EDP assessment 

## 2016-03-09 NOTE — ED Triage Notes (Signed)
Pt reports right upper eyelid is itching, swollen and irritated. No change in vision.

## 2016-03-11 ENCOUNTER — Encounter (HOSPITAL_COMMUNITY): Payer: Self-pay | Admitting: *Deleted

## 2016-03-11 NOTE — Progress Notes (Addendum)
I have made three attempts to contact Melanie Hess for the Outpt. Pulmonary Rehab Maintenance program. There was no way to leave a message twice. The third time I left a message and I have not received a return call. I will not try to reach her any further.

## 2016-03-15 ENCOUNTER — Other Ambulatory Visit: Payer: Medicaid Other

## 2016-03-15 ENCOUNTER — Ambulatory Visit
Admission: RE | Admit: 2016-03-15 | Discharge: 2016-03-15 | Disposition: A | Discharge status: Home / Self Care | Payer: Medicaid Other | Source: Ambulatory Visit | Attending: Surgical Oncology | Admitting: Surgical Oncology

## 2016-03-15 DIAGNOSIS — J4540 Moderate persistent asthma, uncomplicated: Secondary | ICD-10-CM

## 2016-03-15 DIAGNOSIS — E119 Type 2 diabetes mellitus without complications: Secondary | ICD-10-CM

## 2016-03-15 DIAGNOSIS — G4733 Obstructive sleep apnea (adult) (pediatric): Secondary | ICD-10-CM

## 2016-03-15 DIAGNOSIS — I10 Essential (primary) hypertension: Secondary | ICD-10-CM

## 2016-03-15 DIAGNOSIS — Z794 Long term (current) use of insulin: Secondary | ICD-10-CM

## 2016-03-15 DIAGNOSIS — Z9989 Dependence on other enabling machines and devices: Secondary | ICD-10-CM

## 2016-03-19 ENCOUNTER — Encounter: Payer: Self-pay | Admitting: Physician Assistant

## 2016-03-19 ENCOUNTER — Ambulatory Visit (INDEPENDENT_AMBULATORY_CARE_PROVIDER_SITE_OTHER): Payer: Medicaid Other | Admitting: Physician Assistant

## 2016-03-19 VITALS — BP 137/86 | HR 80 | Ht 66.0 in | Wt 396.4 lb

## 2016-03-19 DIAGNOSIS — I10 Essential (primary) hypertension: Secondary | ICD-10-CM

## 2016-03-19 DIAGNOSIS — Z9989 Dependence on other enabling machines and devices: Secondary | ICD-10-CM

## 2016-03-19 DIAGNOSIS — G4733 Obstructive sleep apnea (adult) (pediatric): Secondary | ICD-10-CM

## 2016-03-19 DIAGNOSIS — E118 Type 2 diabetes mellitus with unspecified complications: Secondary | ICD-10-CM | POA: Diagnosis not present

## 2016-03-19 DIAGNOSIS — Z794 Long term (current) use of insulin: Secondary | ICD-10-CM | POA: Diagnosis not present

## 2016-03-19 DIAGNOSIS — R0789 Other chest pain: Secondary | ICD-10-CM | POA: Diagnosis not present

## 2016-03-19 MED ORDER — AMLODIPINE BESYLATE 5 MG PO TABS: 5.0000 mg | ORAL_TABLET | Freq: Every day | ORAL | 3 refills | Status: DC

## 2016-03-19 NOTE — Progress Notes (Addendum)
Cardiology Office Note    Date:  03/19/2016   ID:  Melanie Hess, DOB November 03, 1970, MRN [id number]  PCP:  Melanie Chimera, MD  Cardiologist:  Dr. [name]   Chief Complaint  Patient presents with  . Follow-up    seen for Dr. [name]    History of Present Illness:  Melanie Hess is a 46 y.o. female with PMH of asthma, HTN, DM II, OSA and morbid obesity. She has started a weight management program at [facility]. She had a PFT in February 2016 that showed Her last echo on 03/26/2014 showed EF 55-60%, mild LVH, no regional wall motion abnormality. She was last seen in October 2017, she was diagnosed with moderate to severe obstructive sleep apnea and prescribe CPAP. She is also pursuing bariatric surgery as well. Her blood pressure was still not controlled at the time, she was started on chlorthalidone 25 mg daily. He has a history of cough with lisinopril, she was supposed to be on ARB, however when she followed by the hypertension clinic in November 2017, it turned out her medication which did not include the olmesartan that she is supposed to be on. She also had multiple ED visits for chest wall pain and shingles. Her blood pressure was uncontrolled on amlodipine and chlorthalidone, she was started on valsartan 160 mg daily. Since last visit, she has been to the hospital in January for left heel spur removal and Achilles tendon repair  Since last year, patient has been doing well. It appears her valsartan was increased to 320 mg daily after she was seen by Dr. Tresa Endo in the sleep study clinic. She has since discontinue her amlodipine. She is unable to tell me who discontinued her amlodipine however amlodipine still listed as active medication under her medication list. She has not been taking it. Her blood pressure is mildly elevated today, but very close to her goal. I have recommended for her to restart amlodipine 5 mg instead. She has chronic chest pain, the degree and the  characteristic seems to be atypical and only occurs when she is emotionally stressed. She says her chest pain usually occur when she has to yell at her grandkids. This chest pain is chronic for her and the degree has not changed in the recent years. I do not think we need to obtain any ischemic workup for this. Otherwise I think she is doing quite well follow-up in 6 months.      Past Medical History:  Diagnosis Date  . Ankle fracture, right 2001  . Anxiety   . Arthritis   . Asthma   . Carpal tunnel syndrome    Bilateral  . Diabetes mellitus without complication (HCC)    Type II  . GERD (gastroesophageal reflux disease)   . Hypertension   . Migraine   . OSA (obstructive sleep apnea)   . Plantar fasciitis of right foot   . Pneumonia    hx  . Shingles 11/2015  . Shortness of breath dyspnea    with walking short distances    Past Surgical History:  Procedure Laterality Date  . CARPAL TUNNEL RELEASE Left   . HEEL SPUR RESECTION Right 08/01/2015   Procedure: HEEL SPUR EXCISIONS;  Surgeon: Melanie Corning, MD;  Location: [facility] OR;  Service: Orthopedics;  Laterality: Right;  . HEEL SPUR RESECTION Left 01/27/2016   Procedure: LEFT HEEL SPUR EXCISION AND TENDON ACHILLIES REPAIR;  Surgeon: Melanie Corning, MD;  Location: [facility] OR;  Service: Orthopedics;  Laterality: Left;  PRONE POSITION  . TUBAL LIGATION      Current Medications: Outpatient Medications Prior to Visit  Medication Sig Dispense Refill  . acetaminophen (TYLENOL) 500 MG tablet Take 2 tablets (1,000 mg total) by mouth every 6 (six) hours as needed. (Patient taking differently: Take 1,000 mg by mouth every 6 (six) hours as needed for moderate pain or headache. ) 30 tablet 0  . albuterol (PROVENTIL HFA;VENTOLIN HFA) 108 (90 Base) MCG/ACT inhaler Inhale 2 puffs into the lungs every 6 (six) hours as needed for wheezing or shortness of breath. 1 Inhaler 6  . clonazePAM (KLONOPIN) 1 MG tablet Take 1 mg by mouth 3 (three) times daily as  needed for anxiety.    . cyclobenzaprine (FLEXERIL) 10 MG tablet Take 1 tablet (10 mg total) by mouth 2 (two) times daily as needed for muscle spasms. 20 tablet 0  . HYDROmorphone (DILAUDID) 2 MG tablet Take 1-2 tablets (2-4 mg total) by mouth every 4 (four) hours as needed for severe pain. 30 tablet 0  . liraglutide (VICTOZA) 18 MG/3ML SOPN Inject 1.8 mg into the skin daily.    Nathen May SOLOSTAR 300 UNIT/ML SOPN INJ 45 UNITS [place] ONCE D  3  . albuterol (PROVENTIL) (2.5 MG/3ML) 0.083% nebulizer solution Take 3 mLs (2.5 mg total) by nebulization 2 (two) times daily as needed for wheezing or shortness of breath. (Patient taking differently: Take 2.5 mg by nebulization at bedtime. ) 75 mL 5  . amLODipine (NORVASC) 10 MG tablet Take 1 tablet (10 mg total) by mouth daily. 180 tablet 3  . valsartan (DIOVAN) 320 MG tablet Take 1 tablet (320 mg total) by mouth daily. (Patient taking differently: Take 320 mg by mouth at bedtime. ) 30 tablet 6  . chlorthalidone (HYGROTON) 25 MG tablet Take 1 tablet (25 mg total) by mouth daily. 90 tablet 3  . fluticasone furoate-vilanterol (BREO ELLIPTA) 100-25 MCG/INH AEPB Inhale 1 puff into the lungs daily. (Patient not taking: Reported on 01/21/2016) 1 each 5  . fluticasone furoate-vilanterol (BREO ELLIPTA) 200-25 MCG/INH AEPB Inhale 1 puff into the lungs daily. 28 each 6   No facility-administered medications prior to visit.      Allergies:   Aspirin; Nsaids; Penicillins; Tolmetin; Oxycodone-acetaminophen; Percocet [oxycodone-acetaminophen]; Vicodin [hydrocodone-acetaminophen]; and Tramadol   Social History   Social History  . Marital status: Married    Spouse name: N/A  . Number of children: N/A  . Years of education: N/A   Occupational History  . daycare teacher    Social History Main Topics  . Smoking status: Never Smoker  . Smokeless tobacco: Never Used     Comment: years ago may have smoked 1 cig or less a week  . Alcohol use No  . Drug use: No  . Sexual  activity: Not Asked   Other Topics Concern  . None   Social History Narrative  . None     Family History:  The patient's family history includes Brain cancer in her cousin; Breast cancer in her maternal aunt; Hypertension in her mother.   ROS:   Please see the history of present illness.    ROS All other systems reviewed and are negative.   PHYSICAL EXAM:   VS:  BP 137/86   Pulse 80   Ht 5\' 6"  (1.676 m)   Wt (!) 396 lb 6.4 oz (179.8 kg)   BMI 63.98 kg/m    GEN: Well nourished, well developed, in no acute distress  HEENT: normal  Neck: no  JVD, carotid bruits, or masses Cardiac: RRR; no murmurs, rubs, or gallops,no edema  Respiratory:  clear to auscultation bilaterally, normal work of breathing GI: soft, nontender, nondistended, + BS MS: no deformity or atrophy  Skin: warm and dry, no rash Neuro:  Alert and Oriented x 3, Strength and sensation are intact Psych: euthymic mood, full affect  Wt Readings from Last 3 Encounters:  03/19/16 (!) 396 lb 6.4 oz (179.8 kg)  01/22/16 (!) 400 lb (181.4 kg)  01/21/16 (!) 403 lb 1.6 oz (182.8 kg)      Studies/Labs Reviewed:   EKG:  EKG is ordered today.  The ekg ordered today demonstrates Normal sinus rhythm with no significant ST-T wave changes  Recent Labs: 11/12/2015: B Natriuretic Peptide 17.2 01/22/2016: BUN 9; Creatinine, Ser 1.02; Hemoglobin 12.0; Platelets 276; Potassium 4.3; Sodium 138   Lipid Panel No results found for: CHOL, TRIG, HDL, CHOLHDL, VLDL, LDLCALC, LDLDIRECT  Additional studies/ records that were reviewed today include:   Echo 03/26/2014 LV EF: 55% -  60%  - Left ventricle: There is a false tendon at the LV apex. The cavity size was normal. There was mild concentric hypertrophy. Systolic function was normal. The estimated ejection fraction was in the range of 55% to 60%. Wall motion was normal; there were no regional wall motion abnormalities. - Mitral valve: There was trivial  regurgitation.   PFT 02/26/2014 Interpretation: The FVC, FEV1, FEV1/FVC ratio and FEF25-75% are reduced indicating airway obstruction. Following administration of bronchodilators, there is no significant response. Conclusions: Methacholine Inhalation Challenge Pulmonary Function Diagnosis: Moderate Obstructive Airways Disease -Peripheral Airway Positive for hyper-reactive airways at 106.56 CDU's  ASSESSMENT:    1. Other chest pain   2. Essential hypertension   3. Controlled type 2 diabetes mellitus with complication, with long-term current use of insulin (HCC)   4. OSA on CPAP   5. Morbid obesity (HCC)      PLAN:  In order of problems listed above:  1. Atypical chest pain: Has chronic chest pain associated with emotional stress. It is worse with deep inspiration. It does not seems to be associated with exertion. I do not think we need any ischemic workup. A stress test would not be very good in this case due to her morbid obesity which can potentially cause undue artifact and the high likelihood of false positive.   2. Hypertension: Blood pressure mildly elevated today, however also turned out patient has not been taking her amlodipine. I will restart her amlodipine 5 mg which should bring her back down to goal. She is also on 320 mg valsartan and 25 mg chlorthalidone. She does not appears to have significant amount of lower extremity edema on physical exam.  3. Insulin dependent diabetes mellitus: Continue on insulin, managed by PCP  4. Morbid obesity: This is her primary issue, I think was should lose weight, her chest discomfort and high blood pressure will improve. She will also have higher quality of life. She has upcoming gastric bypass surgery.   Medication Adjustments/Labs and Tests Ordered: Current medicines are reviewed at length with the patient today.  Concerns regarding medicines are outlined above.  Medication changes, Labs and Tests ordered today are listed in the  Patient Instructions below. Patient Instructions  Medication Instructions:  RESTART amlodipine 5mg  once daily  Labwork: NONE  Testing/Procedures: NONE  Follow-Up: Your physician recommends that you schedule a follow-up appointment in: 3-4 Months with Dr. [name].   Any Other Special Instructions Will Be Listed Below (If Applicable).  If you need a refill on your cardiac medications before your next appointment, please call your pharmacy.      Ramond DialSigned, Kynsley Whitehouse, [place]  03/19/2016 9:23 PM    [facility] [address], [place], [place]  [address] Phone: [phone]) [phone]; Fax: [phone]

## 2016-03-19 NOTE — Patient Instructions (Signed)
Medication Instructions:  RESTART amlodipine 5mg  once daily  Labwork: NONE  Testing/Procedures: NONE  Follow-Up: Your physician recommends that you schedule a follow-up appointment in: 3-4 Months with Dr. Rennis GoldenHilty.   Any Other Special Instructions Will Be Listed Below (If Applicable).     If you need a refill on your cardiac medications before your next appointment, please call your pharmacy.

## 2016-03-29 ENCOUNTER — Other Ambulatory Visit: Payer: Self-pay | Admitting: Internal Medicine

## 2016-04-07 ENCOUNTER — Encounter (HOSPITAL_COMMUNITY): Payer: Self-pay | Admitting: Emergency Medicine

## 2016-04-07 ENCOUNTER — Observation Stay (HOSPITAL_COMMUNITY)
Admission: EM | Admit: 2016-04-07 | Discharge: 2016-04-08 | Disposition: A | Discharge status: Home / Self Care | Payer: Medicaid Other | Attending: Internal Medicine | Admitting: Internal Medicine

## 2016-04-07 ENCOUNTER — Other Ambulatory Visit: Payer: Self-pay

## 2016-04-07 ENCOUNTER — Observation Stay (HOSPITAL_COMMUNITY): Payer: Medicaid Other

## 2016-04-07 ENCOUNTER — Emergency Department (HOSPITAL_COMMUNITY): Payer: Medicaid Other

## 2016-04-07 DIAGNOSIS — Z79899 Other long term (current) drug therapy: Secondary | ICD-10-CM | POA: Insufficient documentation

## 2016-04-07 DIAGNOSIS — Z794 Long term (current) use of insulin: Secondary | ICD-10-CM | POA: Insufficient documentation

## 2016-04-07 DIAGNOSIS — Z9989 Dependence on other enabling machines and devices: Secondary | ICD-10-CM | POA: Diagnosis not present

## 2016-04-07 DIAGNOSIS — I119 Hypertensive heart disease without heart failure: Secondary | ICD-10-CM | POA: Diagnosis not present

## 2016-04-07 DIAGNOSIS — I10 Essential (primary) hypertension: Secondary | ICD-10-CM | POA: Diagnosis present

## 2016-04-07 DIAGNOSIS — R0609 Other forms of dyspnea: Secondary | ICD-10-CM | POA: Diagnosis not present

## 2016-04-07 DIAGNOSIS — K760 Fatty (change of) liver, not elsewhere classified: Secondary | ICD-10-CM | POA: Diagnosis present

## 2016-04-07 DIAGNOSIS — R0789 Other chest pain: Secondary | ICD-10-CM | POA: Diagnosis not present

## 2016-04-07 DIAGNOSIS — E118 Type 2 diabetes mellitus with unspecified complications: Secondary | ICD-10-CM | POA: Diagnosis not present

## 2016-04-07 DIAGNOSIS — G4733 Obstructive sleep apnea (adult) (pediatric): Secondary | ICD-10-CM | POA: Diagnosis not present

## 2016-04-07 DIAGNOSIS — Z6841 Body Mass Index (BMI) 40.0 and over, adult: Secondary | ICD-10-CM | POA: Insufficient documentation

## 2016-04-07 DIAGNOSIS — J4540 Moderate persistent asthma, uncomplicated: Secondary | ICD-10-CM | POA: Diagnosis not present

## 2016-04-07 DIAGNOSIS — Z7951 Long term (current) use of inhaled steroids: Secondary | ICD-10-CM | POA: Diagnosis not present

## 2016-04-07 DIAGNOSIS — I517 Cardiomegaly: Secondary | ICD-10-CM | POA: Diagnosis present

## 2016-04-07 DIAGNOSIS — F419 Anxiety disorder, unspecified: Secondary | ICD-10-CM | POA: Insufficient documentation

## 2016-04-07 DIAGNOSIS — R079 Chest pain, unspecified: Secondary | ICD-10-CM | POA: Diagnosis present

## 2016-04-07 DIAGNOSIS — E119 Type 2 diabetes mellitus without complications: Secondary | ICD-10-CM | POA: Insufficient documentation

## 2016-04-07 DIAGNOSIS — R0602 Shortness of breath: Secondary | ICD-10-CM | POA: Diagnosis present

## 2016-04-07 LAB — CBC
HCT: 36.2 % (ref 36.0–46.0)
Hemoglobin: 11.7 g/dL — ABNORMAL LOW (ref 12.0–15.0)
MCH: 27.8 pg (ref 26.0–34.0)
MCHC: 32.3 g/dL (ref 30.0–36.0)
MCV: 86 fL (ref 78.0–100.0)
Platelets: 270 K/uL (ref 150–400)
RBC: 4.21 MIL/uL (ref 3.87–5.11)
RDW: 16.2 % — ABNORMAL HIGH (ref 11.5–15.5)
WBC: 7.1 K/uL (ref 4.0–10.5)

## 2016-04-07 LAB — BASIC METABOLIC PANEL
Anion gap: 10 (ref 5–15)
BUN: 12 mg/dL (ref 6–20)
CHLORIDE: 106 mmol/L (ref 101–111)
CO2: 22 mmol/L (ref 22–32)
CREATININE: 1.02 mg/dL — AB (ref 0.44–1.00)
Calcium: 8.7 mg/dL — ABNORMAL LOW (ref 8.9–10.3)
GFR calc Af Amer: >60 mL/min (ref >60)
GFR calc non Af Amer: >60 mL/min (ref >60)
Glucose, Bld: 99 mg/dL (ref 65–99)
Potassium: 3.9 mmol/L (ref 3.5–5.1)
Sodium: 138 mmol/L (ref 135–145)

## 2016-04-07 LAB — I-STAT TROPONIN, ED: Troponin i, poc: 0.01 ng/mL (ref 0.00–0.08)

## 2016-04-07 LAB — SEDIMENTATION RATE: Sed Rate: 31 mm/hr — ABNORMAL HIGH (ref 0–22)

## 2016-04-07 LAB — TROPONIN I

## 2016-04-07 LAB — BRAIN NATRIURETIC PEPTIDE: B Natriuretic Peptide: 15.4 pg/mL (ref 0.0–100.0)

## 2016-04-07 LAB — GLUCOSE, CAPILLARY
GLUCOSE-CAPILLARY: 99 mg/dL (ref 65–99)
Glucose-Capillary: 111 mg/dL — ABNORMAL HIGH (ref 65–99)

## 2016-04-07 LAB — CBG MONITORING, ED: GLUCOSE-CAPILLARY: 80 mg/dL (ref 65–99)

## 2016-04-07 MED ORDER — INSULIN GLARGINE 100 UNIT/ML ~~LOC~~ SOLN
45.0000 [IU] | Freq: Every day | SUBCUTANEOUS | Status: DC
  Administered 2016-04-08: 45 [IU] via SUBCUTANEOUS
  Filled 2016-04-07: qty 0.45

## 2016-04-07 MED ORDER — INSULIN ASPART 100 UNIT/ML ~~LOC~~ SOLN: 0.0000 [IU] | Freq: Every day | SUBCUTANEOUS | Status: DC

## 2016-04-07 MED ORDER — ASPIRIN EC 325 MG PO TBEC: 325.0000 mg | DELAYED_RELEASE_TABLET | Freq: Every day | ORAL | Status: DC

## 2016-04-07 MED ORDER — ALBUTEROL SULFATE (2.5 MG/3ML) 0.083% IN NEBU
2.5000 mg | INHALATION_SOLUTION | Freq: Two times a day (BID) | RESPIRATORY_TRACT | Status: DC
  Administered 2016-04-07 – 2016-04-08 (×3): 2.5 mg via RESPIRATORY_TRACT
  Filled 2016-04-07 (×2): qty 3

## 2016-04-07 MED ORDER — LIRAGLUTIDE 18 MG/3ML ~~LOC~~ SOPN: 1.8000 mg | PEN_INJECTOR | Freq: Every day | SUBCUTANEOUS | Status: DC

## 2016-04-07 MED ORDER — CYCLOBENZAPRINE HCL 10 MG PO TABS: 10.0000 mg | ORAL_TABLET | Freq: Two times a day (BID) | ORAL | Status: DC | PRN

## 2016-04-07 MED ORDER — SODIUM CHLORIDE 0.9 % IV SOLN
INTRAVENOUS | Status: AC
  Administered 2016-04-07: 17:00:00 via INTRAVENOUS

## 2016-04-07 MED ORDER — CHLORTHALIDONE 25 MG PO TABS: 25.0000 mg | ORAL_TABLET | Freq: Every morning | ORAL | Status: DC

## 2016-04-07 MED ORDER — ONDANSETRON HCL 4 MG/2ML IJ SOLN: 4.0000 mg | Freq: Four times a day (QID) | INTRAMUSCULAR | Status: DC | PRN

## 2016-04-07 MED ORDER — IRBESARTAN 300 MG PO TABS
300.0000 mg | ORAL_TABLET | Freq: Every day | ORAL | Status: DC
  Administered 2016-04-07 – 2016-04-08 (×2): 300 mg via ORAL
  Filled 2016-04-07 (×2): qty 1

## 2016-04-07 MED ORDER — ALBUTEROL SULFATE (2.5 MG/3ML) 0.083% IN NEBU
2.5000 mg | INHALATION_SOLUTION | Freq: Four times a day (QID) | RESPIRATORY_TRACT | Status: DC | PRN
  Filled 2016-04-07: qty 3

## 2016-04-07 MED ORDER — IOPAMIDOL (ISOVUE-370) INJECTION 76%
INTRAVENOUS | Status: AC
  Administered 2016-04-07: 100 mL via INTRAVENOUS
  Filled 2016-04-07: qty 100

## 2016-04-07 MED ORDER — ACETAMINOPHEN 325 MG PO TABS: 650.0000 mg | ORAL_TABLET | ORAL | Status: DC | PRN

## 2016-04-07 MED ORDER — FLUTICASONE FUROATE-VILANTEROL 100-25 MCG/INH IN AEPB
1.0000 | INHALATION_SPRAY | Freq: Every day | RESPIRATORY_TRACT | Status: DC
  Filled 2016-04-07: qty 28

## 2016-04-07 MED ORDER — INSULIN ASPART 100 UNIT/ML ~~LOC~~ SOLN: 0.0000 [IU] | Freq: Three times a day (TID) | SUBCUTANEOUS | Status: DC

## 2016-04-07 MED ORDER — ENOXAPARIN SODIUM 40 MG/0.4ML ~~LOC~~ SOLN
40.0000 mg | SUBCUTANEOUS | Status: DC
  Administered 2016-04-07: 40 mg via SUBCUTANEOUS
  Filled 2016-04-07: qty 0.4

## 2016-04-07 MED ORDER — CLONAZEPAM 1 MG PO TABS: 1.0000 mg | ORAL_TABLET | Freq: Three times a day (TID) | ORAL | Status: DC | PRN

## 2016-04-07 MED ORDER — FLUTICASONE FUROATE-VILANTEROL 200-25 MCG/INH IN AEPB: 1.0000 | INHALATION_SPRAY | Freq: Every day | RESPIRATORY_TRACT | Status: DC

## 2016-04-07 MED ORDER — AMLODIPINE BESYLATE 5 MG PO TABS
5.0000 mg | ORAL_TABLET | Freq: Every day | ORAL | Status: DC
  Administered 2016-04-08: 5 mg via ORAL
  Filled 2016-04-07: qty 1

## 2016-04-07 MED ORDER — PREGABALIN 75 MG PO CAPS
75.0000 mg | ORAL_CAPSULE | Freq: Two times a day (BID) | ORAL | Status: DC
  Administered 2016-04-07 – 2016-04-08 (×3): 75 mg via ORAL
  Filled 2016-04-07 (×3): qty 1

## 2016-04-07 NOTE — ED Notes (Signed)
Pt states she feels less SHOB after treatment

## 2016-04-07 NOTE — ED Notes (Signed)
Patient transported to CT 

## 2016-04-07 NOTE — H&P (Signed)
History and Physical    Melanie Hess ZDG:[id number] DOB: [birth year] DOA: 04/07/2016   PCP: Kristine Garbe, MD   Patient coming from/Resides with: Private residence  Admission status: Observation/telemetry -it may be medically necessary to stay a minimum 2 midnights to rule out impending and/or unexpected changes in physiologic status that may differ from initial evaluation performed in the ER and/or at time of admission therefore consider reevaluation of admission status 24 hours.   Chief Complaint: Dyspnea on exertion, chest pain  HPI: Melanie Hess is a 46 y.o. female with medical history significant for diabetes mellitus on insulin, hypertension, asthma followed by rheumatology, chronic dyspnea on exertion, sleep apnea on CPAP and morbid obesity. Patient presents with worsening of her underlying dyspnea on exertion. While at work she has noticed pain in her mid back as well as some discomfort in her left leg scribed as sharp pains and tingling. She is no longer able to walk across the classroom at her job without becoming short of breath and having left-sided chest pain. She has noticed increased fatigue. She also has a pleuritic component to the chest pain with increased chest pain with taking a deep breath and with leaning forward. The shortness of breath will resolve with rest but the chest pain is persistent. She also has what sounds like orthopnea noting she is unable to sleep even with utilization of her CPAP secondary to shortness of breath. Of note patient had repair of her left Achilles tendon done January 2018. She reports an 8 pound weight gain over the past 2 weeks.  ED Course:  Vital Signs: BP 112/82   Pulse 74   Temp 98 F (36.7 C) (Oral)   Resp 15   Ht 5' 6" (1.676 m)   Wt (!) 183.6 kg (404 lb 12.2 oz)   SpO2 97%   BMI 65.33 kg/m  2 view CXR: No Acute process Lab data: Sodium 138, potassium 3.9, chloride 106, CO2 22, glucose 99, BUN 12, creatinine 1.2,  calcium 8.7, BNP 15.4, troponin 0.01, white count 7100 differential not obtained, hemoglobin 11.7, platelets 270,000 Medications and treatments: None  Review of Systems:  In addition to the HPI above,  No Fever-chills, myalgias or other constitutional symptoms No Headache, changes with Vision or hearing, new weakness,  numbness in any extremity, dizziness, dysarthria or word finding difficulty, gait disturbance or imbalance, tremors or seizure activity No problems swallowing food or Liquids, indigestion/reflux, choking or coughing while eating, abdominal pain with or after eating No Cough, palpitations No Abdominal pain, N/V, melena,hematochezia, dark tarry stools, constipation No dysuria, malodorous urine, hematuria or flank pain No new skin rashes, lesions, masses or bruises, No new joint pains, aches, swelling or redness No recent unintentional loss No polyuria, polydypsia or polyphagia   Past Medical History:  Diagnosis Date  . Ankle fracture, right 2001  . Anxiety   . Arthritis   . Asthma   . Carpal tunnel syndrome    Bilateral  . Diabetes mellitus without complication (HCC)    Type II  . GERD (gastroesophageal reflux disease)   . Hypertension   . Migraine   . OSA (obstructive sleep apnea)   . Plantar fasciitis of right foot   . Pneumonia    hx  . Shingles 11/2015  . Shortness of breath dyspnea    with walking short distances    Past Surgical History:  Procedure Laterality Date  . CARPAL TUNNEL RELEASE Left   . HEEL SPUR RESECTION Right 08/01/2015  Procedure: HEEL SPUR EXCISIONS;  Surgeon: Melrose Nakayama, MD;  Location: [facility];  Service: Orthopedics;  Laterality: Right;  . HEEL SPUR RESECTION Left 01/27/2016   Procedure: LEFT HEEL SPUR EXCISION AND TENDON ACHILLIES REPAIR;  Surgeon: Melrose Nakayama, MD;  Location: [facility];  Service: Orthopedics;  Laterality: Left;  PRONE POSITION  . TUBAL LIGATION      Social History   Social History  . Marital status: Married     Spouse name: N/A  . Number of children: N/A  . Years of education: N/A   Occupational History  . daycare teacher    Social History Main Topics  . Smoking status: Never Smoker  . Smokeless tobacco: Never Used     Comment: years ago may have smoked 1 cig or less a week  . Alcohol use No  . Drug use: No  . Sexual activity: Not on file   Other Topics Concern  . Not on file   Social History Narrative  . No narrative on file    Mobility: No assistive devices Work history: Works in childcare   Allergies  Allergen Reactions  . Aspirin Shortness Of Breath and Swelling  . Nsaids Shortness Of Breath and Swelling  . Penicillins Anaphylaxis, Swelling, Rash and Other (See Comments)    PCN reaction causing immediate rash, facial/tongue/throat swelling, SOB or lightheadedness with hypotension: YES PCN reaction causing severe rash involving mucus membranes or skin necrosis: YES PCN reaction that required hospitalization NO PCN reaction occurring within the last 10 years: NO   . Tolmetin Shortness Of Breath and Swelling  . Oxycodone-Acetaminophen Itching and Hives  . Percocet [Oxycodone-Acetaminophen] Hives and Itching  . Vicodin [Hydrocodone-Acetaminophen] Hives and Itching  . Tramadol Nausea Only    Family History  Problem Relation Age of Onset  . Hypertension Mother   . Pulmonary embolism Mother   . Breast cancer Maternal Aunt   . Brain cancer Cousin   . CAD Maternal Grandmother   . Microcephaly Maternal Grandmother   . Heart attack Maternal Grandmother      Prior to Admission medications   Medication Sig Start Date End Date Taking? Authorizing Provider  acetaminophen (TYLENOL) 500 MG tablet Take 2 tablets (1,000 mg total) by mouth every 6 (six) hours as needed. Patient taking differently: Take 1,000 mg by mouth every 6 (six) hours as needed for moderate pain or headache.  05/29/15   Olivia Canter Sam, PA-C  albuterol (PROVENTIL HFA;VENTOLIN HFA) 108 (90 Base) MCG/ACT inhaler  Inhale 2 puffs into the lungs every 6 (six) hours as needed for wheezing or shortness of breath. 09/09/15   Brand Males, MD  albuterol (PROVENTIL) (2.5 MG/3ML) 0.083% nebulizer solution Take 2.5 mg by nebulization 2 (two) times daily.    Historical Provider, MD  amLODipine (NORVASC) 5 MG tablet Take 1 tablet (5 mg total) by mouth daily. 03/19/16   Almyra Deforest, PA  BREO ELLIPTA 200-25 MCG/INH AEPB INHALE 1 PUFF INTO LUNGS DAILY 03/30/16   Brand Males, MD  chlorthalidone (HYGROTON) 25 MG tablet Take 25 mg by mouth every morning.    Historical Provider, MD  clonazePAM (KLONOPIN) 1 MG tablet Take 1 mg by mouth 3 (three) times daily as needed for anxiety.    Historical Provider, MD  cyclobenzaprine (FLEXERIL) 10 MG tablet Take 1 tablet (10 mg total) by mouth 2 (two) times daily as needed for muscle spasms. 01/22/16   Theodosia Quay, MD  fluticasone furoate-vilanterol (BREO ELLIPTA) 100-25 MCG/INH AEPB Inhale 1 puff into the lungs  daily.    Historical Provider, MD  HYDROmorphone (DILAUDID) 2 MG tablet Take 1-2 tablets (2-4 mg total) by mouth every 4 (four) hours as needed for severe pain. 01/27/16   Loni Dolly, PA-C  liraglutide (VICTOZA) 18 MG/3ML SOPN Inject 1.8 mg into the skin daily.    Historical Provider, MD  TOUJEO SOLOSTAR 300 UNIT/ML SOPN INJ 45 UNITS [place] ONCE D 12/20/15   Historical Provider, MD  valsartan (DIOVAN) 320 MG tablet Take 320 mg by mouth at bedtime.    Historical Provider, MD    Physical Exam: Vitals:   04/07/16 0900 04/07/16 0915 04/07/16 0929 04/07/16 0930  BP: 137/86 113/71  112/82  Pulse: 80 72  74  Resp: _0 Temp:      TempSrc:      SpO2: 96% 97%  97%  Weight:   (!) 183.6 kg (404 lb 12.2 oz)   Height:          Constitutional: NAD, calm, comfortable Eyes: PERRL, lids and conjunctivae normal ENMT: Mucous membranes are moist. Posterior pharynx clear of any exudate or lesions.Normal dentition.  Neck: normal, supple, no masses, no thyromegaly Respiratory: clear to  auscultation bilaterally, no wheezing, no crackles. Normal respiratory effort. No accessory muscle use.  Cardiovascular: Regular rate and rhythm, no murmurs / rubs / gallops. No extremity edema. 2+ pedal pulses. No carotid bruits.  Abdomen: no tenderness, no masses palpated. No hepatosplenomegaly. Bowel sounds positive.  Musculoskeletal: no clubbing / cyanosis. No joint deformity upper and lower extremities. Good ROM, no contractures. Normal muscle tone.  Skin: no rashes, lesions, ulcers. No induration Neurologic: CN 2-12 grossly intact. Sensation intact, DTR normal. Strength 5/5 x all 4 extremities.  Psychiatric: Normal judgment and insight. Alert and oriented x 3. Normal mood.    Labs on Admission: I have personally reviewed following labs and imaging studies  CBC:  Recent Labs Lab 04/07/16 0754  WBC 7.1  HGB 11.7*  HCT 36.2  MCV 86.0  PLT 300   Basic Metabolic Panel:  Recent Labs Lab 04/07/16 0754  NA 138  K 3.9  CL 106  CO2 22  GLUCOSE 99  BUN 12  CREATININE 1.02*  CALCIUM 8.7*   GFR: Estimated Creatinine Clearance: 117.3 mL/min (A) (by C-G formula based on SCr of 1.02 mg/dL (H)). Liver Function Tests: No results for input(s): AST, ALT, ALKPHOS, BILITOT, PROT, ALBUMIN in the last 168 hours. No results for input(s): LIPASE, AMYLASE in the last 168 hours. No results for input(s): AMMONIA in the last 168 hours. Coagulation Profile: No results for input(s): INR, PROTIME in the last 168 hours. Cardiac Enzymes: No results for input(s): CKTOTAL, CKMB, CKMBINDEX, TROPONINI in the last 168 hours. BNP (last 3 results) No results for input(s): PROBNP in the last 8760 hours. HbA1C: No results for input(s): HGBA1C in the last 72 hours. CBG: No results for input(s): GLUCAP in the last 168 hours. Lipid Profile: No results for input(s): CHOL, HDL, LDLCALC, TRIG, CHOLHDL, LDLDIRECT in the last 72 hours. Thyroid Function Tests: No results for input(s): TSH, T4TOTAL, FREET4,  T3FREE, THYROIDAB in the last 72 hours. Anemia Panel: No results for input(s): VITAMINB12, FOLATE, FERRITIN, TIBC, IRON, RETICCTPCT in the last 72 hours. Urine analysis:    Component Value Date/Time   COLORURINE YELLOW 01/22/2016 1058   APPEARANCEUR CLEAR 01/22/2016 1058   LABSPEC 1.019 01/22/2016 1058   PHURINE 5.0 01/22/2016 1058   GLUCOSEU NEGATIVE 01/22/2016 1058   HGBUR NEGATIVE 01/22/2016 1058   BILIRUBINUR NEGATIVE  01/22/2016 [place] 01/22/2016 1058   PROTEINUR NEGATIVE 01/22/2016 1058   UROBILINOGEN 1.0 07/14/2009 1644   NITRITE NEGATIVE 01/22/2016 1058   LEUKOCYTESUR NEGATIVE 01/22/2016 1058   Sepsis Labs: _0 (procalcitonin:4,lacticidven:4) )No results found for this or any previous visit (from the past 240 hour(s)).   Radiological Exams on Admission: Dg Chest 2 View  Result Date: 04/07/2016 CLINICAL DATA:  Shortness of Breath EXAM: CHEST  2 VIEW COMPARISON:  November 12, 2015 FINDINGS: There is no edema or consolidation. Heart size and pulmonary vascularity are normal. No adenopathy. No bone lesions. IMPRESSION: No edema or consolidation. Electronically Signed   By: Lowella Grip III M.D.   On: 04/07/2016 08:18    EKG: (Independently reviewed) Sinus rhythm with ventricular rate 82 bpm, QTC 472 ms, elevated J point in inferior leads  Assessment/Plan Principal Problem:   Chest pain -Patient presents with shortness of breath, dyspnea on exertion and chest pain with a pleuritic component -Given surgical procedure 2 months prior and patient's morbid obesity and complaints of discomfort in left leg for 1 week to rule out DVT/PE -Lower extremity venous duplex -CT angiogram of chest PE protocol; of note her mother is currently being treated for unprovoked PE -Cycle troponin -Echocardiogram -Could also be atypical presentation of pericarditis a check ESR and follow up on echo  Active Problems:   DOE (dyspnea on exertion) -Patient has chronic  dyspnea on exertion and reports current symptoms are different from baseline -She is diabetic and this dyspnea could reflect atypical cardiac ischemic presentation-the workup above -Could be deconditioning related to obesity-ambulatory pulse oximetry on room air    LVH (left ventricular hypertrophy) -Noted on last echocardiogram 2016 -Chest x-ray unremarkable but films may not be accurate and morbidly obese female so follow-up on CT chest -BNP within normal limits -Echo as above    Hypertension -Initial blood pressure elevated at 156/99 in the setting of acute respiratory symptoms -Current blood pressure readings within normal limits -Continue preadmission Norvasc, Hygroton and Diovan    Diabetes mellitus type 2, controlled, without complications  -Continue preadmission that Victoza and Toujeo -HgbA1c -SSI    Moderate persistent asthma -Follows with pulmonary as an outpatient -Continue preadmission albuterol MDI with scheduled albuterol neb, Breo Ellipta MDI    Morbid obesity/OSA on CPAP -Continue nocturnal CPAP      DVT prophylaxis: Lovenox  Code Status: Full Family Communication: Mother at bedside Disposition Plan: Home Consults called: None     ELLIS,ALLISON L. ANP-BC [facility] Pager [phone]   If 7PM-7AM, please contact night-coverage [email or web address] Password [facility]  04/07/2016, 11:15 AM

## 2016-04-07 NOTE — Progress Notes (Signed)
Placed patient on CPAP for the night at 19cm as per home settings.

## 2016-04-07 NOTE — Progress Notes (Signed)
Pt received from ED. Pt and her mother oriented to room and equipment. VSS. Telemetry applied, CCMD notified. Call bell within reach, will continue to monitor.   Leonidas Rombergaitlin S Bumbledare, RN

## 2016-04-07 NOTE — ED Provider Notes (Signed)
[facility]-EMERGENCY DEPT Provider Note   CSN: [id number] Arrival date & time: 04/07/16 0732     History    Chief Complaint  Patient presents with  . Shortness of Breath     HPI Melanie Hess is a 46 y.o. female.  46yo F w/ PMH including morbid obesity, OSA, T2DM, HTN, asthma who p/w Dyspnea. The patient reports a long-standing history of dyspnea with exertion that has become much worse since yesterday. She states that she becomes. Short of breath with minimal exertion such as going to her bathroom. She reports that yesterday she began having some left-sided chest pressure associated with the exertional shortness of breath. It resolved with rest. She has used her inhalers at home with no relief. She was up all night because the shortness of breath while trying to sleep was severe and constant. She also reports some low back pain. She denies any fever, cough/cold symptoms, or recent illness. No recent travel, history of blood clots, or history of cancer. Family history notable for heart disease in maternal grandmother.   Past Medical History:  Diagnosis Date  . Ankle fracture, right 2001  . Anxiety   . Arthritis   . Asthma   . Carpal tunnel syndrome    Bilateral  . Diabetes mellitus without complication (HCC)    Type II  . GERD (gastroesophageal reflux disease)   . Hypertension   . Migraine   . OSA (obstructive sleep apnea)   . Plantar fasciitis of right foot   . Pneumonia    hx  . Shingles 11/2015  . Shortness of breath dyspnea    with walking short distances     Patient Active Problem List   Diagnosis Date Noted  . Moderate persistent asthma 12/09/2015  . Acute respiratory failure with hypoxia (HCC) 11/08/2015  . Medication management 10/23/2015  . OSA on CPAP 10/23/2015  . Pain of right heel 08/01/2015  . Chronic pain   . SOB (shortness of breath)   . Respiratory distress 03/24/2014  . Anemia 03/24/2014  . Diabetes mellitus type 2, controlled, without  complications (HCC) 03/24/2014  . Asthma exacerbation 03/24/2014  . Acute respiratory failure (HCC) 03/24/2014  . Acute respiratory distress 03/24/2014  . Asthma 02/28/2014  . Hypertension 02/28/2014  . Morbid obesity (HCC) 02/28/2014  . DOE (dyspnea on exertion) 02/28/2014  . Cough 02/14/2014  . Dyspnea and respiratory abnormality 02/14/2014    Past Surgical History:  Procedure Laterality Date  . CARPAL TUNNEL RELEASE Left   . HEEL SPUR RESECTION Right 08/01/2015   Procedure: HEEL SPUR EXCISIONS;  Surgeon: Marcene Corning, MD;  Location: [facility];  Service: Orthopedics;  Laterality: Right;  . HEEL SPUR RESECTION Left 01/27/2016   Procedure: LEFT HEEL SPUR EXCISION AND TENDON ACHILLIES REPAIR;  Surgeon: Marcene Corning, MD;  Location: [facility] OR;  Service: Orthopedics;  Laterality: Left;  PRONE POSITION  . TUBAL LIGATION      OB History    No data available        Home Medications    Prior to Admission medications   Medication Sig Start Date End Date Taking? Authorizing Provider  acetaminophen (TYLENOL) 500 MG tablet Take 2 tablets (1,000 mg total) by mouth every 6 (six) hours as needed. Patient taking differently: Take 1,000 mg by mouth every 6 (six) hours as needed for moderate pain or headache.  05/29/15   Ace Gins Sam, PA-C  albuterol (PROVENTIL HFA;VENTOLIN HFA) 108 (90 Base) MCG/ACT inhaler Inhale 2 puffs into the lungs every  6 (six) hours as needed for wheezing or shortness of breath. 09/09/15   Kalman ShanMurali Ramaswamy, MD  albuterol (PROVENTIL) (2.5 MG/3ML) 0.083% nebulizer solution Take 2.5 mg by nebulization 2 (two) times daily.    Historical Provider, MD  amLODipine (NORVASC) 5 MG tablet Take 1 tablet (5 mg total) by mouth daily. 03/19/16   Azalee CourseHao Meng, PA  BREO ELLIPTA 200-25 MCG/INH AEPB INHALE 1 PUFF INTO LUNGS DAILY 03/30/16   Kalman ShanMurali Ramaswamy, MD  chlorthalidone (HYGROTON) 25 MG tablet Take 25 mg by mouth every morning.    Historical Provider, MD  clonazePAM (KLONOPIN) 1 MG tablet Take 1  mg by mouth 3 (three) times daily as needed for anxiety.    Historical Provider, MD  cyclobenzaprine (FLEXERIL) 10 MG tablet Take 1 tablet (10 mg total) by mouth 2 (two) times daily as needed for muscle spasms. 01/22/16   Madolyn FriezeVijay Nagpal, MD  fluticasone furoate-vilanterol (BREO ELLIPTA) 100-25 MCG/INH AEPB Inhale 1 puff into the lungs daily.    Historical Provider, MD  HYDROmorphone (DILAUDID) 2 MG tablet Take 1-2 tablets (2-4 mg total) by mouth every 4 (four) hours as needed for severe pain. 01/27/16   Elodia FlorenceAndrew Nida, PA-C  liraglutide (VICTOZA) 18 MG/3ML SOPN Inject 1.8 mg into the skin daily.    Historical Provider, MD  TOUJEO SOLOSTAR 300 UNIT/ML SOPN INJ 45 UNITS Braxton ONCE D 12/20/15   Historical Provider, MD  valsartan (DIOVAN) 320 MG tablet Take 320 mg by mouth at bedtime.    Historical Provider, MD      Family History  Problem Relation Age of Onset  . Hypertension Mother   . Breast cancer Maternal Aunt   . Brain cancer Cousin      Social History  Substance Use Topics  . Smoking status: Never Smoker  . Smokeless tobacco: Never Used     Comment: years ago may have smoked 1 cig or less a week  . Alcohol use No     Allergies     Aspirin; Nsaids; Penicillins; Tolmetin; Oxycodone-acetaminophen; Percocet [oxycodone-acetaminophen]; Vicodin [hydrocodone-acetaminophen]; and Tramadol    Review of Systems  10 Systems reviewed and are negative for acute change except as noted in the HPI.   Physical Exam Updated Vital Signs BP 112/82   Pulse 74   Temp 98 F (36.7 C) (Oral)   Resp 15   Ht 5\' 6"  (1.676 m)   Wt (!) 404 lb 12.2 oz (183.6 kg)   SpO2 97%   BMI 65.33 kg/m   Physical Exam  Constitutional: She is oriented to person, place, and time. She appears well-developed and well-nourished. No distress.  HENT:  Head: Normocephalic and atraumatic.  Moist mucous membranes  Eyes: Conjunctivae are normal. Pupils are equal, round, and reactive to light.  Neck: Neck supple.    Cardiovascular: Normal rate, regular rhythm and normal heart sounds.   No murmur heard. Pulmonary/Chest: Effort normal and breath sounds normal. She has no wheezes.  Abdominal: Soft. Bowel sounds are normal. She exhibits no distension. There is no tenderness.  Musculoskeletal: She exhibits edema (1+ pitting BLE).  Neurological: She is alert and oriented to person, place, and time.  Fluent speech  Skin: Skin is warm and dry.  Psychiatric: She has a normal mood and affect. Judgment normal.  Nursing note and vitals reviewed.     ED Treatments / Results  Labs (all labs ordered are listed, but only abnormal results are displayed) Labs Reviewed  BASIC METABOLIC PANEL - Abnormal; Notable for the following:  Result Value   Creatinine, Ser 1.02 (*)    Calcium 8.7 (*)    All other components within normal limits  CBC - Abnormal; Notable for the following:    Hemoglobin 11.7 (*)    RDW 16.2 (*)    All other components within normal limits  BRAIN NATRIURETIC PEPTIDE  TROPONIN I  TROPONIN I  TROPONIN I  D-DIMER, QUANTITATIVE (NOT AT [facility])  HEMOGLOBIN A1C  I-STAT TROPOININ, ED     EKG  EKG Interpretation  Date/Time:  Wednesday April 07 2016 07:46:24 EDT Ventricular Rate:  82 PR Interval:  122 QRS Duration: 86 QT Interval:  404 QTC Calculation: 472 R Axis:   47 Text Interpretation:  Normal sinus rhythm Normal ECG No significant change since last tracing Confirmed by LITTLE MD, RACHEL [phone]) on 04/07/2016 8:21:08 AM         Radiology Dg Chest 2 View  Result Date: 04/07/2016 CLINICAL DATA:  Shortness of Breath EXAM: CHEST  2 VIEW COMPARISON:  November 12, 2015 FINDINGS: There is no edema or consolidation. Heart size and pulmonary vascularity are normal. No adenopathy. No bone lesions. IMPRESSION: No edema or consolidation. Electronically Signed   By: Bretta Bang III M.D.   On: 04/07/2016 08:18    Procedures Procedures (including critical care  time) Procedures  Medications Ordered in ED  Medications  insulin aspart (novoLOG) injection 0-20 Units (not administered)  insulin aspart (novoLOG) injection 0-5 Units (not administered)     Initial Impression / Assessment and Plan / ED Course  I have reviewed the triage vital signs and the nursing notes.  Pertinent labs & imaging results that were available during my care of the patient were reviewed by me and considered in my medical decision making (see chart for details).    Pt w/ h/o SOB and OSA p/w worsening exertional dyspnea associated with chest tightness that has persisted since yesterday. Patient was awake all night because of severe shortness of breath when trying to lay flat to sleep. She was awake and alert, no respiratory distress at presentation. Vital signs unremarkable including O2 saturation 97% on room air. She did not have any obvious wheezes on exam. EKG unchanged from previous. Her lab work shows normal troponin, reassuring BMP and CBC, normal BNP. Chest x-ray negative for acute process. She has no risk factors for PE and given her history of symptoms I feel PE is less likely. I am concerned about the accelerating nature of her exertional SOB and chest pressure, as I do not see any recent ECHO or stress testing in her chart. Of note, the patient's weight has increased 8lb from previous cardiology visit 3 weeks ago. Given her sx, discussed with hospitalist, Jill Side, who will send d-dimer and admit pt for further workup.  Final Clinical Impressions(s) / ED Diagnoses   Final diagnoses:  Exertional dyspnea  Exertional chest pain     New Prescriptions   No medications on file       Laurence Spates, MD 04/07/16 1053

## 2016-04-07 NOTE — Progress Notes (Addendum)
Will hold Victoza and Hygroton initially since will receive IV contrast for CTA- will also give low rate IVFs at 50/hr x 12 hrs post CTA  Junious SilkAllison Isobella Ascher, ANP

## 2016-04-07 NOTE — ED Triage Notes (Signed)
Pt states she has been feeling SOB since yesterday. Pt took inhaler this morning with no relief. Pt states she has back pain and pain into her legs.

## 2016-04-08 ENCOUNTER — Observation Stay (HOSPITAL_COMMUNITY): Payer: Medicaid Other

## 2016-04-08 ENCOUNTER — Observation Stay (HOSPITAL_BASED_OUTPATIENT_CLINIC_OR_DEPARTMENT_OTHER): Payer: Medicaid Other

## 2016-04-08 ENCOUNTER — Encounter (HOSPITAL_COMMUNITY): Payer: Self-pay | Admitting: Radiology

## 2016-04-08 DIAGNOSIS — R0789 Other chest pain: Secondary | ICD-10-CM | POA: Diagnosis not present

## 2016-04-08 DIAGNOSIS — R072 Precordial pain: Secondary | ICD-10-CM

## 2016-04-08 DIAGNOSIS — R0602 Shortness of breath: Secondary | ICD-10-CM

## 2016-04-08 DIAGNOSIS — R079 Chest pain, unspecified: Secondary | ICD-10-CM | POA: Diagnosis not present

## 2016-04-08 LAB — GLUCOSE, CAPILLARY
GLUCOSE-CAPILLARY: 88 mg/dL (ref 65–99)
Glucose-Capillary: 102 mg/dL — ABNORMAL HIGH (ref 65–99)
Glucose-Capillary: 75 mg/dL (ref 65–99)

## 2016-04-08 LAB — HEMOGLOBIN A1C
Hgb A1c MFr Bld: 6.2 % — ABNORMAL HIGH (ref 4.8–5.6)
Mean Plasma Glucose: 131 mg/dL

## 2016-04-08 LAB — HIV ANTIBODY (ROUTINE TESTING W REFLEX): HIV Screen 4th Generation wRfx: NONREACTIVE

## 2016-04-08 MED ORDER — SODIUM CHLORIDE 0.9 % IV SOLN
INTRAVENOUS | Status: AC
Start: 2016-04-08 — End: 2016-04-08

## 2016-04-08 MED ORDER — NITROGLYCERIN 0.4 MG SL SUBL
0.8000 mg | SUBLINGUAL_TABLET | Freq: Once | SUBLINGUAL | Status: AC
  Administered 2016-04-08: 0.8 mg via SUBLINGUAL

## 2016-04-08 MED ORDER — METOPROLOL TARTRATE 5 MG/5ML IV SOLN
INTRAVENOUS | Status: AC
  Filled 2016-04-08: qty 5

## 2016-04-08 MED ORDER — IOPAMIDOL (ISOVUE-370) INJECTION 76%
INTRAVENOUS | Status: AC
  Administered 2016-04-08: 100 mL
  Filled 2016-04-08: qty 100

## 2016-04-08 MED ORDER — METOPROLOL TARTRATE 5 MG/5ML IV SOLN
5.0000 mg | Freq: Once | INTRAVENOUS | Status: AC
  Administered 2016-04-08: 5 mg via INTRAVENOUS

## 2016-04-08 MED ORDER — NITROGLYCERIN 0.4 MG SL SUBL
SUBLINGUAL_TABLET | SUBLINGUAL | Status: AC
  Filled 2016-04-08: qty 2

## 2016-04-08 NOTE — Evaluation (Signed)
Physical Therapy Evaluation Patient Details Name: TAKIRA SHERRIN MRN: [id number] DOB: 1970-06-16 Today's Date: 04/08/2016   History of Present Illness   Justeen Hehr Diaz-Ramirez is a 46 y.o. year old female with a history of asthma, HTN, DM II, OSA on CPAP, shingles and morbid obesity. PMH includes R anklesurgery July 2017 and L achilles tendom surgery Jan 2018    Clinical Impression  Pt admitted with above diagnosis. Pt currently with functional limitations due to the deficits listed below (see PT Problem List).  Pt will benefit from skilled PT to increase their independence and safety with mobility to allow discharge to the venue listed below.  Pt with c/o L ankle pain from recent surgery in January.  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  Encouraged pt to wear her boot and ambulate long distance from car into building with cane as well.  Pt with 2/4 dyspnea with o2 sat 98% on room air.  Will follow acutely, but do not feel any post acute PT is needed.     Follow Up Recommendations No PT follow up    Equipment Recommendations  None recommended by PT    Recommendations for Other Services       Precautions / Restrictions Precautions Precautions: None Restrictions Weight Bearing Restrictions: No      Mobility  Bed Mobility Overal bed mobility: Modified Independent             General bed mobility comments: bed flat with rail  Transfers Overall transfer level: Needs assistance Equipment used:  (IV pole) Transfers: Sit to/from Stand Sit to Stand: Supervision         General transfer comment: S with IV pole to stand  Ambulation/Gait Ambulation/Gait assistance: Supervision Ambulation Distance (Feet): 104 Feet   Gait Pattern/deviations: Wide base of support;Step-through pattern     General Gait Details: Pt  ambulated with IV pole with wide BOS. 2/4 dyspnea with o2 sat 98%  Stairs            Wheelchair Mobility    Modified Rankin (Stroke Patients Only)       Balance                                             Pertinent Vitals/Pain Pain Assessment: 0-10 Pain Score: 4  Pain Location: B ankles L > R can go up to a 10 at times Pain Descriptors / Indicators: Sharp Pain Intervention(s): Limited activity within patient's tolerance;Monitored during session    Home Living Family/patient expects to be discharged to:: Private residence Living Arrangements: Other relatives (niece) Available Help at Discharge: Friend(s);Available 24 hours/day Type of Home: Apartment Home Access: Level entry     Home Layout: One level Home Equipment: Cane - single point;Walker - 2 wheels;Bedside commode 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     Prior Function Level of Independence: Independent with assistive device(s)         Comments: Amb with cane for longer distances and furniture cruises     Hand Dominance   Dominant Hand: Right    Extremity/Trunk Assessment   Upper Extremity Assessment Upper Extremity Assessment: Overall WFL for tasks assessed    Lower Extremity  Assessment Lower Extremity Assessment: Overall WFL for tasks assessed;LLE deficits/detail LLE Deficits / Details: scar from recent L achilles surgery, decreased ROM, but WFL       Communication   Communication: No difficulties  Cognition Arousal/Alertness: Awake/alert Behavior During Therapy: WFL for tasks assessed/performed Overall Cognitive Status: Within Functional Limits for tasks assessed                      General Comments      Exercises     Assessment/Plan    PT Assessment Patient needs continued PT services  PT Problem List Decreased activity tolerance;Decreased mobility;Obesity;Cardiopulmonary status limiting activity       PT Treatment Interventions DME  instruction;Gait training;Functional mobility training;Therapeutic activities;Therapeutic exercise    PT Goals (Current goals can be found in the Care Plan section)  Acute Rehab PT Goals Patient Stated Goal: to be able to walk more PT Goal Formulation: With patient Time For Goal Achievement: 04/22/16    Frequency Min 3X/week   Barriers to discharge        Co-evaluation               End of Session Equipment Utilized During Treatment: Gait belt Activity Tolerance: Patient limited by fatigue Patient left: in bed;with nursing/sitter in room;with call bell/phone within reach;Other (comment) (IV team in and needed pt in the bed) Nurse Communication: Mobility status PT Visit Diagnosis: Difficulty in walking, not elsewhere classified (R26.2)    Functional Assessment Tool Used: AM-PAC 6 Clicks Basic Mobility Functional Limitation: Mobility: Walking and moving around Mobility: Walking and Moving Around Current Status (W0981(G8978): At least 20 percent but less than 40 percent impaired, limited or restricted Mobility: Walking and Moving Around Goal Status [phone](G8979): At least 1 percent but less than 20 percent impaired, limited or restricted    Time: 1005-1025 PT Time Calculation (min) (ACUTE ONLY): 20 min   Charges:   PT Evaluation $PT Eval Low Complexity: 1 Procedure     PT G Codes:   PT G-Codes **NOT FOR INPATIENT CLASS** Functional Assessment Tool Used: AM-PAC 6 Clicks Basic Mobility Functional Limitation: Mobility: Walking and moving around Mobility: Walking and Moving Around Current Status (W2956(G8978): At least 20 percent but less than 40 percent impaired, limited or restricted Mobility: Walking and Moving Around Goal Status [id number](G8979): At least 1 percent but less than 20 percent impaired, limited or restricted     Enzo MontgomeryKaren L Katelin Kutsch 04/08/2016, 11:03 AM

## 2016-04-08 NOTE — Progress Notes (Signed)
Pt given discharge instructions, medication lists, follow up appointments, and when to call the doctor.  Pt verbalizes understanding. Patient requesting work note.  I have paged Dr. Elisabeth Pigeonevine who does not have access to computer.  Paged Merdis DelayK. Schorr and Arvilla MarketA. Kakrakandy. Dr. Elisabeth Pigeonevine would like patient excused for one week. Patient family is here and patient states she will have to come back tomorrow to pick up note. Thomas HoffBurton, Kamren Heintzelman McClintock, RN

## 2016-04-08 NOTE — Progress Notes (Signed)
VASCULAR LAB PRELIMINARY  PRELIMINARY  PRELIMINARY  PRELIMINARY  Bilateral lower extremity venous duplex completed.    Preliminary report:  There is no DVT or SVT noted in the bilateral lower extremities.   Eduard Penkala, RVT 04/08/2016, 1:30 PM

## 2016-04-08 NOTE — Discharge Summary (Signed)
Physician Discharge Summary  Melanie Hess OZD:[id number] DOB: 27-Apr-1970 DOA: 04/07/2016  PCP: Melanie ChimeraEESE,BETTI D, MD  Admit date: 04/07/2016 Discharge date: 04/08/2016  Recommendations for Outpatient Follow-up:  1. Continue medications as prescribed 2. Follow up with PCP in 1-2 weeks to make sure symptoms are stable   Discharge Diagnoses:  Principal Problem:   Chest pain Active Problems:   Hypertension   Morbid obesity (HCC)   DOE (dyspnea on exertion)   Diabetes mellitus type 2, controlled, without complications (HCC)   OSA on CPAP   Moderate persistent asthma   LVH (left ventricular hypertrophy)   Hepatic steatosis    Discharge Condition: stable   Diet recommendation: as tolerated   History of present illness:   Per HPI "46 y.o. female with medical history significant for diabetes mellitus on insulin, hypertension, asthma followed by rheumatology, chronic dyspnea on exertion, sleep apnea on CPAP and morbid obesity. Patient presents with worsening of her underlying dyspnea on exertion. While at work she has noticed pain in her mid back as well as some discomfort in her left leg scribed as sharp pains and tingling. She is no longer able to walk across the classroom at her job without becoming short of breath and having left-sided chest pain. She has noticed increased fatigue. She also has a pleuritic component to the chest pain with increased chest pain with taking a deep breath and with leaning forward. The shortness of breath will resolve with rest but the chest pain is persistent. She also has what sounds like orthopnea noting she is unable to sleep even with utilization of her CPAP secondary to shortness of breath. Of note patient had repair of her left Achilles tendon done January 2018. She reports an 8 pound weight gain over the past 2 weeks."   Hospital Course:   Principal Problem:   Chest pain - Atypical  - EKG not acute - Seen by cardio - CT angio showed no pulm  embolism - LE doppler - no DVT - CTA to eval heart - no acute findings and okay per cardio to go home and follow up Dr. Rennis GoldenHilty on outpt basis   Active Problems:   Hypertension, essential - Continue home meds    Morbid obesity (HCC) - Body mass index is 65.33 kg/m. - Plan for in future gastric bypass     OSA on CPAP - Stable resp status    Signed:  Manson PasseyEVINE, Tula Schryver, MD  [facility] 04/08/2016, 5:42 PM  Pager #: [phone]  Time spent in minutes:less than 30 minutes  Discharge Exam: Vitals:   04/07/16 2240 04/08/16 0604  BP:  118/71  Pulse: 81 65  Resp: 17 17  Temp:  97.4 F (36.3 C)   Vitals:   04/07/16 2044 04/07/16 2107 04/07/16 2240 04/08/16 0604  BP:  133/70  118/71  Pulse:  77 81 65  Resp:  16 17 17   Temp:  97.8 F (36.6 C)  97.4 F (36.3 C)  TempSrc:  Oral  Axillary  SpO2: 98% 98% 97% 98%  Weight:      Height:        General: Pt is alert, follows commands appropriately, not in acute distress Cardiovascular: Regular rate and rhythm, S1/S2 +, no murmurs Respiratory: Clear to auscultation bilaterally, no wheezing, no crackles, no rhonchi Abdominal: Soft, non tender, non distended, bowel sounds +, no guarding Extremities: no edema, no cyanosis, pulses palpable bilaterally DP and PT Neuro: Grossly nonfocal  Discharge Instructions  Discharge Instructions  Call MD for:  persistant nausea and vomiting    Complete by:  As directed    Call MD for:  redness, tenderness, or signs of infection (pain, swelling, redness, odor or green/yellow discharge around incision site)    Complete by:  As directed    Call MD for:  severe uncontrolled pain    Complete by:  As directed    Diet - low sodium heart healthy    Complete by:  As directed    Increase activity slowly    Complete by:  As directed      Allergies as of 04/08/2016      Reactions   Aspirin Shortness Of Breath, Swelling   Nsaids Shortness Of Breath, Swelling   Penicillins Anaphylaxis,  Swelling, Rash, Other (See Comments)   PCN reaction causing immediate rash, facial/tongue/throat swelling, SOB or lightheadedness with hypotension: YES PCN reaction causing severe rash involving mucus membranes or skin necrosis: YES PCN reaction that required hospitalization NO PCN reaction occurring within the last 10 years: NO   Tolmetin Shortness Of Breath, Swelling   Oxycodone-acetaminophen Itching, Hives   Percocet [oxycodone-acetaminophen] Hives, Itching   Vicodin [hydrocodone-acetaminophen] Hives, Itching   Tramadol Nausea Only      Medication List    TAKE these medications   acetaminophen 500 MG tablet Commonly known as:  TYLENOL Take 2 tablets (1,000 mg total) by mouth every 6 (six) hours as needed. What changed:  reasons to take this   albuterol (2.5 MG/3ML) 0.083% nebulizer solution Commonly known as:  PROVENTIL Take 2.5 mg by nebulization 2 (two) times daily.   albuterol 108 (90 Base) MCG/ACT inhaler Commonly known as:  PROVENTIL HFA;VENTOLIN HFA Inhale 2 puffs into the lungs every 6 (six) hours as needed for wheezing or shortness of breath.   amLODipine 5 MG tablet Commonly known as:  NORVASC Take 1 tablet (5 mg total) by mouth daily.   BREO ELLIPTA 200-25 MCG/INH Aepb Generic drug:  fluticasone furoate-vilanterol INHALE 1 PUFF INTO LUNGS DAILY   chlorthalidone 25 MG tablet Commonly known as:  HYGROTON Take 25 mg by mouth every morning.   clonazePAM 1 MG tablet Commonly known as:  KLONOPIN Take 1 mg by mouth 3 (three) times daily as needed for anxiety.   cyclobenzaprine 10 MG tablet Commonly known as:  FLEXERIL Take 1 tablet (10 mg total) by mouth 2 (two) times daily as needed for muscle spasms.   pregabalin 75 MG capsule Commonly known as:  LYRICA Take 75 mg by mouth 2 (two) times daily.   TOUJEO SOLOSTAR 300 UNIT/ML Sopn Generic drug:  Insulin Glargine INJ 45 UNITS [place] ONCE Hess   valsartan 320 MG tablet Commonly known as:  DIOVAN Take 320 mg by  mouth at bedtime.   VICTOZA 18 MG/3ML Sopn Generic drug:  liraglutide Inject 1.8 mg into the skin daily.      Follow-up Information    REESE,BETTI D, MD. Schedule an appointment as soon as possible for a visit in 1 week(s).   Specialty:  Family Medicine Contact information: [address] [place] [address] [phone]            The results of significant diagnostics from this hospitalization (including imaging, microbiology, ancillary and laboratory) are listed below for reference.    Significant Diagnostic Studies: Dg Chest 2 View  Result Date: 04/07/2016 CLINICAL DATA:  Shortness of Breath EXAM: CHEST  2 VIEW COMPARISON:  November 12, 2015 FINDINGS: There is no edema or consolidation. Heart  size and pulmonary vascularity are normal. No adenopathy. No bone lesions. IMPRESSION: No edema or consolidation. Electronically Signed   By: Bretta Bang III M.Hess.   On: 04/07/2016 08:18   Ct Angio Chest Pe W Or Wo Contrast  Result Date: 04/07/2016 CLINICAL DATA:  Left upper chest pain with shortness of breath for 2 days. History of asthma. Evaluate for pulmonary embolism. EXAM: CT ANGIOGRAPHY CHEST WITH CONTRAST TECHNIQUE: Multidetector CT imaging of the chest was performed using the standard protocol during bolus administration of intravenous contrast. Multiplanar CT image reconstructions and MIPs were obtained to evaluate the vascular anatomy. CONTRAST:  100 ml Isovue 370. COMPARISON:  Radiographs 04/07/2016.  Chest CT 03/24/2014. FINDINGS: Cardiovascular: Opacification of the pulmonary arteries is adequate, although not optimal. There is no evidence of acute pulmonary embolism. No systemic arterial abnormalities are seen. The heart size is normal. There is no pericardial effusion. Mediastinum/Nodes: There are no enlarged mediastinal, hilar or axillary lymph nodes. The thyroid gland, trachea and esophagus demonstrate no significant findings. Lungs/Pleura: There is no  pleural effusion. There is no focal airspace disease or endobronchial lesion. Patchy hepatic ground-glass opacities are present bilaterally, likely related to atelectasis or small airways disease. Upper abdomen: The visualized liver demonstrates decreased density consistent with steatosis. No acute findings are evident within the visualized upper abdomen. Musculoskeletal/Chest wall: There is no chest wall mass or suspicious osseous finding. Review of the MIP images confirms the above findings. IMPRESSION: 1. No evidence of acute pulmonary embolism. 2. Patchy ground-glass densities dependently in both lungs consistent with atelectasis or small airways disease. 3. Hepatic steatosis. Electronically Signed   By: Carey Bullocks M.Hess.   On: 04/07/2016 14:29   Ct Coronary Morph W/cta Cor W/score W/ca W/cm &/or Wo/cm  Addendum Date: 04/08/2016   ADDENDUM REPORT: 04/08/2016 17:04 CLINICAL DATA:  46 year old female with chest pain, morbid obesity scheduled for a gastric bypass surgery. EXAM: Cardiac/Coronary  CT TECHNIQUE: The patient was scanned on a Philips 256 scanner. FINDINGS: A 120 kV prospective scan was triggered in the descending thoracic aorta at 111 HU's. Axial non-contrast 3 mm slices were carried out through the heart. The data set was analyzed on a dedicated work station and scored using the Agatson method. Gantry rotation speed was 270 msecs and collimation was .9 mm. 5 mg of iv Metoprolol and 0.8 mg of sl NTG was given. The 3D data set was reconstructed in 5% intervals of the 67-82 % of the R-R cycle. Diastolic phases were analyzed on a dedicated work station using MPR, MIP and VRT modes. The patient received 80 cc of contrast. This study is affected by poor contrast opacification, low signal to noise ratio sec to morbid obesity and by contrast in the right sided chambers. Aorta:  Normal size.  No calcifications.  No dissection. Aortic Valve:  Trileaflet.  No calcifications. Coronary Arteries:  Normal  coronary origin.  Right dominance. RCA is a large dominant artery that gives rise to PDA and PLVB. There is no significant plaque in the proximal and mid portions, distal portions/PDA/PLA are poorly visualized. Left main is a large artery that gives rise to LAD and LCX arteries. Left main artery has no plaque. LAD is a large vessel that gives rise to one diagonal branch. Proximal to mid LAD has no significant plaque, distal portion/diagonal branch are poorly visualized. LCX is a small non-dominant artery. Proximal to mid LCX artery has no significant plaque. Other findings: Normal pulmonary vein drainage into the left atrium. Normal let  atrial appendage without a thrombus. Dilated pulmonary artery measuring 34 mm suspicious for pulmonary hypertension. IMPRESSION: 1. Coronary calcium score of 0. This was 0 percentile for age and sex matched control. 2. Normal coronary origin with right dominance. 3. No evidence of CAD in the proximal to mid portions of all three coronary arteries. Interpretation of the distal portions and smaller branches precluded by significant artifact sec to patients size and poor contrast opacification. 4. Dilated pulmonary artery measuring 34 mm suspicious for pulmonary hypertension. Tobias Alexander Electronically Signed   By: Tobias Alexander   On: 04/08/2016 17:04   Result Date: 04/08/2016 EXAM: OVER-READ INTERPRETATION  CT CHEST The following report is an over-read performed by radiologist Dr. Royal Piedra [facility] on 04/08/2016. This over-read does not include interpretation of cardiac or coronary anatomy or pathology. The coronary calcium score/coronary CTA interpretation by the cardiologist is attached. COMPARISON:  Chest CT 04/07/2016. FINDINGS: Within the visualized portions of the thorax there are no suspicious appearing pulmonary nodules or masses, there is no acute consolidative airspace disease, no pleural effusions, no pneumothorax and no lymphadenopathy.  Visualized portions of the upper abdomen are unremarkable. There are no aggressive appearing lytic or blastic lesions noted in the visualized portions of the skeleton. IMPRESSION: 1. No significant incidental noncardiac findings are noted. Electronically Signed: By: Trudie Reed M.Hess. On: 04/08/2016 16:01   Dg Ugi W/high Density W/kub  Result Date: 03/15/2016 CLINICAL DATA:  Morbid obesity.  Preop for bariatric surgery. EXAM: UPPER GI SERIES WITH KUB TECHNIQUE: After obtaining a scout radiograph a routine upper GI series was performed using high-density barium FLUOROSCOPY TIME:  Fluoroscopy Time:  2 minutes and 12 seconds Radiation Exposure Index (if provided by the fluoroscopic device): 894 mGy Number of Acquired Spot Images: 5 COMPARISON:  CT 01/22/16 FINDINGS: Expected mild limitations secondary to patient body habitus. Preprocedure scout film is unremarkable. Double contrast evaluation of the stomach demonstrates no mass, hernia, or ulcer. Spontaneous gastroesophageal reflux is identified. Normal appearance of the duodenal bulb. Duodenal C-loop not well opacified. Evaluation of esophageal motility demonstrates proximal escape waves with contrast stasis in the mid and lower esophagus. Full column evaluation of the esophagus demonstrates no persistent narrowing or stricture. IMPRESSION: 1. 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 2. Otherwise, normal upper GI as detailed above. Electronically Signed   By: Jeronimo Greaves M.Hess.   On: 03/15/2016 09:21    Microbiology: No results found for this or any previous visit (from the past 240 hour(s)).   Labs: Basic Metabolic Panel:  Recent Labs Lab 04/07/16 0754  NA 138  K 3.9  CL 106  CO2 22  GLUCOSE 99  BUN 12  CREATININE 1.02*  CALCIUM 8.7*   Liver Function Tests: No results for input(s): AST, ALT, ALKPHOS, BILITOT, PROT, ALBUMIN in the last 168 hours. No results for input(s): LIPASE, AMYLASE in the last 168 hours. No  results for input(s): AMMONIA in the last 168 hours. CBC:  Recent Labs Lab 04/07/16 0754  WBC 7.1  HGB 11.7*  HCT 36.2  MCV 86.0  PLT 270   Cardiac Enzymes:  Recent Labs Lab 04/07/16 1047 04/07/16 1436 04/07/16 2215  TROPONINI <0.03 <0.03 <0.03   BNP: BNP (last 3 results)  Recent Labs  11/12/15 1815 04/07/16 0745  BNP 17.2 15.4    ProBNP (last 3 results) No results for input(s): PROBNP in the last 8760 hours.  CBG:  Recent Labs Lab 04/07/16 1720 04/07/16 2113 04/08/16 0603 04/08/16 1100 04/08/16  1625  GLUCAP 99 111* 102* 88 75

## 2016-04-08 NOTE — Consult Note (Signed)
CARDIOLOGY CONSULT NOTE   Patient ID: Melanie Hess MRN: [id number] DOB/AGE: 1970-03-28 46 y.o.  Admit date: 04/07/2016  Primary Physician   Karie Chimera, MD Primary Cardiologist   Dr [name], 11/12/2015 [name], PAC, 03/19/2016 Reason for Consultation   Chest pain Requesting MD: Dr Elisabeth Pigeon  WUJ:[id number] A Melanie Hess is a 46 y.o. year old female with a history of asthma, HTN, DM II, OSA on CPAP, shingles and morbid obesity. EF nl echo 2016  Seen in the office 03/02 for BP control, pt describes chest pain, generally has when emotionally stressed. No ischemic workup planned, f/u 6 months. Amlodipine 5 mg added. Gastric bypass planned.   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 It started while she was changing a child. She was already SOB because she had been walking around. She gets SOB and chest pain when she bends over. She gets CP when she lifts the children (toddlers 64-7 years old).   The pain is upper left chest, no radiation. 10/10+ pain. Says the pain did not change with deep inspiration but her chest wall is very tender. She went home and took a breathing treatment. She also took a Lyrica for her LLE pain and her back pain. The pain dropped to a 7/10 after the nebulizer. Yesterday, she woke up with the pain and came to the ER. She got nebulizers in the ER, which made the pain better. No ASA or NTG given. The pain improved and finally resolved about 9:30 last night. It has not come back.  She has sharp LLE pain, worse with exertion, and has back pain as well. She takes the Lyrica for these pains.  She is to have gastric bypass in June or July of this year.    Past Medical History:  Diagnosis Date  . Ankle fracture, right 2001  . Anxiety   . Arthritis   . Asthma   . Carpal tunnel syndrome    Bilateral  . Diabetes mellitus without complication (HCC)    Type II  . GERD (gastroesophageal reflux disease)   .  Hypertension   . Migraine   . OSA (obstructive sleep apnea)   . Plantar fasciitis of right foot   . Pneumonia    hx  . Shingles 11/2015  . Shortness of breath dyspnea    with walking short distances     Past Surgical History:  Procedure Laterality Date  . CARPAL TUNNEL RELEASE Left   . HEEL SPUR RESECTION Right 08/01/2015   Procedure: HEEL SPUR EXCISIONS;  Surgeon: Marcene Corning, MD;  Location: [facility] OR;  Service: Orthopedics;  Laterality: Right;  . HEEL SPUR RESECTION Left 01/27/2016   Procedure: LEFT HEEL SPUR EXCISION AND TENDON ACHILLIES REPAIR;  Surgeon: Marcene Corning, MD;  Location: [facility] OR;  Service: Orthopedics;  Laterality: Left;  PRONE POSITION  . TUBAL LIGATION      Allergies  Allergen Reactions  . Aspirin Shortness Of Breath and Swelling  . Nsaids Shortness Of Breath and Swelling  . Penicillins Anaphylaxis, Swelling, Rash and Other (See Comments)    PCN reaction causing immediate rash, facial/tongue/throat swelling, SOB or lightheadedness with hypotension: YES PCN reaction causing severe rash involving mucus membranes or skin necrosis: YES PCN reaction that required hospitalization NO PCN reaction occurring within the last 10 years: NO   . Tolmetin Shortness Of Breath and Swelling  . Oxycodone-Acetaminophen Itching and Hives  . Percocet [  Oxycodone-Acetaminophen] Hives and Itching  . Vicodin [Hydrocodone-Acetaminophen] Hives and Itching  . Tramadol Nausea Only    I have reviewed the patient's current medications . albuterol  2.5 mg Nebulization BID  . amLODipine  5 mg Oral Daily  . enoxaparin (LOVENOX) injection  40 mg Subcutaneous Q24H  . fluticasone furoate-vilanterol  1 puff Inhalation Daily  . insulin aspart  0-20 Units Subcutaneous TID WC  . insulin aspart  0-5 Units Subcutaneous QHS  . insulin glargine  45 Units Subcutaneous Daily  . irbesartan  300 mg Oral Daily  . pregabalin  75 mg Oral BID   . sodium chloride     acetaminophen, albuterol, clonazePAM,  cyclobenzaprine, ondansetron (ZOFRAN) IV  Medication Sig  acetaminophen (TYLENOL) 500 MG tablet Take 2 tablets (1,000 mg total) by mouth every 6 (six) hours as needed. Patient taking differently: Take 1,000 mg by mouth every 6 (six) hours as needed for moderate pain or headache.   albuterol (PROVENTIL HFA;VENTOLIN HFA) 108 (90 Base) MCG/ACT inhaler Inhale 2 puffs into the lungs every 6 (six) hours as needed for wheezing or shortness of breath.  albuterol (PROVENTIL) (2.5 MG/3ML) 0.083% nebulizer solution Take 2.5 mg by nebulization 2 (two) times daily.  amLODipine (NORVASC) 5 MG tablet Take 1 tablet (5 mg total) by mouth daily.  BREO ELLIPTA 200-25 MCG/INH AEPB INHALE 1 PUFF INTO LUNGS DAILY  chlorthalidone (HYGROTON) 25 MG tablet Take 25 mg by mouth every morning.  clonazePAM (KLONOPIN) 1 MG tablet Take 1 mg by mouth 3 (three) times daily as needed for anxiety.  cyclobenzaprine (FLEXERIL) 10 MG tablet Take 1 tablet (10 mg total) by mouth 2 (two) times daily as needed for muscle spasms.  liraglutide (VICTOZA) 18 MG/3ML SOPN Inject 1.8 mg into the skin daily.  pregabalin (LYRICA) 75 MG capsule Take 75 mg by mouth 2 (two) times daily.  TOUJEO SOLOSTAR 300 UNIT/ML SOPN INJ 45 UNITS [place] ONCE D  valsartan (DIOVAN) 320 MG tablet Take 320 mg by mouth at bedtime.     Social History   Social History  . Marital status: Married    Spouse name: N/A  . Number of children: N/A  . Years of education: N/A   Occupational History  . daycare teacher    Social History Main Topics  . Smoking status: Never Smoker  . Smokeless tobacco: Never Used     Comment: years ago may have smoked 1 cig or less a week  . Alcohol use No  . Drug use: No  . Sexual activity: Not on file   Other Topics Concern  . Not on file   Social History Narrative  . No narrative on file    Family Status  Relation Status  . Mother Alive  . Father Alive  . Maternal Aunt   . Cousin   . Maternal Grandmother    Family  History  Problem Relation Age of Onset  . Hypertension Mother   . Pulmonary embolism Mother   . Breast cancer Maternal Aunt   . Brain cancer Cousin   . CAD Maternal Grandmother   . Microcephaly Maternal Grandmother   . Heart attack Maternal Grandmother      ROS:  Full 14 point review of systems complete and found to be negative unless listed above.  Physical Exam: Blood pressure 118/71, pulse 65, temperature 97.4 F (36.3 C), temperature source Axillary, resp. rate 17, height 5\' 6"  (1.676 m), weight (!) 404 lb 12.2 oz (183.6 kg), SpO2 98 %.  General:  Well developed, well nourished, female in no acute distress Head: Eyes PERRLA, No xanthomas.   Normocephalic and atraumatic, oropharynx without edema or exudate. Dentition: good Lungs: clear bilaterally; upper L chest very tender Heart: HRRR S1 S2, no rub/gallop, no sig murmur. pulses are 2+ all 4 extrem.   Neck: No carotid bruits. No lymphadenopathy.  JVD not elevated Abdomen: Bowel sounds present, abdomen soft and non-tender without masses or hernias noted. Msk:  No spine or cva tenderness. No weakness, no joint deformities or effusions. Extremities: No clubbing or cyanosis. Trace-1+ edema.  Neuro: Alert and oriented X 3. No focal deficits noted. Psych:  Good affect, responds appropriately Skin: No rashes or lesions noted.  Labs:   Lab Results  Component Value Date   WBC 7.1 04/07/2016   HGB 11.7 (L) 04/07/2016   HCT 36.2 04/07/2016   MCV 86.0 04/07/2016   PLT 270 04/07/2016    Recent Labs Lab 04/07/16 0754  NA 138  K 3.9  CL 106  CO2 22  BUN 12  CREATININE 1.02*  CALCIUM 8.7*  GLUCOSE 99   No results found for: MG  Recent Labs  04/07/16 1047 04/07/16 1436 04/07/16 2215  TROPONINI <0.03 <0.03 <0.03    Recent Labs  04/07/16 0759  TROPIPOC 0.01   B Natriuretic Peptide  Date/Time Value Ref Range Status  04/07/2016 07:45 AM 15.4 0.0 - 100.0 pg/mL Final  11/12/2015 06:15 PM 17.2 0.0 - 100.0 pg/mL Final    Echo: 03/21 ordered 03/26/2014 - Left ventricle: There is a false tendon at the LV apex. The cavity size was normal. There was mild concentric hypertrophy. Systolic function was normal. The estimated ejection fraction was in the range of 55% to 60%. Wall motion was normal; there were no regional wall motion abnormalities. - Mitral valve: There was trivial regurgitation.  ECG:  03/21 SR, ?early repol No acute changes   Radiology:  Dg Chest 2 View Result Date: 04/07/2016 CLINICAL DATA:  Shortness of Breath EXAM: CHEST  2 VIEW COMPARISON:  November 12, 2015 FINDINGS: There is no edema or consolidation. Heart size and pulmonary vascularity are normal. No adenopathy. No bone lesions. IMPRESSION: No edema or consolidation. Electronically Signed   By: Bretta Bang III M.D.   On: 04/07/2016 08:18   Ct Angio Chest Pe W Or Wo Contrast Result Date: 04/07/2016 CLINICAL DATA:  Left upper chest pain with shortness of breath for 2 days. History of asthma. Evaluate for pulmonary embolism. EXAM: CT ANGIOGRAPHY CHEST WITH CONTRAST TECHNIQUE: Multidetector CT imaging of the chest was performed using the standard protocol during bolus administration of intravenous contrast. Multiplanar CT image reconstructions and MIPs were obtained to evaluate the vascular anatomy. CONTRAST:  100 ml Isovue 370. COMPARISON:  Radiographs 04/07/2016.  Chest CT 03/24/2014. FINDINGS: Cardiovascular: Opacification of the pulmonary arteries is adequate, although not optimal. There is no evidence of acute pulmonary embolism. No systemic arterial abnormalities are seen. The heart size is normal. There is no pericardial effusion. Mediastinum/Nodes: There are no enlarged mediastinal, hilar or axillary lymph nodes. The thyroid gland, trachea and esophagus demonstrate no significant findings. Lungs/Pleura: There is no pleural effusion. There is no focal airspace disease or endobronchial lesion. Patchy hepatic ground-glass  opacities are present bilaterally, likely related to atelectasis or small airways disease. Upper abdomen: The visualized liver demonstrates decreased density consistent with steatosis. No acute findings are evident within the visualized upper abdomen. Musculoskeletal/Chest wall: There is no chest wall mass or suspicious osseous finding. Review of the MIP  images confirms the above findings. IMPRESSION: 1. No evidence of acute pulmonary embolism. 2. Patchy ground-glass densities dependently in both lungs consistent with atelectasis or small airways disease. 3. Hepatic steatosis. Electronically Signed   By: Carey BullocksWilliam  Veazey M.D.   On: 04/07/2016 14:29    ASSESSMENT AND PLAN:   The patient was seen today by Dr Jens Somrenshaw, the patient evaluated and the data reviewed.  Principal Problem:   Chest pain - Ez neg MI and ECG not acute despite >24 hours continuous pain. - Pt describes CP w/ exertion, but SOB always comes first.  - However, pt planning to have gastric bypass, will probably need cardiac clearance - not sure which imaging modality would be best.   Otherwise, per IM Active Problems:   Hypertension   Morbid obesity (HCC)   DOE (dyspnea on exertion)   Diabetes mellitus type 2, controlled, without complications (HCC)   OSA on CPAP   Moderate persistent asthma   LVH (left ventricular hypertrophy)   Hepatic steatosis   Signed: Leanna BattlesBarrett, Rhonda, PA-C 04/08/2016 8:28 AM Beeper [phone]  Co-Sign MD As above, patient seen and examined. Briefly she is a 46 year old female with past medical history of morbid obesity, asthma, hypertension, diabetes mellitus, obstructive sleep apnea for evaluation of dyspnea and chest pain. Patient complains of increased dyspnea on exertion but this has been present for years. She has orthopnea and occasional mild pedal edema. She also has pain in the left chest that is described as sharp. It increases with certain movements. It has also been chronic. She was admitted  and cardiology asked to evaluate. CTA showed no pulmonary embolus. Cardiac markers are negative. Electrocardiogram shows sinus rhythm with no ST changes. Hemoglobin 11.7.  1 chest pain-symptoms are atypical and likely musculoskeletal. Electrocardiogram shows no ST changes and enzymes negative. Chest CT shows no pulmonary embolus.  2 dyspnea-likely multifactorial including morbid obesity, obesity hypoventilation syndrome and obstructive sleep apnea. Also with history of asthma.  3 morbid obesity-I think this is life-threatening. She is being evaluated for possible gastric bypass surgery. She will need preoperative evaluation prior to her surgery. She also has dyspnea and chest pain. She cannot ambulate well enough to perform a treadmill. She is too large for a nuclear study and imaging would likely be poor for consideration of dobutamine echocardiogram. I have discussed her with Dr. Delton SeeNelson and she feels we may be able to proceed with cardiac CTA after she reviewed her images from recent CTA for pulmonary embolus. We will arrange this study. We discussed the need for weight loss and diet. CTA negative she can be discharged later today and follow up with Dr Rennis GoldenHilty.  Olga MillersBrian Levin Dagostino, MD

## 2016-04-08 NOTE — Discharge Instructions (Addendum)
Chest Wall Pain Chest wall pain is pain in or around the bones and muscles of your chest. Sometimes, an injury causes this pain. Sometimes, the cause may not be known. This pain may take several weeks or longer to get better. Follow these instructions at home: Pay attention to any changes in your symptoms. Take these actions to help with your pain:  Rest as told by your doctor.  Avoid activities that cause pain. Try not to use your chest, belly (abdominal), or side muscles to lift heavy things.  If directed, apply ice to the painful area:  Put ice in a plastic bag.  Place a towel between your skin and the bag.  Leave the ice on for 20 minutes, 2-3 times per day.  Take over-the-counter and prescription medicines only as told by your doctor.  Do not use tobacco products, including cigarettes, chewing tobacco, and e-cigarettes. If you need help quitting, ask your doctor.  Keep all follow-up visits as told by your doctor. This is important. Contact a doctor if:  You have a fever.  Your chest pain gets worse.  You have new symptoms. Get help right away if:  You feel sick to your stomach (nauseous) or you throw up (vomit).  You feel sweaty or light-headed.  You have a cough with phlegm (sputum) or you cough up blood.  You are short of breath. This information is not intended to replace advice given to you by your health care provider. Make sure you discuss any questions you have with your health care provider. Document Released: 06/23/2007 Document Revised: 06/12/2015 Document Reviewed: 04/01/2014 Elsevier Interactive Patient Education  2017 [employer]  

## 2016-04-08 NOTE — Progress Notes (Signed)
Pt needs work note to be off for a week. Spoke with Dr. Sherril CroonKhorr. Pt will come back to hospital tomorrow to pick up work note. Please give the pt a call at [phone] and leave a message if no one answers.

## 2016-06-18 ENCOUNTER — Ambulatory Visit: Payer: Medicaid Other | Admitting: Internal Medicine

## 2016-07-06 ENCOUNTER — Ambulatory Visit: Payer: Medicaid Other | Admitting: Internal Medicine

## 2016-08-12 ENCOUNTER — Encounter: Payer: Self-pay | Admitting: Acute Care

## 2016-08-12 ENCOUNTER — Ambulatory Visit (INDEPENDENT_AMBULATORY_CARE_PROVIDER_SITE_OTHER): Payer: Medicaid Other | Admitting: Acute Care

## 2016-08-12 ENCOUNTER — Telehealth: Payer: Self-pay | Admitting: Acute Care

## 2016-08-12 DIAGNOSIS — Z01818 Encounter for other preprocedural examination: Secondary | ICD-10-CM

## 2016-08-12 DIAGNOSIS — J4540 Moderate persistent asthma, uncomplicated: Secondary | ICD-10-CM

## 2016-08-12 NOTE — Telephone Encounter (Signed)
ATC pt, calls could not go through.  Only 1 number listed on file.  Send MyChart message to patient as a reminder, as this is listed as her preferred method of contact per her SnapShot.  Nothing further needed.

## 2016-08-12 NOTE — Progress Notes (Signed)
History of Present Illness Melanie Hess is a 46 y.o. female with asthma and dyspnea. She is followed by Dr. Marchelle Gearingamaswamy.   08/12/2016 Surgical Clearance  Patient is here for surgical clearance for  gastric bypass.She is having Surgery at [facility] [place] by Dr. Lily PeerFernandez. She states she has had no issues with her asthma recently. She states she gets short winded if he forgets to take her Breo. She states she is using her Breo twice daily, instead of as instructed to take once daily . She has been using 1 puff in the morning and one at night. She has not had an asthma flare since November 2017 when she was admitted to the hospital. At that time she was treated with BiPAP, IV magnesium, and IV Solu-Medrol.. She is compliant with medication.She is using her rescue inhaler 2 times daily.     Arozullah Postperative Pulmonary Risk Score Comment Score  Type of surgery - abd ao aneurysm (27), thoracic (21), neurosurgery / upper abdominal / vascular (21), neck (11) 27   Emergency Surgery - (11) 0   ALbumin < 3 or poor nutritional state - (9) ?   BUN > 30 -  (8) ?   Partial or completely dependent functional status - (7) 0   COPD -  (6) 0   Age - 60 to 169 (4), > 70  (6) 0   TOTAL 27+   Risk Stratifcation scores  - < 10, 11-19, 20-27, 28-40, >40 27 27     CANET Postperative Pulmonary Risk Score Comment Score  Age - <50 (0), 50-80 (3), >80 (16) 0   Preoperative pulse ox - >96 (0), 91-95 (8), <90 (24) 0   Respiratory infection in last month - Yes (17) 0   Preoperative anemia - < 10gm% - Yes (11) ?   Surgical incision - Upper abdominal (15), Thoracic (24) 15   Duration of surgery - <2h (0), 2-3h (16), >3h (23) 16   Emergency Surgery - Yes (8) 0   TOTAL 31 31  Risk Stratification - Low (<26), Intermediate (26-44), High (>45)     Patient is at intermediate risk for postoperative pulmonary complications. If surgical procedure goes longer than risk increases There were  several data points unavailable to determine postoperative pulmonary risk score, therefore this is best guess without those factors  Risk ameliorating factors are  Major Pulmonary risks identified in the multifactorial risk analysis are but not limited to a) pneumonia; b) recurrent intubation risk; c) prolonged or recurrent acute respiratory failure needing mechanical ventilation; d) prolonged hospitalization; e) DVT/Pulmonary embolism; f) Acute Pulmonary edema  Recommend 1. Short duration of surgery as much as possible and avoid paralytic if possible 2. Recovery in step down or ICU with Pulmonary consultation 3. DVT prophylaxis 4. Aggressive pulmonary toilet with o2, scheduled nebulized bronchodilatation, and incentive spirometry and early ambulation 5. Please take CPAP/BiPAP device to the hospital so it can be worn postoperatively(   Test Results: Chest x-ray 04/07/2016 IMPRESSION: No edema or consolidation.  CBC Latest Ref Rng & Units 04/07/2016 01/22/2016 01/21/2016  WBC 4.0 - 10.5 K/uL 7.1 6.2 7.7  Hemoglobin 12.0 - 15.0 g/dL 11.7(L) 12.0 12.2  Hematocrit 36.0 - 46.0 % 36.2 37.7 36.8  Platelets 150 - 400 K/uL 270 276 300    BMP Latest Ref Rng & Units 04/07/2016 01/22/2016 01/21/2016  Glucose 65 - 99 mg/dL 99 956(O107(H) 130(Q116(H)  BUN 6 - 20 mg/dL 12 9 9   Creatinine 0.44 -  1.00 mg/dL 1.61(W) 9.60(A) 5.40(J)  Sodium 135 - 145 mmol/L 138 138 140  Potassium 3.5 - 5.1 mmol/L 3.9 4.3 4.3  Chloride 101 - 111 mmol/L 106 104 104  CO2 22 - 32 mmol/L 22 25 28   Calcium 8.9 - 10.3 mg/dL 8.1(X) 9.1 9.1    BNP    Component Value Date/Time   BNP 15.4 04/07/2016 0745    ProBNP    Component Value Date/Time   PROBNP 36.0 12/31/2013 0935    PFT    Component Value Date/Time   FEV1PRE 1.87 02/26/2014 1439   FEV1POST 1.40 02/26/2014 1439   FVCPRE 2.52 02/26/2014 1439   FVCPOST 2.02 02/26/2014 1439   PREFEV1FVCRT 74 02/26/2014 1439   PSTFEV1FVCRT 69 02/26/2014 1439    No results  found.   Past medical hx Past Medical History:  Diagnosis Date  . Ankle fracture, right 2001  . Anxiety   . Arthritis   . Asthma   . Carpal tunnel syndrome    Bilateral  . Diabetes mellitus without complication (HCC)    Type II  . GERD (gastroesophageal reflux disease)   . Hypertension   . Migraine   . OSA (obstructive sleep apnea)   . Plantar fasciitis of right foot   . Pneumonia    hx  . Shingles 11/2015  . Shortness of breath dyspnea    with walking short distances     Social History  Substance Use Topics  . Smoking status: Never Smoker  . Smokeless tobacco: Never Used     Comment: years ago may have smoked 1 cig or less a week  . Alcohol use No    Tobacco Cessation: Patient is a never smoker  Past surgical hx, Family hx, Social hx all reviewed.  Current Outpatient Prescriptions on File Prior to Visit  Medication Sig  . acetaminophen (TYLENOL) 500 MG tablet Take 2 tablets (1,000 mg total) by mouth every 6 (six) hours as needed. (Patient taking differently: Take 1,000 mg by mouth every 6 (six) hours as needed for moderate pain or headache. )  . albuterol (PROVENTIL HFA;VENTOLIN HFA) 108 (90 Base) MCG/ACT inhaler Inhale 2 puffs into the lungs every 6 (six) hours as needed for wheezing or shortness of breath.  Marland Kitchen albuterol (PROVENTIL) (2.5 MG/3ML) 0.083% nebulizer solution Take 2.5 mg by nebulization 2 (two) times daily.  Marland Kitchen amLODipine (NORVASC) 5 MG tablet Take 1 tablet (5 mg total) by mouth daily. (Patient taking differently: Take 10 mg by mouth daily. )  . BREO ELLIPTA 200-25 MCG/INH AEPB INHALE 1 PUFF INTO LUNGS DAILY  . chlorthalidone (HYGROTON) 25 MG tablet Take 25 mg by mouth every morning.  . clonazePAM (KLONOPIN) 1 MG tablet Take 1 mg by mouth 3 (three) times daily as needed for anxiety.  . cyclobenzaprine (FLEXERIL) 10 MG tablet Take 1 tablet (10 mg total) by mouth 2 (two) times daily as needed for muscle spasms.  Marland Kitchen liraglutide (VICTOZA) 18 MG/3ML SOPN Inject  1.8 mg into the skin daily.  . pregabalin (LYRICA) 75 MG capsule Take 75 mg by mouth 2 (two) times daily.  Nathen May SOLOSTAR 300 UNIT/ML SOPN INJ 45 UNITS [place] ONCE D  . valsartan (DIOVAN) 320 MG tablet Take 320 mg by mouth at bedtime.   No current facility-administered medications on file prior to visit.      Allergies  Allergen Reactions  . Aspirin Shortness Of Breath and Swelling  . Nsaids Shortness Of Breath and Swelling  . Penicillins Anaphylaxis, Swelling, Rash and  Other (See Comments)    PCN reaction causing immediate rash, facial/tongue/throat swelling, SOB or lightheadedness with hypotension: YES PCN reaction causing severe rash involving mucus membranes or skin necrosis: YES PCN reaction that required hospitalization NO PCN reaction occurring within the last 10 years: NO   . Tolmetin Shortness Of Breath and Swelling  . Oxycodone-Acetaminophen Itching and Hives  . Percocet [Oxycodone-Acetaminophen] Hives and Itching  . Vicodin [Hydrocodone-Acetaminophen] Hives and Itching  . Tramadol Nausea Only    Review Of Systems:  Constitutional:   No  weight loss, night sweats,  Fevers, chills, fatigue, or  lassitude.  HEENT:   No headaches,  Difficulty swallowing,  Tooth/dental problems, or  Sore throat,                No sneezing, itching, ear ache, nasal congestion, post nasal drip,   CV:  No chest pain,  Orthopnea, PND, swelling in lower extremities, anasarca, dizziness, palpitations, syncope.   GI  No heartburn, indigestion, abdominal pain, nausea, vomiting, diarrhea, change in bowel habits, loss of appetite, bloody stools.   Resp: No shortness of breath with exertion or at rest.  No excess mucus, no productive cough,  No non-productive cough,  No coughing up of blood.  No change in color of mucus.  No wheezing.  No chest wall deformity  Skin: no rash or lesions.  GU: no dysuria, change in color of urine, no urgency or frequency.  No flank pain, no hematuria   MS:  No joint  pain or swelling.  No decreased range of motion.  No back pain.  Psych:  No change in mood or affect. No depression or anxiety.  No memory loss.   Vital Signs BP 128/68 (BP Location: Right Wrist, Cuff Size: Normal)   Pulse 68   Ht 5\' 6"  (1.676 m)   Wt (!) 398 lb 3.2 oz (180.6 kg)   SpO2 100%   BMI 64.27 kg/m    Physical Exam:  General- No distress,  A&Ox3, pleasant ENT: No sinus tenderness, TM clear, pale nasal mucosa, no oral exudate,no post nasal drip, no LAN Cardiac: S1, S2, regular rate and rhythm, no murmur Chest: No wheeze/ rales/ dullness; no accessory muscle use, no nasal flaring, no sternal retractions Abd.: Soft Non-tender, nondistended, bowel sounds positive, obese Ext: No clubbing cyanosis, edema Neuro:  normal strength Skin: No rashes, warm and dry Psych: normal mood and behavior   Assessment/Plan  Preoperative clearance Patient is requesting preop clearance for laparoscopic gastric bypass Arozullah Postperative Pulmonary Risk Score= 27+ CANET Postperative Pulmonary Risk Score -= 31 You are at moderate risk for pulmonary complications after surgery We will clear you for surgery with the following recommendations.  Recommend 1. Short duration of surgery as much as possible and avoid paralytic if possible 2. Recovery in step down or ICU with Pulmonary consultation 3. DVT prophylaxis 4. Aggressive pulmonary toilet with o2, scheduled nebulized bronchodilatation, and incentive spirometry and early ambulation 5. Please take CPAP/BiPAP device to the hospital so it can be worn postoperatively( CPAP is not managed Ray) Follow up with Dr. Marchelle Gearingamaswamy after surgery as needed. Please contact office for sooner follow up if symptoms do not improve or worsen or seek emergency care       Bevelyn NgoSarah F Jaclynne Baldo, NP 08/12/2016  5:03 PM

## 2016-08-12 NOTE — Patient Instructions (Addendum)
It is nice to meet you today. You are at moderate risk for pulmonary complications after surgery We will clear you for surgery with the following recommendations.  Recommend 1. Short duration of surgery as much as possible and avoid paralytic if possible 2. Recovery in step down or ICU with Pulmonary consultation 3. DVT prophylaxis 4. Aggressive pulmonary toilet with o2, scheduled nebulized bronchodilatation, and incentive spirometry and early ambulation 5. Please take CPAP/BiPAP device to the hospital so it can be worn postoperatively( CPAP is not managed [place]) Follow up with Dr. Marchelle Gearingamaswamy after surgery as needed. Please contact office for sooner follow up if symptoms do not improve or worsen or seek emergency care

## 2016-08-12 NOTE — Telephone Encounter (Signed)
Melanie Hess please call patient and reminded her to take her CPAP/BiPAP machine with her to the hospital when she has her procedure. We do not manage her CPAP, however there is notation that she is currently receiving therapy. Thank you so much

## 2016-08-12 NOTE — Assessment & Plan Note (Signed)
Patient is requesting preop clearance for laparoscopic gastric bypass Arozullah Postperative Pulmonary Risk Score= 27+ CANET Postperative Pulmonary Risk Score -= 31 You are at moderate risk for pulmonary complications after surgery We will clear you for surgery with the following recommendations.  Recommend 1. Short duration of surgery as much as possible and avoid paralytic if possible 2. Recovery in step down or ICU with Pulmonary consultation 3. DVT prophylaxis 4. Aggressive pulmonary toilet with o2, scheduled nebulized bronchodilatation, and incentive spirometry and early ambulation 5. Please take CPAP/BiPAP device to the hospital so it can be worn postoperatively( CPAP is not managed [place]) Follow up with Dr. Marchelle Gearingamaswamy after surgery as needed. Please contact office for sooner follow up if symptoms do not improve or worsen or seek emergency care

## 2016-09-29 ENCOUNTER — Other Ambulatory Visit: Payer: Self-pay | Admitting: Internal Medicine

## 2016-10-05 IMAGING — CR DG FOOT COMPLETE 3+V*R*
3 series · 3 of 3 positions shown · non-contrast
Comparison: None.

CLINICAL DATA: Acute right foot pain after fall today. Initial
encounter.

EXAM:
RIGHT FOOT COMPLETE - 3+ VIEW

[x foot lat right]
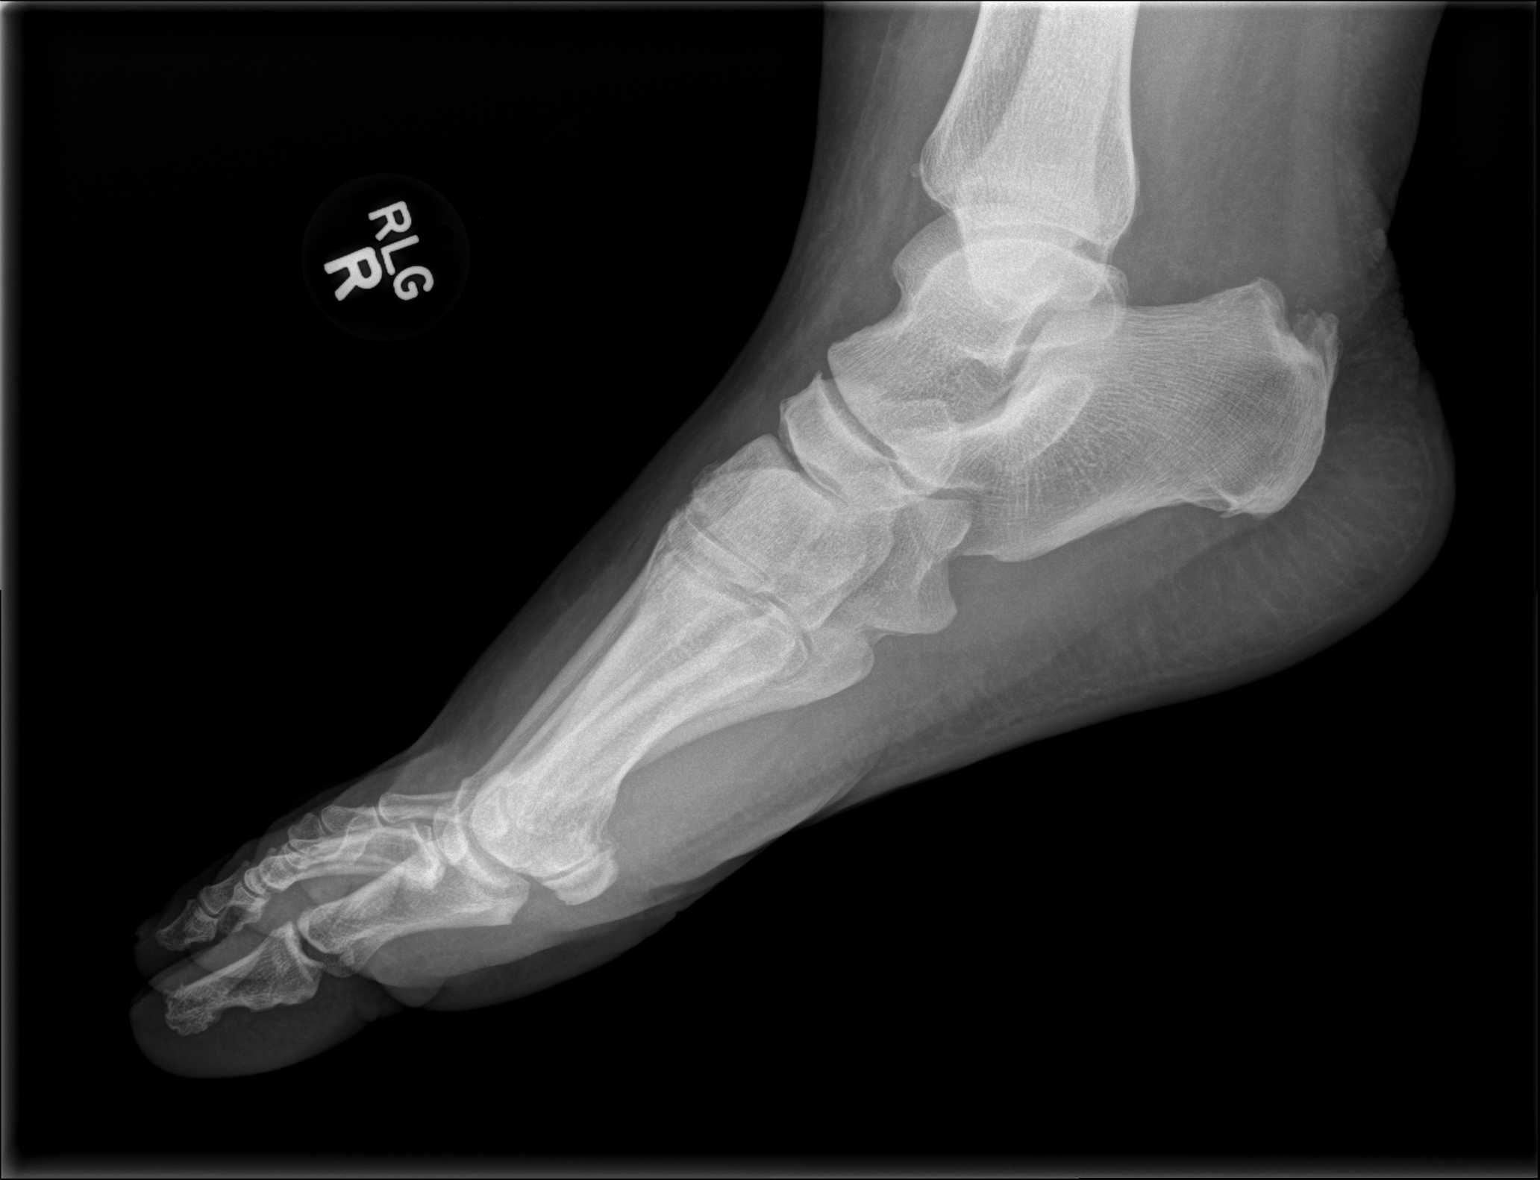

[x foot ap right]
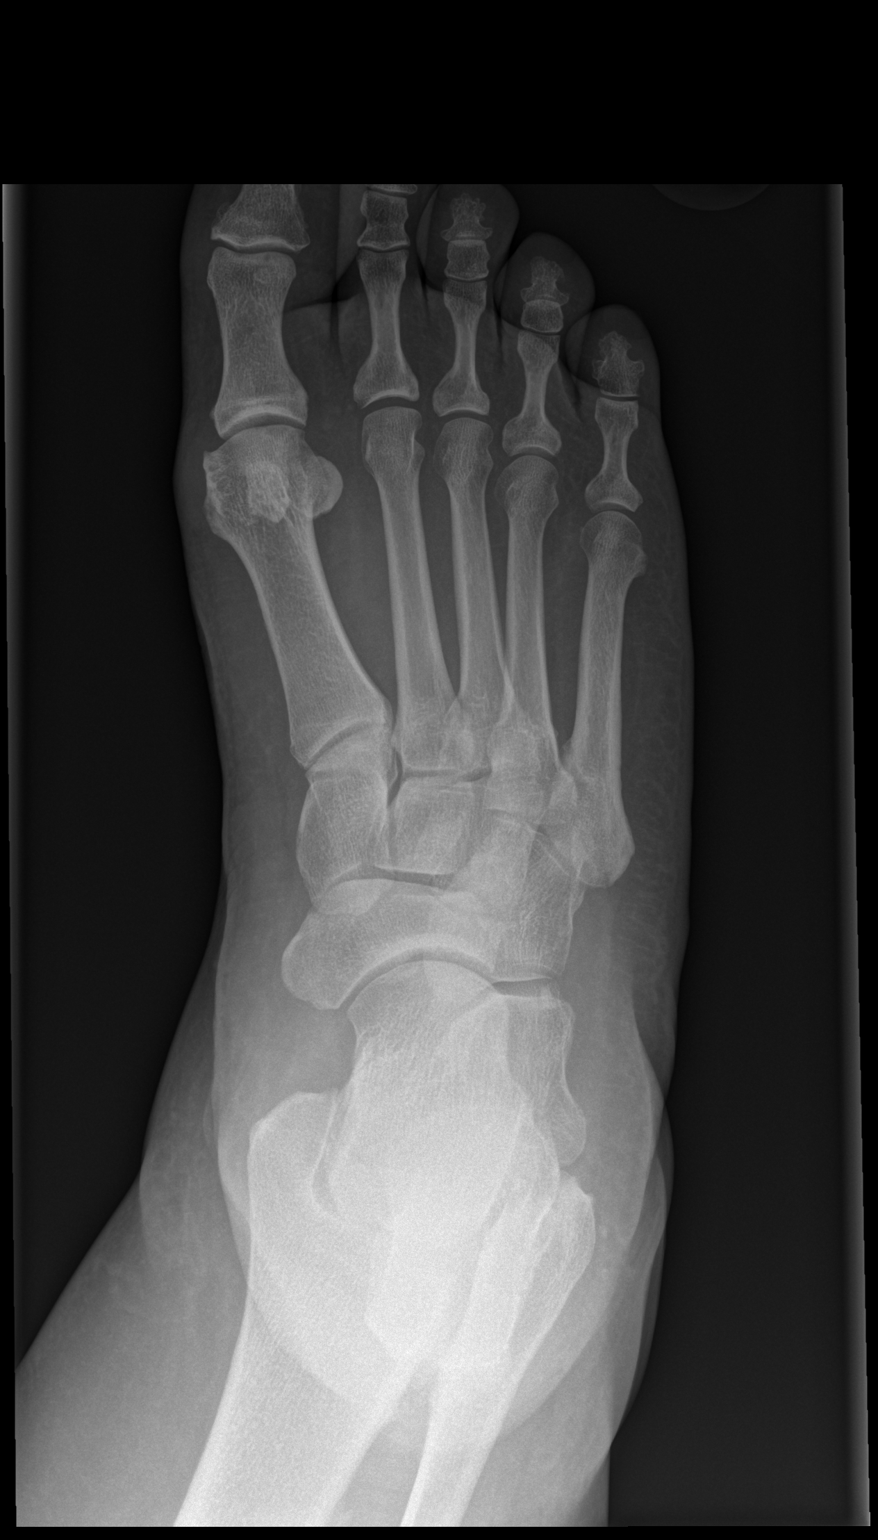

[x foot obl right]
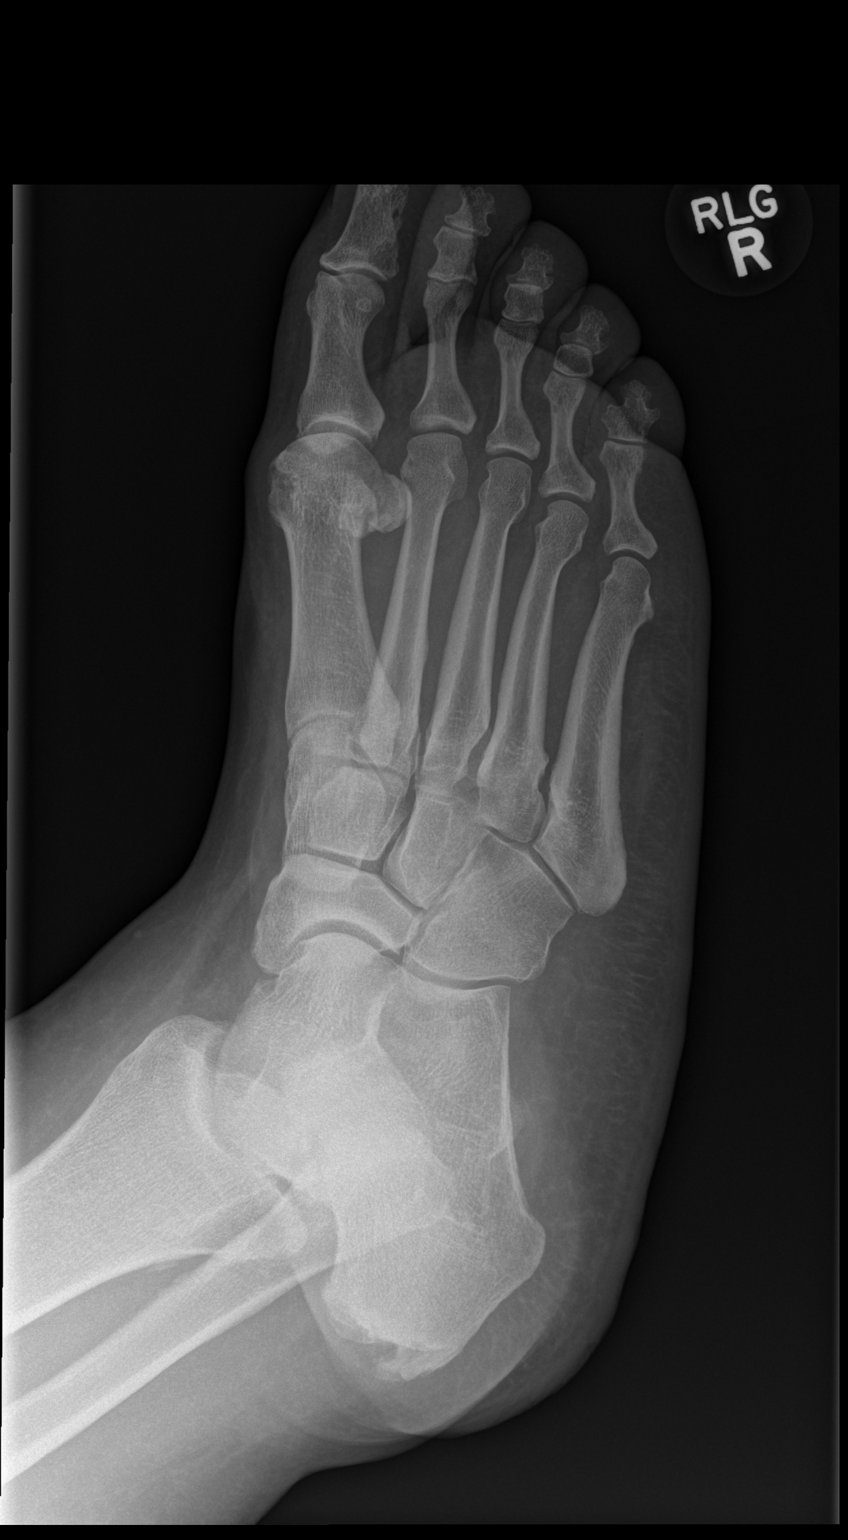

[3 of 3 positions shown; findings below may reference images not displayed]

FINDINGS: There is no evidence of fracture or dislocation. There is no
evidence of arthropathy. Spurring of posterior calcaneus is noted.
Soft tissues are unremarkable.
IMPRESSION: No acute abnormality seen in the right foot.

## 2016-10-09 ENCOUNTER — Emergency Department (HOSPITAL_COMMUNITY)
Admission: EM | Admit: 2016-10-09 | Discharge: 2016-10-09 | Disposition: A | Discharge status: Home / Self Care | Payer: Medicaid Other | Attending: Emergency Medicine | Admitting: Emergency Medicine

## 2016-10-09 ENCOUNTER — Emergency Department (HOSPITAL_COMMUNITY): Payer: Medicaid Other

## 2016-10-09 DIAGNOSIS — R079 Chest pain, unspecified: Secondary | ICD-10-CM | POA: Insufficient documentation

## 2016-10-09 DIAGNOSIS — I10 Essential (primary) hypertension: Secondary | ICD-10-CM | POA: Diagnosis not present

## 2016-10-09 DIAGNOSIS — Z885 Allergy status to narcotic agent status: Secondary | ICD-10-CM | POA: Diagnosis not present

## 2016-10-09 DIAGNOSIS — Z79899 Other long term (current) drug therapy: Secondary | ICD-10-CM | POA: Insufficient documentation

## 2016-10-09 DIAGNOSIS — Z880 Allergy status to penicillin: Secondary | ICD-10-CM | POA: Insufficient documentation

## 2016-10-09 DIAGNOSIS — R0602 Shortness of breath: Secondary | ICD-10-CM

## 2016-10-09 DIAGNOSIS — J45909 Unspecified asthma, uncomplicated: Secondary | ICD-10-CM | POA: Diagnosis not present

## 2016-10-09 DIAGNOSIS — E119 Type 2 diabetes mellitus without complications: Secondary | ICD-10-CM | POA: Insufficient documentation

## 2016-10-09 LAB — CBC
HCT: 36.6 % (ref 36.0–46.0)
HEMOGLOBIN: 11.8 g/dL — AB (ref 12.0–15.0)
MCH: 27.8 pg (ref 26.0–34.0)
MCHC: 32.2 g/dL (ref 30.0–36.0)
MCV: 86.1 fL (ref 78.0–100.0)
Platelets: 274 10*3/uL (ref 150–400)
RBC: 4.25 MIL/uL (ref 3.87–5.11)
RDW: 15.7 % — AB (ref 11.5–15.5)
WBC: 6.8 10*3/uL (ref 4.0–10.5)

## 2016-10-09 LAB — BASIC METABOLIC PANEL
Anion gap: 8 (ref 5–15)
BUN: 11 mg/dL (ref 6–20)
CHLORIDE: 109 mmol/L (ref 101–111)
CO2: 22 mmol/L (ref 22–32)
CREATININE: 1.21 mg/dL — AB (ref 0.44–1.00)
Calcium: 8.5 mg/dL — ABNORMAL LOW (ref 8.9–10.3)
GFR, EST NON AFRICAN AMERICAN: 52 mL/min — AB (ref >60)
Glucose, Bld: 106 mg/dL — ABNORMAL HIGH (ref 65–99)
Potassium: 3.5 mmol/L (ref 3.5–5.1)
SODIUM: 139 mmol/L (ref 135–145)

## 2016-10-09 LAB — I-STAT TROPONIN, ED: Troponin i, poc: 0.01 ng/mL (ref 0.00–0.08)

## 2016-10-09 MED ORDER — METHYLPREDNISOLONE SODIUM SUCC 125 MG IJ SOLR
125.0000 mg | Freq: Once | INTRAMUSCULAR | Status: AC
  Administered 2016-10-09: 125 mg via INTRAMUSCULAR
  Filled 2016-10-09: qty 2

## 2016-10-09 MED ORDER — ALBUTEROL SULFATE (2.5 MG/3ML) 0.083% IN NEBU
INHALATION_SOLUTION | RESPIRATORY_TRACT | Status: AC
  Filled 2016-10-09: qty 6

## 2016-10-09 MED ORDER — IPRATROPIUM BROMIDE 0.02 % IN SOLN
0.5000 mg | Freq: Once | RESPIRATORY_TRACT | Status: AC
  Administered 2016-10-09: 0.5 mg via RESPIRATORY_TRACT
  Filled 2016-10-09: qty 2.5

## 2016-10-09 MED ORDER — ALBUTEROL SULFATE (2.5 MG/3ML) 0.083% IN NEBU
5.0000 mg | INHALATION_SOLUTION | Freq: Once | RESPIRATORY_TRACT | Status: AC
  Administered 2016-10-09: 5 mg via RESPIRATORY_TRACT

## 2016-10-09 MED ORDER — ALBUTEROL SULFATE (2.5 MG/3ML) 0.083% IN NEBU
5.0000 mg | INHALATION_SOLUTION | Freq: Once | RESPIRATORY_TRACT | Status: AC
  Administered 2016-10-09: 5 mg via RESPIRATORY_TRACT
  Filled 2016-10-09: qty 6

## 2016-10-09 MED ORDER — ALBUTEROL SULFATE (2.5 MG/3ML) 0.083% IN NEBU: 5.0000 mg | INHALATION_SOLUTION | Freq: Once | RESPIRATORY_TRACT | Status: DC

## 2016-10-09 NOTE — ED Notes (Signed)
Lisa PA at bedside

## 2016-10-09 NOTE — Discharge Instructions (Signed)
As we discussed, your labs and x-ray today looks good. I recommend that you talk with your primary care doctor or her pulmonologist about getting a new strap for your CPAP if it is too tight as you do need to wear this for adequate breathing during the night. Monitor your symptoms closely, return here for any new or worsening symptoms/changes.

## 2016-10-09 NOTE — ED Triage Notes (Signed)
Reports sudden onset of SOB this morning and chest pressure.  Labored breathing noted in triage.

## 2016-10-09 NOTE — ED Provider Notes (Signed)
[facility]-EMERGENCY DEPT Provider Note   CSN: [id number] Arrival date & time: 10/09/16  0217     History   Chief Complaint Chief Complaint  Patient presents with  . Shortness of Breath  . Chest Pain    HPI Melanie Hess is a 46 y.o. female.  The history is provided by medical records and the patient.  Shortness of Breath  Associated symptoms include cough and chest pain.  Chest Pain   Associated symptoms include cough and shortness of breath.     46 year old female with history of anxiety, arthritis, asthma, diabetes, GERD, hypertension, sleep apnea on CPAP, history of chronic DOE, presenting to the ED with shortness of breath. Patient states she was asleep this evening without her CPAP and she awoke suddenly and felt very short of breath. States this scared her so she did a nebulizer treatment at home. She did not have much relief and was worried so she decided to come in for evaluation. She was given additional breathing treatment in triage here with some improvement. States she feels like her lungs are not expanding all the way and she is having trouble getting a deep breath. She denies any pain with deep inspiration. States it just feels tight like there is a band around her chest. She denies any recent fever or chills. Has had a slight dry cough. No nasal congestion, sore throat, or other upper respiratory symptoms. States she has been seen by cardiology in the past because of her breathing issues and some ongoing chest pain-- based on notes, it was felt to be related to emotional stressors.  Past Medical History:  Diagnosis Date  . Ankle fracture, right 2001  . Anxiety   . Arthritis   . Asthma   . Carpal tunnel syndrome    Bilateral  . Diabetes mellitus without complication (HCC)    Type II  . GERD (gastroesophageal reflux disease)   . Hypertension   . Migraine   . OSA (obstructive sleep apnea)   . Plantar fasciitis of right foot   . Pneumonia    hx  . Shingles  11/2015  . Shortness of breath dyspnea    with walking short distances    Patient Active Problem List   Diagnosis Date Noted  . Preoperative clearance 08/12/2016  . Chest pain 04/07/2016  . LVH (left ventricular hypertrophy) 04/07/2016  . Hepatic steatosis 04/07/2016  . Diabetes mellitus with complication (HCC)   . Moderate persistent asthma 12/09/2015  . OSA on CPAP 10/23/2015  . Pain of right heel 08/01/2015  . Chronic pain   . Anemia 03/24/2014  . Diabetes mellitus type 2, controlled, without complications (HCC) 03/24/2014  . Asthma exacerbation 03/24/2014  . Hypertension 02/28/2014  . Morbid obesity (HCC) 02/28/2014  . DOE (dyspnea on exertion) 02/28/2014  . Dyspnea and respiratory abnormality 02/14/2014    Past Surgical History:  Procedure Laterality Date  . CARPAL TUNNEL RELEASE Left   . HEEL SPUR RESECTION Right 08/01/2015   Procedure: HEEL SPUR EXCISIONS;  Surgeon: Marcene Corning, MD;  Location: [facility] OR;  Service: Orthopedics;  Laterality: Right;  . HEEL SPUR RESECTION Left 01/27/2016   Procedure: LEFT HEEL SPUR EXCISION AND TENDON ACHILLIES REPAIR;  Surgeon: Marcene Corning, MD;  Location: [facility] OR;  Service: Orthopedics;  Laterality: Left;  PRONE POSITION  . TUBAL LIGATION      OB History    No data available       Home Medications    Prior to Admission medications  Medication Sig Start Date End Date Taking? Authorizing Provider  acetaminophen (TYLENOL) 500 MG tablet Take 2 tablets (1,000 mg total) by mouth every 6 (six) hours as needed. Patient taking differently: Take 1,000 mg by mouth every 6 (six) hours as needed for moderate pain or headache.  05/29/15   Sam, Ace Gins, PA-C  albuterol (PROVENTIL) (2.5 MG/3ML) 0.083% nebulizer solution Take 2.5 mg by nebulization 2 (two) times daily.    [provider]  amLODipine (NORVASC) 5 MG tablet Take 1 tablet (5 mg total) by mouth daily. Patient taking differently: Take 10 mg by mouth daily.  03/19/16   [name],  PA  BREO ELLIPTA 200-25 MCG/INH AEPB INHALE 1 PUFF INTO LUNGS DAILY 03/30/16   Kalman Shan, MD  chlorthalidone (HYGROTON) 25 MG tablet Take 25 mg by mouth every morning.    [provider]  clonazePAM (KLONOPIN) 1 MG tablet Take 1 mg by mouth 3 (three) times daily as needed for anxiety.    [provider]  cyclobenzaprine (FLEXERIL) 10 MG tablet Take 1 tablet (10 mg total) by mouth 2 (two) times daily as needed for muscle spasms. 01/22/16   Madolyn Frieze, MD  liraglutide (VICTOZA) 18 MG/3ML SOPN Inject 1.8 mg into the skin daily.    [provider]  pregabalin (LYRICA) 75 MG capsule Take 75 mg by mouth 2 (two) times daily.    [provider]  PROVENTIL HFA 108 (90 Base) MCG/ACT inhaler INHALE 2 PUFFS INTO THE LUNGS EVERY 6 HOURS AS NEEDED FOR WHEEZING OR SHORTNESS OF BREATH 09/30/16   Kalman Shan, MD  TOUJEO SOLOSTAR 300 UNIT/ML SOPN INJ 45 UNITS [place] ONCE D 12/20/15   [provider]  valsartan (DIOVAN) 320 MG tablet Take 320 mg by mouth at bedtime.    [provider]    Family History Family History  Problem Relation Age of Onset  . Hypertension Mother   . Pulmonary embolism Mother   . Breast cancer Maternal Aunt   . Brain cancer Cousin   . CAD Maternal Grandmother   . Microcephaly Maternal Grandmother   . Heart attack Maternal Grandmother     Social History Social History  Substance Use Topics  . Smoking status: Never Smoker  . Smokeless tobacco: Never Used     Comment: years ago may have smoked 1 cig or less a week  . Alcohol use No     Allergies   Aspirin; Nsaids; Penicillins; Tolmetin; Oxycodone-acetaminophen; Percocet [oxycodone-acetaminophen]; Vicodin [hydrocodone-acetaminophen]; and Tramadol   Review of Systems Review of Systems  Respiratory: Positive for cough, chest tightness and shortness of breath.   Cardiovascular: Positive for chest pain.  All other systems reviewed and are negative.    Physical  Exam Updated Vital Signs BP (!) 151/95 (BP Location: Right Arm)   Pulse 62   Temp 97.7 F (36.5 C) (Oral)   Resp (!) 22   Ht  (1.676 m)   Wt (!) 180.5 kg (398 lb)   SpO2 100%   BMI 64.24 kg/m   Physical Exam  Constitutional: She is oriented to person, place, and time. She appears well-developed and well-nourished.  Morbidly obese, NAD  HENT:  Head: Normocephalic and atraumatic.  Mouth/Throat: Oropharynx is clear and moist.  Eyes: Pupils are equal, round, and reactive to light. Conjunctivae and EOM are normal.  Neck: Normal range of motion.  Cardiovascular: Normal rate, regular rhythm and normal heart sounds.   Pulmonary/Chest: Effort normal. No respiratory distress. She has no wheezes. She  has no rhonchi.  No significant labored breathing noted, able to speak in full sentences, lung sounds are diminished but no audible wheezes or rhonchi  Abdominal: Soft. Bowel sounds are normal. There is no tenderness. There is no rebound.  Musculoskeletal: Normal range of motion.  Neurological: She is alert and oriented to person, place, and time.  Skin: Skin is warm and dry.  Psychiatric: She has a normal mood and affect.  Nursing note and vitals reviewed.    ED Treatments / Results  Labs (all labs ordered are listed, but only abnormal results are displayed) Labs Reviewed  BASIC METABOLIC PANEL - Abnormal; Notable for the following:       Result Value   Glucose, Bld 106 (*)    Creatinine, Ser 1.21 (*)    Calcium 8.5 (*)    GFR calc non Af Amer 52 (*)    All other components within normal limits  CBC - Abnormal; Notable for the following:    Hemoglobin 11.8 (*)    RDW 15.7 (*)    All other components within normal limits  I-STAT TROPONIN, ED    EKG  EKG Interpretation  Date/Time:  Saturday October 09 2016 02:20:52 EDT Ventricular Rate:  63 PR Interval:  146 QRS Duration: 86 QT Interval:  416 QTC Calculation: 425 R Axis:   52 Text Interpretation:  Normal sinus  rhythm Normal ECG Confirmed by Rochele Raring [phone]) on 10/09/2016 4:56:24 AM       Radiology Dg Chest 2 View  Result Date: 10/09/2016 CLINICAL DATA:  Shortness of breath and chest tightness EXAM: CHEST  2 VIEW COMPARISON:  04/07/2016 FINDINGS: Low lung volumes. No acute consolidation or pleural effusion. Borderline cardiomegaly. No pneumothorax. IMPRESSION: Low lung volumes.  No acute infiltrate or edema Electronically Signed   By: Jasmine Pang M.D.   On: 10/09/2016 03:57    Procedures Procedures (including critical care time)  Medications Ordered in ED Medications  albuterol (PROVENTIL) (2.5 MG/3ML) 0.083% nebulizer solution 5 mg (5 mg Nebulization Given 10/09/16 0227)  albuterol (PROVENTIL) (2.5 MG/3ML) 0.083% nebulizer solution 5 mg (5 mg Nebulization Given 10/09/16 0420)  ipratropium (ATROVENT) nebulizer solution 0.5 mg (0.5 mg Nebulization Given 10/09/16 0420)  methylPREDNISolone sodium succinate (SOLU-MEDROL) 125 mg/2 mL injection 125 mg (125 mg Intramuscular Given 10/09/16 0420)     Initial Impression / Assessment and Plan / ED Course  I have reviewed the triage vital signs and the nursing notes.  Pertinent labs & imaging results that were available during my care of the patient were reviewed by me and considered in my medical decision making (see chart for details).  46 year old female here with shortness of breath. Has history of sleep apnea, was not wearing her CPAP this morning at onset of symptoms.  She states the strap pinches her neck so she tries to wear it but often takes it off.  Patient is afebrile and nontoxic. On exam she does have diminished breath sounds but is not in any acute distress. Her vitals are stable. I do not appreciate any wheezes or rhonchi.  Does not appear clinically fluid overloaded.  Screening labs obtained from triage are overall reassuring. Chest x-ray is clear. EKG nonischemic. Patient reports some improvement with neb given in triage. Will order  burst dose of Solu-Medrol as well as second albuterol/Atrovent neb and reassess.  Patient reassessed after Solu-Medrol and second, states she is feeling significantly better.  Vital signs remain stable.  Lung sounds have improved, better aeration throughout.  At  this point given her stable than resolution of symptoms, I do feel she is stable for discharge. I do feel that her not wearing her CPAP may contributed to her symptoms. Recommended that she talk with her doctor about getting a new strap if that is the problem with her not wearing it at night.  Can follow-up with PCP. Return precautions discussed. Patient was discharged home in stable condition.  Final Clinical Impressions(s) / ED Diagnoses   Final diagnoses:  Shortness of breath    New Prescriptions New Prescriptions   No medications on file     Garlon Hatchet, PA-C 10/09/16 1610    Ward, Layla Maw, DO 10/09/16 [phone]

## 2016-11-17 ENCOUNTER — Other Ambulatory Visit: Payer: Self-pay | Admitting: Internal Medicine

## 2017-01-28 ENCOUNTER — Other Ambulatory Visit: Payer: Self-pay | Admitting: Internal Medicine

## 2017-02-21 ENCOUNTER — Emergency Department (HOSPITAL_COMMUNITY)
Admission: EM | Admit: 2017-02-21 | Discharge: 2017-02-21 | Disposition: A | Discharge status: Home / Self Care | Payer: Medicaid Other

## 2017-02-21 ENCOUNTER — Other Ambulatory Visit: Payer: Self-pay

## 2017-02-23 ENCOUNTER — Other Ambulatory Visit: Payer: Self-pay | Admitting: Cardiovascular Disease

## 2017-02-24 NOTE — Telephone Encounter (Signed)
Rx has been sent to the pharmacy electronically. ° °

## 2017-03-10 DIAGNOSIS — K219 Gastro-esophageal reflux disease without esophagitis: Secondary | ICD-10-CM | POA: Insufficient documentation

## 2017-03-17 ENCOUNTER — Other Ambulatory Visit: Payer: Self-pay | Admitting: Internal Medicine

## 2017-03-18 ENCOUNTER — Other Ambulatory Visit: Payer: Self-pay | Admitting: Acute Care

## 2017-03-24 ENCOUNTER — Encounter (HOSPITAL_COMMUNITY): Payer: Self-pay | Admitting: Emergency Medicine

## 2017-03-24 ENCOUNTER — Other Ambulatory Visit: Payer: Self-pay

## 2017-03-24 DIAGNOSIS — M19071 Primary osteoarthritis, right ankle and foot: Secondary | ICD-10-CM | POA: Diagnosis not present

## 2017-03-24 DIAGNOSIS — E119 Type 2 diabetes mellitus without complications: Secondary | ICD-10-CM | POA: Diagnosis not present

## 2017-03-24 DIAGNOSIS — I10 Essential (primary) hypertension: Secondary | ICD-10-CM | POA: Insufficient documentation

## 2017-03-24 DIAGNOSIS — M170 Bilateral primary osteoarthritis of knee: Secondary | ICD-10-CM | POA: Diagnosis not present

## 2017-03-24 DIAGNOSIS — Z79899 Other long term (current) drug therapy: Secondary | ICD-10-CM | POA: Diagnosis not present

## 2017-03-24 DIAGNOSIS — M19072 Primary osteoarthritis, left ankle and foot: Secondary | ICD-10-CM | POA: Insufficient documentation

## 2017-03-24 DIAGNOSIS — M7918 Myalgia, other site: Secondary | ICD-10-CM | POA: Diagnosis present

## 2017-03-24 NOTE — ED Triage Notes (Signed)
Pt is c/o shortness of breath and pain in her joints and back  Pt states she feels tired and earlier today her blood sugar dropped down to 44  Pt is also c/o burning in her feet

## 2017-03-25 ENCOUNTER — Emergency Department (HOSPITAL_COMMUNITY)
Admission: EM | Admit: 2017-03-25 | Discharge: 2017-03-25 | Disposition: A | Discharge status: Home / Self Care | Payer: Medicaid Other | Attending: Emergency Medicine | Admitting: Emergency Medicine

## 2017-03-25 DIAGNOSIS — M159 Polyosteoarthritis, unspecified: Secondary | ICD-10-CM

## 2017-03-25 LAB — CBG MONITORING, ED: GLUCOSE-CAPILLARY: 149 mg/dL — AB (ref 65–99)

## 2017-03-25 NOTE — ED Provider Notes (Signed)
[facility]-EMERGENCY DEPT Provider Note   CSN: [id number] Arrival date & time: 03/24/17  2041     History   Chief Complaint Chief Complaint  Patient presents with  . Generalized Body Aches  . Shortness of Breath    HPI Melanie Hess is a 47 y.o. female.  47 year old female with a history of hypertension, diabetes, esophageal reflux, and ankle fracture status post surgery to bilateral ankles 1-2 years ago presents to the emergency department for worsening knee and ankle pain.  Patient states that pain has been sharp.  It is worse in her right ankle, but will spontaneously radiate from her knees down to her distal lower extremities.  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  She has been compliant with her Lyrica as well as her daily meloxicam without improvement to her discomfort.  She has not had any new trauma, injury, fall, fevers.  She is followed by orthopedics, but does not have an appointment for follow-up until the end of the month.  Patient also a secondary complaint of shortness of breath.  She has a history of asthma and notes that this feels similar.  She has been managing with her albuterol inhaler with mild improvement.  She is supposed to be on daily Brio Ellipta, but has missed a few doses of this recently.  No increased cough.  She has some tightness to her bilateral lower chest, but no complaints of chest pain.   The history is provided by the patient. No language interpreter was used.    Past Medical History:  Diagnosis Date  . Ankle fracture, right 2001  . Anxiety   . Arthritis   . Asthma   . Carpal tunnel syndrome    Bilateral  . Diabetes mellitus without complication (HCC)    Type II  . GERD (gastroesophageal reflux disease)   . Hypertension   . Migraine   . OSA (obstructive sleep apnea)   . Plantar fasciitis of right foot   . Pneumonia    hx  . Shingles 11/2015    . Shortness of breath dyspnea    with walking short distances    Patient Active Problem List   Diagnosis Date Noted  . Preoperative clearance 08/12/2016  . Chest pain 04/07/2016  . LVH (left ventricular hypertrophy) 04/07/2016  . Hepatic steatosis 04/07/2016  . Diabetes mellitus with complication (HCC)   . Moderate persistent asthma 12/09/2015  . OSA on CPAP 10/23/2015  . Pain of right heel 08/01/2015  . Chronic pain   . Anemia 03/24/2014  . Diabetes mellitus type 2, controlled, without complications (HCC) 03/24/2014  . Asthma exacerbation 03/24/2014  . Hypertension 02/28/2014  . Morbid obesity (HCC) 02/28/2014  . DOE (dyspnea on exertion) 02/28/2014  . Dyspnea and respiratory abnormality 02/14/2014    Past Surgical History:  Procedure Laterality Date  . CARPAL TUNNEL RELEASE Left   . HEEL SPUR RESECTION Right 08/01/2015   Procedure: HEEL SPUR EXCISIONS;  Surgeon: Marcene Corning, MD;  Location: [facility] OR;  Service: Orthopedics;  Laterality: Right;  . HEEL SPUR RESECTION Left 01/27/2016   Procedure: LEFT HEEL SPUR EXCISION AND TENDON ACHILLIES REPAIR;  Surgeon: Marcene Corning, MD;  Location: [facility] OR;  Service: Orthopedics;  Laterality: Left;  PRONE POSITION  . TUBAL LIGATION      OB History    No data available       Home Medications    Prior  to Admission medications   Medication Sig Start Date End Date Taking? Authorizing Provider  acetaminophen (TYLENOL) 500 MG tablet Take 2 tablets (1,000 mg total) by mouth every 6 (six) hours as needed. 05/29/15   Sam, Ace Gins, PA-C  albuterol (PROVENTIL) (2.5 MG/3ML) 0.083% nebulizer solution Take 2.5 mg by nebulization 2 (two) times daily.    [provider]  amLODipine (NORVASC) 5 MG tablet Take 1 tablet (5 mg total) by mouth daily. Patient taking differently: Take 10 mg by mouth daily.  03/19/16   Meng, Wynema Birch, PA  BREO ELLIPTA 100-25 MCG/INH AEPB INHALE 1 PUFF INTO THE LUNGS DAILY 11/18/16   Kalman Shan, MD  BREO ELLIPTA  200-25 MCG/INH AEPB INHALE 1 PUFF INTO LUNGS DAILY Patient taking differently: INHALE 2 PUFFS INTO LUNGS DAILY 03/30/16   Kalman Shan, MD  chlorthalidone (HYGROTON) 25 MG tablet Take 25 mg by mouth every morning.    [provider]  clonazePAM (KLONOPIN) 1 MG tablet Take 1 mg by mouth 3 (three) times daily as needed for anxiety.    [provider]  cyclobenzaprine (FLEXERIL) 10 MG tablet Take 1 tablet (10 mg total) by mouth 2 (two) times daily as needed for muscle spasms. Patient not taking: Reported on 10/09/2016 01/22/16   Madolyn Frieze, MD  liraglutide (VICTOZA) 18 MG/3ML SOPN Inject 1.8 mg into the skin daily.    [provider]  lisinopril (PRINIVIL,ZESTRIL) 40 MG tablet TAKE 1 TABLET(40 MG) BY MOUTH DAILY 01/28/17   Hilty, Lisette Abu, MD  pregabalin (LYRICA) 75 MG capsule Take 75 mg by mouth 2 (two) times daily.    [provider]  PROVENTIL HFA 108 (90 Base) MCG/ACT inhaler INHALE 2 PUFFS INTO THE LUNGS EVERY 6 HOURS AS NEEDED FOR WHEEZING OR SHORTNESS OF BREATH 09/30/16   Kalman Shan, MD  PROVENTIL HFA 108 (90 Base) MCG/ACT inhaler INHALE 2 PUFFS INTO THE LUNGS EVERY 6 HOURS AS NEEDED FOR WHEEZING OR SHORTNESS OF BREATH 03/17/17   Bevelyn Ngo, NP  TOUJEO SOLOSTAR 300 UNIT/ML SOPN USE 45 UNITS SUBCUTANEOUSLY DAILY 12/20/15   [provider]  valsartan (DIOVAN) 160 MG tablet TAKE 1 TABLET(160 MG) BY MOUTH DAILY 01/28/17   Hilty, Lisette Abu, MD  valsartan (DIOVAN) 320 MG tablet Take 320 mg by mouth at bedtime.    [provider]  valsartan (DIOVAN) 320 MG tablet TAKE 1 TABLET(320 MG) BY MOUTH DAILY 02/24/17   Lennette Bihari, MD    Family History Family History  Problem Relation Age of Onset  . Hypertension Mother   . Pulmonary embolism Mother   . Breast cancer Maternal Aunt   . Brain cancer Cousin   . CAD Maternal Grandmother   . Microcephaly Maternal Grandmother   . Heart attack Maternal Grandmother     Social History Social  History   Tobacco Use  . Smoking status: Never Smoker  . Smokeless tobacco: Never Used  . Tobacco comment: years ago may have smoked 1 cig or less a week  Substance Use Topics  . Alcohol use: No    Alcohol/week: 0.0 oz  . Drug use: No     Allergies   Aspirin; Nsaids; Penicillins; Tolmetin; Oxycodone-acetaminophen; Percocet [oxycodone-acetaminophen]; Vicodin [hydrocodone-acetaminophen]; and Tramadol   Review of Systems Review of Systems Ten systems reviewed and are negative for acute change, except as noted in the HPI.    Physical Exam Updated Vital Signs BP (!) 141/89 (BP Location: Left Arm)   Pulse (!) 54   Temp 98.3  F (36.8 C) (Oral)   Resp 20   Ht 5\' 5"  (1.651 m)   Wt (!) 165.6 kg (365 lb)   SpO2 99%   BMI 60.74 kg/m   Physical Exam  Constitutional: She is oriented to person, place, and time. She appears well-developed and well-nourished. No distress.  Nontoxic appearing and in NAD. Morbidly obese.  HENT:  Head: Normocephalic and atraumatic.  Eyes: Conjunctivae and EOM are normal. No scleral icterus.  Neck: Normal range of motion.  Cardiovascular: Normal rate, regular rhythm and intact distal pulses.  Pulmonary/Chest: Effort normal. No stridor. No respiratory distress. She has no wheezes. She has no rales.  Respirations even and unlabored. Lungs CTAB.  Musculoskeletal: Normal range of motion.  Normal ROM of bilateral lower extremities; specifically normal range of motion of bilateral ankles.  No associated induration, erythema.  Mild tenderness to palpation to the medial posterior aspect of the right ankle.  No bony deformity or crepitus.  Neurological: She is alert and oriented to person, place, and time. She exhibits normal muscle tone. Coordination normal.  Sensation to light touch intact in BLE  Skin: Skin is warm and dry. No rash noted. She is not diaphoretic. No erythema. No pallor.  Ambulatory with steady gait  Psychiatric: She has a normal mood and  affect. Her behavior is normal.  Nursing note and vitals reviewed.    ED Treatments / Results  Labs (all labs ordered are listed, but only abnormal results are displayed) Labs Reviewed  CBG MONITORING, ED - Abnormal; Notable for the following components:      Result Value   Glucose-Capillary 149 (*)    All other components within normal limits    EKG  EKG Interpretation None       Radiology No results found.  Procedures Procedures (including critical care time)  Medications Ordered in ED Medications - No data to display   Initial Impression / Assessment and Plan / ED Course  I have reviewed the triage vital signs and the nursing notes.  Pertinent labs & imaging results that were available during my care of the patient were reviewed by me and considered in my medical decision making (see chart for details).     47 year old female presents to the emergency department with primary complaint of pain associated with known osteoarthritis.  History of surgery to bilateral ankles.  Patient is neurovascularly intact today.  No focal exam findings concerning for septic joint.  She is currently maximized on NSAIDs as well as Lyrica.  Bracing applied to bilateral ankles for stability to try and improve discomfort.  She is unable to take many medications, limiting my ability to manage her current discomfort.  I have advised topical Salonpas or capsaicin cream.  She has scheduled follow-up with her orthopedist at the end of the month.  Patient also with complaints of shortness of breath which feels similar to her asthma.  She has been self managing with her albuterol inhaler.  She does report noncompliance with her Brio Ellipta.  Patient with clear lung sounds to auscultation on my assessment.  She has not had any hypoxia.  No fevers to suggest infectious etiology.  I am hesitant to place the patient on an additional course of oral steroids given her diabetic history.  I have encouraged  her to remain compliant with her current asthma regimen.  She is to follow-up with her primary care doctor for recheck.  Return precautions discussed and provided. Patient discharged in stable condition with no unaddressed  concerns.   Final Clinical Impressions(s) / ED Diagnoses   Final diagnoses:  Osteoarthritis of multiple joints, unspecified osteoarthritis type    ED Discharge Orders    None       Antony Madura, PA-C 03/25/17 0347    Paula Libra, MD 03/25/17 [phone]

## 2017-03-25 NOTE — Discharge Instructions (Signed)
Continue with Meloxicam and Lyrica as prescribed by your doctors. We recommend the use of Topical Salonpas or Capsaicin cream, massaged into your joints as indicated for use on the bottle. Keep your ankle wrapped for stability. Continue with icing 3-4 times per day and elevation. Follow up with your primary care doctor and Orthopedist. You may return for new or concerning symptoms.

## 2017-05-11 ENCOUNTER — Other Ambulatory Visit: Payer: Self-pay | Admitting: Surgical Oncology

## 2017-05-18 ENCOUNTER — Other Ambulatory Visit: Payer: Self-pay | Admitting: Surgical Oncology

## 2017-05-18 DIAGNOSIS — K224 Dyskinesia of esophagus: Secondary | ICD-10-CM

## 2017-05-23 ENCOUNTER — Ambulatory Visit
Admission: RE | Admit: 2017-05-23 | Discharge: 2017-05-23 | Disposition: A | Discharge status: Home / Self Care | Payer: Medicaid Other | Source: Ambulatory Visit | Attending: Surgical Oncology | Admitting: Surgical Oncology

## 2017-05-23 DIAGNOSIS — K224 Dyskinesia of esophagus: Secondary | ICD-10-CM

## 2017-06-04 ENCOUNTER — Other Ambulatory Visit: Payer: Self-pay | Admitting: Cardiovascular Disease

## 2017-06-16 ENCOUNTER — Ambulatory Visit (INDEPENDENT_AMBULATORY_CARE_PROVIDER_SITE_OTHER): Payer: Medicaid Other | Admitting: Psychiatry

## 2017-06-16 DIAGNOSIS — F3160 Bipolar disorder, current episode mixed, unspecified: Secondary | ICD-10-CM
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
## 2017-06-18 ENCOUNTER — Other Ambulatory Visit: Payer: Self-pay | Admitting: Cardiovascular Disease

## 2017-06-20 NOTE — Telephone Encounter (Signed)
REFILL 

## 2017-06-22 ENCOUNTER — Encounter (HOSPITAL_COMMUNITY): Payer: Self-pay | Admitting: Psychiatry

## 2017-06-22 ENCOUNTER — Ambulatory Visit (INDEPENDENT_AMBULATORY_CARE_PROVIDER_SITE_OTHER): Payer: Medicaid Other | Admitting: Psychiatry

## 2017-06-22 VITALS — BP 166/106 | HR 46 | Ht 66.0 in | Wt 353.0 lb

## 2017-06-22 DIAGNOSIS — G8929 Other chronic pain: Secondary | ICD-10-CM | POA: Diagnosis not present

## 2017-06-22 DIAGNOSIS — F4310 Post-traumatic stress disorder, unspecified: Secondary | ICD-10-CM | POA: Diagnosis not present

## 2017-06-22 DIAGNOSIS — Z91411 Personal history of adult psychological abuse: Secondary | ICD-10-CM

## 2017-06-22 DIAGNOSIS — G43909 Migraine, unspecified, not intractable, without status migrainosus: Secondary | ICD-10-CM | POA: Diagnosis not present

## 2017-06-22 DIAGNOSIS — G4730 Sleep apnea, unspecified: Secondary | ICD-10-CM

## 2017-06-22 DIAGNOSIS — J45909 Unspecified asthma, uncomplicated: Secondary | ICD-10-CM

## 2017-06-22 DIAGNOSIS — K219 Gastro-esophageal reflux disease without esophagitis: Secondary | ICD-10-CM | POA: Diagnosis not present

## 2017-06-22 DIAGNOSIS — F329 Major depressive disorder, single episode, unspecified: Secondary | ICD-10-CM

## 2017-06-22 DIAGNOSIS — F419 Anxiety disorder, unspecified: Secondary | ICD-10-CM | POA: Diagnosis not present

## 2017-06-22 DIAGNOSIS — D649 Anemia, unspecified: Secondary | ICD-10-CM

## 2017-06-22 DIAGNOSIS — R450 Nervousness: Secondary | ICD-10-CM

## 2017-06-22 DIAGNOSIS — G4700 Insomnia, unspecified: Secondary | ICD-10-CM

## 2017-06-22 DIAGNOSIS — Z598 Other problems related to housing and economic circumstances: Secondary | ICD-10-CM

## 2017-06-22 DIAGNOSIS — I10 Essential (primary) hypertension: Secondary | ICD-10-CM | POA: Diagnosis not present

## 2017-06-22 DIAGNOSIS — Z634 Disappearance and death of family member: Secondary | ICD-10-CM

## 2017-06-22 DIAGNOSIS — E119 Type 2 diabetes mellitus without complications: Secondary | ICD-10-CM

## 2017-06-22 MED ORDER — ESCITALOPRAM OXALATE 10 MG PO TABS: ORAL_TABLET | ORAL | 0 refills | Status: DC
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
## 2017-06-24 ENCOUNTER — Encounter (HOSPITAL_COMMUNITY): Payer: Self-pay | Admitting: *Deleted

## 2017-06-24 ENCOUNTER — Emergency Department (HOSPITAL_COMMUNITY)
Admission: EM | Admit: 2017-06-24 | Discharge: 2017-06-24 | Disposition: A | Discharge status: Home / Self Care | Payer: Medicaid Other | Attending: Emergency Medicine | Admitting: Emergency Medicine

## 2017-06-24 ENCOUNTER — Emergency Department (HOSPITAL_COMMUNITY): Payer: Medicaid Other

## 2017-06-24 ENCOUNTER — Other Ambulatory Visit: Payer: Self-pay

## 2017-06-24 DIAGNOSIS — E119 Type 2 diabetes mellitus without complications: Secondary | ICD-10-CM | POA: Diagnosis not present

## 2017-06-24 DIAGNOSIS — I10 Essential (primary) hypertension: Secondary | ICD-10-CM | POA: Diagnosis not present

## 2017-06-24 DIAGNOSIS — J45909 Unspecified asthma, uncomplicated: Secondary | ICD-10-CM | POA: Insufficient documentation

## 2017-06-24 DIAGNOSIS — Z79899 Other long term (current) drug therapy: Secondary | ICD-10-CM | POA: Insufficient documentation

## 2017-06-24 DIAGNOSIS — R531 Weakness: Secondary | ICD-10-CM | POA: Diagnosis not present

## 2017-06-24 DIAGNOSIS — R42 Dizziness and giddiness: Secondary | ICD-10-CM | POA: Diagnosis present

## 2017-06-24 LAB — DIFFERENTIAL
Abs Immature Granulocytes: 0 10*3/uL (ref 0.0–0.1)
Basophils Absolute: 0 10*3/uL (ref 0.0–0.1)
Basophils Relative: 1 %
EOS ABS: 0.3 10*3/uL (ref 0.0–0.7)
Eosinophils Relative: 5 %
Immature Granulocytes: 0 %
LYMPHS ABS: 1.9 10*3/uL (ref 0.7–4.0)
Lymphocytes Relative: 37 %
MONOS PCT: 6 %
Monocytes Absolute: 0.3 10*3/uL (ref 0.1–1.0)
Neutro Abs: 2.7 10*3/uL (ref 1.7–7.7)
Neutrophils Relative %: 51 %

## 2017-06-24 LAB — CBC
HEMATOCRIT: 40.2 % (ref 36.0–46.0)
Hemoglobin: 12.7 g/dL (ref 12.0–15.0)
MCH: 27.5 pg (ref 26.0–34.0)
MCHC: 31.6 g/dL (ref 30.0–36.0)
MCV: 87.2 fL (ref 78.0–100.0)
Platelets: 258 10*3/uL (ref 150–400)
RBC: 4.61 MIL/uL (ref 3.87–5.11)
RDW: 16.3 % — AB (ref 11.5–15.5)
WBC: 5.2 10*3/uL (ref 4.0–10.5)

## 2017-06-24 LAB — I-STAT CHEM 8, ED
BUN: 22 mg/dL — ABNORMAL HIGH (ref 6–20)
Calcium, Ion: 1.16 mmol/L (ref 1.15–1.40)
Chloride: 106 mmol/L (ref 101–111)
Creatinine, Ser: 1 mg/dL (ref 0.44–1.00)
GLUCOSE: 79 mg/dL (ref 65–99)
HCT: 41 % (ref 36.0–46.0)
Hemoglobin: 13.9 g/dL (ref 12.0–15.0)
Potassium: 4.3 mmol/L (ref 3.5–5.1)
Sodium: 143 mmol/L (ref 135–145)
TCO2: 28 mmol/L (ref 22–32)

## 2017-06-24 LAB — COMPREHENSIVE METABOLIC PANEL
ALT: 20 U/L (ref 14–54)
AST: 18 U/L (ref 15–41)
Albumin: 3.5 g/dL (ref 3.5–5.0)
Alkaline Phosphatase: 67 U/L (ref 38–126)
Anion gap: 7 (ref 5–15)
BILIRUBIN TOTAL: 0.6 mg/dL (ref 0.3–1.2)
BUN: 16 mg/dL (ref 6–20)
CO2: 27 mmol/L (ref 22–32)
CREATININE: 1.14 mg/dL — AB (ref 0.44–1.00)
Calcium: 8.8 mg/dL — ABNORMAL LOW (ref 8.9–10.3)
Chloride: 109 mmol/L (ref 101–111)
GFR calc Af Amer: >60 mL/min (ref >60)
GFR, EST NON AFRICAN AMERICAN: 56 mL/min — AB (ref >60)
Glucose, Bld: 81 mg/dL (ref 65–99)
POTASSIUM: 4 mmol/L (ref 3.5–5.1)
Sodium: 143 mmol/L (ref 135–145)
TOTAL PROTEIN: 6.6 g/dL (ref 6.5–8.1)

## 2017-06-24 LAB — I-STAT TROPONIN, ED: TROPONIN I, POC: 0 ng/mL (ref 0.00–0.08)

## 2017-06-24 LAB — PROTIME-INR
INR: 1.03
Prothrombin Time: 13.4 seconds (ref 11.4–15.2)

## 2017-06-24 LAB — I-STAT BETA HCG BLOOD, ED (MC, WL, AP ONLY): I-stat hCG, quantitative: <5 m[IU]/mL (ref <5)

## 2017-06-24 LAB — APTT: APTT: 29 s (ref 24–36)

## 2017-06-24 MED ORDER — CHLORTHALIDONE 25 MG PO TABS
25.0000 mg | ORAL_TABLET | Freq: Every day | ORAL | Status: DC
  Administered 2017-06-24: 25 mg via ORAL
  Filled 2017-06-24 (×2): qty 1

## 2017-06-24 MED ORDER — IRBESARTAN 300 MG PO TABS
300.0000 mg | ORAL_TABLET | Freq: Every day | ORAL | Status: DC
  Administered 2017-06-24: 300 mg via ORAL
  Filled 2017-06-24 (×2): qty 1

## 2017-06-24 NOTE — ED Triage Notes (Signed)
Pt in c/o dizziness since she woke up this morning, states she is seeing "specks" in her vision, denies pain, alert and oriented, no weakness of arms or legs noted

## 2017-06-24 NOTE — ED Provider Notes (Addendum)
[facility] EMERGENCY DEPARTMENT Provider Note   CSN: [id number] Arrival date & time: 06/24/17  [id number]     History   Chief Complaint Chief Complaint  Patient presents with  . Dizziness    HPI [name] is a 47 y.o. female.  HPI Patient awakened this morning 5 AM feeling lightheaded.She also sees "specks" meaning flashes of lightto go on and off in her left eye since this morning. She gets similarflashes of lightwhen she gets migraine headaches. She denies chest pain denies blurred vision denies double vision. No shortness of breath. Nothing makes symptoms better or worse. No other associated symptoms. She did not take her medicationthis morning. She states her blood pressures been elevated for the past 2 days. No other associated symptoms.no treatment prior to coming here Past Medical History:  Diagnosis Date  . Ankle fracture, right 2001  . Anxiety   . Arthritis   . Asthma   . Carpal tunnel syndrome    Bilateral  . Diabetes mellitus without complication (HCC)    Type II  . GERD (gastroesophageal reflux disease)   . Hypertension   . Migraine   . OSA (obstructive sleep apnea)   . Plantar fasciitis of right foot   . Pneumonia    hx  . Shingles 11/2015  . Shortness of breath dyspnea    with walking short distances    Patient Active Problem List   Diagnosis Date Noted  . Esophageal reflux 03/10/2017  . Preoperative clearance 08/12/2016  . Chest pain 04/07/2016  . LVH (left ventricular hypertrophy) 04/07/2016  . Hepatic steatosis 04/07/2016  . Diabetes mellitus with complication (HCC)   . Moderate persistent asthma 12/09/2015  . OSA on CPAP 10/23/2015  . Pain of right heel 08/01/2015  . Chronic pain   . Anemia 03/24/2014  . Diabetes mellitus type 2, controlled, without complications (HCC) 03/24/2014  . Asthma exacerbation 03/24/2014  . Hypertension 02/28/2014  . Morbid obesity (HCC) 02/28/2014  . DOE (dyspnea on exertion) 02/28/2014    . Dyspnea and respiratory abnormality 02/14/2014    Past Surgical History:  Procedure Laterality Date  . CARPAL TUNNEL RELEASE Left   . HEEL SPUR RESECTION Right 08/01/2015   Procedure: HEEL SPUR EXCISIONS;  Surgeon: Marcene CorningPeter Dalldorf, MD;  [facility];  Service: Orthopedics;  Laterality: Right;  . HEEL SPUR RESECTION Left 01/27/2016   Procedure: LEFT HEEL SPUR EXCISION AND TENDON ACHILLIES REPAIR;  Surgeon: Marcene CorningPeter Dalldorf, MD;  Location: [facility] OR;  Service: Orthopedics;  Laterality: Left;  PRONE POSITION  . TUBAL LIGATION       OB History   None      Home Medications    Prior to Admission medications   Medication Sig Start Date End Date Taking? Authorizing Provider  albuterol (PROVENTIL) (2.5 MG/3ML) 0.083% nebulizer solution Take 2.5 mg by nebulization 2 (two) times daily.    [provider]  amLODipine (NORVASC) 5 MG tablet Take 1 tablet (5 mg total) by mouth daily. Patient taking differently: Take 10 mg by mouth daily.  03/19/16   Meng, Wynema BirchHao, PA  BREO ELLIPTA 200-25 MCG/INH AEPB INHALE 1 PUFF INTO LUNGS DAILY Patient taking differently: INHALE 2 PUFFS INTO LUNGS DAILY 03/30/16   Kalman Shanamaswamy, Murali, MD  chlorthalidone (HYGROTON) 25 MG tablet Take 25 mg by mouth every morning.    [provider]  cyclobenzaprine (FLEXERIL) 10 MG tablet Take 1 tablet (10 mg total) by mouth 2 (two) times daily as needed for muscle spasms. 01/22/16  Madolyn Frieze, MD  escitalopram (LEXAPRO) 10 MG tablet Take 1/2 tab daily for 1 week and than one tab daily 06/22/17   Arfeen, Phillips Grout, MD  liraglutide (VICTOZA) 18 MG/3ML SOPN Inject 1.8 mg into the skin daily.    [provider]  lisinopril (PRINIVIL,ZESTRIL) 40 MG tablet TAKE 1 TABLET(40 MG) BY MOUTH DAILY 01/28/17   Hilty, Lisette Abu, MD  pregabalin (LYRICA) 75 MG capsule Take 75 mg by mouth 2 (two) times daily.    [provider]  PROVENTIL HFA 108 (90 Base) MCG/ACT inhaler INHALE 2 PUFFS INTO THE LUNGS EVERY 6 HOURS AS NEEDED  FOR WHEEZING OR SHORTNESS OF BREATH 03/17/17   Bevelyn Ngo, NP  TOUJEO SOLOSTAR 300 UNIT/ML SOPN USE 45 UNITS SUBCUTANEOUSLY DAILY 12/20/15   [provider]  valsartan (DIOVAN) 320 MG tablet Take 320 mg by mouth at bedtime.    [provider]    Family History Family History  Problem Relation Age of Onset  . Hypertension Mother   . Pulmonary embolism Mother   . Depression Mother   . Breast cancer Maternal Aunt   . Brain cancer Cousin   . CAD Maternal Grandmother   . Microcephaly Maternal Grandmother   . Heart attack Maternal Grandmother     Social History Social History   Tobacco Use  . Smoking status: Never Smoker  . Smokeless tobacco: Never Used  . Tobacco comment: years ago may have smoked 1 cig or less a week  Substance Use Topics  . Alcohol use: No    Alcohol/week: 0.0 oz  . Drug use: No     Allergies   Aspirin; Nsaids; Penicillins; Tolmetin; Oxycodone-acetaminophen; Percocet [oxycodone-acetaminophen]; Vicodin [hydrocodone-acetaminophen]; and Tramadol   Review of Systems Review of Systems  Eyes: Positive for visual disturbance.  Allergic/Immunologic: Positive for immunocompromised state.       Diabetic  Neurological: Positive for light-headedness.  All other systems reviewed and are negative.    Physical Exam Updated Vital Signs BP (!) 185/93 (BP Location: Right Arm)   Pulse 60   Temp 98.5 F (36.9 C) (Oral)   Resp 16   SpO2 99%   Physical Exam  Constitutional: She is oriented to person, place, and time. She appears well-developed and well-nourished. No distress.  HENT:  Head: Normocephalic and atraumatic.  Eyes: Pupils are equal, round, and reactive to light. Conjunctivae and EOM are normal.  Optic discs sharp  Neck: Neck supple. No tracheal deviation present. No thyromegaly present.  Cardiovascular: Normal rate and regular rhythm.  No murmur heard. Pulmonary/Chest: Effort normal and breath sounds normal.  Abdominal: Soft.  Bowel sounds are normal. She exhibits no distension. There is no tenderness.  Morbidly obese  Musculoskeletal: Normal range of motion. She exhibits no edema or tenderness.  Neurological: She is alert and oriented to person, place, and time. No cranial nerve deficit. Coordination normal.  Gait normal Romberg normal pronator drift normal finger to nose normal DTR symmetric bilaterally at knee jerk ankle jerk and biceps toes downward going bilaterally  Skin: Skin is warm and dry. No rash noted.  Psychiatric: She has a normal mood and affect.  Nursing note and vitals reviewed.    ED Treatments / Results  Labs (all labs ordered are listed, but only abnormal results are displayed) Labs Reviewed  PROTIME-INR  APTT  CBC  DIFFERENTIAL  COMPREHENSIVE METABOLIC PANEL  I-STAT TROPONIN, ED  CBG MONITORING, ED  I-STAT CHEM 8, ED  I-STAT BETA HCG BLOOD, ED ([facility], [facility],  [facility] ONLY)   Results for orders placed or performed during the hospital encounter of 06/24/17  Protime-INR  Result Value Ref Range   Prothrombin Time 13.4 11.4 - 15.2 seconds   INR 1.03   APTT  Result Value Ref Range   aPTT 29 24 - 36 seconds  CBC  Result Value Ref Range   WBC 5.2 4.0 - 10.5 K/uL   RBC 4.61 3.87 - 5.11 MIL/uL   Hemoglobin 12.7 12.0 - 15.0 g/dL   HCT 16.1 09.6 - 04.5 %   MCV 87.2 78.0 - 100.0 fL   MCH 27.5 26.0 - 34.0 pg   MCHC 31.6 30.0 - 36.0 g/dL   RDW 40.9 (H) 81.1 - 91.4 %   Platelets 258 150 - 400 K/uL  Differential  Result Value Ref Range   Neutrophils Relative % 51 %   Neutro Abs 2.7 1.7 - 7.7 K/uL   Lymphocytes Relative 37 %   Lymphs Abs 1.9 0.7 - 4.0 K/uL   Monocytes Relative 6 %   Monocytes Absolute 0.3 0.1 - 1.0 K/uL   Eosinophils Relative 5 %   Eosinophils Absolute 0.3 0.0 - 0.7 K/uL   Basophils Relative 1 %   Basophils Absolute 0.0 0.0 - 0.1 K/uL   Immature Granulocytes 0 %   Abs Immature Granulocytes 0.0 0.0 - 0.1 K/uL  Comprehensive metabolic panel  Result Value Ref Range   Sodium  143 135 - 145 mmol/L   Potassium 4.0 3.5 - 5.1 mmol/L   Chloride 109 101 - 111 mmol/L   CO2 27 22 - 32 mmol/L   Glucose, Bld 81 65 - 99 mg/dL   BUN 16 6 - 20 mg/dL   Creatinine, Ser 7.82 (H) 0.44 - 1.00 mg/dL   Calcium 8.8 (L) 8.9 - 10.3 mg/dL   Total Protein 6.6 6.5 - 8.1 g/dL   Albumin 3.5 3.5 - 5.0 g/dL   AST 18 15 - 41 U/L   ALT 20 14 - 54 U/L   Alkaline Phosphatase 67 38 - 126 U/L   Total Bilirubin 0.6 0.3 - 1.2 mg/dL   GFR calc non Af Amer 56 (L) >60 mL/min   GFR calc Af Amer >60 >60 mL/min   Anion gap 7 5 - 15  I-stat troponin, ED  Result Value Ref Range   Troponin i, poc 0.00 0.00 - 0.08 ng/mL   Comment 3          I-Stat Chem 8, ED  Result Value Ref Range   Sodium 143 135 - 145 mmol/L   Potassium 4.3 3.5 - 5.1 mmol/L   Chloride 106 101 - 111 mmol/L   BUN 22 (H) 6 - 20 mg/dL   Creatinine, Ser 9.56 0.44 - 1.00 mg/dL   Glucose, Bld 79 65 - 99 mg/dL   Calcium, Ion 2.13 0.86 - 1.40 mmol/L   TCO2 28 22 - 32 mmol/L   Hemoglobin 13.9 12.0 - 15.0 g/dL   HCT 57.8 46.9 - 62.9 %  I-Stat beta hCG blood, ED  Result Value Ref Range   I-stat hCG, quantitative <5.0 <5 mIU/mL   Comment 3           Ct Head Wo Contrast  Result Date: 06/24/2017 CLINICAL DATA:  Dizziness and blurred vision left eye EXAM: CT HEAD WITHOUT CONTRAST TECHNIQUE: Contiguous axial images were obtained from the base of the skull through the vertex without intravenous contrast. COMPARISON:  April 24, 2009 FINDINGS: Brain: Ventricles are normal in size and configuration.  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 There is no mass, hemorrhage, extra-axial fluid collection, or midline shift. Gray-white compartments appear normal. No acute infarct evident. Vascular: No hyperdense vessel.  No vascular calcification evident. Skull: Bony calvarium appears intact. Sinuses/Orbits: There is mucosal thickening in several ethmoid air cells. Other visualized paranasal sinuses are clear.  Orbits appear symmetric bilaterally. Other: Mastoid air cells are clear. IMPRESSION: Invagination of CSF into the sella, a finding also present on 2011 examination. The ventricles are normal in size and configuration. No mass or hemorrhage. Gray-white compartments appear normal. There is mucosal thickening in several ethmoid air cells. Electronically Signed   By: Bretta Bang III M.D.   On: 06/24/2017 08:21   EKG None  EKG Interpretation  Date/Time:  Friday June 24 2017 07:37:36 EDT Ventricular Rate:  58 PR Interval:  148 QRS Duration: 86 QT Interval:  462 QTC Calculation: 453 R Axis:   39 Text Interpretation:  Sinus bradycardia Otherwise normal ECG Since last tracing rate slower Confirmed by Doug Sou [phone]) on 06/24/2017 8:28:20 AM      Radiology No results found.  Procedures Procedures (including critical care time)  Medications Ordered in ED Medications - No data to display   Initial Impression / Assessment and Plan / ED Course  I have reviewed the triage vital signs and the nursing notes.  Pertinent labs & imaging results that were available during my care of the patient were reviewed by me and considered in my medical decision making (see chart for details).     11:50 AM patient asymptomatic. Scotomata which patient describes has resolved. Vision normal. There is no evidence of end organ damage. She was treated with her usual morningblood pressure medications planfollow-up with Dr. Pecola Leisure one week for blood pressure recheck.  Lab work consistent with mild renal insufficiency which is chronic Final Clinical Impressions(s) / ED Diagnoses  dx #1 generalized weakness #2 elevated blood pressure #3 scotomata Final diagnoses:  None  #4 chronic renal insufficiency  ED Discharge Orders    None       Doug Sou, MD 06/24/17 1155    Doug Sou, MD 06/24/17 1156

## 2017-06-24 NOTE — Discharge Instructions (Addendum)
Take your medications as prescribed. Call Dr.Reese's office today to schedule point for one week from now. Tell office staff that you were seen here today. Your blood pressure should be rechecked at Dr.Reese's office in a week. Return if concern for any reason

## 2017-06-24 NOTE — ED Notes (Signed)
States her head feels better, not seeing spots or "specks" as she called it in her eyes.

## 2017-06-30 ENCOUNTER — Ambulatory Visit (INDEPENDENT_AMBULATORY_CARE_PROVIDER_SITE_OTHER): Payer: Medicaid Other | Admitting: Psychiatry

## 2017-06-30 DIAGNOSIS — F4310 Post-traumatic stress disorder, unspecified: Secondary | ICD-10-CM

## 2017-06-30 DIAGNOSIS — F329 Major depressive disorder, single episode, unspecified: Secondary | ICD-10-CM | POA: Diagnosis not present

## 2017-06-30 DIAGNOSIS — F3160 Bipolar disorder, current episode mixed, unspecified: Secondary | ICD-10-CM

## 2017-06-30 DIAGNOSIS — Z634 Disappearance and death of family member: Secondary | ICD-10-CM
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
## 2017-07-06 ENCOUNTER — Other Ambulatory Visit: Payer: Self-pay | Admitting: Internal Medicine

## 2017-07-06 ENCOUNTER — Other Ambulatory Visit: Payer: Self-pay | Admitting: Physician Assistant

## 2017-07-19 ENCOUNTER — Ambulatory Visit (HOSPITAL_COMMUNITY): Payer: Medicaid Other | Admitting: Psychiatry

## 2017-07-25 ENCOUNTER — Ambulatory Visit (INDEPENDENT_AMBULATORY_CARE_PROVIDER_SITE_OTHER): Payer: Medicaid Other | Admitting: Psychiatry

## 2017-07-25 DIAGNOSIS — F4310 Post-traumatic stress disorder, unspecified: Secondary | ICD-10-CM

## 2017-07-26 NOTE — Progress Notes (Signed)
   THERAPIST PROGRESS NOTE  Session Time: 1:05-2:00  Participation Level: Active  Behavioral Response: CasualAlertEuthymic  Type of Therapy: Individual Therapy  Treatment Goals addressed: Coping  Interventions: CBT  Summary: Melanie Hess is a 47 y.o. female who presents with MDD.   Suicidal/Homicidal: Nowithout intent/plan  Therapist Response: Pt. Continues to present as talkative, engaged in the therapy process. Pt. Discussed that she is actively preparing for the gastric sleeve surgery and has been compliant with the presurgery diet. Pt. Discussed success with setting healthier boundaries with her daughter and communicating that she did not want to spend as much time keeping her grandchildren because she needed to focus on her self-care. Pt. Indicated that her daughter received her new boundary and did not respond by withholding the children as she expected. Pt. Stated that she followed through with her homework of writing down the things that she hoped to achieve post-surgery. Pt. Stated that she plans to go to the beach, wear a bathing suit, travel to different parts of the country by car or train. Pt. Discussed desire to return to [place] as this is the home of her deceased husband and she continues to have fond memories there. Pt. Also discussed her desire to have general better health and less pain in her body. Therapist discussed her emotional expectations post-surgery. Pt. Was encouraged to think about the efforts that she will need to continue to make to acknowledge her feelings and find healthy ways of expressing her feelings that do not involve food. Pt. Was encouraged to continue therapy post surgery, to keep a journal during her recovery and after, and to continue to talk openly to her family about her feelings and personal boundaries.   Plan: Return again in 2 weeks.  Diagnosis:      Major Depressive Disorder    Shaune PollackBrown, Jennifer B, [facility] 07/26/2017

## 2017-07-28 ENCOUNTER — Other Ambulatory Visit: Payer: Self-pay | Admitting: Internal Medicine

## 2017-07-28 ENCOUNTER — Other Ambulatory Visit (HOSPITAL_COMMUNITY): Payer: Self-pay | Admitting: Psychiatry

## 2017-07-28 DIAGNOSIS — F4310 Post-traumatic stress disorder, unspecified: Secondary | ICD-10-CM

## 2017-08-09 DIAGNOSIS — Z6841 Body Mass Index (BMI) 40.0 and over, adult: Secondary | ICD-10-CM

## 2017-08-12 DIAGNOSIS — Z9884 Bariatric surgery status: Secondary | ICD-10-CM | POA: Insufficient documentation

## 2017-08-18 ENCOUNTER — Ambulatory Visit (HOSPITAL_COMMUNITY): Payer: Medicaid Other | Admitting: Psychiatry

## 2017-08-21 ENCOUNTER — Other Ambulatory Visit: Payer: Self-pay

## 2017-08-21 ENCOUNTER — Emergency Department (HOSPITAL_COMMUNITY): Payer: Medicaid Other

## 2017-08-21 ENCOUNTER — Emergency Department (HOSPITAL_COMMUNITY)
Admission: EM | Admit: 2017-08-21 | Discharge: 2017-08-22 | Disposition: A | Discharge status: Other Acute Inpatient Hospital | Payer: Medicaid Other | Attending: Emergency Medicine | Admitting: Emergency Medicine

## 2017-08-21 ENCOUNTER — Encounter (HOSPITAL_COMMUNITY): Payer: Self-pay | Admitting: Emergency Medicine

## 2017-08-21 DIAGNOSIS — R1084 Generalized abdominal pain: Secondary | ICD-10-CM | POA: Diagnosis not present

## 2017-08-21 DIAGNOSIS — R55 Syncope and collapse: Secondary | ICD-10-CM | POA: Diagnosis present

## 2017-08-21 DIAGNOSIS — R112 Nausea with vomiting, unspecified: Secondary | ICD-10-CM | POA: Diagnosis not present

## 2017-08-21 DIAGNOSIS — E119 Type 2 diabetes mellitus without complications: Secondary | ICD-10-CM | POA: Diagnosis not present

## 2017-08-21 DIAGNOSIS — J45909 Unspecified asthma, uncomplicated: Secondary | ICD-10-CM | POA: Insufficient documentation

## 2017-08-21 DIAGNOSIS — I10 Essential (primary) hypertension: Secondary | ICD-10-CM | POA: Insufficient documentation

## 2017-08-21 LAB — I-STAT BETA HCG BLOOD, ED (MC, WL, AP ONLY)

## 2017-08-21 LAB — COMPREHENSIVE METABOLIC PANEL
ALT: 21 U/L (ref 0–44)
ANION GAP: 12 (ref 5–15)
AST: 28 U/L (ref 15–41)
Albumin: 4 g/dL (ref 3.5–5.0)
Alkaline Phosphatase: 73 U/L (ref 38–126)
BUN: 10 mg/dL (ref 6–20)
CALCIUM: 9.3 mg/dL (ref 8.9–10.3)
CO2: 22 mmol/L (ref 22–32)
CREATININE: 1.35 mg/dL — AB (ref 0.44–1.00)
Chloride: 108 mmol/L (ref 98–111)
GFR, EST AFRICAN AMERICAN: 52 mL/min — AB (ref >60)
GFR, EST NON AFRICAN AMERICAN: 45 mL/min — AB (ref >60)
Glucose, Bld: 101 mg/dL — ABNORMAL HIGH (ref 70–99)
Potassium: 3.6 mmol/L (ref 3.5–5.1)
SODIUM: 142 mmol/L (ref 135–145)
Total Bilirubin: 0.8 mg/dL (ref 0.3–1.2)
Total Protein: 7.2 g/dL (ref 6.5–8.1)

## 2017-08-21 LAB — CBC WITH DIFFERENTIAL/PLATELET
BASOS ABS: 0 10*3/uL (ref 0.0–0.1)
Basophils Relative: 0 %
EOS ABS: 0 10*3/uL (ref 0.0–0.7)
EOS PCT: 1 %
HCT: 42 % (ref 36.0–46.0)
Hemoglobin: 13.8 g/dL (ref 12.0–15.0)
LYMPHS PCT: 12 %
Lymphs Abs: 0.8 10*3/uL (ref 0.7–4.0)
MCH: 28.5 pg (ref 26.0–34.0)
MCHC: 32.9 g/dL (ref 30.0–36.0)
MCV: 86.8 fL (ref 78.0–100.0)
Monocytes Absolute: 0 10*3/uL — ABNORMAL LOW (ref 0.1–1.0)
Monocytes Relative: 1 %
Neutro Abs: 6.2 10*3/uL (ref 1.7–7.7)
Neutrophils Relative %: 86 %
PLATELETS: 258 10*3/uL (ref 150–400)
RBC: 4.84 MIL/uL (ref 3.87–5.11)
RDW: 15.6 % — ABNORMAL HIGH (ref 11.5–15.5)
WBC: 7.2 10*3/uL (ref 4.0–10.5)

## 2017-08-21 LAB — LIPASE, BLOOD: LIPASE: 156 U/L — AB (ref 11–51)

## 2017-08-21 LAB — I-STAT CG4 LACTIC ACID, ED
Lactic Acid, Venous: 2.66 mmol/L (ref 0.5–1.9)
Lactic Acid, Venous: 1.37 mmol/L (ref 0.5–1.9)

## 2017-08-21 LAB — CBG MONITORING, ED: GLUCOSE-CAPILLARY: 104 mg/dL — AB (ref 70–99)

## 2017-08-21 LAB — I-STAT TROPONIN, ED: TROPONIN I, POC: 0.01 ng/mL (ref 0.00–0.08)

## 2017-08-21 MED ORDER — SODIUM CHLORIDE 0.9 % IV BOLUS
500.0000 mL | Freq: Once | INTRAVENOUS | Status: AC
  Administered 2017-08-21: 500 mL via INTRAVENOUS

## 2017-08-21 MED ORDER — IOPAMIDOL (ISOVUE-300) INJECTION 61%
INTRAVENOUS | Status: AC
  Filled 2017-08-21: qty 100

## 2017-08-21 MED ORDER — ONDANSETRON HCL 4 MG/2ML IJ SOLN
4.0000 mg | Freq: Once | INTRAMUSCULAR | Status: AC
  Administered 2017-08-21: 4 mg via INTRAVENOUS
  Filled 2017-08-21: qty 2

## 2017-08-21 MED ORDER — HYDROMORPHONE HCL 1 MG/ML IJ SOLN
1.0000 mg | Freq: Once | INTRAMUSCULAR | Status: AC
  Administered 2017-08-21: 1 mg via INTRAVENOUS
  Filled 2017-08-21: qty 1

## 2017-08-21 MED ORDER — LEVOFLOXACIN IN D5W 750 MG/150ML IV SOLN
750.0000 mg | Freq: Once | INTRAVENOUS | Status: AC
  Administered 2017-08-21: 750 mg via INTRAVENOUS
  Filled 2017-08-21: qty 150

## 2017-08-21 MED ORDER — METRONIDAZOLE IN NACL 5-0.79 MG/ML-% IV SOLN
500.0000 mg | Freq: Once | INTRAVENOUS | Status: AC
  Administered 2017-08-21: 500 mg via INTRAVENOUS
  Filled 2017-08-21: qty 100

## 2017-08-21 MED ORDER — PROMETHAZINE HCL 25 MG/ML IJ SOLN
12.5000 mg | Freq: Once | INTRAMUSCULAR | Status: AC
  Administered 2017-08-22: 12.5 mg via INTRAVENOUS
  Filled 2017-08-21 (×2): qty 1

## 2017-08-21 MED ORDER — IOPAMIDOL (ISOVUE-300) INJECTION 61%
100.0000 mL | Freq: Once | INTRAVENOUS | Status: AC | PRN
  Administered 2017-08-21: 100 mL via INTRAVENOUS

## 2017-08-21 MED ORDER — LACTATED RINGERS IV SOLN
INTRAVENOUS | Status: DC
  Administered 2017-08-22: via INTRAVENOUS

## 2017-08-21 NOTE — ED Notes (Signed)
ED TO INPATIENT HANDOFF REPORT  Name/Age/Gender Melanie Hess 47 y.o. female  Code Status Code Status History    Date Active Date Inactive Code Status Order ID Comments User Context   04/07/2016 1215 04/08/2016 2259 Full Code [id number]  Samella Parr, NP ED   11/08/2015 0544 11/11/2015 1719 Full Code [id number]  Etta Quill, DO ED   08/01/2015 1722 08/02/2015 1912 Full Code [id number]  Macie Burows Inpatient   03/24/2014 0859 03/26/2014 2115 Full Code [id number]  Jani Gravel, MD ED      Home/SNF/Other Home  Chief Complaint syncope; abd pain  Level of Care/Admitting Diagnosis ED Disposition    ED Disposition Condition Comment   Transfer to Another Facility  The patient appears reasonably stabilized for transfer considering the current resources, flow, and capabilities available in the ED at this time, and I doubt any other [facility] requiring further screening and/or treatment in the ED prior to transfer is p resent.       Medical History Past Medical History:  Diagnosis Date  . Ankle fracture, right 2001  . Anxiety   . Arthritis   . Asthma   . Carpal tunnel syndrome    Bilateral  . Diabetes mellitus without complication (HCC)    Type II  . GERD (gastroesophageal reflux disease)   . Hypertension   . Migraine   . OSA (obstructive sleep apnea)   . Plantar fasciitis of right foot   . Pneumonia    hx  . Shingles 11/2015  . Shortness of breath dyspnea    with walking short distances    Allergies Allergies  Allergen Reactions  . Aspirin Shortness Of Breath and Swelling  . Nsaids Shortness Of Breath and Swelling  . Penicillins Anaphylaxis, Swelling, Rash and Other (See Comments)    PCN reaction causing immediate rash, facial/tongue/throat swelling, SOB or lightheadedness with hypotension: YES PCN reaction causing severe rash involving mucus membranes or skin necrosis: YES PCN reaction that required hospitalization NO PCN reaction occurring within the last 10  years: NO   . Tolmetin Shortness Of Breath and Swelling  . Oxycodone-Acetaminophen Itching and Hives  . Percocet [Oxycodone-Acetaminophen] Hives and Itching  . Vicodin [Hydrocodone-Acetaminophen] Hives and Itching  . Tramadol Nausea Only    IV Location/Drains/Wounds Patient Lines/Drains/Airways Status   Active Line/Drains/Airways    Name:   Placement date:   Placement time:   Site:   Days:   Peripheral IV 08/21/17 Left;Anterior Antecubital   08/21/17    2138    Antecubital   less than 1   Peripheral IV 08/21/17 Right Hand   08/21/17    2321    Hand   less than 1   Incision (Closed) 08/01/15 Leg Right   08/01/15    1505     751   Incision (Closed) 11/08/15 Ankle Right   11/08/15    1230     652   Incision (Closed) 01/27/16 Foot Left   01/27/16    1150     572          Labs/Imaging Results for orders placed or performed during the hospital encounter of 08/21/17 (from the past 48 hour(s))  CBG monitoring, ED     Status: Abnormal   Collection Time: 08/21/17  9:07 PM  Result Value Ref Range   Glucose-Capillary 104 (H) 70 - 99 mg/dL   Comment 1 Notify RN   Comprehensive metabolic panel     Status: Abnormal   Collection Time: 08/21/17  9:40 PM  Result Value Ref Range   Sodium 142 135 - 145 mmol/L   Potassium 3.6 3.5 - 5.1 mmol/L   Chloride 108 98 - 111 mmol/L   CO2 22 22 - 32 mmol/L   Glucose, Bld 101 (H) 70 - 99 mg/dL   BUN 10 6 - 20 mg/dL   Creatinine, Ser 1.35 (H) 0.44 - 1.00 mg/dL   Calcium 9.3 8.9 - 10.3 mg/dL   Total Protein 7.2 6.5 - 8.1 g/dL   Albumin 4.0 3.5 - 5.0 g/dL   AST 28 15 - 41 U/L   ALT 21 0 - 44 U/L   Alkaline Phosphatase 73 38 - 126 U/L   Total Bilirubin 0.8 0.3 - 1.2 mg/dL   GFR calc non Af Amer 45 (L) >60 mL/min   GFR calc Af Amer 52 (L) >60 mL/min    Comment: (NOTE) The eGFR has been calculated using the CKD EPI equation. This calculation has not been validated in all clinical situations. eGFR's persistently <60 mL/min signify possible Chronic  Kidney Disease.    Anion gap 12 5 - 15    Comment: Performed at [facility], [place] [address], [place], [place] [address]  Lipase, blood     Status: Abnormal   Collection Time: 08/21/17  9:40 PM  Result Value Ref Range   Lipase 156 (H) 11 - 51 U/L    Comment: Performed at [facility], [place] [address] [place], [place] [address]  CBC with Differential     Status: Abnormal   Collection Time: 08/21/17  9:40 PM  Result Value Ref Range   WBC 7.2 4.0 - 10.5 K/uL   RBC 4.84 3.87 - 5.11 MIL/uL   Hemoglobin 13.8 12.0 - 15.0 g/dL   HCT 42.0 36.0 - 46.0 %   MCV 86.8 78.0 - 100.0 fL   MCH 28.5 26.0 - 34.0 pg   MCHC 32.9 30.0 - 36.0 g/dL   RDW 15.6 (H) 11.5 - 15.5 %   Platelets 258 150 - 400 K/uL   Neutrophils Relative % 86 %   Neutro Abs 6.2 1.7 - 7.7 K/uL   Lymphocytes Relative 12 %   Lymphs Abs 0.8 0.7 - 4.0 K/uL   Monocytes Relative 1 %   Monocytes Absolute 0.0 (L) 0.1 - 1.0 K/uL   Eosinophils Relative 1 %   Eosinophils Absolute 0.0 0.0 - 0.7 K/uL   Basophils Relative 0 %   Basophils Absolute 0.0 0.0 - 0.1 K/uL    Comment: Performed at [facility], [place] [address], [place], [place] [address]  I-Stat beta hCG blood, ED     Status: None   Collection Time: 08/21/17  9:49 PM  Result Value Ref Range   I-stat hCG, quantitative <5.0 <5 mIU/mL   Comment 3            Comment:   GEST. AGE      CONC.  (mIU/mL)   <=1 WEEK        5 - 50     2 WEEKS       50 - 500     3 WEEKS       100 - 10,000     4 WEEKS     1,000 - 30,000        FEMALE AND NON-PREGNANT FEMALE:     LESS THAN 5 mIU/mL   I-stat troponin, ED     Status: None   Collection Time: 08/21/17  9:50 PM  Result Value Ref Range   Troponin i, poc 0.01 0.00 - 0.08 ng/mL   Comment 3            Comment: Due to the release kinetics of cTnI, a negative result within the first hours of the onset of symptoms does not rule out myocardial infarction with certainty. If myocardial  infarction is still suspected, repeat the test at appropriate intervals.   I-Stat CG4 Lactic Acid, ED     Status: Abnormal   Collection Time: 08/21/17  9:50 PM  Result Value Ref Range   Lactic Acid, Venous 2.66 (HH) 0.5 - 1.9 mmol/L   Comment NOTIFIED PHYSICIAN   I-Stat CG4 Lactic Acid, ED     Status: None   Collection Time: 08/21/17 11:27 PM  Result Value Ref Range   Lactic Acid, Venous 1.37 0.5 - 1.9 mmol/L   Ct Abdomen Pelvis W Contrast  Result Date: 08/21/2017 CLINICAL DATA:  Episodes of vomiting prior to syncopal episode after recent gastric bypass surgery 2 weeks ago. EXAM: CT ABDOMEN AND PELVIS WITH CONTRAST TECHNIQUE: Multidetector CT imaging of the abdomen and pelvis was performed using the standard protocol following bolus administration of intravenous contrast. CONTRAST:  190m ISOVUE-300 IOPAMIDOL (ISOVUE-300) INJECTION 61% COMPARISON:  01/22/2016 FINDINGS: Lower chest: Heart size is normal. There is passive bilateral dependent atelectasis noted. Hepatobiliary: Homogeneous in appearance without space-occupying mass nor biliary dilatation. Gallbladder is unremarkable. Pancreas: Normal Spleen: Normal Adrenals/Urinary Tract: Normal appearing adrenal glands. Symmetric enhancement of both kidneys. No nephrolithiasis nor obstructive uropathy. Delayed repeat imaging through the kidneys demonstrate symmetric pyelograms. The urinary bladder is unremarkable. Stomach/Bowel: Staple line from prior bariatric surgery is noted. There is a small hiatal hernia present. No gastric or small bowel dilatation. No inflammation. Moderate stool burden within the colon without large bowel inflammation. Postop change noted along the ventral abdomen. Vascular/Lymphatic: No significant vascular findings are present. No enlarged abdominal or pelvic lymph nodes. Reproductive: Uterus and bilateral adnexa are unremarkable. Other: Small fat containing umbilical hernia. No abdominopelvic ascites. Musculoskeletal: No acute  or significant osseous findings. IMPRESSION: 1. Increased colonic stool burden without bowel obstruction or inflammation status post bariatric surgery. 2. Postoperative soft tissue induration along the supraumbilical ventral abdomen. No seroma or abscess. No acute abnormality. Electronically Signed   By: DAshley RoyaltyM.D.   On: 08/21/2017 23:09   Dg Abdomen Acute W/chest  Result Date: 08/21/2017 CLINICAL DATA:  [facility] with patient reporting nausea, vomiting and abdominal pain EXAM: DG ABDOMEN ACUTE W/ 1V CHEST COMPARISON:  Chest radiographs 10/09/2016, abdominal radiograph 03/15/2016 FINDINGS: Normal heart size, mediastinal contours, and pulmonary vascularity. Lungs clear. No pleural effusion or pneumothorax. Perigastric surgical clips. Bowel gas pattern normal. No bowel dilatation, bowel wall thickening, or free air. Bones unremarkable. No urinary tract calcification. IMPRESSION: No acute abnormalities. Electronically Signed   By: MLavonia DanaM.D.   On: 08/21/2017 22:46    Pending Labs Unresulted Labs (From admission, onward)   Start     Ordered   08/21/17 2153  Blood Culture (routine x 2)  BLOOD CULTURE X 2,   STAT     08/21/17 2153   08/21/17 2120  Urinalysis, Routine w reflex microscopic  STAT,   STAT     08/21/17 2120      Vitals/Pain Today's Vitals   08/21/17 2053 08/21/17 2059 08/21/17 2300 08/21/17 2333  BP:      Pulse:  71  Resp:    16  Temp:      TempSrc:      SpO2:    95%  Weight: (!) 326 lb (147.9 kg)     Height: 5' 5"  (1.651 m)     PainSc: 10-Worst pain ever 0-No pain 0-No pain 0-No pain    Isolation Precautions No active isolations  Medications Medications  levofloxacin (LEVAQUIN) IVPB 750 mg (750 mg Intravenous New Bag/Given 08/21/17 2332)  metroNIDAZOLE (FLAGYL) IVPB 500 mg (500 mg Intravenous New Bag/Given 08/21/17 2327)  iopamidol (ISOVUE-300) 61 % injection (has no administration in time range)   sodium chloride 0.9 % bolus 500 mL (0 mLs Intravenous Stopped 08/21/17 2328)  HYDROmorphone (DILAUDID) injection 1 mg (1 mg Intravenous Given 08/21/17 2149)  ondansetron (ZOFRAN) injection 4 mg (4 mg Intravenous Given 08/21/17 2148)  iopamidol (ISOVUE-300) 61 % injection 100 mL (100 mLs Intravenous Contrast Given 08/21/17 2236)    Mobility walks

## 2017-08-21 NOTE — ED Triage Notes (Signed)
Pt presents by [facility] pt had near syncopal episode while walking with daughter. EMS reports that pt is [facility]. EMS reported pt having episodes of vomiting prior to syncopal episode. Pt reporting nausea and vomiting. Pt reported not being able keep liquids down.

## 2017-08-21 NOTE — ED Notes (Signed)
Pt. Still in RADIOLOGY.

## 2017-08-21 NOTE — ED Provider Notes (Signed)
Emergency Department Provider Note   I have reviewed the triage vital signs and the nursing notes.   HISTORY  Chief Complaint Near Syncope   HPI Melanie Hess is a 47 y.o. 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 Lily PeerFernandez presents to the emergency department for evaluation of worsening abdominal pain today with continued nausea and vomiting.  Family states she is only able to eat ice and cannot tolerate anything by mouth without vomiting.  Last bowel movement was 4 days ago.  She denies fevers.  Family states that today she has had several days of near syncope.  No seizure activity.  No prior history of similar.  Patient denies any chest pain, shortness of breath, heart palpitations.   Past Medical History:  Diagnosis Date  . Ankle fracture, right 2001  . Anxiety   . Arthritis   . Asthma   . Carpal tunnel syndrome    Bilateral  . Diabetes mellitus without complication (HCC)    Type II  . GERD (gastroesophageal reflux disease)   . Hypertension   . Migraine   . OSA (obstructive sleep apnea)   . Plantar fasciitis of right foot   . Pneumonia    hx  . Shingles 11/2015  . Shortness of breath dyspnea    with walking short distances    Patient Active Problem List   Diagnosis Date Noted  . Esophageal reflux 03/10/2017  . Preoperative clearance 08/12/2016  . Chest pain 04/07/2016  . LVH (left ventricular hypertrophy) 04/07/2016  . Hepatic steatosis 04/07/2016  . Diabetes mellitus with complication (HCC)   . Moderate persistent asthma 12/09/2015  . OSA on CPAP 10/23/2015  . Pain of right heel 08/01/2015  . Chronic pain   . Anemia 03/24/2014  . Diabetes mellitus type 2, controlled, without complications (HCC) 03/24/2014  . Asthma exacerbation 03/24/2014  . Hypertension 02/28/2014  . Morbid obesity (HCC) 02/28/2014  . DOE (dyspnea on exertion) 02/28/2014  . Dyspnea and respiratory abnormality 02/14/2014    Past Surgical  History:  Procedure Laterality Date  . CARPAL TUNNEL RELEASE Left   . GASTRIC BYPASS    . HEEL SPUR RESECTION Right 08/01/2015   Procedure: HEEL SPUR EXCISIONS;  Surgeon: Marcene CorningPeter Dalldorf, MD;  [facility];  Service: Orthopedics;  Laterality: Right;  . HEEL SPUR RESECTION Left 01/27/2016   Procedure: LEFT HEEL SPUR EXCISION AND TENDON ACHILLIES REPAIR;  Surgeon: Marcene CorningPeter Dalldorf, MD;  Location: [facility] OR;  Service: Orthopedics;  Laterality: Left;  PRONE POSITION  . TUBAL LIGATION      Allergies Aspirin; Nsaids; Penicillins; Tolmetin; Oxycodone-acetaminophen; Percocet [oxycodone-acetaminophen]; Vicodin [hydrocodone-acetaminophen]; and Tramadol  Family History  Problem Relation Age of Onset  . Hypertension Mother   . Pulmonary embolism Mother   . Depression Mother   . Breast cancer Maternal Aunt   . Brain cancer Cousin   . CAD Maternal Grandmother   . Microcephaly Maternal Grandmother   . Heart attack Maternal Grandmother     Social History Social History   Tobacco Use  . Smoking status: Never Smoker  . Smokeless tobacco: Never Used  . Tobacco comment: years ago may have smoked 1 cig or less a week  Substance Use Topics  . Alcohol use: No    Alcohol/week: 0.0 oz  . Drug use: No    Review of Systems  Constitutional: No fever/chills Eyes: No visual changes. ENT: No sore throat. Cardiovascular: Denies chest pain. Positive near syncope.  Respiratory:  Denies shortness of breath. Gastrointestinal: Positive abdominal pain. Positive nausea and vomiting.  No diarrhea. Positive constipation. Genitourinary: Negative for dysuria. Musculoskeletal: Negative for back pain. Skin: Negative for rash. Neurological: Negative for headaches, focal weakness or numbness.  10-point ROS otherwise negative.  ____________________________________________   PHYSICAL EXAM:  VITAL SIGNS: ED Triage Vitals  Enc Vitals Group     BP 08/21/17 2047 127/82     Pulse Rate 08/21/17 2047 76     Resp  08/21/17 2047 20     Temp 08/21/17 2047 97.9 F (36.6 C)     Temp Source 08/21/17 2047 Oral     SpO2 08/21/17 2047 100 %     Weight 08/21/17 2053 (!) 326 lb (147.9 kg)     Height 08/21/17 2053 5\' 5"  (1.651 m)     Pain Score 08/21/17 2053 10   Constitutional: Alert and oriented. Patient appears to be in acute distress. She is moaning and frequently shifting and moving in the bed.  Eyes: Conjunctivae are normal Head: Atraumatic. Nose: No congestion/rhinnorhea. Mouth/Throat: Mucous membranes are dry.  Neck: No stridor.  Cardiovascular: Normal rate, regular rhythm. Good peripheral circulation. Grossly normal heart sounds.   Respiratory: Normal respiratory effort.  No retractions. Lungs CTAB. Gastrointestinal: Soft with LUQ tenderness and voluntary guarding. No rebound. Well-appearing incisions. No distention.  Musculoskeletal: No lower extremity tenderness nor edema. No gross deformities of extremities. Neurologic:  Normal speech and language. No gross focal neurologic deficits are appreciated.  Skin:  Skin is warm, dry and intact. No rash noted  ____________________________________________   LABS (all labs ordered are listed, but only abnormal results are displayed)  Labs Reviewed  COMPREHENSIVE METABOLIC PANEL - Abnormal; Notable for the following components:      Result Value   Glucose, Bld 101 (*)    Creatinine, Ser 1.35 (*)    GFR calc non Af Amer 45 (*)    GFR calc Af Amer 52 (*)    All other components within normal limits  LIPASE, BLOOD - Abnormal; Notable for the following components:   Lipase 156 (*)    All other components within normal limits  CBC WITH DIFFERENTIAL/PLATELET - Abnormal; Notable for the following components:   RDW 15.6 (*)    Monocytes Absolute 0.0 (*)    All other components within normal limits  CBG MONITORING, ED - Abnormal; Notable for the following components:   Glucose-Capillary 104 (*)    All other components within normal limits  I-STAT CG4  LACTIC ACID, ED - Abnormal; Notable for the following components:   Lactic Acid, Venous 2.66 (*)    All other components within normal limits  CULTURE, BLOOD (ROUTINE X 2)  CULTURE, BLOOD (ROUTINE X 2)  URINALYSIS, ROUTINE W REFLEX MICROSCOPIC  I-STAT TROPONIN, ED  I-STAT BETA HCG BLOOD, ED ([facility], [facility], [facility] ONLY)  I-STAT CG4 LACTIC ACID, ED   ____________________________________________  EKG   EKG Interpretation  Date/Time:  Sunday August 21 2017 20:46:21 EDT Ventricular Rate:  70 PR Interval:    QRS Duration: 97 QT Interval:  409 QTC Calculation: 442 R Axis:   61 Text Interpretation:  Sinus rhythm No STEMI.  Confirmed by Alona Bene [phone]) on 08/21/2017 9:03:05 PM       ____________________________________________  RADIOLOGY  Ct Abdomen Pelvis W Contrast  Result Date: 08/21/2017 CLINICAL DATA:  Episodes of vomiting prior to syncopal episode after recent gastric bypass surgery 2 weeks ago. EXAM: CT ABDOMEN AND PELVIS WITH CONTRAST TECHNIQUE: Multidetector CT imaging of  the abdomen and pelvis was performed using the standard protocol following bolus administration of intravenous contrast. CONTRAST:  ISOVUE-300 IOPAMIDOL (ISOVUE-300) INJECTION 61% COMPARISON:  01/22/2016 FINDINGS: Lower chest: Heart size is normal. There is passive bilateral dependent atelectasis noted. Hepatobiliary: Homogeneous in appearance without space-occupying mass nor biliary dilatation. Gallbladder is unremarkable. Pancreas: Normal Spleen: Normal Adrenals/Urinary Tract: Normal appearing adrenal glands. Symmetric enhancement of both kidneys. No nephrolithiasis nor obstructive uropathy. Delayed repeat imaging through the kidneys demonstrate symmetric pyelograms. The urinary bladder is unremarkable. Stomach/Bowel: Staple line from prior bariatric surgery is noted. There is a small hiatal hernia present. No gastric or small bowel dilatation. No inflammation. Moderate stool burden within the colon without large  bowel inflammation. Postop change noted along the ventral abdomen. Vascular/Lymphatic: No significant vascular findings are present. No enlarged abdominal or pelvic lymph nodes. Reproductive: Uterus and bilateral adnexa are unremarkable. Other: Small fat containing umbilical hernia. No abdominopelvic ascites. Musculoskeletal: No acute or significant osseous findings. IMPRESSION: 1. Increased colonic stool burden without bowel obstruction or inflammation status post bariatric surgery. 2. Postoperative soft tissue induration along the supraumbilical ventral abdomen. No seroma or abscess. No acute abnormality. Electronically Signed   By: Tollie Eth M.D.   On: 08/21/2017 23:09   Dg Abdomen Acute W/chest  Result Date: 08/21/2017 CLINICAL DATA:  Gastric bypass surgery at [facility] 2 weeks ago, with patient reporting nausea, vomiting and abdominal pain EXAM: DG ABDOMEN ACUTE W/ 1V CHEST COMPARISON:  Chest radiographs 10/09/2016, abdominal radiograph 03/15/2016 FINDINGS: Normal heart size, mediastinal contours, and pulmonary vascularity. Lungs clear. No pleural effusion or pneumothorax. Perigastric surgical clips. Bowel gas pattern normal. No bowel dilatation, bowel wall thickening, or free air. Bones unremarkable. No urinary tract calcification. IMPRESSION: No acute abnormalities. Electronically Signed   By: Ulyses Southward M.D.   On: 08/21/2017 22:46    ____________________________________________   PROCEDURES  Procedure(s) performed:   Procedures  CRITICAL CARE Performed by: Maia Plan Total critical care time: 35 minutes Critical care time was exclusive of separately billable procedures and treating other patients. Critical care was necessary to treat or prevent imminent or life-threatening deterioration. Critical care was time spent personally by me on the following activities: development of treatment plan with patient and/or surrogate as well as nursing, discussions with  consultants, evaluation of patient's response to treatment, examination of patient, obtaining history from patient or surrogate, ordering and performing treatments and interventions, ordering and review of laboratory studies, ordering and review of radiographic studies, pulse oximetry and re-evaluation of patient's condition.  Alona Bene, MD Emergency Medicine  ____________________________________________   INITIAL IMPRESSION / ASSESSMENT AND PLAN / ED COURSE  Pertinent labs & imaging results that were available during my care of the patient were reviewed by me and considered in my medical decision making (see chart for details).  Patient presents to the emergency department with abdominal pain, vomiting, and near syncope in the setting of recent sleeve gastrectomy.  Procedure performed at [facility] with Dr. Brayton Layman.  Patient has voluntary guarding on exam and appears uncomfortable.  Labs reviewed which showed elevated lactic acid normal white count.  Possibly secondary to dehydration versus ileus but will cover with antibiotics at least in the short-term.  Acute abdomen x-ray shows no free air or obvious obstruction.  CT abdomen pelvis followed which shows no abscess, bowel obstruction, perforation.  Plan to discuss the case with GI surgery at [facility] for transfer, IV fluids, possible observational admission.  11:45 PM Spoke  with GI surgery, Dr. Lily Peer. He accepts the patient in transfer to the ED where he can evaluate there. Continue IVF and abx here. [facility] will provide transport.   I reviewed all nursing notes, vitals, pertinent old records, EKGs, labs, imaging (as available).  ____________________________________________  FINAL CLINICAL IMPRESSION(S) / ED DIAGNOSES  Final diagnoses:  Near syncope  Generalized abdominal pain  Non-intractable vomiting with nausea, unspecified vomiting type     MEDICATIONS GIVEN DURING THIS VISIT:  Medications  levofloxacin (LEVAQUIN)  IVPB 750 mg (750 mg Intravenous New Bag/Given 08/21/17 2332)  metroNIDAZOLE (FLAGYL) IVPB 500 mg (500 mg Intravenous New Bag/Given 08/21/17 2327)  iopamidol (ISOVUE-300) 61 % injection (has no administration in time range)  sodium chloride 0.9 % bolus 500 mL (0 mLs Intravenous Stopped 08/21/17 2328)  HYDROmorphone (DILAUDID) injection 1 mg (1 mg Intravenous Given 08/21/17 2149)  ondansetron (ZOFRAN) injection 4 mg (4 mg Intravenous Given 08/21/17 2148)  iopamidol (ISOVUE-300) 61 % injection 100 mL (100 mLs Intravenous Contrast Given 08/21/17 2236)    Note:  This document was prepared using Dragon voice recognition software and may include unintentional dictation errors.  Alona Bene, MD Emergency Medicine    Amal Renbarger, Arlyss Repress, MD 08/21/17 [phone]

## 2017-08-21 NOTE — ED Notes (Signed)
Patient transported to X-ray 

## 2017-08-21 NOTE — ED Notes (Signed)
Bed: [id number] Expected date:  Expected time:  Means of arrival:  Comments: 47 yo F/Syncope

## 2017-08-21 NOTE — Progress Notes (Signed)
A consult was received from an ED physician for levaquin and flagyl per pharmacy dosing.  The patient's profile has been reviewed for ht/wt/allergies/indication/available labs.   A one time order has been placed for Levaquin 750 mg and Flagyl 500 mg.  Further antibiotics/pharmacy consults should be ordered by admitting physician if indicated.                       Thank you, Lorenza EvangelistGreen, Rozell Kettlewell R 08/21/2017  10:09 PM

## 2017-08-22 DIAGNOSIS — R112 Nausea with vomiting, unspecified: Secondary | ICD-10-CM | POA: Insufficient documentation

## 2017-08-27 LAB — CULTURE, BLOOD (ROUTINE X 2)
CULTURE: NO GROWTH
Culture: NO GROWTH
SPECIAL REQUESTS: ADEQUATE
Special Requests: ADEQUATE

## 2017-08-29 ENCOUNTER — Emergency Department (HOSPITAL_COMMUNITY)
Admission: EM | Admit: 2017-08-29 | Discharge: 2017-08-29 | Disposition: A | Discharge status: Home / Self Care | Payer: Medicaid Other | Attending: Emergency Medicine | Admitting: Emergency Medicine

## 2017-08-29 ENCOUNTER — Other Ambulatory Visit: Payer: Self-pay

## 2017-08-29 ENCOUNTER — Encounter (HOSPITAL_COMMUNITY): Payer: Self-pay | Admitting: *Deleted

## 2017-08-29 DIAGNOSIS — J45909 Unspecified asthma, uncomplicated: Secondary | ICD-10-CM | POA: Diagnosis not present

## 2017-08-29 DIAGNOSIS — S40861A Insect bite (nonvenomous) of right upper arm, initial encounter: Secondary | ICD-10-CM | POA: Diagnosis not present

## 2017-08-29 DIAGNOSIS — Y92009 Unspecified place in unspecified non-institutional (private) residence as the place of occurrence of the external cause: Secondary | ICD-10-CM | POA: Diagnosis not present

## 2017-08-29 DIAGNOSIS — W57XXXA Bitten or stung by nonvenomous insect and other nonvenomous arthropods, initial encounter: Secondary | ICD-10-CM | POA: Insufficient documentation

## 2017-08-29 DIAGNOSIS — Y999 Unspecified external cause status: Secondary | ICD-10-CM | POA: Diagnosis not present

## 2017-08-29 DIAGNOSIS — Y939 Activity, unspecified: Secondary | ICD-10-CM | POA: Insufficient documentation

## 2017-08-29 DIAGNOSIS — Z79899 Other long term (current) drug therapy: Secondary | ICD-10-CM | POA: Diagnosis not present

## 2017-08-29 DIAGNOSIS — R21 Rash and other nonspecific skin eruption: Secondary | ICD-10-CM

## 2017-08-29 DIAGNOSIS — S20469A Insect bite (nonvenomous) of unspecified back wall of thorax, initial encounter: Secondary | ICD-10-CM | POA: Diagnosis not present

## 2017-08-29 DIAGNOSIS — E119 Type 2 diabetes mellitus without complications: Secondary | ICD-10-CM | POA: Insufficient documentation

## 2017-08-29 DIAGNOSIS — S40862A Insect bite (nonvenomous) of left upper arm, initial encounter: Secondary | ICD-10-CM | POA: Insufficient documentation

## 2017-08-29 MED ORDER — HYDROCORTISONE 1 % EX CREA: TOPICAL_CREAM | CUTANEOUS | 0 refills | Status: DC

## 2017-08-29 NOTE — ED Notes (Signed)
Bed: WTR9 Expected date:  Expected time:  Means of arrival:  Comments: 

## 2017-08-29 NOTE — ED Triage Notes (Signed)
Pt presents with bug bites on pt's right arm, right shoulder, and center of the back.  Pt reports she's had then since before 08/09/17 (which was when pt had gastric sleeve surgery).  Pt states she's been putting topical cream on the site without much relief to the itching.  Pt denies denies any fevers or chills.  Pt reports she's been breaking out on her skin where she had EKG lead stickers and IVs. Pt a/o x 4 and ambulatory.

## 2017-08-29 NOTE — ED Notes (Signed)
Pt unable to sign due to signature pad not working. Pt verbalizes understanding of DC papers and prescriptions. Pt had prescriptions in hand when DC'ed

## 2017-08-29 NOTE — Discharge Instructions (Addendum)
You can take Tylenol or Ibuprofen as directed for pain. You can alternate Tylenol and Ibuprofen every 4 hours. If you take Tylenol at 1pm, then you can take Ibuprofen at 5pm. Then you can take Tylenol again at 9pm.   Start taking benadryl to help with your symptoms.   Apply hydrocortisone cream as directed.  Follow-up with your primary care doctor in 5 to 7 days for further evaluation.  Return emergency department for any fevers, worsening rash, redness or drainage from the bites, any other worsening or concerning symptoms.

## 2017-08-29 NOTE — ED Provider Notes (Signed)
[facility]-EMERGENCY DEPT Provider Note   CSN: [id number] Arrival date & time: 08/29/17  1043     History   Chief Complaint Chief Complaint  Patient presents with  . Insect Bite    HPI Melanie Hess is a 47 y.o. female who presents for evaluation of rash, insect bites that have been intermittently ongoing for the last several weeks.  Patient reports that is been eventually, she has had intermittent bug bites to bilateral arms, back.  Patient states that these happen when she does affect her sister's house.  Patient states that they will eventually healed when she returned her sister's house, she gets them again.  Additionally, patient reports she was hospitalized for nausea/vomiting after gastric bypass sleeve surgery.  She reports that she had EKG stickers placed.  Patient reports that she was discharged and had a rash where the EKG stickers were out.  She does have a history of allergy to adhesive tape.  Patient states she is concerned about these bites and wants to make sure that they are not infected.  She has not noticed any surrounding warmth, erythema.  She reports she had one that was draining slightly the other day but states it has improved.  Patient states she has not had any fevers, difficulty breathing, vomiting.  The history is provided by the patient.    Past Medical History:  Diagnosis Date  . Ankle fracture, right 2001  . Anxiety   . Arthritis   . Asthma   . Carpal tunnel syndrome    Bilateral  . Diabetes mellitus without complication (HCC)    Type II  . GERD (gastroesophageal reflux disease)   . Hypertension   . Migraine   . OSA (obstructive sleep apnea)   . Plantar fasciitis of right foot   . Pneumonia    hx  . Shingles 11/2015  . Shortness of breath dyspnea    with walking short distances    Patient Active Problem List   Diagnosis Date Noted  . Esophageal reflux 03/10/2017  . Preoperative clearance 08/12/2016  . Chest  pain 04/07/2016  . LVH (left ventricular hypertrophy) 04/07/2016  . Hepatic steatosis 04/07/2016  . Diabetes mellitus with complication (HCC)   . Moderate persistent asthma 12/09/2015  . OSA on CPAP 10/23/2015  . Pain of right heel 08/01/2015  . Chronic pain   . Anemia 03/24/2014  . Diabetes mellitus type 2, controlled, without complications (HCC) 03/24/2014  . Asthma exacerbation 03/24/2014  . Hypertension 02/28/2014  . Morbid obesity (HCC) 02/28/2014  . DOE (dyspnea on exertion) 02/28/2014  . Dyspnea and respiratory abnormality 02/14/2014    Past Surgical History:  Procedure Laterality Date  . CARPAL TUNNEL RELEASE Left   . GASTRIC BYPASS    . HEEL SPUR RESECTION Right 08/01/2015   Procedure: HEEL SPUR EXCISIONS;  Surgeon: Marcene CorningPeter Dalldorf, MD;  [facility];  Service: Orthopedics;  Laterality: Right;  . HEEL SPUR RESECTION Left 01/27/2016   Procedure: LEFT HEEL SPUR EXCISION AND TENDON ACHILLIES REPAIR;  Surgeon: Marcene CorningPeter Dalldorf, MD;  Location: [facility] OR;  Service: Orthopedics;  Laterality: Left;  PRONE POSITION  . TUBAL LIGATION       OB History   None      Home Medications    Prior to Admission medications   Medication Sig Start Date End Date Taking? Authorizing Provider  valsartan (DIOVAN) 160 MG tablet TAKE 1 TABLET(160 MG) BY MOUTH DAILY Patient taking differently: Take 320 mg by mouth daily.  07/06/17  Yes Hilty, Lisette AbuKenneth C, MD  albuterol (PROVENTIL) (2.5 MG/3ML) 0.083% nebulizer solution Take 2.5 mg by nebulization 2 (two) times daily.    [provider]  amLODipine (NORVASC) 5 MG tablet TAKE 1 TABLET BY MOUTH DAILY Patient not taking: Reported on 08/29/2017 07/06/17   Azalee CourseMeng, Hao, PA  BREO ELLIPTA 200-25 MCG/INH AEPB INHALE 1 PUFF INTO LUNGS DAILY Patient taking differently: Inhale 1 puff into the lungs daily.  03/30/16   Kalman Shanamaswamy, Murali, MD  cetirizine (ZYRTEC) 10 MG tablet Take 10 mg by mouth as needed for allergies.    [provider]  chlorthalidone  (HYGROTON) 25 MG tablet Take 25 mg by mouth every morning.    [provider]  cyclobenzaprine (FLEXERIL) 10 MG tablet Take 1 tablet (10 mg total) by mouth 2 (two) times daily as needed for muscle spasms. 01/22/16   Madolyn FriezeNagpal, Vijay, MD  escitalopram (LEXAPRO) 10 MG tablet TAKE 1 TABLET DAILY 08/09/17   Arfeen, Melanie GroutSyed T, MD  hydrocortisone cream 1 % Apply to affected area 2 times daily 08/29/17   Graciella FreerLayden, Lindsey A, PA-C  liraglutide (VICTOZA) 18 MG/3ML SOPN Inject 1.8 mg into the skin daily.    [provider]  lisinopril (PRINIVIL,ZESTRIL) 40 MG tablet TAKE 1 TABLET(40 MG) BY MOUTH DAILY 07/28/17   Hilty, Lisette AbuKenneth C, MD  meloxicam (MOBIC) 15 MG tablet Take 15 mg by mouth daily. 06/21/17   [provider]  methocarbamol (ROBAXIN) 500 MG tablet Take 500 mg by mouth 3 (three) times daily. 06/22/17   [provider]  omeprazole (PRILOSEC) 20 MG capsule Take 20 mg by mouth daily.    [provider]  pregabalin (LYRICA) 75 MG capsule Take 75 mg by mouth 2 (two) times daily.    [provider]  PROVENTIL HFA 108 (90 Base) MCG/ACT inhaler INHALE 2 PUFFS INTO THE LUNGS EVERY 6 HOURS AS NEEDED FOR WHEEZING OR SHORTNESS OF BREATH 03/17/17   Bevelyn NgoGroce, Sarah F, NP  TOUJEO SOLOSTAR 300 UNIT/ML SOPN USE 45 UNITS SUBCUTANEOUSLY DAILY 12/20/15   [provider]    Family History Family History  Problem Relation Age of Onset  . Hypertension Mother   . Pulmonary embolism Mother   . Depression Mother   . Breast cancer Maternal Aunt   . Brain cancer Cousin   . CAD Maternal Grandmother   . Microcephaly Maternal Grandmother   . Heart attack Maternal Grandmother     Social History Social History   Tobacco Use  . Smoking status: Never Smoker  . Smokeless tobacco: Never Used  . Tobacco comment: years ago may have smoked 1 cig or less a week  Substance Use Topics  . Alcohol use: No    Alcohol/week: 0.0 standard drinks  . Drug use: No     Allergies   Aspirin;  Nsaids; Penicillins; Tolmetin; Oxycodone-acetaminophen; Percocet [oxycodone-acetaminophen]; Vicodin [hydrocodone-acetaminophen]; Adhesive [tape]; and Tramadol   Review of Systems Review of Systems  Constitutional: Negative for fever.  Respiratory: Negative for shortness of breath.   Gastrointestinal: Negative for vomiting.  Skin: Positive for rash and wound. Negative for color change.  All other systems reviewed and are negative.    Physical Exam Updated Vital Signs BP (!) 142/91   Pulse 65   Temp 98.9 F (37.2 C) (Oral)   Resp 18   Ht 5\' 5"  (1.651 m)   Wt (!) 147.9 kg   SpO2 99%   BMI 54.25 kg/m   Physical Exam  Constitutional: She appears well-developed and  well-nourished.  HENT:  Head: Normocephalic and atraumatic.  No oral lesions.  Eyes: Conjunctivae and EOM are normal. Right eye exhibits no discharge. Left eye exhibits no discharge. No scleral icterus.  Pulmonary/Chest: Effort normal.  Neurological: She is alert.  Skin: Skin is warm and dry. Capillary refill takes less than 2 seconds.  Rash noted to left anterior chest wall consistent with EKG lead placement.  Diffusely scattered erythematous, maculopapular rash noted to anterior aspect of right and left upper extremity.  She also has 1, 0.5 cm area that is consistent with an insect bite.  No surrounding warmth, erythema, induration.  No active drainage.  Patient also with a small 0.5 cm area consistent with insect bite noted to the back.  No surrounding warmth, erythema, induration, drainage.  No rash noted to palms or soles of feet.   Psychiatric: She has a normal mood and affect. Her speech is normal and behavior is normal.  Nursing note and vitals reviewed.    ED Treatments / Results  Labs (all labs ordered are listed, but only abnormal results are displayed) Labs Reviewed - No data to display  EKG None  Radiology No results found.  Procedures Procedures (including critical care time)  Medications  Ordered in ED Medications - No data to display   Initial Impression / Assessment and Plan / ED Course  I have reviewed the triage vital signs and the nursing notes.  Pertinent labs & imaging results that were available during my care of the patient were reviewed by me and considered in my medical decision making (see chart for details).     47 year old female who presents for evaluation of intermittent rash has been ongoing for several weeks.  Reports that she will have improvement in rash and will go to her sister's house and feels like she gets insect bites that make the rash return.  Also reports rash to where the EKG leads were when she was hospitalized.  Does have a history of allergy to adhesive tape. Patient is afebrile, non-toxic appearing, sitting comfortably on examination table. Vital signs reviewed and stable.  On exam, she has scattered erythematous micropapular rash noted to the anterior aspect of bilateral upper extremity's as well as several scattered insect bites.  She also has a rash where the EKG adhesive tape was placed.  Bites do not appear infectious in nature as there is no surrounding warmth, erythema, induration, purulent drainage.  History/physical exam is not concerning for abscess, shingles, SJS, TENS.  Suspect this is allergic reaction as well as blood bites.  History/physical exam is not concerning for scabies.  Discussed with patient regarding symptom medic at home supportive measures.  We will additionally give her a short course of hydrocortisone cream to help with symptomatically.  Encourage primary care follow-up. Patient had ample opportunity for questions and discussion. All patient's questions were answered with full understanding. Strict return precautions discussed. Patient expresses understanding and agreement to plan.   Final Clinical Impressions(s) / ED Diagnoses   Final diagnoses:  Insect bite, unspecified site, initial encounter  Rash    ED Discharge  Orders         Ordered    hydrocortisone cream 1 %  Status:  Discontinued     08/29/17 1317    hydrocortisone cream 1 %     08/29/17 1318           Rosana Hoes 08/29/17 2150    Tilden Fossa, MD 08/31/17 1343

## 2017-09-24 ENCOUNTER — Other Ambulatory Visit: Payer: Self-pay

## 2017-09-24 ENCOUNTER — Emergency Department (HOSPITAL_COMMUNITY)
Admission: EM | Admit: 2017-09-24 | Discharge: 2017-09-24 | Disposition: A | Discharge status: Home / Self Care | Payer: Medicaid Other | Attending: Emergency Medicine | Admitting: Emergency Medicine

## 2017-09-24 ENCOUNTER — Encounter (HOSPITAL_COMMUNITY): Payer: Self-pay

## 2017-09-24 DIAGNOSIS — J45909 Unspecified asthma, uncomplicated: Secondary | ICD-10-CM | POA: Diagnosis not present

## 2017-09-24 DIAGNOSIS — I10 Essential (primary) hypertension: Secondary | ICD-10-CM | POA: Diagnosis not present

## 2017-09-24 DIAGNOSIS — Z9884 Bariatric surgery status: Secondary | ICD-10-CM | POA: Insufficient documentation

## 2017-09-24 DIAGNOSIS — B86 Scabies: Secondary | ICD-10-CM | POA: Diagnosis not present

## 2017-09-24 DIAGNOSIS — R21 Rash and other nonspecific skin eruption: Secondary | ICD-10-CM | POA: Diagnosis not present

## 2017-09-24 DIAGNOSIS — E119 Type 2 diabetes mellitus without complications: Secondary | ICD-10-CM | POA: Diagnosis not present

## 2017-09-24 MED ORDER — PERMETHRIN 5 % EX CREA: TOPICAL_CREAM | CUTANEOUS | 1 refills | Status: DC

## 2017-09-24 MED ORDER — DIPHENHYDRAMINE HCL 25 MG PO TABS: 25.0000 mg | ORAL_TABLET | Freq: Four times a day (QID) | ORAL | 0 refills | Status: DC

## 2017-09-24 MED ORDER — DIPHENHYDRAMINE HCL 25 MG PO CAPS
50.0000 mg | ORAL_CAPSULE | Freq: Once | ORAL | Status: AC
  Administered 2017-09-24: 50 mg via ORAL
  Filled 2017-09-24: qty 2

## 2017-09-24 NOTE — ED Triage Notes (Signed)
Patient c/o rash on all extremities, abdomen and back x 3 weeks.

## 2017-09-24 NOTE — ED Provider Notes (Signed)
[facility] Emergency Department Provider Note MRN:  [id number]  Arrival date & time: 09/24/17     Chief Complaint   Rash   History of Present Illness   Melanie Hess is a 47 y.o. year-old female with a history of diabetes, gastric bypass presenting to the ED with chief complaint of rash.  2 to 3 days of raised red rash to the abdomen, back, arms and legs.  Gradual onset, slowly getting worse.  Described as itchy.  Lives with sister, who has had issues with bedbugs in the past.  Denies fever, no shortness of breath, no diarrhea, no recent medication changes.  No known allergies, no new detergents.  Review of Systems  A complete 10 system review of systems was obtained and all systems are negative except as noted in the HPI and PMH.   Patient's Health History    Past Medical History:  Diagnosis Date  . Ankle fracture, right 2001  . Anxiety   . Arthritis   . Asthma   . Carpal tunnel syndrome    Bilateral  . Diabetes mellitus without complication (HCC)    Type II  . GERD (gastroesophageal reflux disease)   . Hypertension   . Migraine   . OSA (obstructive sleep apnea)   . Plantar fasciitis of right foot   . Pneumonia    hx  . Shingles 11/2015  . Shortness of breath dyspnea    with walking short distances    Past Surgical History:  Procedure Laterality Date  . CARPAL TUNNEL RELEASE Left   . GASTRIC BYPASS    . HEEL SPUR RESECTION Right 08/01/2015   Procedure: HEEL SPUR EXCISIONS;  Surgeon: Marcene Corning, MD;  Location: [facility] OR;  Service: Orthopedics;  Laterality: Right;  . HEEL SPUR RESECTION Left 01/27/2016   Procedure: LEFT HEEL SPUR EXCISION AND TENDON ACHILLIES REPAIR;  Surgeon: Marcene Corning, MD;  Location: [facility] OR;  Service: Orthopedics;  Laterality: Left;  PRONE POSITION  . TUBAL LIGATION      Family History  Problem Relation Age of Onset  . Hypertension Mother   . Pulmonary embolism Mother   . Depression Mother   . Breast cancer  Maternal Aunt   . Brain cancer Cousin   . CAD Maternal Grandmother   . Microcephaly Maternal Grandmother   . Heart attack Maternal Grandmother     Social History   Socioeconomic History  . Marital status: Widowed    Spouse name: Not on file  . Number of children: 2  . Years of education: Not on file  . Highest education level: Not on file  Occupational History  . Occupation: Building surveyor  Social Needs  . Financial resource strain: Not hard at all  . Food insecurity:    Worry: Never true    Inability: Never true  . Transportation needs:    Medical: No    Non-medical: No  Tobacco Use  . Smoking status: Never Smoker  . Smokeless tobacco: Never Used  . Tobacco comment: years ago may have smoked 1 cig or less a week  Substance and Sexual Activity  . Alcohol use: No    Alcohol/week: 0.0 standard drinks  . Drug use: No  . Sexual activity: Not on file  Lifestyle  . Physical activity:    Days per week: 5 days    Minutes per session: 30 min  . Stress: Very much  Relationships  . Social connections:    Talks on phone: More than three  times a week    Gets together: Once a week    Attends religious service: More than 4 times per year    Active member of club or organization: No    Attends meetings of clubs or organizations: Never    Relationship status: Widowed  . Intimate partner violence:    Fear of current or ex partner: No    Emotionally abused: No    Physically abused: No    Forced sexual activity: No  Other Topics Concern  . Not on file  Social History Narrative  . Not on file     Physical Exam  Vital Signs and Nursing Notes reviewed Vitals:   09/24/17 0822 09/24/17 0835  BP: (!) 146/113 (!) 133/54  Pulse: 74 60  Resp: 18 14  Temp: 98.1 F (36.7 C) 98.2 F (36.8 C)  SpO2: 97% 99%    CONSTITUTIONAL: Well-appearing, NAD NEURO:  Alert and oriented x 3, no focal deficits EYES:  eyes equal and reactive ENT/NECK:  no LAD, no JVD CARDIO: Regular rate,  well-perfused, normal S1 and S2 PULM:  CTAB no wheezing or rhonchi GI/GU:  normal bowel sounds, non-distended, non-tender MSK/SPINE:  No gross deformities, no edema SKIN: Scattered erythematous small papules to the arms, legs, torso. PSYCH:  Appropriate speech and behavior  Diagnostic and Interventional Summary    EKG Interpretation  Date/Time:    Ventricular Rate:    PR Interval:    QRS Duration:   QT Interval:    QTC Calculation:   R Axis:     Text Interpretation:        Labs Reviewed - No data to display  No orders to display    Medications  diphenhydrAMINE (BENADRYL) capsule 50 mg (50 mg Oral Given 09/24/17 4098)     Procedures Critical Care  ED Course and Medical Decision Making  I have reviewed the triage vital signs and the nursing notes.  Pertinent labs & imaging results that were available during my care of the patient were reviewed by me and considered in my medical decision making (see below for details).    Concern for bedbugs versus scabies this 47 year old female with no shortness of breath, vomiting, diarrhea to suggest severe allergic process.  Recent gastric bypass, abdomen soft, no issues.  Vital signs stable.  Will provide permethrin.  After the discussed management above, the patient was determined to be safe for discharge.  The patient was in agreement with this plan and all questions regarding their care were answered.  ED return precautions were discussed and the patient will return to the ED with any significant worsening of condition.  Elmer Sow. Pilar Plate, MD [facility] Emergency Medicine [facility] [email or web address]  Final Clinical Impressions(s) / ED Diagnoses     ICD-10-CM   1. Rash R21   2. Scabies B86     ED Discharge Orders         Ordered    permethrin (ELIMITE) 5 % cream     09/24/17 0930    diphenhydrAMINE (BENADRYL) 25 MG tablet  Every 6 hours     09/24/17 0930             Sabas Sous, MD 09/24/17  [phone]

## 2017-09-24 NOTE — Discharge Instructions (Addendum)
You were evaluated in the Emergency Department and after careful evaluation, we did not find any emergent condition requiring admission or further testing in the hospital.  Your symptoms today seem to be due to scabies.  Please use the cream provided as directed and wash all of your linens and sheets with hot water as discussed.  Please return to the Emergency Department if you experience any worsening of your condition.  We encourage you to follow up with a primary care provider.  Thank you for allowing [place] to be a part of your care.

## 2017-09-28 ENCOUNTER — Encounter (HOSPITAL_COMMUNITY): Payer: Self-pay | Admitting: Psychiatry

## 2018-01-08 ENCOUNTER — Encounter (HOSPITAL_COMMUNITY): Payer: Self-pay | Admitting: Emergency Medicine

## 2018-01-08 ENCOUNTER — Other Ambulatory Visit (HOSPITAL_COMMUNITY): Payer: Self-pay | Admitting: Psychiatry

## 2018-01-08 ENCOUNTER — Emergency Department (HOSPITAL_COMMUNITY)
Admission: EM | Admit: 2018-01-08 | Discharge: 2018-01-08 | Discharge status: Against Medical Advice | Payer: Medicaid Other | Attending: Emergency Medicine | Admitting: Emergency Medicine

## 2018-01-08 DIAGNOSIS — Z5321 Procedure and treatment not carried out due to patient leaving prior to being seen by health care provider: Secondary | ICD-10-CM | POA: Diagnosis not present

## 2018-01-08 DIAGNOSIS — M545 Low back pain: Secondary | ICD-10-CM | POA: Diagnosis not present

## 2018-01-08 DIAGNOSIS — F4310 Post-traumatic stress disorder, unspecified: Secondary | ICD-10-CM

## 2018-01-08 DIAGNOSIS — R079 Chest pain, unspecified: Secondary | ICD-10-CM | POA: Diagnosis not present

## 2018-01-08 DIAGNOSIS — R0981 Nasal congestion: Secondary | ICD-10-CM | POA: Insufficient documentation

## 2018-01-08 DIAGNOSIS — J45909 Unspecified asthma, uncomplicated: Secondary | ICD-10-CM | POA: Insufficient documentation

## 2018-01-08 DIAGNOSIS — R05 Cough: Secondary | ICD-10-CM | POA: Insufficient documentation

## 2018-01-08 NOTE — ED Triage Notes (Signed)
Pt reports has asthma reports out of her medications. Pt reports that she has nasal congestion and cough with back and chest pains.

## 2018-01-09 ENCOUNTER — Other Ambulatory Visit: Payer: Self-pay

## 2018-01-09 ENCOUNTER — Emergency Department (HOSPITAL_COMMUNITY)
Admission: EM | Admit: 2018-01-09 | Discharge: 2018-01-10 | Disposition: A | Discharge status: Home / Self Care | Payer: Medicaid Other | Attending: Emergency Medicine | Admitting: Emergency Medicine

## 2018-01-09 ENCOUNTER — Emergency Department (HOSPITAL_COMMUNITY): Payer: Medicaid Other

## 2018-01-09 ENCOUNTER — Encounter (HOSPITAL_COMMUNITY): Payer: Self-pay

## 2018-01-09 DIAGNOSIS — Z87891 Personal history of nicotine dependence: Secondary | ICD-10-CM | POA: Insufficient documentation

## 2018-01-09 DIAGNOSIS — I10 Essential (primary) hypertension: Secondary | ICD-10-CM | POA: Diagnosis not present

## 2018-01-09 DIAGNOSIS — J45909 Unspecified asthma, uncomplicated: Secondary | ICD-10-CM | POA: Diagnosis not present

## 2018-01-09 DIAGNOSIS — R0602 Shortness of breath: Secondary | ICD-10-CM | POA: Diagnosis not present

## 2018-01-09 DIAGNOSIS — R05 Cough: Secondary | ICD-10-CM | POA: Insufficient documentation

## 2018-01-09 DIAGNOSIS — E119 Type 2 diabetes mellitus without complications: Secondary | ICD-10-CM | POA: Diagnosis not present

## 2018-01-09 DIAGNOSIS — R079 Chest pain, unspecified: Secondary | ICD-10-CM | POA: Diagnosis not present

## 2018-01-09 DIAGNOSIS — J3489 Other specified disorders of nose and nasal sinuses: Secondary | ICD-10-CM | POA: Diagnosis not present

## 2018-01-09 DIAGNOSIS — R42 Dizziness and giddiness: Secondary | ICD-10-CM | POA: Diagnosis not present

## 2018-01-09 DIAGNOSIS — R110 Nausea: Secondary | ICD-10-CM | POA: Insufficient documentation

## 2018-01-09 LAB — BASIC METABOLIC PANEL
Anion gap: 8 (ref 5–15)
BUN: 12 mg/dL (ref 6–20)
CALCIUM: 8.9 mg/dL (ref 8.9–10.3)
CO2: 26 mmol/L (ref 22–32)
CREATININE: 0.97 mg/dL (ref 0.44–1.00)
Chloride: 108 mmol/L (ref 98–111)
GFR calc non Af Amer: >60 mL/min (ref >60)
Glucose, Bld: 92 mg/dL (ref 70–99)
Potassium: 3.5 mmol/L (ref 3.5–5.1)
SODIUM: 142 mmol/L (ref 135–145)

## 2018-01-09 LAB — CBC
HCT: 37.1 % (ref 36.0–46.0)
Hemoglobin: 11.8 g/dL — ABNORMAL LOW (ref 12.0–15.0)
MCH: 28.6 pg (ref 26.0–34.0)
MCHC: 31.8 g/dL (ref 30.0–36.0)
MCV: 90 fL (ref 80.0–100.0)
PLATELETS: 206 10*3/uL (ref 150–400)
RBC: 4.12 MIL/uL (ref 3.87–5.11)
RDW: 15.4 % (ref 11.5–15.5)
WBC: 5.1 10*3/uL (ref 4.0–10.5)
nRBC: 0 % (ref 0.0–0.2)

## 2018-01-09 LAB — POCT I-STAT TROPONIN I
Troponin i, poc: 0 ng/mL (ref 0.00–0.08)
Troponin i, poc: 0 ng/mL (ref 0.00–0.08)

## 2018-01-09 LAB — D-DIMER, QUANTITATIVE: D-Dimer, Quant: 0.58 ug/mL-FEU — ABNORMAL HIGH (ref 0.00–0.50)

## 2018-01-09 MED ORDER — LIDOCAINE VISCOUS HCL 2 % MT SOLN
15.0000 mL | Freq: Once | OROMUCOSAL | Status: AC
  Administered 2018-01-09: 15 mL via ORAL
  Filled 2018-01-09: qty 15

## 2018-01-09 MED ORDER — SODIUM CHLORIDE (PF) 0.9 % IJ SOLN
INTRAMUSCULAR | Status: AC
  Filled 2018-01-09: qty 50

## 2018-01-09 MED ORDER — FENTANYL CITRATE (PF) 100 MCG/2ML IJ SOLN
25.0000 ug | Freq: Once | INTRAMUSCULAR | Status: AC
  Administered 2018-01-09: 25 ug via INTRAVENOUS
  Filled 2018-01-09: qty 2

## 2018-01-09 MED ORDER — ALUM & MAG HYDROXIDE-SIMETH 200-200-20 MG/5ML PO SUSP
30.0000 mL | Freq: Once | ORAL | Status: AC
  Administered 2018-01-09: 30 mL via ORAL
  Filled 2018-01-09: qty 30

## 2018-01-09 MED ORDER — IOPAMIDOL (ISOVUE-370) INJECTION 76%
INTRAVENOUS | Status: AC
  Filled 2018-01-09: qty 100

## 2018-01-09 NOTE — ED Triage Notes (Signed)
Patient states she has had mid chest pain and SOB x 2 days. Patient states she was in the ED yesterday, but left prior to being in triage. Patient reports that she took an albuterol neb treatment at 1641 today. Patient reports a non productive cough x 2 days.

## 2018-01-09 NOTE — ED Provider Notes (Signed)
[facility] Emergency Department Provider Note MRN:  [id number]  Arrival date & time: 01/09/18     Chief Complaint   Chest Pain; Shortness of Breath; and Cough   History of Present Illness   Phillips OdorCheryl A Diaz-Ramirez is a 47 y.o. year-old female with a history of diabetes, hypertension, asthma presenting to the ED with chief complaint of chest pain, shortness of breath.  2 to 3 days of runny nose and cough.  1 to 2 days of chest tightness and shortness of breath.  Described as a sharp tightness in the center of the chest, radiating to the mid thoracic back.  Felt better with breathing treatment earlier today.  Denies headache or vision change.  Endorsing lightheadedness and nausea with the pain.  No abdominal pain, no leg pain or swelling.  Patient endorses personal history of DVT.  Review of Systems  A complete 10 system review of systems was obtained and all systems are negative except as noted in the HPI and PMH.   Patient's Health History    Past Medical History:  Diagnosis Date  . Ankle fracture, right 2001  . Anxiety   . Arthritis   . Asthma   . Carpal tunnel syndrome    Bilateral  . Diabetes mellitus without complication (HCC)    Type II  . GERD (gastroesophageal reflux disease)   . Hypertension   . Migraine   . OSA (obstructive sleep apnea)   . Plantar fasciitis of right foot   . Pneumonia    hx  . Shingles 11/2015  . Shortness of breath dyspnea    with walking short distances    Past Surgical History:  Procedure Laterality Date  . CARPAL TUNNEL RELEASE Left   . GASTRIC BYPASS    . HEEL SPUR RESECTION Right 08/01/2015   Procedure: HEEL SPUR EXCISIONS;  Surgeon: Marcene CorningPeter Dalldorf, MD;  [facility];  Service: Orthopedics;  Laterality: Right;  . HEEL SPUR RESECTION Left 01/27/2016   Procedure: LEFT HEEL SPUR EXCISION AND TENDON ACHILLIES REPAIR;  Surgeon: Marcene CorningPeter Dalldorf, MD;  Location: [facility] OR;  Service: Orthopedics;  Laterality: Left;  PRONE POSITION    . TUBAL LIGATION      Family History  Problem Relation Age of Onset  . Hypertension Mother   . Pulmonary embolism Mother   . Depression Mother   . Breast cancer Maternal Aunt   . Brain cancer Cousin   . CAD Maternal Grandmother   . Microcephaly Maternal Grandmother   . Heart attack Maternal Grandmother     Social History   Socioeconomic History  . Marital status: Widowed    Spouse name: Not on file  . Number of children: 2  . Years of education: Not on file  . Highest education level: Not on file  Occupational History  . Occupation: Building surveyordaycare teacher  Social Needs  . Financial resource strain: Not hard at all  . Food insecurity:    Worry: Never true    Inability: Never true  . Transportation needs:    Medical: No    Non-medical: No  Tobacco Use  . Smoking status: Never Smoker  . Smokeless tobacco: Never Used  . Tobacco comment: years ago may have smoked 1 cig or less a week  Substance and Sexual Activity  . Alcohol use: No    Alcohol/week: 0.0 standard drinks  . Drug use: No  . Sexual activity: Not on file  Lifestyle  . Physical activity:    Days per week:  5 days    Minutes per session: 30 min  . Stress: Very much  Relationships  . Social connections:    Talks on phone: More than three times a week    Gets together: Once a week    Attends religious service: More than 4 times per year    Active member of club or organization: No    Attends meetings of clubs or organizations: Never    Relationship status: Widowed  . Intimate partner violence:    Fear of current or ex partner: No    Emotionally abused: No    Physically abused: No    Forced sexual activity: No  Other Topics Concern  . Not on file  Social History Narrative  . Not on file     Physical Exam  Vital Signs and Nursing Notes reviewed Vitals:   01/09/18 2202 01/09/18 2230  BP: (!) 152/97 (!) 153/96  Pulse: (!) 48 (!) 57  Resp: 15 (!) 8  Temp:    SpO2: 99% 96%    CONSTITUTIONAL:  Well-appearing, NAD NEURO:  Alert and oriented x 3, no focal deficits EYES:  eyes equal and reactive ENT/NECK:  no LAD, no JVD CARDIO: Regular rate, well-perfused, normal S1 and S2 PULM:  CTAB no wheezing or rhonchi GI/GU:  normal bowel sounds, non-distended, non-tender MSK/SPINE:  No gross deformities, no edema SKIN:  no rash, atraumatic PSYCH:  Appropriate speech and behavior  Diagnostic and Interventional Summary    EKG Interpretation  Date/Time:  Monday January 09 2018 18:35:06 EST Ventricular Rate:  59 PR Interval:    QRS Duration: 99 QT Interval:  461 QTC Calculation: 457 R Axis:   29 Text Interpretation:  Sinus rhythm Confirmed by Kennis Carina [phone]) on 01/09/2018 7:03:59 PM      Labs Reviewed  CBC - Abnormal; Notable for the following components:      Result Value   Hemoglobin 11.8 (*)    All other components within normal limits  D-DIMER, QUANTITATIVE (NOT AT [facility]) - Abnormal; Notable for the following components:   D-Dimer, Quant 0.58 (*)    All other components within normal limits  BASIC METABOLIC PANEL  I-STAT TROPONIN, ED  POCT I-STAT TROPONIN I  I-STAT BETA HCG BLOOD, ED ([facility], [facility], [facility] ONLY)  I-STAT TROPONIN, ED    DG Chest 2 View  Final Result    CT Angio Chest PE W and/or Wo Contrast    (Results Pending)    Medications  sodium chloride (PF) 0.9 % injection (has no administration in time range)  iopamidol (ISOVUE-370) 76 % injection (has no administration in time range)  fentaNYL (SUBLIMAZE) injection 25 mcg (25 mcg Intravenous Given 01/09/18 2005)  alum & mag hydroxide-simeth (MAALOX/MYLANTA) 200-200-20 MG/5ML suspension 30 mL (30 mLs Oral Given 01/09/18 2005)    And  lidocaine (XYLOCAINE) 2 % viscous mouth solution 15 mL (15 mLs Oral Given 01/09/18 2004)     Procedures Critical Care  ED Course and Medical Decision Making  I have reviewed the triage vital signs and the nursing notes.  Pertinent labs & imaging results that were available  during my care of the patient were reviewed by me and considered in my medical decision making (see below for details).  Favoring asthma exacerbation versus GI etiology in this 47 year old female, though she does have risk factors for cardiac disease.  Will evaluate with troponin, d-dimer as well given her personal history of DVT.  CTA chest still pending.  Second troponin pending.  If  both negative appropriate for discharge.  Favoring asthma versus GI etiology versus MSK given that pain is worse with motion, was improved after breathing treatment.  Signed out to Dr. Preston FleetingGlick at shift change.  Elmer SowMichael M. Pilar PlateBero, MD [facility] Emergency Medicine [facility] [email or web address]  Final Clinical Impressions(s) / ED Diagnoses     ICD-10-CM   1. Chest pain, unspecified type R07.9     ED Discharge Orders    None         Sabas SousBero, Butch Otterson M, MD 01/09/18 2344

## 2018-01-10 ENCOUNTER — Emergency Department (HOSPITAL_COMMUNITY): Payer: Medicaid Other

## 2018-01-10 ENCOUNTER — Encounter (HOSPITAL_COMMUNITY): Payer: Self-pay

## 2018-01-10 MED ORDER — PANTOPRAZOLE SODIUM 40 MG PO TBEC
40.0000 mg | DELAYED_RELEASE_TABLET | Freq: Once | ORAL | Status: AC
  Administered 2018-01-10: 40 mg via ORAL
  Filled 2018-01-10: qty 1

## 2018-01-10 MED ORDER — IOPAMIDOL (ISOVUE-370) INJECTION 76%
100.0000 mL | Freq: Once | INTRAVENOUS | Status: AC | PRN
  Administered 2018-01-10: 100 mL via INTRAVENOUS

## 2018-01-10 MED ORDER — HYDROMORPHONE HCL 1 MG/ML IJ SOLN
0.5000 mg | Freq: Once | INTRAMUSCULAR | Status: AC
  Administered 2018-01-10: 0.5 mg via INTRAVENOUS
  Filled 2018-01-10: qty 1

## 2018-01-10 MED ORDER — PANTOPRAZOLE SODIUM 40 MG PO TBEC: 40.0000 mg | DELAYED_RELEASE_TABLET | Freq: Every day | ORAL | 0 refills | Status: DC

## 2018-01-10 MED ORDER — ONDANSETRON HCL 4 MG/2ML IJ SOLN
4.0000 mg | Freq: Once | INTRAMUSCULAR | Status: AC
  Administered 2018-01-10: 4 mg via INTRAVENOUS
  Filled 2018-01-10: qty 2

## 2018-01-10 NOTE — Discharge Instructions (Signed)
Take antacids as needed.  Return if symptoms are getting worse. 

## 2018-01-10 NOTE — ED Provider Notes (Signed)
Care assumed from Dr. Pilar PlateBero, patient with chest pain and positive d-dimer pending repeat troponin and CT angiogram of chest.  Repeat troponin is undetectable.  Patient had expressed desire to leave before getting the CT angiogram.  I have sent down to explain the importance of CT angiogram and she is now willing to stay.  Of note, she did have temporary relief of her discomfort with GI cocktail and she is given a dose of pantoprazole.  CT angiogram shows no evidence of pulmonary embolism or pneumonia.  Pain seems to have been secondary to GERD.  She is discharged with prescription for pantoprazole, told to use over-the-counter antacids as needed.  Return precautions discussed.  Follow-up with PCP in 1 week.  Results for orders placed or performed during the hospital encounter of 01/09/18  Basic metabolic panel  Result Value Ref Range   Sodium 142 135 - 145 mmol/L   Potassium 3.5 3.5 - 5.1 mmol/L   Chloride 108 98 - 111 mmol/L   CO2 26 22 - 32 mmol/L   Glucose, Bld 92 70 - 99 mg/dL   BUN 12 6 - 20 mg/dL   Creatinine, Ser 8.110.97 0.44 - 1.00 mg/dL   Calcium 8.9 8.9 - 91.410.3 mg/dL   GFR calc non Af Amer >60 >60 mL/min   GFR calc Af Amer >60 >60 mL/min   Anion gap 8 5 - 15  CBC  Result Value Ref Range   WBC 5.1 4.0 - 10.5 K/uL   RBC 4.12 3.87 - 5.11 MIL/uL   Hemoglobin 11.8 (L) 12.0 - 15.0 g/dL   HCT 78.237.1 95.636.0 - 21.346.0 %   MCV 90.0 80.0 - 100.0 fL   MCH 28.6 26.0 - 34.0 pg   MCHC 31.8 30.0 - 36.0 g/dL   RDW 08.615.4 57.811.5 - 46.915.5 %   Platelets 206 150 - 400 K/uL   nRBC 0.0 0.0 - 0.2 %  D-dimer, quantitative (not at [facility])  Result Value Ref Range   D-Dimer, Quant 0.58 (H) 0.00 - 0.50 ug/mL-FEU  POCT i-Stat troponin I  Result Value Ref Range   Troponin i, poc 0.00 0.00 - 0.08 ng/mL   Comment 3          POCT i-Stat troponin I  Result Value Ref Range   Troponin i, poc 0.00 0.00 - 0.08 ng/mL   Comment 3           Dg Chest 2 View  Result Date: 01/09/2018 CLINICAL DATA:  Chest pain, shortness of  breath EXAM: CHEST - 2 VIEW COMPARISON:  08/21/2017 FINDINGS: Lungs are clear.  No pleural effusion or pneumothorax. The heart is normal in size. Visualized osseous structures are within normal limits. IMPRESSION: Normal chest radiographs. Electronically Signed   By: Charline BillsSriyesh  Krishnan M.D.   On: 01/09/2018 19:18   Ct Angio Chest Pe W And/or Wo Contrast  Result Date: 01/10/2018 CLINICAL DATA:  Cough and chest pain EXAM: CT ANGIOGRAPHY CHEST WITH CONTRAST TECHNIQUE: Multidetector CT imaging of the chest was performed using the standard protocol during bolus administration of intravenous contrast. Multiplanar CT image reconstructions and MIPs were obtained to evaluate the vascular anatomy. CONTRAST:  100mL ISOVUE-370 IOPAMIDOL (ISOVUE-370) INJECTION 76% COMPARISON:  Plain film from earlier in the same day. FINDINGS: Cardiovascular: Thoracic aorta is within normal limits. No coronary calcifications are seen. No cardiac enlargement is noted. Pulmonary artery is well visualized within normal branching pattern. No focal filling defect to suggest pulmonary embolism is identified. Mediastinum/Nodes: Thoracic inlet is within normal  limits. No sizable hilar or mediastinal adenopathy is noted. The esophagus is fluid-filled which may be in part due to reflux. Postsurgical changes are seen about the gastroesophageal junction. Lungs/Pleura: Lungs are well aerated bilaterally without focal infiltrate or sizable effusion. Mild atelectatic changes are seen. No parenchymal nodules are noted. Upper Abdomen: No acute abnormality. Musculoskeletal: No chest wall abnormality. No acute or significant osseous findings. Review of the MIP images confirms the above findings. IMPRESSION: No evidence of pulmonary emboli. Fluid-filled esophagus which may be related to reflux. No other focal abnormality is noted. Electronically Signed   By: Alcide CleverMark  Lukens M.D.   On: 01/10/2018 02:06   Images viewed by me.    Dione BoozeGlick, Latice Waitman, MD 01/10/18  Emeline [id number]

## 2018-02-13 ENCOUNTER — Observation Stay (HOSPITAL_COMMUNITY)
Admission: EM | Admit: 2018-02-13 | Discharge: 2018-02-14 | Disposition: A | Discharge status: Home / Self Care | Payer: Medicaid Other | Attending: Internal Medicine | Admitting: Internal Medicine

## 2018-02-13 ENCOUNTER — Encounter (HOSPITAL_COMMUNITY): Payer: Self-pay

## 2018-02-13 ENCOUNTER — Other Ambulatory Visit: Payer: Self-pay

## 2018-02-13 ENCOUNTER — Emergency Department (HOSPITAL_COMMUNITY): Payer: Medicaid Other

## 2018-02-13 DIAGNOSIS — E119 Type 2 diabetes mellitus without complications: Secondary | ICD-10-CM

## 2018-02-13 DIAGNOSIS — I10 Essential (primary) hypertension: Secondary | ICD-10-CM | POA: Diagnosis not present

## 2018-02-13 DIAGNOSIS — G4733 Obstructive sleep apnea (adult) (pediatric): Secondary | ICD-10-CM | POA: Insufficient documentation

## 2018-02-13 DIAGNOSIS — J4540 Moderate persistent asthma, uncomplicated: Secondary | ICD-10-CM | POA: Diagnosis present

## 2018-02-13 DIAGNOSIS — K224 Dyskinesia of esophagus: Secondary | ICD-10-CM | POA: Insufficient documentation

## 2018-02-13 DIAGNOSIS — E869 Volume depletion, unspecified: Secondary | ICD-10-CM | POA: Diagnosis not present

## 2018-02-13 DIAGNOSIS — D649 Anemia, unspecified: Secondary | ICD-10-CM | POA: Diagnosis present

## 2018-02-13 DIAGNOSIS — R55 Syncope and collapse: Secondary | ICD-10-CM | POA: Diagnosis not present

## 2018-02-13 DIAGNOSIS — G5600 Carpal tunnel syndrome, unspecified upper limb: Secondary | ICD-10-CM | POA: Insufficient documentation

## 2018-02-13 DIAGNOSIS — E039 Hypothyroidism, unspecified: Secondary | ICD-10-CM | POA: Insufficient documentation

## 2018-02-13 DIAGNOSIS — F419 Anxiety disorder, unspecified: Secondary | ICD-10-CM | POA: Insufficient documentation

## 2018-02-13 DIAGNOSIS — R001 Bradycardia, unspecified: Secondary | ICD-10-CM | POA: Diagnosis not present

## 2018-02-13 DIAGNOSIS — Z9884 Bariatric surgery status: Secondary | ICD-10-CM | POA: Insufficient documentation

## 2018-02-13 DIAGNOSIS — Z794 Long term (current) use of insulin: Secondary | ICD-10-CM | POA: Insufficient documentation

## 2018-02-13 DIAGNOSIS — K219 Gastro-esophageal reflux disease without esophagitis: Secondary | ICD-10-CM | POA: Diagnosis not present

## 2018-02-13 DIAGNOSIS — Z79899 Other long term (current) drug therapy: Secondary | ICD-10-CM | POA: Diagnosis not present

## 2018-02-13 DIAGNOSIS — Z6841 Body Mass Index (BMI) 40.0 and over, adult: Secondary | ICD-10-CM | POA: Diagnosis not present

## 2018-02-13 DIAGNOSIS — E876 Hypokalemia: Secondary | ICD-10-CM | POA: Diagnosis not present

## 2018-02-13 HISTORY — DX: Unspecified abdominal hernia without obstruction or gangrene: K46.9

## 2018-02-13 LAB — URINALYSIS, ROUTINE W REFLEX MICROSCOPIC
Bilirubin Urine: NEGATIVE
Glucose, UA: NEGATIVE mg/dL
Ketones, ur: 5 mg/dL — AB
Leukocytes, UA: NEGATIVE
Nitrite: NEGATIVE
Protein, ur: NEGATIVE mg/dL
Specific Gravity, Urine: 1.023 (ref 1.005–1.030)
pH: 5 (ref 5.0–8.0)

## 2018-02-13 LAB — CBC WITH DIFFERENTIAL/PLATELET
Abs Immature Granulocytes: 0.01 10*3/uL (ref 0.00–0.07)
BASOS ABS: 0 10*3/uL (ref 0.0–0.1)
Basophils Relative: 0 %
EOS PCT: 3 %
Eosinophils Absolute: 0.1 10*3/uL (ref 0.0–0.5)
HEMATOCRIT: 37.4 % (ref 36.0–46.0)
Hemoglobin: 11.9 g/dL — ABNORMAL LOW (ref 12.0–15.0)
Immature Granulocytes: 0 %
Lymphocytes Relative: 42 %
Lymphs Abs: 1.5 10*3/uL (ref 0.7–4.0)
MCH: 27.9 pg (ref 26.0–34.0)
MCHC: 31.8 g/dL (ref 30.0–36.0)
MCV: 87.8 fL (ref 80.0–100.0)
Monocytes Absolute: 0.4 10*3/uL (ref 0.1–1.0)
Monocytes Relative: 10 %
Neutro Abs: 1.6 10*3/uL — ABNORMAL LOW (ref 1.7–7.7)
Neutrophils Relative %: 45 %
Platelets: 194 10*3/uL (ref 150–400)
RBC: 4.26 MIL/uL (ref 3.87–5.11)
RDW: 15.5 % (ref 11.5–15.5)
WBC: 3.6 10*3/uL — ABNORMAL LOW (ref 4.0–10.5)
nRBC: 0 % (ref 0.0–0.2)

## 2018-02-13 LAB — CBC
HCT: 36.6 % (ref 36.0–46.0)
Hemoglobin: 11.6 g/dL — ABNORMAL LOW (ref 12.0–15.0)
MCH: 27.7 pg (ref 26.0–34.0)
MCHC: 31.7 g/dL (ref 30.0–36.0)
MCV: 87.4 fL (ref 80.0–100.0)
Platelets: 202 10*3/uL (ref 150–400)
RBC: 4.19 MIL/uL (ref 3.87–5.11)
RDW: 15.2 % (ref 11.5–15.5)
WBC: 4.8 10*3/uL (ref 4.0–10.5)
nRBC: 0 % (ref 0.0–0.2)

## 2018-02-13 LAB — COMPREHENSIVE METABOLIC PANEL
ALT: 14 U/L (ref 0–44)
AST: 15 U/L (ref 15–41)
Albumin: 3.6 g/dL (ref 3.5–5.0)
Alkaline Phosphatase: 57 U/L (ref 38–126)
Anion gap: 11 (ref 5–15)
BUN: 10 mg/dL (ref 6–20)
CO2: 22 mmol/L (ref 22–32)
Calcium: 8.9 mg/dL (ref 8.9–10.3)
Chloride: 106 mmol/L (ref 98–111)
Creatinine, Ser: 1.13 mg/dL — ABNORMAL HIGH (ref 0.44–1.00)
GFR calc Af Amer: >60 mL/min (ref >60)
GFR calc non Af Amer: 57 mL/min — ABNORMAL LOW (ref >60)
Glucose, Bld: 83 mg/dL (ref 70–99)
Potassium: 3.6 mmol/L (ref 3.5–5.1)
Sodium: 139 mmol/L (ref 135–145)
Total Bilirubin: 0.6 mg/dL (ref 0.3–1.2)
Total Protein: 6.6 g/dL (ref 6.5–8.1)

## 2018-02-13 LAB — CREATININE, SERUM
Creatinine, Ser: 0.99 mg/dL (ref 0.44–1.00)
GFR calc Af Amer: >60 mL/min (ref >60)
GFR calc non Af Amer: >60 mL/min (ref >60)

## 2018-02-13 LAB — GLUCOSE, CAPILLARY: Glucose-Capillary: 75 mg/dL (ref 70–99)

## 2018-02-13 LAB — HEMOGLOBIN A1C
Hgb A1c MFr Bld: 5.5 % (ref 4.8–5.6)
Mean Plasma Glucose: 111.15 mg/dL

## 2018-02-13 LAB — I-STAT TROPONIN, ED: Troponin i, poc: 0 ng/mL (ref 0.00–0.08)

## 2018-02-13 LAB — TSH: TSH: 1.665 u[IU]/mL (ref 0.350–4.500)

## 2018-02-13 LAB — PHOSPHORUS: Phosphorus: 3.5 mg/dL (ref 2.5–4.6)

## 2018-02-13 LAB — MAGNESIUM: Magnesium: 1.8 mg/dL (ref 1.7–2.4)

## 2018-02-13 LAB — LIPASE, BLOOD: Lipase: 29 U/L (ref 11–51)

## 2018-02-13 LAB — TROPONIN I: Troponin I: <0.03 ng/mL (ref <0.03)

## 2018-02-13 LAB — CBG MONITORING, ED
Glucose-Capillary: 77 mg/dL (ref 70–99)
Glucose-Capillary: 72 mg/dL (ref 70–99)

## 2018-02-13 LAB — T4, FREE: Free T4: 0.79 ng/dL — ABNORMAL LOW (ref 0.82–1.77)

## 2018-02-13 MED ORDER — POTASSIUM CHLORIDE IN NACL 20-0.9 MEQ/L-% IV SOLN
INTRAVENOUS | Status: DC
  Administered 2018-02-14: via INTRAVENOUS
  Filled 2018-02-13 (×3): qty 1000

## 2018-02-13 MED ORDER — SODIUM CHLORIDE 0.9 % IV BOLUS
500.0000 mL | Freq: Once | INTRAVENOUS | Status: AC
  Administered 2018-02-13: 500 mL via INTRAVENOUS

## 2018-02-13 MED ORDER — INSULIN ASPART 100 UNIT/ML ~~LOC~~ SOLN: 0.0000 [IU] | Freq: Every day | SUBCUTANEOUS | Status: DC

## 2018-02-13 MED ORDER — INSULIN ASPART 100 UNIT/ML ~~LOC~~ SOLN: 0.0000 [IU] | Freq: Three times a day (TID) | SUBCUTANEOUS | Status: DC

## 2018-02-13 MED ORDER — LORATADINE 10 MG PO TABS
10.0000 mg | ORAL_TABLET | Freq: Every day | ORAL | Status: DC
  Administered 2018-02-13 – 2018-02-14 (×2): 10 mg via ORAL
  Filled 2018-02-13 (×2): qty 1

## 2018-02-13 MED ORDER — LISINOPRIL 40 MG PO TABS
40.0000 mg | ORAL_TABLET | Freq: Every day | ORAL | Status: DC
  Administered 2018-02-13 – 2018-02-14 (×2): 40 mg via ORAL
  Filled 2018-02-13 (×2): qty 1

## 2018-02-13 MED ORDER — PANTOPRAZOLE SODIUM 40 MG PO TBEC
40.0000 mg | DELAYED_RELEASE_TABLET | Freq: Every day | ORAL | Status: DC
  Administered 2018-02-14: 40 mg via ORAL
  Filled 2018-02-13: qty 1

## 2018-02-13 MED ORDER — PREGABALIN 75 MG PO CAPS
75.0000 mg | ORAL_CAPSULE | Freq: Two times a day (BID) | ORAL | Status: DC
  Administered 2018-02-13: 75 mg via ORAL
  Filled 2018-02-13 (×2): qty 1

## 2018-02-13 MED ORDER — ALBUTEROL SULFATE (2.5 MG/3ML) 0.083% IN NEBU: 2.5000 mg | INHALATION_SOLUTION | Freq: Four times a day (QID) | RESPIRATORY_TRACT | Status: DC | PRN

## 2018-02-13 MED ORDER — ENOXAPARIN SODIUM 40 MG/0.4ML ~~LOC~~ SOLN
40.0000 mg | SUBCUTANEOUS | Status: DC
  Filled 2018-02-13 (×2): qty 0.4

## 2018-02-13 MED ORDER — ONDANSETRON HCL 4 MG/2ML IJ SOLN
4.0000 mg | Freq: Once | INTRAMUSCULAR | Status: AC
  Administered 2018-02-13: 4 mg via INTRAVENOUS
  Filled 2018-02-13: qty 2

## 2018-02-13 MED ORDER — FLUTICASONE FUROATE-VILANTEROL 200-25 MCG/INH IN AEPB
1.0000 | INHALATION_SPRAY | Freq: Every day | RESPIRATORY_TRACT | Status: DC
  Administered 2018-02-14: 1 via RESPIRATORY_TRACT
  Filled 2018-02-13: qty 28

## 2018-02-13 NOTE — ED Notes (Signed)
Pt taking po fluids and tolerating well. 

## 2018-02-13 NOTE — ED Notes (Signed)
Report attempted 

## 2018-02-13 NOTE — H&P (Signed)
History and Physical  [name] ZOX:[id number] DOB: 1970/08/08 DOA: 02/13/2018  Referring physician: ER physician PCP: Leilani Ableeese, Betti, MD  Outpatient Specialists:  Patient coming from: Home  Chief Complaint: Syncope  HPI: Patient is a 48 year old female with past medical history significant for diabetes mellitus, hypertension, morbid obesity for which patient has undergone sleeve surgery amongst other past medical history.  According to the patient, she underwent the cyst surgery around June 2019 and has lost more than 100 pounds.  Patient weighed 406 pounds prior to the surgery, and now weighs in the 200s.  Patient reported having 2 episodes of syncope while at work.  Patient described was feeling "lightheaded", spinning of the head, feeling very hot inside and eventually passing out.  Following the surgery, patient has had poor p.o. intake (both liquid and solid).  Patient seems volume depleted.  No recent history of URI symptoms or ear problems.  Patient has had syncopal episodes in the past, but no further details.  No headache, no neck pain, no fever or chills, no chest pain, no shortness of breath, no GI symptoms and no urinary symptoms.  Work-up done revealed significant bradycardia (45 bpm).  BUN is 10, with serum creatinine of 1.13.  Point-of-care troponin is 0.00.  Hemoglobin is 11.9 with hematocrit of 37.4.  Reveals specific gravity of 1.019.  Chest x-ray has not revealed any acute findings.  Patient will be admitted for further assessment and management.  ED Course: Patient was volume resuscitated.  Above work-up was done by the ER provider.  TSH has been added to patient's work-up. Pertinent labs: As documented above EKG: Independently reviewed.  Imaging: independently reviewed.   Review of Systems:  Negative for fever, visual changes, sore throat, rash, new muscle aches, chest pain, SOB, dysuria, bleeding, n/v/abdominal pain.  Past Medical History:  Diagnosis Date  .  Ankle fracture, right 2001  . Anxiety   . Arthritis   . Asthma   . Carpal tunnel syndrome    Bilateral  . Diabetes mellitus without complication (HCC)    Type II  . GERD (gastroesophageal reflux disease)   . Hypertension   . Migraine   . OSA (obstructive sleep apnea)   . Plantar fasciitis of right foot   . Pneumonia    hx  . Shingles 11/2015  . Shortness of breath dyspnea    with walking short distances    Past Surgical History:  Procedure Laterality Date  . CARPAL TUNNEL RELEASE Left   . GASTRIC BYPASS    . HEEL SPUR RESECTION Right 08/01/2015   Procedure: HEEL SPUR EXCISIONS;  Surgeon: Marcene CorningPeter Dalldorf, MD;  [facility];  Service: Orthopedics;  Laterality: Right;  . HEEL SPUR RESECTION Left 01/27/2016   Procedure: LEFT HEEL SPUR EXCISION AND TENDON ACHILLIES REPAIR;  Surgeon: Marcene CorningPeter Dalldorf, MD;  Location: [facility] OR;  Service: Orthopedics;  Laterality: Left;  PRONE POSITION  . TUBAL LIGATION       reports that she has never smoked. She has never used smokeless tobacco. She reports that she does not drink alcohol or use drugs.  Allergies  Allergen Reactions  . Aspirin Shortness Of Breath and Swelling  . Nsaids Shortness Of Breath and Swelling  . Penicillins Anaphylaxis, Swelling, Rash and Other (See Comments)    PCN reaction causing immediate rash, facial/tongue/throat swelling, SOB or lightheadedness with hypotension: YES PCN reaction causing severe rash involving mucus membranes or skin necrosis: YES PCN reaction that required hospitalization YES PCN reaction occurring within the last  10 years: NO   . Tolmetin Shortness Of Breath and Swelling  . Oxycodone-Acetaminophen Itching and Hives  . Percocet [Oxycodone-Acetaminophen] Hives and Itching  . Vicodin [Hydrocodone-Acetaminophen] Hives and Itching  . Adhesive [Tape] Itching and Rash  . Tramadol Nausea Only    Family History  Problem Relation Age of Onset  . Hypertension Mother   . Pulmonary embolism Mother   .  Depression Mother   . Breast cancer Maternal Aunt   . Brain cancer Cousin   . CAD Maternal Grandmother   . Microcephaly Maternal Grandmother   . Heart attack Maternal Grandmother      Prior to Admission medications   Medication Sig Start Date End Date Taking? Authorizing Provider  albuterol (PROVENTIL) (2.5 MG/3ML) 0.083% nebulizer solution Take 2.5 mg by nebulization every 6 (six) hours as needed for wheezing or shortness of breath.     [provider]  amLODipine (NORVASC) 5 MG tablet TAKE 1 TABLET BY MOUTH DAILY Patient not taking: Reported on 08/29/2017 07/06/17   Azalee CourseMeng, Hao, PA  BREO ELLIPTA 200-25 MCG/INH AEPB INHALE 1 PUFF INTO LUNGS DAILY Patient taking differently: Inhale 1 puff into the lungs daily.  03/30/16   Kalman Shanamaswamy, Murali, MD  cetirizine (ZYRTEC) 10 MG tablet Take 10 mg by mouth as needed for allergies.    [provider]  chlorthalidone (HYGROTON) 25 MG tablet Take 25 mg by mouth every morning.    [provider]  diphenhydrAMINE (BENADRYL) 25 MG tablet Take 1 tablet (25 mg total) by mouth every 6 (six) hours. Patient taking differently: Take 25 mg by mouth 2 (two) times daily as needed for allergies.  09/24/17   Sabas SousBero, Michael M, MD  escitalopram (LEXAPRO) 10 MG tablet TAKE 1 TABLET DAILY Patient not taking: Reported on 09/24/2017 08/09/17   Arfeen, Phillips GroutSyed T, MD  liraglutide (VICTOZA) 18 MG/3ML SOPN Inject 1.8 mg into the skin daily.    [provider]  lisinopril (PRINIVIL,ZESTRIL) 40 MG tablet TAKE 1 TABLET(40 MG) BY MOUTH DAILY Patient taking differently: Take 40 mg by mouth daily.  07/28/17   Hilty, Lisette AbuKenneth C, MD  omeprazole (PRILOSEC) 20 MG capsule Take 20 mg by mouth daily.    [provider]  pantoprazole (PROTONIX) 40 MG tablet Take 1 tablet (40 mg total) by mouth daily. 01/10/18   Dione BoozeGlick, David, MD  pregabalin (LYRICA) 75 MG capsule Take 75 mg by mouth 2 (two) times daily.    [provider]  PROVENTIL HFA 108 (90 Base)  MCG/ACT inhaler INHALE 2 PUFFS INTO THE LUNGS EVERY 6 HOURS AS NEEDED FOR WHEEZING OR SHORTNESS OF BREATH Patient taking differently: Inhale 2 puffs into the lungs every 6 (six) hours as needed for wheezing or shortness of breath.  03/17/17   Bevelyn NgoGroce, Sarah F, NP  valsartan (DIOVAN) 160 MG tablet TAKE 1 TABLET(160 MG) BY MOUTH DAILY Patient taking differently: Take 160 mg by mouth daily.  07/06/17   Chrystie NoseHilty, Kenneth C, MD    Physical Exam: Vitals:   02/13/18 1530 02/13/18 1540 02/13/18 1600 02/13/18 1610  BP:   136/83 136/83  Pulse: (!) 42 (!) 45  (!) 43  Resp: 10 16  18   Temp:      TempSrc:      SpO2: 98% 100%    Weight:      Height:        Constitutional:  . Appears calm and comfortable.  Patient is obese. Eyes:  Marland Kitchen. Mild pallor.  No jaundice.  ENMT:  .  Dry buccal mucosa.   Neck:  . Neck is supple. No JVD Respiratory:  . CTA bilaterally, no w/r/r.  . Respiratory effort normal. No retractions or accessory muscle use Cardiovascular:  . S1S2 . Fullness of the ankle bilaterally.     Abdomen:  . Abdomen is morbidly obese, soft and non tender. Organs are difficult to assess. Neurologic:  . Awake and alert. . Moves all limbs.  Wt Readings from Last 3 Encounters:  02/13/18 130.2 kg  01/09/18 131.5 kg  09/24/17 (!) 147.8 kg    I have personally reviewed following labs and imaging studies  Labs on Admission:  CBC: Recent Labs  Lab 02/13/18 1210  WBC 3.6*  NEUTROABS 1.6*  HGB 11.9*  HCT 37.4  MCV 87.8  PLT 194   Basic Metabolic Panel: Recent Labs  Lab 02/13/18 1210  NA 139  K 3.6  CL 106  CO2 22  GLUCOSE 83  BUN 10  CREATININE 1.13*  CALCIUM 8.9   Liver Function Tests: Recent Labs  Lab 02/13/18 1210  AST 15  ALT 14  ALKPHOS 57  BILITOT 0.6  PROT 6.6  ALBUMIN 3.6   Recent Labs  Lab 02/13/18 1210  LIPASE 29   No results for input(s): AMMONIA in the last 168 hours. Coagulation Profile: No results for input(s): INR, PROTIME in the last 168  hours. Cardiac Enzymes: No results for input(s): CKTOTAL, CKMB, CKMBINDEX, TROPONINI in the last 168 hours. BNP (last 3 results) No results for input(s): PROBNP in the last 8760 hours. HbA1C: No results for input(s): HGBA1C in the last 72 hours. CBG: Recent Labs  Lab 02/13/18 1146  GLUCAP 77   Lipid Profile: No results for input(s): CHOL, HDL, LDLCALC, TRIG, CHOLHDL, LDLDIRECT in the last 72 hours. Thyroid Function Tests: No results for input(s): TSH, T4TOTAL, FREET4, T3FREE, THYROIDAB in the last 72 hours. Anemia Panel: No results for input(s): VITAMINB12, FOLATE, FERRITIN, TIBC, IRON, RETICCTPCT in the last 72 hours. Urine analysis:    Component Value Date/Time   COLORURINE YELLOW 01/22/2016 1058   APPEARANCEUR CLEAR 01/22/2016 1058   LABSPEC 1.019 01/22/2016 1058   PHURINE 5.0 01/22/2016 1058   GLUCOSEU NEGATIVE 01/22/2016 1058   HGBUR NEGATIVE 01/22/2016 1058   BILIRUBINUR NEGATIVE 01/22/2016 1058   KETONESUR NEGATIVE 01/22/2016 1058   PROTEINUR NEGATIVE 01/22/2016 1058   UROBILINOGEN 1.0 07/14/2009 1644   NITRITE NEGATIVE 01/22/2016 1058   LEUKOCYTESUR NEGATIVE 01/22/2016 1058   Sepsis Labs: @LABRCNTIP (procalcitonin:4,lacticidven:4) )No results found for this or any previous visit (from the past 240 hour(s)).    Radiological Exams on Admission: Dg Chest 2 View  Result Date: 02/13/2018 CLINICAL DATA:  Syncope. EXAM: CHEST - 2 VIEW COMPARISON:  Chest x-ray dated 01/09/2018 FINDINGS: The heart size and mediastinal contours are within normal limits. Both lungs are clear. The visualized skeletal structures are unremarkable. IMPRESSION: Normal exam. Electronically Signed   By: Francene Boyers M.D.   On: 02/13/2018 12:33    EKG: Independently reviewed.   Active Problems:   Syncope   Assessment/Plan Syncope, etiology is unclear: -Possible vasovagal. -Cannot rule out rule of possible orthostatic drop in blood pressure. -Sinus bradycardia is noted, but heart rate is  in the 40s (this should not normally cause significant symptoms at this age) -Possible rule out volume depletion. -Cannot rule out arrhythmias -Admit patient -Cycle cardiac enzymes -Hydrate patient -Orthostasis -Telemetry monitoring -Echocardiogram -Check TSH. -Low threshold to consult cardiology team -Possible: Holter monitoring on discharge  Diabetes mellitus: Continue to optimize.  Volume  depletion: IV fluids. Monitor closely.  Morbid obesity: Patient is status post sleeve surgery Stable.  Further management will depend on hospital course.  DVT prophylaxis: Subcu Lovenox Code Status: Full Family Communication:  Disposition Plan: Home eventually Consults called: None Admission status: Observation  Time spent: 65 minutes.   Berton Mount, MD  [facility] Pager #: [phone] 7PM-7AM contact night coverage as above  02/13/2018, 4:32 PM

## 2018-02-13 NOTE — ED Notes (Signed)
Pt was able to stand to take vitals at 0 min. Pt stated she was find while waiting on 3 min. Orthostatic but didn't look like she was able to continue standing so I, Alroy Dust NA II help the pt back onto the bed.

## 2018-02-13 NOTE — ED Notes (Signed)
Pt drinking orang juice.

## 2018-02-13 NOTE — ED Triage Notes (Addendum)
Pt arrives EMS from work where she had 2 separate episodes of syncope the first happening while standing with dizziness the second on rising from first. N/V last night with epigastric pain . Hx of gastric sleeve. Bradycardia noted by ems. C/o nausea denies pain but c/o epigastric pain with palpation, "where my hernia is"

## 2018-02-13 NOTE — ED Provider Notes (Signed)
[facility] EMERGENCY DEPARTMENT Provider Note   CSN: [id number] Arrival date & time: 02/13/18  1137     History   Chief Complaint Chief Complaint  Patient presents with  . Near Syncope  . Loss of Consciousness    HPI Melanie Hess is a 48 y.o. female.  The history is provided by the patient and medical records. No language interpreter was used.  Near Syncope   Loss of Consciousness   Melanie Hess is a 48 y.o. female who presents to the Emergency Department complaining of syncope. Presents to the emergency department by EMS for evaluation of syncopal event times two. 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 She then syncopal eyes and fell to the floor. She did not hit her head. She only had very brief loss of consciousness. She then had a second very brief syncopal event. She reports having epigastric pain for the last few days, one episode of emesis yesterday. She did have a gastric sleeve surgery in July of last year. She denies any fevers, chest pain, shortness of breath, leg swelling or pain. She has a history of syncope in the past, last episode was several months ago. She has not eaten today. Past Medical History:  Diagnosis Date  . Ankle fracture, right 2001  . Anxiety   . Arthritis   . Asthma   . Carpal tunnel syndrome    Bilateral  . Diabetes mellitus without complication (HCC)    Type II  . GERD (gastroesophageal reflux disease)   . Hypertension   . Migraine   . OSA (obstructive sleep apnea)   . Plantar fasciitis of right foot   . Pneumonia    hx  . Shingles 11/2015  . Shortness of breath dyspnea    with walking short distances    Patient Active Problem List   Diagnosis Date Noted  . Esophageal reflux 03/10/2017  . Preoperative clearance 08/12/2016  . Chest pain 04/07/2016  . LVH (left ventricular hypertrophy) 04/07/2016  . Hepatic steatosis 04/07/2016  . Diabetes  mellitus with complication (HCC)   . Moderate persistent asthma 12/09/2015  . OSA on CPAP 10/23/2015  . Pain of right heel 08/01/2015  . Chronic pain   . Anemia 03/24/2014  . Diabetes mellitus type 2, controlled, without complications (HCC) 03/24/2014  . Asthma exacerbation 03/24/2014  . Hypertension 02/28/2014  . Morbid obesity (HCC) 02/28/2014  . DOE (dyspnea on exertion) 02/28/2014  . Dyspnea and respiratory abnormality 02/14/2014    Past Surgical History:  Procedure Laterality Date  . CARPAL TUNNEL RELEASE Left   . GASTRIC BYPASS    . HEEL SPUR RESECTION Right 08/01/2015   Procedure: HEEL SPUR EXCISIONS;  Surgeon: Marcene Corning, MD;  Location: [facility] OR;  Service: Orthopedics;  Laterality: Right;  . HEEL SPUR RESECTION Left 01/27/2016   Procedure: LEFT HEEL SPUR EXCISION AND TENDON ACHILLIES REPAIR;  Surgeon: Marcene Corning, MD;  Location: [facility] OR;  Service: Orthopedics;  Laterality: Left;  PRONE POSITION  . TUBAL LIGATION       OB History   No obstetric history on file.      Home Medications    Prior to Admission medications   Medication Sig Start Date End Date Taking? Authorizing Provider  albuterol (PROVENTIL) (2.5 MG/3ML) 0.083% nebulizer solution Take 2.5 mg by nebulization every 6 (six) hours as needed for wheezing or shortness of breath.     [provider]  amLODipine (NORVASC) 5 MG tablet TAKE 1 TABLET BY MOUTH DAILY Patient not taking: Reported on 08/29/2017 07/06/17   Azalee CourseMeng, Hao, PA  BREO ELLIPTA 200-25 MCG/INH AEPB INHALE 1 PUFF INTO LUNGS DAILY Patient taking differently: Inhale 1 puff into the lungs daily.  03/30/16   Kalman Shanamaswamy, Murali, MD  cetirizine (ZYRTEC) 10 MG tablet Take 10 mg by mouth as needed for allergies.    [provider]  chlorthalidone (HYGROTON) 25 MG tablet Take 25 mg by mouth every morning.    [provider]  diphenhydrAMINE (BENADRYL) 25 MG tablet Take 1 tablet (25 mg total) by mouth every 6 (six) hours. Patient taking  differently: Take 25 mg by mouth 2 (two) times daily as needed for allergies.  09/24/17   Sabas SousBero, Michael M, MD  escitalopram (LEXAPRO) 10 MG tablet TAKE 1 TABLET DAILY Patient not taking: Reported on 09/24/2017 08/09/17   Arfeen, Phillips GroutSyed T, MD  liraglutide (VICTOZA) 18 MG/3ML SOPN Inject 1.8 mg into the skin daily.    [provider]  lisinopril (PRINIVIL,ZESTRIL) 40 MG tablet TAKE 1 TABLET(40 MG) BY MOUTH DAILY Patient taking differently: Take 40 mg by mouth daily.  07/28/17   Hilty, Lisette AbuKenneth C, MD  omeprazole (PRILOSEC) 20 MG capsule Take 20 mg by mouth daily.    [provider]  pantoprazole (PROTONIX) 40 MG tablet Take 1 tablet (40 mg total) by mouth daily. 01/10/18   Dione BoozeGlick, David, MD  pregabalin (LYRICA) 75 MG capsule Take 75 mg by mouth 2 (two) times daily.    [provider]  PROVENTIL HFA 108 (90 Base) MCG/ACT inhaler INHALE 2 PUFFS INTO THE LUNGS EVERY 6 HOURS AS NEEDED FOR WHEEZING OR SHORTNESS OF BREATH Patient taking differently: Inhale 2 puffs into the lungs every 6 (six) hours as needed for wheezing or shortness of breath.  03/17/17   Bevelyn NgoGroce, Sarah F, NP  valsartan (DIOVAN) 160 MG tablet TAKE 1 TABLET(160 MG) BY MOUTH DAILY Patient taking differently: Take 160 mg by mouth daily.  07/06/17   Hilty, Lisette AbuKenneth C, MD    Family History Family History  Problem Relation Age of Onset  . Hypertension Mother   . Pulmonary embolism Mother   . Depression Mother   . Breast cancer Maternal Aunt   . Brain cancer Cousin   . CAD Maternal Grandmother   . Microcephaly Maternal Grandmother   . Heart attack Maternal Grandmother     Social History Social History   Tobacco Use  . Smoking status: Never Smoker  . Smokeless tobacco: Never Used  . Tobacco comment: years ago may have smoked 1 cig or less a week  Substance Use Topics  . Alcohol use: No    Alcohol/week: 0.0 standard drinks  . Drug use: No     Allergies   Aspirin; Nsaids; Penicillins; Tolmetin;  Oxycodone-acetaminophen; Percocet [oxycodone-acetaminophen]; Vicodin [hydrocodone-acetaminophen]; Adhesive [tape]; and Tramadol   Review of Systems Review of Systems  Cardiovascular: Positive for syncope and near-syncope.  All other systems reviewed and are negative.    Physical Exam Updated Vital Signs BP 136/83   Pulse (!) 45   Temp 97.7 F (36.5 C) (Oral)   Resp 16   Ht 5\' 5"  (1.651 m)   Wt 130.2 kg   LMP 02/13/2017   SpO2 100%   BMI 47.76 kg/m   Physical Exam Vitals signs and nursing note reviewed.  Constitutional:      Appearance: She is well-developed.  HENT:     Head: Normocephalic and atraumatic.  Cardiovascular:  Rate and Rhythm: Regular rhythm.     Heart sounds: No murmur.     Comments: bradycardic Pulmonary:     Effort: Pulmonary effort is normal. No respiratory distress.     Breath sounds: Normal breath sounds.  Abdominal:     Tenderness: There is no guarding or rebound.     Comments: Mild epigastric tenderness  Musculoskeletal:        General: No tenderness.  Skin:    General: Skin is warm and dry.  Neurological:     Mental Status: She is alert and oriented to person, place, and time.  Psychiatric:        Behavior: Behavior normal.      ED Treatments / Results  Labs (all labs ordered are listed, but only abnormal results are displayed) Labs Reviewed  COMPREHENSIVE METABOLIC PANEL - Abnormal; Notable for the following components:      Result Value   Creatinine, Ser 1.13 (*)    GFR calc non Af Amer 57 (*)    All other components within normal limits  CBC WITH DIFFERENTIAL/PLATELET - Abnormal; Notable for the following components:   WBC 3.6 (*)    Hemoglobin 11.9 (*)    Neutro Abs 1.6 (*)    All other components within normal limits  LIPASE, BLOOD  URINALYSIS, ROUTINE W REFLEX MICROSCOPIC  I-STAT TROPONIN, ED  CBG MONITORING, ED    EKG EKG Interpretation  Date/Time:  Monday February 13 2018 11:43:33 EST Ventricular Rate:  45 PR  Interval:    QRS Duration: 104 QT Interval:  517 QTC Calculation: 448 R Axis:   74 Text Interpretation:  Sinus bradycardia Confirmed by Tilden Fossaees, Juda Lajeunesse [phone]([id number]) on 02/13/2018 11:46:42 AM Also confirmed by Tilden Fossaees, Roland Prine [phone]([id number]), editor Barbette Hairassel, Kerry [phone]([id number])  on 02/13/2018 1:32:33 PM   Radiology Dg Chest 2 View  Result Date: 02/13/2018 CLINICAL DATA:  Syncope. EXAM: CHEST - 2 VIEW COMPARISON:  Chest x-ray dated 01/09/2018 FINDINGS: The heart size and mediastinal contours are within normal limits. Both lungs are clear. The visualized skeletal structures are unremarkable. IMPRESSION: Normal exam. Electronically Signed   By: Francene BoyersJames  Maxwell M.D.   On: 02/13/2018 12:33    Procedures Procedures (including critical care time)  Medications Ordered in ED Medications  sodium chloride 0.9 % bolus 500 mL (0 mLs Intravenous Stopped 02/13/18 1450)  ondansetron (ZOFRAN) injection 4 mg (4 mg Intravenous Given 02/13/18 1507)     Initial Impression / Assessment and Plan / ED Course  I have reviewed the triage vital signs and the nursing notes.  Pertinent labs & imaging results that were available during my care of the patient were reviewed by me and considered in my medical decision making (see chart for details).     With history of diabetes, gastric bypass last summer here for evaluation following syncopal event times two. She is bradycardic on evaluation but in no acute distress. She does feel nauseous and un well in general. Labs with mild elevation in her creatinine. She was treated with IVF in ED, antiemetic.   Plan to admit for observation due to syncope. Medicine consulted for admission.  Patient updated of findings of studies and recommendation for admission and she is in agreement with treatment plan.  Final Clinical Impressions(s) / ED Diagnoses   Final diagnoses:  None    ED Discharge Orders    None       Tilden Fossaees, Laddie Math, MD 02/13/18 [phone]

## 2018-02-14 ENCOUNTER — Other Ambulatory Visit: Payer: Self-pay | Admitting: Physician Assistant

## 2018-02-14 ENCOUNTER — Observation Stay (HOSPITAL_BASED_OUTPATIENT_CLINIC_OR_DEPARTMENT_OTHER): Payer: Medicaid Other

## 2018-02-14 DIAGNOSIS — D649 Anemia, unspecified: Secondary | ICD-10-CM

## 2018-02-14 DIAGNOSIS — R55 Syncope and collapse: Secondary | ICD-10-CM

## 2018-02-14 DIAGNOSIS — E119 Type 2 diabetes mellitus without complications: Secondary | ICD-10-CM

## 2018-02-14 DIAGNOSIS — R001 Bradycardia, unspecified: Secondary | ICD-10-CM | POA: Diagnosis not present

## 2018-02-14 DIAGNOSIS — E876 Hypokalemia: Secondary | ICD-10-CM | POA: Diagnosis not present

## 2018-02-14 DIAGNOSIS — J4540 Moderate persistent asthma, uncomplicated: Secondary | ICD-10-CM

## 2018-02-14 DIAGNOSIS — I379 Nonrheumatic pulmonary valve disorder, unspecified: Secondary | ICD-10-CM

## 2018-02-14 LAB — CBC
HCT: 34.1 % — ABNORMAL LOW (ref 36.0–46.0)
Hemoglobin: 11.3 g/dL — ABNORMAL LOW (ref 12.0–15.0)
MCH: 28.5 pg (ref 26.0–34.0)
MCHC: 33.1 g/dL (ref 30.0–36.0)
MCV: 86.1 fL (ref 80.0–100.0)
Platelets: 192 10*3/uL (ref 150–400)
RBC: 3.96 MIL/uL (ref 3.87–5.11)
RDW: 15.1 % (ref 11.5–15.5)
WBC: 4.4 10*3/uL (ref 4.0–10.5)
nRBC: 0 % (ref 0.0–0.2)

## 2018-02-14 LAB — BASIC METABOLIC PANEL
Anion gap: 7 (ref 5–15)
BUN: 10 mg/dL (ref 6–20)
CO2: 24 mmol/L (ref 22–32)
Calcium: 8.5 mg/dL — ABNORMAL LOW (ref 8.9–10.3)
Chloride: 109 mmol/L (ref 98–111)
Creatinine, Ser: 1.08 mg/dL — ABNORMAL HIGH (ref 0.44–1.00)
GFR calc Af Amer: >60 mL/min (ref >60)
GFR calc non Af Amer: >60 mL/min (ref >60)
Glucose, Bld: 129 mg/dL — ABNORMAL HIGH (ref 70–99)
Potassium: 3.1 mmol/L — ABNORMAL LOW (ref 3.5–5.1)
Sodium: 140 mmol/L (ref 135–145)

## 2018-02-14 LAB — GLUCOSE, CAPILLARY
Glucose-Capillary: 75 mg/dL (ref 70–99)
Glucose-Capillary: 97 mg/dL (ref 70–99)

## 2018-02-14 LAB — ECHOCARDIOGRAM COMPLETE
Height: 65 in
Weight: 4592 oz

## 2018-02-14 LAB — TROPONIN I: Troponin I: <0.03 ng/mL (ref <0.03)

## 2018-02-14 LAB — HIV ANTIBODY (ROUTINE TESTING W REFLEX): HIV Screen 4th Generation wRfx: NONREACTIVE

## 2018-02-14 MED ORDER — GLUCERNA SHAKE PO LIQD
237.0000 mL | Freq: Three times a day (TID) | ORAL | Status: DC
  Filled 2018-02-14 (×3): qty 237

## 2018-02-14 MED ORDER — LIDOCAINE VISCOUS HCL 2 % MT SOLN
15.0000 mL | Freq: Once | OROMUCOSAL | Status: AC
  Administered 2018-02-14: 15 mL via ORAL
  Filled 2018-02-14: qty 15

## 2018-02-14 MED ORDER — POTASSIUM CHLORIDE CRYS ER 20 MEQ PO TBCR
20.0000 meq | EXTENDED_RELEASE_TABLET | Freq: Once | ORAL | Status: AC
  Administered 2018-02-14: 20 meq via ORAL
  Filled 2018-02-14 (×2): qty 1

## 2018-02-14 MED ORDER — SUCRALFATE 1 GM/10ML PO SUSP: 1.0000 g | Freq: Three times a day (TID) | ORAL | Status: DC

## 2018-02-14 MED ORDER — ONDANSETRON HCL 4 MG/2ML IJ SOLN
4.0000 mg | Freq: Four times a day (QID) | INTRAMUSCULAR | Status: DC | PRN
  Administered 2018-02-14: 4 mg via INTRAVENOUS
  Filled 2018-02-14: qty 2

## 2018-02-14 MED ORDER — ALUM & MAG HYDROXIDE-SIMETH 200-200-20 MG/5ML PO SUSP
30.0000 mL | Freq: Once | ORAL | Status: AC
  Administered 2018-02-14: 30 mL via ORAL
  Filled 2018-02-14: qty 30

## 2018-02-14 MED ORDER — OMEPRAZOLE 20 MG PO CPDR: 20.0000 mg | DELAYED_RELEASE_CAPSULE | Freq: Two times a day (BID) | ORAL | 0 refills | Status: AC

## 2018-02-14 NOTE — Progress Notes (Signed)
Pt has increased epigastric pain when eating or drinking, provider paged for nausea medicine.

## 2018-02-14 NOTE — Progress Notes (Deleted)
Melanie Hess, is a 48 y.o. female  DOB 10-18-1970  MRN [id number].  Admission date:  02/13/2018  Admitting Physician  Barnetta Chapel, MD  Discharge Date:  02/14/2018   Primary MD  [name], MD  Recommendations for primary care physician for things to follow:   - Subclinical hypothyroidism found will need follow-up thyroid studies in 6 to 8 weeks - Recheck a BMP  Discharge Diagnosis    Active Problems:   Morbid obesity (HCC)   Anemia   Diabetes mellitus type 2, controlled, without complications (HCC)   Moderate persistent asthma   Syncope   Hypokalemia       Past Medical History:  Diagnosis Date  . Ankle fracture, right 2001  . Anxiety   . Arthritis   . Asthma   . Carpal tunnel syndrome    Bilateral  . Diabetes mellitus without complication (HCC)    Type II  . GERD (gastroesophageal reflux disease)   . Hernia, abdominal   . Hypertension   . Migraine   . OSA (obstructive sleep apnea)   . Plantar fasciitis of right foot   . Pneumonia    hx  . Shingles 11/2015  . Shortness of breath dyspnea    with walking short distances    Past Surgical History:  Procedure Laterality Date  . CARPAL TUNNEL RELEASE Left   . GASTRIC BYPASS    . HEEL SPUR RESECTION Right 08/01/2015   Procedure: HEEL SPUR EXCISIONS;  Surgeon: Marcene Corning, MD;  [facility];  Service: Orthopedics;  Laterality: Right;  . HEEL SPUR RESECTION Left 01/27/2016   Procedure: LEFT HEEL SPUR EXCISION AND TENDON ACHILLIES REPAIR;  Surgeon: Marcene Corning, MD;  Location: [facility] OR;  Service: Orthopedics;  Laterality: Left;  PRONE POSITION  . LAPAROSCOPIC GASTRIC SLEEVE RESECTION    . TUBAL LIGATION         HPI  from the history and physical done on the day of admission:  Patient is a 48 year old female with past medical history  significant for diabetes mellitus, hypertension, morbid obesity for which patient has undergone sleeve surgery amongst other past medical history.  According to the patient, she underwent the cyst surgery around June 2019 and has lost more than 100 pounds.  Patient weighed 406 pounds prior to the surgery, and now weighs in the 200s.  Patient reported having 2 episodes of syncope while at work.  Patient described was feeling "lightheaded", spinning of the head, feeling very hot inside and eventually passing out.  Following the surgery, patient has had  poor p.o. intake (both liquid and solid).  Patient seems volume depleted.  No recent history of URI symptoms or ear problems.  Patient has had syncopal episodes in the past, but no further details.  No headache, no neck pain, no fever or chills, no chest pain, no shortness of breath, no GI symptoms and no urinary symptoms.  Work-up done revealed significant bradycardia (45 bpm).  BUN is 10, with serum creatinine of 1.13.  Point-of-care troponin is 0.00.  Hemoglobin is 11.9 with hematocrit of 37.4.  Reveals specific gravity of 1.019.  Chest x-ray has not revealed any acute findings.  Patient will be admitted for further assessment and management.  ED Course: Patient was volume resuscitated.  Above work-up was done by the ER provider.  TSH has been added to patient's work-up.    Hospital Course:   1.  Syncope: Patient reports having 2 episodes of syncope while at work.  She reported poor p.o. intake.  Orthostatic vital signs were seen to be positive with diastolic blood pressure drops from lying to sitting.  However, did not not appear consistent when standing.  Patient also seen to be bradycardic.  Echocardiogram revealed EF of 60- 65% with normal systolic function and indeterminate diastolic filling pressures.  Dr. Jens Som cardiology consulted and recommended Holter monitor to evaluate for the possibility of arrhythmia and set patient up for exercise stress  test. Encouraged patient to keep hydrated.  Advised regarding the restriction of from driving for at least 6 months.  2.  Sinus bradycardia: Acute.  Heart rates in the upper 30s - 40s during much of the day.  Echocardiogram has been ordered.  Holter monitor to be set up in outpatient setting.   3.  Epigastric discomfort/GERD: Patient with previous studies including upper GI series showing mild esophageal dysmotility with spontaneous gastroesophageal reflux in 02/2016 and mild tertiary contractions in the mid distal esophagus noted on barium esophagogram from 05/2017.  Patient followed in outpatient setting by Dr. Lily Peer [facility]. Will need to reschedule he outpatient follow-up with GI.  Patient was given information on foods to decrease acid reflux.  4.  Essential hypertension: Patient reports not taking any of her blood pressure medications including Diovan, lisinopril, or amlodipine.  Blood pressures noted to range from 108/61 -169/92.  Recommending her to follow-up with her primary care provider to determine if need of restarting these medications.  5.  Subclinical hypothyroidism: On admission TSH 1.665 and free T4 0.79.  Will need to repeat thyroid testing in 6 to 8 weeks.  6.  Diabetes mellitus type 2: Last hemoglobin A1c noted to be 5.5 on 02/13/2018.  Patient not on any oral hypoglycemic agents.  Recommend carb modified diet.  7.  Morbid obesity: Patient status post gastric sleeve procedure in June 2019.  Patient reports being down approximately 100 pounds since the procedure.  8.  Normocytic anemia: Stable.  Hemoglobin 11.9 on admission.  No reports of bleeding noted.  9.  Hypokalemia: Replaced.  Potassium noted to be around 3.1 patient was given 20 mEq of potassium chloride p.o. and had been on normal saline IV with 20 mEq of potassium chloride fluids.  Patient can follow-up as an outpatient for repeat blood work.  10.  Asthma mild intermittent: Patient without significant signs  of wheezing.  Continue on home inhalers    Follow UP  Follow-up Information    [name], MD. Schedule an appointment as soon as possible for a visit in 1 week(s).   Specialty:  Family Medicine Why:  Call and make an appointment for 1 week Contact information: [address] [place] [address] [phone]        Chrystie NoseHilty, Kenneth C, MD .   Specialty:  Cardiology Contact information: [address] [place] [address] [phone] March 23           Consults obtained: Cardiology, Olga MillersBrian Crenshaw  Discharge Condition: stable  Diet and Activity recommendation: See Discharge Instructions below   Discharge Instructions    Ambulatory referral to Cardiology   Complete by:  As directed    Of note Crenshaw saw inpatient and arranging for event monitor as well.   Diet - low sodium heart healthy   Complete by:  As directed    Discharge instructions   Complete by:  As directed    You were evaluated during this hospitalization for syncope.  It is thought that the symptoms may have likely been due to you not taking in adequate fluids.  You will be set up with a Holter monitor to evaluate possibility of a irregular heart rhythm as the cause of your syncopal episode.  Due to the fact that you had a syncopal episode it is advised that you do not drive for 6 months until evaluated and cleared by your doctor.  I recommended that you increase Prilosec to twice daily.  Please set up appointment with your primary care provider within 1 week, cardiology Dr. Rennis GoldenHilty as previously advised, and [facility]enterologist [facility].   Increase activity slowly   Complete by:  As directed         Discharge Medications     Allergies as of 02/14/2018      Reactions   Aspirin Shortness Of Breath, Swelling   Nsaids Shortness Of Breath, Swelling   Penicillins Anaphylaxis, Swelling, Rash, Other (See Comments)   PCN reaction  causing immediate rash, facial/tongue/throat swelling, SOB or lightheadedness with hypotension: YES PCN reaction causing severe rash involving mucus membranes or skin necrosis: YES PCN reaction that required hospitalization YES PCN reaction occurring within the last 10 years: NO   Tolmetin Shortness Of Breath, Swelling   Oxycodone-acetaminophen Hives, Itching   Percocet [oxycodone-acetaminophen] Hives, Itching   Vicodin [hydrocodone-acetaminophen] Hives, Itching   Adhesive [tape] Itching, Rash   Tramadol Nausea Only      Medication List    STOP taking these medications   amLODipine 5 MG tablet Commonly known as:  NORVASC   diphenhydrAMINE 25 MG tablet Commonly known as:  BENADRYL   escitalopram 10 MG tablet Commonly known as:  LEXAPRO   lisinopril 40 MG tablet Commonly known as:  PRINIVIL,ZESTRIL   pantoprazole 40 MG tablet Commonly known as:  PROTONIX   valsartan 160 MG tablet Commonly known as:  DIOVAN     TAKE these medications   albuterol (2.5 MG/3ML) 0.083% nebulizer solution Commonly known as:  PROVENTIL Take 2.5 mg by nebulization every 6 (six) hours as needed for wheezing or shortness of breath. What changed:  Another medication with the same name was changed. Make sure you understand how and when to take each.   PROVENTIL HFA 108 (90 Base) MCG/ACT inhaler Generic drug:  albuterol INHALE 2 PUFFS INTO THE LUNGS EVERY 6 HOURS AS NEEDED FOR WHEEZING OR SHORTNESS OF BREATH What changed:  See the new instructions.   BREO ELLIPTA 200-25 MCG/INH Aepb Generic drug:  fluticasone furoate-vilanterol INHALE 1 PUFF INTO LUNGS DAILY What changed:  See the  new instructions.   cetirizine 10 MG tablet Commonly known as:  ZYRTEC Take 10 mg by mouth daily as needed for allergies.   dicyclomine 10 MG capsule Commonly known as:  BENTYL Take 10 mg by mouth at bedtime.   EMGALITY 120 MG/ML Soaj Generic drug:  Galcanezumab-gnlm Inject 120 mLs into the skin every 30  (thirty) days. For migraine prevention   IRON PO Take 1 tablet by mouth at bedtime.   omeprazole 20 MG capsule Commonly known as:  PRILOSEC Take 1 capsule (20 mg total) by mouth 2 (two) times daily before a meal. What changed:  when to take this   pregabalin 75 MG capsule Commonly known as:  LYRICA Take 75 mg by mouth daily as needed (nerve pain).   ursodiol 250 MG tablet Commonly known as:  ACTIGALL Take 250 mg by mouth at bedtime.   VITAMIN B-12 PO Take 1 tablet by mouth at bedtime.   VITAMIN C PO Take 1 tablet by mouth at bedtime.       Major procedures and Radiology Reports - PLEASE review detailed and final reports for all details, in brief -   Echocardiogram 02/14/2018: Impression revealed EF of 60 to 65% with indeterminate pattern of diastolic filling.  There was mild pulmonic valve regurgitation and the empiric vena cava was dilated in size with >50% respiratory variability.   Dg Chest 2 View  Result Date: 02/13/2018 CLINICAL DATA:  Syncope. EXAM: CHEST - 2 VIEW COMPARISON:  Chest x-ray dated 01/09/2018 FINDINGS: The heart size and mediastinal contours are within normal limits. Both lungs are clear. The visualized skeletal structures are unremarkable. IMPRESSION: Normal exam. Electronically Signed   By: Francene BoyersJames  Maxwell M.D.   On: 02/13/2018 12:33    Micro Results     No results found for this or any previous visit (from the past 240 hour(s)).     Today   Subjective    Melanie Hess reports that she is still having epigastric pain and discomfort with eating or drinking.  She had an appointment with [facility]ernandez [facility] yesterday that she will need to reschedule.  Denies having any significant chest pain or palpitations.  She reports that she has not been taking any of her blood pressure medications since home her gastric sleeve procedure in 2019.    Objective   Blood pressure 104/78, pulse (!) 42,  temperature 98 F (36.7 C), temperature source Oral, resp. rate 18, height 5\' 5"  (1.651 m), weight 130.2 kg, last menstrual period 02/13/2017, SpO2 96 %.   Intake/Output Summary (Last 24 hours) at 02/14/2018 1500 Last data filed at 02/14/2018 [id number] Gross per 24 hour  Intake 366.67 ml  Output -  Net 366.67 ml    Exam  Constitutional: Morbidly obese female in NAD, calm, comfortable Eyes: PERRL, lids and conjunctivae normal ENMT: Mucous membranes are dry. Posterior pharynx clear of any exudate or lesions.  Neck: normal, supple, no masses, no thyromegaly Respiratory: Decreased aeration, clear to auscultation bilaterally, no wheezing, no crackles. Normal respiratory effort. No accessory muscle use.  Cardiovascular: Regular rate and rhythm, no murmurs / rubs / gallops. No extremity edema. 2+ pedal pulses. No carotid bruits.  Abdomen: Protuberant abdomen, no tenderness, no masses palpated. No hepatosplenomegaly. Bowel sounds positive.  Musculoskeletal: no clubbing / cyanosis. No joint deformity upper and lower extremities. Good ROM, no contractures. Normal muscle tone.  Skin: no rashes, lesions, ulcers. No induration Neurologic: CN 2-12 grossly intact. Sensation intact, DTR normal.  Strength 5/5 in all 4.  Psychiatric: Normal judgment and insight. Alert and oriented x 3. Normal mood.    Data Review   CBC w Diff:  Lab Results  Component Value Date   WBC 4.4 02/13/2018   HGB 11.3 (L) 02/13/2018   HCT 34.1 (L) 02/13/2018   PLT 192 02/13/2018   LYMPHOPCT 42 02/13/2018   MONOPCT 10 02/13/2018   EOSPCT 3 02/13/2018   BASOPCT 0 02/13/2018    CMP:  Lab Results  Component Value Date   NA 140 02/13/2018   K 3.1 (L) 02/13/2018   CL 109 02/13/2018   CO2 24 02/13/2018   BUN 10 02/13/2018   CREATININE 1.08 (H) 02/13/2018   PROT 6.6 02/13/2018   ALBUMIN 3.6 02/13/2018   BILITOT 0.6 02/13/2018   ALKPHOS 57 02/13/2018   AST 15 02/13/2018   ALT 14 02/13/2018  .   Total Time in preparing  paper work, data evaluation and todays exam - 35 minutes  Clydie Braun M.D on 02/14/2018 at 3:00 PM  [facility]   Office  [phone]

## 2018-02-14 NOTE — Progress Notes (Signed)
  Echocardiogram 2D Echocardiogram has been performed.  Delcie Roch 02/14/2018, 10:36 AM

## 2018-02-14 NOTE — Consult Note (Signed)
Cardiology Consultation:   Patient ID: Melanie Hess MRN: [id number]; DOB: [birth year]  Admit date: 02/13/2018 Date of Consult: 02/14/2018  Primary Care Provider: [name], MD Primary Cardiologist: Chrystie Nose, MD  Primary Electrophysiologist:  None    Patient Profile:   Melanie Hess is a 48 y.o. female with a hx of HTN, DM2, OSA on CPAP, asthma, and morbid obesity s/p gastric sleeve who is being seen today for the evaluation of syncope and bradycardia at the request of Dr. Arn Medal.  History of Present Illness:   Melanie Hess was last seen by our clinic in consult 04/08/16 for chest pain. She had negative enzymes, but did have upcoming gastric bypass surgery. Therefore, she underwent coronary CTA (for cardiac clearance) which found no evidence of CAD and coronary calcium score of zero. She has not been seen by cardiology since, but has had significant weight loss since sleeve surgery (July 2019).  She presented to [facility] after two syncopal episodes. She reports lightheadedness and head spinning and felt very hot before passing out yesterday. She has had poor PO intake since sleeve surgery secondary to epigastric pain with eating and drinking. In ER, she was bradycardic in the 40s. It was felt she was hypovolemic and was given IVF.   On my interview, she first had a syncopal event right after her sleeve surgery last summer. Yesterday, she was working at [employer] on her feet (started working this job 2 months ago) when she began to feel lightheaded and hot. She asked to eat some crackers, but apparently passed out with LOC prior to her eating. The event was witnessed by coworkers, but bystander reports of the event are unknown, time down unknown, possible injuries (head injury) unknown. She reports no pain and no headache. She is diabetic on insulin; however, she has not been taking her insulin as her BG has been normal. She reports that she has very poor PO intake,  sometimes only eating one french fry and one half nugget for a meal. She has intense pain with eating and drinking. She follows with GI at [facility] and is due to see them in follow up (possible discussion of surgical revision of her sleeve given her ongoing complications). GI has advised to return to a liquid diet for now.   She states she is not normally bradycardic in the 40s, but is unsure of her normal resting HR. Previous EKGs with heart rate of 58-59.    Past Medical History:  Diagnosis Date  . Ankle fracture, right 2001  . Anxiety   . Arthritis   . Asthma   . Carpal tunnel syndrome    Bilateral  . Diabetes mellitus without complication (HCC)    Type II  . GERD (gastroesophageal reflux disease)   . Hernia, abdominal   . Hypertension   . Migraine   . OSA (obstructive sleep apnea)   . Plantar fasciitis of right foot   . Pneumonia    hx  . Shingles 11/2015  . Shortness of breath dyspnea    with walking short distances    Past Surgical History:  Procedure Laterality Date  . CARPAL TUNNEL RELEASE Left   . GASTRIC BYPASS    . HEEL SPUR RESECTION Right 08/01/2015   Procedure: HEEL SPUR EXCISIONS;  Surgeon: Marcene Corning, MD;  Location[facility] OR;  Service: Orthopedics;  Laterality: Right;  . HEEL SPUR RESECTION Left 01/27/2016   Procedure: LEFT HEEL SPUR EXCISION AND TENDON ACHILLIES REPAIR;  Surgeon: Marcene Corning,  MD;  Location: [facility] OR;  Service: Orthopedics;  Laterality: Left;  PRONE POSITION  . LAPAROSCOPIC GASTRIC SLEEVE RESECTION    . TUBAL LIGATION       Home Medications:  Prior to Admission medications   Medication Sig Start Date End Date Taking? Authorizing Provider  albuterol (PROVENTIL) (2.5 MG/3ML) 0.083% nebulizer solution Take 2.5 mg by nebulization every 6 (six) hours as needed for wheezing or shortness of breath.    Yes [provider]  Ascorbic Acid (VITAMIN C PO) Take 1 tablet by mouth at bedtime.   Yes [provider]  BREO ELLIPTA 200-25 MCG/INH  AEPB INHALE 1 PUFF INTO LUNGS DAILY Patient taking differently: Inhale 1 puff into the lungs daily.  03/30/16  Yes Kalman Shanamaswamy, Murali, MD  cetirizine (ZYRTEC) 10 MG tablet Take 10 mg by mouth daily as needed for allergies.    Yes [provider]  Cyanocobalamin (VITAMIN B-12 PO) Take 1 tablet by mouth at bedtime.   Yes [provider]  dicyclomine (BENTYL) 10 MG capsule Take 10 mg by mouth at bedtime.   Yes [provider]  Galcanezumab-gnlm (EMGALITY) 120 MG/ML SOAJ Inject 120 mLs into the skin every 30 (thirty) days. For migraine prevention   Yes [provider]  IRON PO Take 1 tablet by mouth at bedtime.   Yes [provider]  omeprazole (PRILOSEC) 20 MG capsule Take 20 mg by mouth at bedtime.   Yes [provider]  pregabalin (LYRICA) 75 MG capsule Take 75 mg by mouth daily as needed (nerve pain).    Yes [provider]  PROVENTIL HFA 108 (90 Base) MCG/ACT inhaler INHALE 2 PUFFS INTO THE LUNGS EVERY 6 HOURS AS NEEDED FOR WHEEZING OR SHORTNESS OF BREATH Patient taking differently: Inhale 2 puffs into the lungs every 6 (six) hours as needed for wheezing or shortness of breath.  03/17/17  Yes Bevelyn NgoGroce, Sarah F, NP  ursodiol (ACTIGALL) 250 MG tablet Take 250 mg by mouth at bedtime. 01/08/18  Yes [provider]  amLODipine (NORVASC) 5 MG tablet TAKE 1 TABLET BY MOUTH DAILY Patient not taking: Reported on 08/29/2017 07/06/17   Azalee CourseMeng, Hao, PA  diphenhydrAMINE (BENADRYL) 25 MG tablet Take 1 tablet (25 mg total) by mouth every 6 (six) hours. Patient not taking: Reported on 02/13/2018 09/24/17   Sabas SousBero, Michael M, MD  escitalopram (LEXAPRO) 10 MG tablet TAKE 1 TABLET DAILY Patient not taking: Reported on 09/24/2017 08/09/17   Arfeen, Phillips GroutSyed T, MD  lisinopril (PRINIVIL,ZESTRIL) 40 MG tablet TAKE 1 TABLET(40 MG) BY MOUTH DAILY Patient not taking: No sig reported 07/28/17   Chrystie NoseHilty, Kenneth C, MD  pantoprazole (PROTONIX) 40 MG tablet Take 1 tablet (40 mg  total) by mouth daily. Patient not taking: Reported on 02/13/2018 01/10/18   Dione BoozeGlick, David, MD  valsartan (DIOVAN) 160 MG tablet TAKE 1 TABLET(160 MG) BY MOUTH DAILY Patient not taking: No sig reported 07/06/17   Chrystie NoseHilty, Kenneth C, MD    Inpatient Medications: Scheduled Meds: . enoxaparin (LOVENOX) injection  40 mg Subcutaneous Q24H  . feeding supplement (GLUCERNA SHAKE)  237 mL Oral TID BM  . fluticasone furoate-vilanterol  1 puff Inhalation Daily  . insulin aspart  0-5 Units Subcutaneous QHS  . insulin aspart  0-9 Units Subcutaneous TID WC  . lisinopril  40 mg Oral Daily  . loratadine  10 mg Oral Daily  . pantoprazole  40 mg Oral Daily  . pregabalin  75 mg Oral BID   Continuous Infusions: . 0.9 %  NaCl with KCl 20 mEq / L 100 mL/hr at 02/14/18 0026   PRN Meds: albuterol, ondansetron (ZOFRAN) IV  Allergies:    Allergies  Allergen Reactions  . Aspirin Shortness Of Breath and Swelling  . Nsaids Shortness Of Breath and Swelling  . Penicillins Anaphylaxis, Swelling, Rash and Other (See Comments)    PCN reaction causing immediate rash, facial/tongue/throat swelling, SOB or lightheadedness with hypotension: YES PCN reaction causing severe rash involving mucus membranes or skin necrosis: YES PCN reaction that required hospitalization YES PCN reaction occurring within the last 10 years: NO   . Tolmetin Shortness Of Breath and Swelling  . Oxycodone-Acetaminophen Hives and Itching  . Percocet [Oxycodone-Acetaminophen] Hives and Itching  . Vicodin [Hydrocodone-Acetaminophen] Hives and Itching  . Adhesive [Tape] Itching and Rash  . Tramadol Nausea Only    Social History:   Social History   Socioeconomic History  . Marital status: Widowed    Spouse name: Not on file  . Number of children: 2  . Years of education: Not on file  . Highest education level: Not on file  Occupational History  . Occupation: Building surveyor  Social Needs  . Financial resource strain: Not hard at all  .  Food insecurity:    Worry: Never true    Inability: Never true  . Transportation needs:    Medical: No    Non-medical: No  Tobacco Use  . Smoking status: Never Smoker  . Smokeless tobacco: Never Used  . Tobacco comment: years ago may have smoked 1 cig or less a week  Substance and Sexual Activity  . Alcohol use: No    Alcohol/week: 0.0 standard drinks  . Drug use: No  . Sexual activity: Not on file  Lifestyle  . Physical activity:    Days per week: 5 days    Minutes per session: 30 min  . Stress: Very much  Relationships  . Social connections:    Talks on phone: More than three times a week    Gets together: Once a week    Attends religious service: More than 4 times per year    Active member of club or organization: No    Attends meetings of clubs or organizations: Never    Relationship status: Widowed  . Intimate partner violence:    Fear of current or ex partner: No    Emotionally abused: No    Physically abused: No    Forced sexual activity: No  Other Topics Concern  . Not on file  Social History Narrative  . Not on file    Family History:    Family History  Problem Relation Age of Onset  . Hypertension Mother   . Pulmonary embolism Mother   . Depression Mother   . Breast cancer Maternal Aunt   . Brain cancer Cousin   . CAD Maternal Grandmother   . Microcephaly Maternal Grandmother   . Heart attack Maternal Grandmother      ROS:  Please see the history of present illness.  Patient denies fevers, chills, productive cough, hemoptysis or melena.  No diarrhea. All other ROS reviewed and negative.     Physical Exam/Data:   Vitals:   02/13/18 2331 02/14/18 0609 02/14/18 0844 02/14/18 0846  BP: 119/66     Pulse: (!) 50   (!) 40  Resp: 16 18    Temp: 97.9 F (36.6 C) 98 F (36.7 C)  97.7 F (36.5 C)  TempSrc: Oral Oral  Oral  SpO2: 97% 98%  99% 97%  Weight:      Height:        Intake/Output Summary (Last 24 hours) at 02/14/2018 1119 Last data filed  at 02/14/2018 0918 Gross per 24 hour  Intake 366.67 ml  Output -  Net 366.67 ml   Last 3 Weights 02/13/2018 01/09/2018 09/24/2017  Weight (lbs) 287 lb 290 lb 325 lb 13.4 oz  Weight (kg) 130.182 kg 131.543 kg 147.8 kg  Some encounter information is confidential and restricted. Go to Review Flowsheets activity to see all data.     Body mass index is 47.76 kg/m.  General:  Well nourished, well developed, in no acute distress HEENT: normal Neck: no JVD, exam difficult Vascular: No carotid bruits Cardiac:  normal S1, S2; regular rhythm, bradycardic rate; no murmur Lungs:  clear to auscultation bilaterally, no wheezing, rhonchi or rales  Abd: soft, nontender, no hepatomegaly  Ext: no edema Musculoskeletal:  No deformities, BUE and BLE strength normal and equal Skin: warm and dry  Neuro:  CNs 2-12 intact, no focal abnormalities noted Psych:  Normal affect   EKG:  The EKG was personally reviewed and demonstrates:  Sinus bradycardia Telemetry:  Telemetry was personally reviewed and demonstrates:  Sinus bradycardia  Relevant CV Studies:  Coronary CT 2018 IMPRESSION: 1. Coronary calcium score of 0. This was 0 percentile for age and sex matched control.  2. Normal coronary origin with right dominance.  3. No evidence of CAD in the proximal to mid portions of all three coronary arteries. Interpretation of the distal portions and smaller branches precluded by significant artifact sec to patients size and poor contrast opacification.  4. Dilated pulmonary artery measuring 34 mm suspicious for pulmonary hypertension.   Laboratory Data:  Chemistry Recent Labs  Lab 02/13/18 1210 02/13/18 2034 02/13/18 2349  NA 139  --  140  K 3.6  --  3.1*  CL 106  --  109  CO2 22  --  24  GLUCOSE 83  --  129*  BUN 10  --  10  CREATININE 1.13* 0.99 1.08*  CALCIUM 8.9  --  8.5*  GFRNONAA 57* >60 >60  GFRAA >60 >60 >60  ANIONGAP 11  --  7    Recent Labs  Lab 02/13/18 1210  PROT 6.6    ALBUMIN 3.6  AST 15  ALT 14  ALKPHOS 57  BILITOT 0.6   Hematology Recent Labs  Lab 02/13/18 1210 02/13/18 2034 02/13/18 2349  WBC 3.6* 4.8 4.4  RBC 4.26 4.19 3.96  HGB 11.9* 11.6* 11.3*  HCT 37.4 36.6 34.1*  MCV 87.8 87.4 86.1  MCH 27.9 27.7 28.5  MCHC 31.8 31.7 33.1  RDW 15.5 15.2 15.1  PLT 194 202 192   Cardiac Enzymes Recent Labs  Lab 02/13/18 2034 02/13/18 2349  TROPONINI <0.03 <0.03    Recent Labs  Lab 02/13/18 1159  TROPIPOC 0.00    BNPNo results for input(s): BNP, PROBNP in the last 168 hours.  DDimer No results for input(s): DDIMER in the last 168 hours.  Radiology/Studies:  Dg Chest 2 View  Result Date: 02/13/2018 CLINICAL DATA:  Syncope. EXAM: CHEST - 2 VIEW COMPARISON:  Chest x-ray dated 01/09/2018 FINDINGS: The heart size and mediastinal contours are within normal limits. Both lungs are clear. The visualized skeletal structures are unremarkable. IMPRESSION: Normal exam. Electronically Signed   By: Francene Boyers M.D.   On: 02/13/2018 12:33    Assessment and Plan:   1. Syncope, sinus bradycardia in the 40s -  pt presents after a witnessed syncopal episode preceeded by lightheadedness, dizziness, and feeling hot; LOC but unknown time down - CT coronary in 2018 negative for blockages and calcium score of zero - low suspicion for CAD as a cause for her syncope; troponin today negative - echocardiogram - read pending - no carotid bruits, recommend carotid US - pt is not on rate lowering agents - Hb 11.9 - electrolytes WNL, sCr 1.13 - bradycardia in the 40s, but no evidence of arrhythmias or heart block on telemetry - recommend placing heart monitor outpatient to rule out arrhythmias as the cause of her syncope   2. HTN - home medications include valsartan, norvasc, and lisinopril - orthostatic vitals negative for hypotension following IVF resucitation   3. DM previously on insulin 4. Obesity, s/p gastric sleeve, poor PO intake since surgery -  poor PO intake in the setting of DM no longer on insulin, is likely contributing to her syncopal episodes - encouraged her to follow up with GI at [facility] [address] - BG on arrival 77 - A1c 5.5% - albumin 3.6, protein 6.6   For questions or updates, please contact [facility] Please consult [email or web address] for contact info under     Signed, Marcelino Duster, PA  02/14/2018 11:19 AM As above, patient seen and examined.  Briefly she is a 48 year old female with past medical history of diabetes mellitus, hypertension, obstructive sleep apnea, morbid obesity status post gastric sleeve for evaluation of syncope and bradycardia.  Patient had a CTA March 2018 that showed no coronary disease.  Patient had gastric sleeve July 2019.  Since then she has had decreased p.o. intake because of pain in her epigastric area.  She has some dizziness with standing.  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  No chest pain, dyspnea, palpitations.  She had frank syncope for unclear duration but short by her report.  No loss of sensation in her extremities, no incontinence and no seizure activity.  She was noted to be bradycardic and cardiology asked to evaluate.  Troponin is normal.  Electrocardiogram shows marked sinus bradycardia at a rate of 45.  Echocardiogram shows normal LV function.  1 syncope-she has had difficulties with orthostatic symptoms at times since her gastric sleeve was placed.  I have asked her to increase fluid intake as much as possible (she has some difficulty as when she swallows it causes epigastric pain).  Symptoms yesterday sound to potentially be vagally mediated.  Echocardiogram shows normal LV function.  Would arrange outpatient event monitor in setting of bradycardia.  Patient instructed not to drive for 6 months.  2 bradycardia-patient has sinus bradycardia but unclear that that contributed to her syncope.  We will arrange event monitor  as outlined above.  I will also arrange an exercise treadmill to evaluate chronotropic competence with exercise.  No indication for pacemaker if indeed she is found to have asymptomatic sinus bradycardia.  3 previous gastric sleeve-she will need follow-up with her physicians at [facility] to address her poor p.o. intake related to her previous procedure.  4 hypertension-blood pressure is controlled.  Continue present medications and follow.  Patient can be discharged from a cardiac standpoint with plan as outlined above.  She should follow-up with Dr. [name] in 4 weeks. [facility] will sign off.   Medication Recommendations: Continue present medications as listed in MAR. Other recommendations (labs, testing, etc): Outpatient event monitor and exercise treadmill; no driving for 6 months.  Follow up as an outpatient: Dr. Rennis GoldenHilty 4 weeks.   Olga MillersBrian Alcie Runions, MD

## 2018-02-14 NOTE — Discharge Summary (Signed)
Melanie Hess, is a 48 y.o. female  DOB 10-18-1970  MRN [id number].  Admission date:  02/13/2018  Admitting Physician  Barnetta Chapel, MD  Discharge Date:  02/14/2018   Primary MD  [name], MD  Recommendations for primary care physician for things to follow:   - Subclinical hypothyroidism found will need follow-up thyroid studies in 6 to 8 weeks - Recheck a BMP  Discharge Diagnosis    Active Problems:   Morbid obesity (HCC)   Anemia   Diabetes mellitus type 2, controlled, without complications (HCC)   Moderate persistent asthma   Syncope   Hypokalemia       Past Medical History:  Diagnosis Date  . Ankle fracture, right 2001  . Anxiety   . Arthritis   . Asthma   . Carpal tunnel syndrome    Bilateral  . Diabetes mellitus without complication (HCC)    Type II  . GERD (gastroesophageal reflux disease)   . Hernia, abdominal   . Hypertension   . Migraine   . OSA (obstructive sleep apnea)   . Plantar fasciitis of right foot   . Pneumonia    hx  . Shingles 11/2015  . Shortness of breath dyspnea    with walking short distances    Past Surgical History:  Procedure Laterality Date  . CARPAL TUNNEL RELEASE Left   . GASTRIC BYPASS    . HEEL SPUR RESECTION Right 08/01/2015   Procedure: HEEL SPUR EXCISIONS;  Surgeon: Marcene Corning, MD;  [facility];  Service: Orthopedics;  Laterality: Right;  . HEEL SPUR RESECTION Left 01/27/2016   Procedure: LEFT HEEL SPUR EXCISION AND TENDON ACHILLIES REPAIR;  Surgeon: Marcene Corning, MD;  Location: [facility] OR;  Service: Orthopedics;  Laterality: Left;  PRONE POSITION  . LAPAROSCOPIC GASTRIC SLEEVE RESECTION    . TUBAL LIGATION         HPI  from the history and physical done on the day of admission:  Patient is a 48 year old female with past medical history  significant for diabetes mellitus, hypertension, morbid obesity for which patient has undergone sleeve surgery amongst other past medical history.  According to the patient, she underwent the cyst surgery around June 2019 and has lost more than 100 pounds.  Patient weighed 406 pounds prior to the surgery, and now weighs in the 200s.  Patient reported having 2 episodes of syncope while at work.  Patient described was feeling "lightheaded", spinning of the head, feeling very hot inside and eventually passing out.  Following the surgery, patient has had  poor p.o. intake (both liquid and solid).  Patient seems volume depleted.  No recent history of URI symptoms or ear problems.  Patient has had syncopal episodes in the past, but no further details.  No headache, no neck pain, no fever or chills, no chest pain, no shortness of breath, no GI symptoms and no urinary symptoms.  Work-up done revealed significant bradycardia (45 bpm).  BUN is 10, with serum creatinine of 1.13.  Point-of-care troponin is 0.00.  Hemoglobin is 11.9 with hematocrit of 37.4.  Reveals specific gravity of 1.019.  Chest x-ray has not revealed any acute findings.  Patient will be admitted for further assessment and management.  ED Course: Patient was volume resuscitated.  Above work-up was done by the ER provider.  TSH has been added to patient's work-up.    Hospital Course:   1.  Syncope: Patient reports having 2 episodes of syncope while at work.  She reported poor p.o. intake.  Orthostatic vital signs were seen to be positive with diastolic blood pressure drops from lying to sitting.  However, did not not appear consistent when standing.  Patient also seen to be bradycardic.  Echocardiogram revealed EF of 60- 65% with normal systolic function and indeterminate diastolic filling pressures.  Dr. Jens Som cardiology consulted and recommended Holter monitor to evaluate for the possibility of arrhythmia and set patient up for exercise stress  test. Encouraged patient to keep hydrated.  Advised regarding the restriction of from driving for at least 6 months.  2.  Sinus bradycardia: Acute.  Heart rates in the upper 30s - 40s during much of the day.  Echocardiogram has been ordered.  Holter monitor to be set up in outpatient setting.   3.  Epigastric discomfort/GERD: Patient with previous studies including upper GI series showing mild esophageal dysmotility with spontaneous gastroesophageal reflux in 02/2016 and mild tertiary contractions in the mid distal esophagus noted on barium esophagogram from 05/2017.  Patient followed in outpatient setting by Dr. Lily Peer [facility]. Will need to reschedule he outpatient follow-up with GI.  Patient was given information on foods to decrease acid reflux.  4.  Essential hypertension: Patient reports not taking any of her blood pressure medications including Diovan, lisinopril, or amlodipine.  Blood pressures noted to range from 108/61 -169/92.  Recommending her to follow-up with her primary care provider to determine if need of restarting these medications.  5.  Subclinical hypothyroidism: On admission TSH 1.665 and free T4 0.79.  Will need to repeat thyroid testing in 6 to 8 weeks.  6.  Diabetes mellitus type 2: Last hemoglobin A1c noted to be 5.5 on 02/13/2018.  Patient not on any oral hypoglycemic agents.  Recommend carb modified diet.  7.  Morbid obesity: Patient status post gastric sleeve procedure in June 2019.  Patient reports being down approximately 100 pounds since the procedure.  8.  Normocytic anemia: Stable.  Hemoglobin 11.9 on admission.  No reports of bleeding noted.  9.  Hypokalemia: Replaced.  Potassium noted to be around 3.1 patient was given 20 mEq of potassium chloride p.o. and had been on normal saline IV with 20 mEq of potassium chloride fluids.  Patient can follow-up as an outpatient for repeat blood work.  10.  Asthma mild intermittent: Patient without significant signs  of wheezing.  Continue on home inhalers    Follow UP  Follow-up Information    [name], MD. Schedule an appointment as soon as possible for a visit in 1 week(s).   Specialty:  Family Medicine Why:  Call and make an appointment for 1 week Contact information: [address] [place] [address] [phone]        Chrystie NoseHilty, Kenneth C, MD .   Specialty:  Cardiology Contact information: [address] [place] [address] [phone] March [birth year]           Consults obtained: Cardiology, Olga MillersBrian Crenshaw  Discharge Condition: stable  Diet and Activity recommendation: See Discharge Instructions below   Discharge Instructions    Ambulatory referral to Cardiology   Complete by:  As directed    Of note Crenshaw saw inpatient and arranging for event monitor as well.   Diet - low sodium heart healthy   Complete by:  As directed    Discharge instructions   Complete by:  As directed    You were evaluated during this hospitalization for syncope.  It is thought that the symptoms may have likely been due to you not taking in adequate fluids.  You will be set up with a Holter monitor to evaluate possibility of a irregular heart rhythm as the cause of your syncopal episode.  Due to the fact that you had a syncopal episode it is advised that you do not drive for 6 months until evaluated and cleared by your doctor.  I recommended that you increase Prilosec to twice daily.  Please set up appointment with your primary care provider within 1 week, cardiology Dr. Rennis GoldenHilty as previously advised, and [facility].   Increase activity slowly   Complete by:  As directed         Discharge Medications     Allergies as of 02/14/2018      Reactions   Aspirin Shortness Of Breath, Swelling   Nsaids Shortness Of Breath, Swelling   Penicillins Anaphylaxis, Swelling, Rash, Other (See Comments)   PCN reaction  causing immediate rash, facial/tongue/throat swelling, SOB or lightheadedness with hypotension: YES PCN reaction causing severe rash involving mucus membranes or skin necrosis: YES PCN reaction that required hospitalization YES PCN reaction occurring within the last 10 years: NO   Tolmetin Shortness Of Breath, Swelling   Oxycodone-acetaminophen Hives, Itching   Percocet [oxycodone-acetaminophen] Hives, Itching   Vicodin [hydrocodone-acetaminophen] Hives, Itching   Adhesive [tape] Itching, Rash   Tramadol Nausea Only      Medication List    STOP taking these medications   amLODipine 5 MG tablet Commonly known as:  NORVASC   diphenhydrAMINE 25 MG tablet Commonly known as:  BENADRYL   escitalopram 10 MG tablet Commonly known as:  LEXAPRO   lisinopril 40 MG tablet Commonly known as:  PRINIVIL,ZESTRIL   pantoprazole 40 MG tablet Commonly known as:  PROTONIX   valsartan 160 MG tablet Commonly known as:  DIOVAN     TAKE these medications   albuterol (2.5 MG/3ML) 0.083% nebulizer solution Commonly known as:  PROVENTIL Take 2.5 mg by nebulization every 6 (six) hours as needed for wheezing or shortness of breath. What changed:  Another medication with the same name was changed. Make sure you understand how and when to take each.   PROVENTIL HFA 108 (90 Base) MCG/ACT inhaler Generic drug:  albuterol INHALE 2 PUFFS INTO THE LUNGS EVERY 6 HOURS AS NEEDED FOR WHEEZING OR SHORTNESS OF BREATH What changed:  See the new instructions.   BREO ELLIPTA 200-25 MCG/INH Aepb Generic drug:  fluticasone furoate-vilanterol INHALE 1 PUFF INTO LUNGS DAILY What changed:  See the  new instructions.   cetirizine 10 MG tablet Commonly known as:  ZYRTEC Take 10 mg by mouth daily as needed for allergies.   dicyclomine 10 MG capsule Commonly known as:  BENTYL Take 10 mg by mouth at bedtime.   EMGALITY 120 MG/ML Soaj Generic drug:  Galcanezumab-gnlm Inject 120 mLs into the skin every 30  (thirty) days. For migraine prevention   IRON PO Take 1 tablet by mouth at bedtime.   omeprazole 20 MG capsule Commonly known as:  PRILOSEC Take 1 capsule (20 mg total) by mouth 2 (two) times daily before a meal. What changed:  when to take this   pregabalin 75 MG capsule Commonly known as:  LYRICA Take 75 mg by mouth daily as needed (nerve pain).   ursodiol 250 MG tablet Commonly known as:  ACTIGALL Take 250 mg by mouth at bedtime.   VITAMIN B-12 PO Take 1 tablet by mouth at bedtime.   VITAMIN C PO Take 1 tablet by mouth at bedtime.       Major procedures and Radiology Reports - PLEASE review detailed and final reports for all details, in brief -   Echocardiogram 02/14/2018: Impression revealed EF of 60 to 65% with indeterminate pattern of diastolic filling.  There was mild pulmonic valve regurgitation and the empiric vena cava was dilated in size with >50% respiratory variability.   Dg Chest 2 View  Result Date: 02/13/2018 CLINICAL DATA:  Syncope. EXAM: CHEST - 2 VIEW COMPARISON:  Chest x-ray dated 01/09/2018 FINDINGS: The heart size and mediastinal contours are within normal limits. Both lungs are clear. The visualized skeletal structures are unremarkable. IMPRESSION: Normal exam. Electronically Signed   By: Francene Boyers M.D.   On: 02/13/2018 12:33    Micro Results     No results found for this or any previous visit (from the past 240 hour(s)).     Today   Subjective    Novali Vollman reports that she is still having epigastric pain and discomfort with eating or drinking.  She had an appointment with Dr. Lily Peer of gastroenterology at [facility] yesterday that she will need to reschedule.  Denies having any significant chest pain or palpitations.  She reports that she has not been taking any of her blood pressure medications since home her gastric sleeve procedure in 2019.    Objective   Blood pressure 104/78, pulse (!) 42,  temperature 98 F (36.7 C), temperature source Oral, resp. rate 18, height 5\' 5"  (1.651 m), weight 130.2 kg, last menstrual period 02/13/2017, SpO2 96 %.   Intake/Output Summary (Last 24 hours) at 02/14/2018 2027 Last data filed at 02/14/2018 9604 Gross per 24 hour  Intake 366.67 ml  Output -  Net 366.67 ml    Exam  Constitutional: Morbidly obese female in NAD, calm, comfortable Eyes: PERRL, lids and conjunctivae normal ENMT: Mucous membranes are dry. Posterior pharynx clear of any exudate or lesions.  Neck: normal, supple, no masses, no thyromegaly Respiratory: Decreased aeration, clear to auscultation bilaterally, no wheezing, no crackles. Normal respiratory effort. No accessory muscle use.  Cardiovascular: Regular rate and rhythm, no murmurs / rubs / gallops. No extremity edema. 2+ pedal pulses. No carotid bruits.  Abdomen: Protuberant abdomen, no tenderness, no masses palpated. No hepatosplenomegaly. Bowel sounds positive.  Musculoskeletal: no clubbing / cyanosis. No joint deformity upper and lower extremities. Good ROM, no contractures. Normal muscle tone.  Skin: no rashes, lesions, ulcers. No induration Neurologic: CN 2-12 grossly intact. Sensation intact, DTR normal.  Strength 5/5 in all 4.  Psychiatric: Normal judgment and insight. Alert and oriented x 3. Normal mood.    Data Review   CBC w Diff:  Lab Results  Component Value Date   WBC 4.4 02/13/2018   HGB 11.3 (L) 02/13/2018   HCT 34.1 (L) 02/13/2018   PLT 192 02/13/2018   LYMPHOPCT 42 02/13/2018   MONOPCT 10 02/13/2018   EOSPCT 3 02/13/2018   BASOPCT 0 02/13/2018    CMP:  Lab Results  Component Value Date   NA 140 02/13/2018   K 3.1 (L) 02/13/2018   CL 109 02/13/2018   CO2 24 02/13/2018   BUN 10 02/13/2018   CREATININE 1.08 (H) 02/13/2018   PROT 6.6 02/13/2018   ALBUMIN 3.6 02/13/2018   BILITOT 0.6 02/13/2018   ALKPHOS 57 02/13/2018   AST 15 02/13/2018   ALT 14 02/13/2018  .   Total Time in preparing  paper work, data evaluation and todays exam - 35 minutes  Clydie Braunondell A Yuette Putnam M.D on 02/14/2018 at 8:27 PM  [facility]   Office  [phone]

## 2018-02-15 ENCOUNTER — Telehealth: Payer: Self-pay | Admitting: Internal Medicine

## 2018-02-15 NOTE — Telephone Encounter (Signed)
-----   Message from Marcelino Duster, [place] sent at 02/14/2018  2:03 PM EST ----- Patient also needs a treadmill test for chronotropic competence.  Thanks Angie

## 2018-02-15 NOTE — Telephone Encounter (Signed)
LVM for patient to call office back to schedule ETT and CARDIAC EVENT MONITOR appts, as well as follow up with Dr. Rennis GoldenHilty or APP in 1 weeks as request per Micah FlesherAngela Duke.

## 2018-02-21 ENCOUNTER — Ambulatory Visit: Payer: Medicaid Other | Admitting: Physician Assistant

## 2018-02-21 ENCOUNTER — Encounter: Payer: Self-pay | Admitting: Physician Assistant

## 2018-02-21 VITALS — BP 140/90 | HR 65 | Ht 65.0 in | Wt 285.0 lb

## 2018-02-21 DIAGNOSIS — I10 Essential (primary) hypertension: Secondary | ICD-10-CM | POA: Diagnosis not present

## 2018-02-21 DIAGNOSIS — E119 Type 2 diabetes mellitus without complications: Secondary | ICD-10-CM

## 2018-02-21 DIAGNOSIS — Z9889 Other specified postprocedural states: Secondary | ICD-10-CM

## 2018-02-21 DIAGNOSIS — G4733 Obstructive sleep apnea (adult) (pediatric): Secondary | ICD-10-CM

## 2018-02-21 DIAGNOSIS — Z9989 Dependence on other enabling machines and devices: Secondary | ICD-10-CM

## 2018-02-21 DIAGNOSIS — R55 Syncope and collapse: Secondary | ICD-10-CM | POA: Diagnosis not present

## 2018-02-21 NOTE — Patient Instructions (Addendum)
Medication Instructions:  Your physician recommends that you continue on your current medications as directed. Please refer to the Current Medication list given to you today. If you need a refill on your cardiac medications before your next appointment, please call your pharmacy.   Lab work: None  If you have labs (blood work) drawn today and your tests are completely normal, you will receive your results only by: Marland Kitchen MyChart Message (if you have MyChart) OR . A paper copy in the mail If you have any lab test that is abnormal or we need to change your treatment, we will call you to review the results.  Testing/Procedures: None  Follow-Up: At [facility], you and your health needs are our priority.  As part of our continuing mission to provide you with exceptional heart care, we have created designated Provider Care Teams.  These Care Teams include your primary Cardiologist (physician) and Advanced Practice Providers (APPs -  Physician Assistants and Nurse Practitioners) who all work together to provide you with the care you need, when you need it.  . Your physician recommends that you schedule a follow-up appointment in: move appt with Dr [name] to later in the month. Appt scheduled for 03-21-2018.  Any Other Special Instructions Will Be Listed Below (If Applicable).

## 2018-02-21 NOTE — Progress Notes (Signed)
Cardiology Office Note    Date:  02/23/2018   ID:  [name], DOB April 27, 1970, MRN [id number]  PCP:  [name], MD  Cardiologist: Dr. [name]  Chief Complaint  Patient presents with  . Follow-up    seen for Dr. [name]    History of Present Illness:  Melanie Hess is a 48 y.o. female with PMH of HTN, DM II, OSA on CPAP, and morbid obesity s/p gastric sleeve.  Patient was previously seen in March 2018 for consultation secondary to to chest pain.  Coronary CT found no evidence of CAD and a coronary calcium score of 0.  He has not been seen by cardiology service since.  He did have significant weight loss since sleeve surgery in July 2019.  She recently presented to the hospital with 2 syncopal episode.  She described lightheadedness, had spinning and felt very hot prior to passing out.  She also had poor oral intake since sleeve surgery secondary to epigastric pain with eating and drinking.  Her first syncopal episode occurred last summer after her sleep surgery.  She was working at [employer] at the time standing on her feet up when she began to have lightheadedness and a feeling hot.  She had another episode of lightheadedness that prompted her to seek medical attention this time.  On arrival, she was bradycardic in the 40s however no evidence of his arrhythmia or heart block.  It was recommended she increase her fluid intake as her symptom is more consistent with vagal mediated syncope.  Echocardiogram showed normal LV function.  Outpatient event monitor was recommended in the setting of bradycardia.  She was instructed not to drive for 6 months.  Patient presents today for cardiology office visit.  Since discharge, she did have one more episode of dizziness yesterday while at work.  Otherwise, she denies any passing out spell.  She denies any chest pain or shortness of breath.  She is scheduled for treadmill test tomorrow and to pick up event monitor on 12 February.  I will push  back her appointment with Dr. [name] until about 5 weeks after she pick up the event monitor so the results will be ready at the time.   Past Medical History:  Diagnosis Date  . Ankle fracture, right 2001  . Anxiety   . Arthritis   . Asthma   . Carpal tunnel syndrome    Bilateral  . Diabetes mellitus without complication (HCC)    Type II  . GERD (gastroesophageal reflux disease)   . Hernia, abdominal   . Hypertension   . Migraine   . OSA (obstructive sleep apnea)   . Plantar fasciitis of right foot   . Pneumonia    hx  . Shingles 11/2015  . Shortness of breath dyspnea    with walking short distances    Past Surgical History:  Procedure Laterality Date  . CARPAL TUNNEL RELEASE Left   . GASTRIC BYPASS    . HEEL SPUR RESECTION Right 08/01/2015   Procedure: HEEL SPUR EXCISIONS;  Surgeon: Marcene Corning, MD;  Location[facility];  Service: Orthopedics;  Laterality: Right;  . HEEL SPUR RESECTION Left 01/27/2016   Procedure: LEFT HEEL SPUR EXCISION AND TENDON ACHILLIES REPAIR;  Surgeon: Marcene Corning, MD;  Location: [facility] OR;  Service: Orthopedics;  Laterality: Left;  PRONE POSITION  . LAPAROSCOPIC GASTRIC SLEEVE RESECTION    . TUBAL LIGATION      Current Medications: Outpatient Medications Prior to Visit  Medication  Sig Dispense Refill  . albuterol (PROVENTIL) (2.5 MG/3ML) 0.083% nebulizer solution Take 2.5 mg by nebulization every 6 (six) hours as needed for wheezing or shortness of breath.     . Ascorbic Acid (VITAMIN C PO) Take 1 tablet by mouth at bedtime.    Marland Kitchen. BREO ELLIPTA 200-25 MCG/INH AEPB INHALE 1 PUFF INTO LUNGS DAILY (Patient taking differently: Inhale 1 puff into the lungs daily. ) 60 each 6  . cetirizine (ZYRTEC) 10 MG tablet Take 10 mg by mouth daily as needed for allergies.     . Cyanocobalamin (VITAMIN B-12 PO) Take 1 tablet by mouth at bedtime.    . dicyclomine (BENTYL) 10 MG capsule Take 10 mg by mouth at bedtime.    . Galcanezumab-gnlm (EMGALITY) 120 MG/ML SOAJ  Inject 120 mLs into the skin every 30 (thirty) days. For migraine prevention    . IRON PO Take 1 tablet by mouth at bedtime.    Marland Kitchen. omeprazole (PRILOSEC) 20 MG capsule Take 1 capsule (20 mg total) by mouth 2 (two) times daily before a meal. 60 capsule 0  . pregabalin (LYRICA) 75 MG capsule Take 75 mg by mouth daily as needed (nerve pain).     Marland Kitchen. PROVENTIL HFA 108 (90 Base) MCG/ACT inhaler INHALE 2 PUFFS INTO THE LUNGS EVERY 6 HOURS AS NEEDED FOR WHEEZING OR SHORTNESS OF BREATH (Patient taking differently: Inhale 2 puffs into the lungs every 6 (six) hours as needed for wheezing or shortness of breath. ) 6.7 g 0  . ursodiol (ACTIGALL) 250 MG tablet Take 250 mg by mouth at bedtime.     No facility-administered medications prior to visit.      Allergies:   Aspirin; Nsaids; Penicillins; Tolmetin; Oxycodone-acetaminophen; Percocet [oxycodone-acetaminophen]; Vicodin [hydrocodone-acetaminophen]; Adhesive [tape]; and Tramadol   Social History   Socioeconomic History  . Marital status: Widowed    Spouse name: Not on file  . Number of children: 2  . Years of education: Not on file  . Highest education level: Not on file  Occupational History  . Occupation: Building surveyordaycare teacher  Social Needs  . Financial resource strain: Not hard at all  . Food insecurity:    Worry: Never true    Inability: Never true  . Transportation needs:    Medical: No    Non-medical: No  Tobacco Use  . Smoking status: Never Smoker  . Smokeless tobacco: Never Used  . Tobacco comment: years ago may have smoked 1 cig or less a week  Substance and Sexual Activity  . Alcohol use: No    Alcohol/week: 0.0 standard drinks  . Drug use: No  . Sexual activity: Not on file  Lifestyle  . Physical activity:    Days per week: 5 days    Minutes per session: 30 min  . Stress: Very much  Relationships  . Social connections:    Talks on phone: More than three times a week    Gets together: Once a week    Attends religious service:  More than 4 times per year    Active member of club or organization: No    Attends meetings of clubs or organizations: Never    Relationship status: Widowed  Other Topics Concern  . Not on file  Social History Narrative  . Not on file     Family History:  The patient's family history includes Brain cancer in her cousin; Breast cancer in her maternal aunt; CAD in her maternal grandmother; Depression in her mother; Heart attack in  her maternal grandmother; Hypertension in her mother; Microcephaly in her maternal grandmother; Pulmonary embolism in her mother.   ROS:   Please see the history of present illness.    ROS All other systems reviewed and are negative.   PHYSICAL EXAM:   VS:  BP 140/90   Pulse 65   Ht 5\' 5"  (1.651 m)   Wt 285 lb (129.3 kg)   SpO2 95%   BMI 47.43 kg/m    GEN: Well nourished, well developed, in no acute distress  HEENT: normal  Neck: no JVD, carotid bruits, or masses Cardiac: RRR; no murmurs, rubs, or gallops,no edema  Respiratory:  clear to auscultation bilaterally, normal work of breathing GI: soft, nontender, nondistended, + BS MS: no deformity or atrophy  Skin: warm and dry, no rash Neuro:  Alert and Oriented x 3, Strength and sensation are intact Psych: euthymic mood, full affect  Wt Readings from Last 3 Encounters:  02/21/18 285 lb (129.3 kg)  02/13/18 287 lb (130.2 kg)  01/09/18 290 lb (131.5 kg)      Studies/Labs Reviewed:   EKG:  EKG is not ordered today.    Recent Labs: 02/13/2018: ALT 14; BUN 10; Creatinine, Ser 1.08; Hemoglobin 11.3; Magnesium 1.8; Platelets 192; Potassium 3.1; Sodium 140; TSH 1.665   Lipid Panel No results found for: CHOL, TRIG, HDL, CHOLHDL, VLDL, LDLCALC, LDLDIRECT  Additional studies/ records that were reviewed today include:   Echo 02/14/2018 IMPRESSIONS    1. The left ventricle appears to be normal in size, has normal wall thickness 60-65% ejection fraction Spectral Doppler shows indeterminate pattern  of diastolic filling.  2. Right ventricular systolic pressure is could not be assessed.  3. The right ventricle has normal size and normal systolic function.  4. Normal left atrial size.  5. Normal right atrial size.  6. The mitral valve normal in structure and function.  7. Normal tricuspid valve.  8. Aortic valve normal.  9. Pulmonic valve regurgitation is mild by color flow Doppler. 10. The inferior vena cava was dilated in size with >50% respiratory variablity. 11. No atrial level shunt detected by color flow Doppler.  FINDINGS  Left Ventricle: The left ventricle appears to be normal in size, has normal wall thickness normal systolic function with a 60-65% ejection fraction Spectral Doppler shows indeterminate pattern of diastolic filling. Right Ventricle: The right ventricle is normal in size normal wall thickness has normal systolic function. Left Atrium: The left atrium is normal in size. Right Atrium: The right atrial size is normal in size. Interatrial Septum: No atrial level shunt detected by color flow Doppler.    ASSESSMENT:    1. Syncope, unspecified syncope type   2. OSA on CPAP   3. Essential hypertension   4. Controlled type 2 diabetes mellitus without complication, without long-term current use of insulin (HCC)   5. S/P gastric surgery      PLAN:  In order of problems listed above:  1. Syncope: Felt to be vagal mediated syncope.  Upcoming 30-day event monitor to rule out significant arrhythmia.  I advised her to keep with her some saltine crackers at work.  2. Hypertension: Blood pressure borderline elevated, however given the recent syncope, I would not try to control her blood pressure aggressively  3. DM 2: Managed by primary care provider  4. History of obesity s/p gastric sleeve surgery: She continued to have some abdominal issues every time she eats.    Medication Adjustments/Labs and Tests Ordered: Current medicines are  reviewed at length with the  patient today.  Concerns regarding medicines are outlined above.  Medication changes, Labs and Tests ordered today are listed in the Patient Instructions below. Patient Instructions  Medication Instructions:  Your physician recommends that you continue on your current medications as directed. Please refer to the Current Medication list given to you today. If you need a refill on your cardiac medications before your next appointment, please call your pharmacy.   Lab work: None  If you have labs (blood work) drawn today and your tests are completely normal, you will receive your results only by: Marland Kitchen MyChart Message (if you have MyChart) OR . A paper copy in the mail If you have any lab test that is abnormal or we need to change your treatment, we will call you to review the results.  Testing/Procedures: None  Follow-Up: At [facility], you and your health needs are our priority.  As part of our continuing mission to provide you with exceptional heart care, we have created designated Provider Care Teams.  These Care Teams include your primary Cardiologist (physician) and Advanced Practice Providers (APPs -  Physician Assistants and Nurse Practitioners) who all work together to provide you with the care you need, when you need it.  . Your physician recommends that you schedule a follow-up appointment in: move appt with Dr [name] to later in the month. Appt scheduled for 03-21-2018.  Any Other Special Instructions Will Be Listed Below (If Applicable).      Ramond Dial, [place]  02/23/2018 11:26 PM    [facility] [address] [place], [place], [place]  [address] Phone: [phone]; Fax: [phone]

## 2018-02-22 ENCOUNTER — Ambulatory Visit (INDEPENDENT_AMBULATORY_CARE_PROVIDER_SITE_OTHER): Payer: Medicaid Other

## 2018-02-22 DIAGNOSIS — R55 Syncope and collapse: Secondary | ICD-10-CM

## 2018-02-23 ENCOUNTER — Encounter: Payer: Self-pay | Admitting: Physician Assistant

## 2018-02-23 LAB — EXERCISE TOLERANCE TEST
CSEPED: 1 min
Estimated workload: 4.6 METS
Exercise duration (sec): 53 s
MPHR: 171 {beats}/min
Peak HR: 114 {beats}/min
Percent HR: 66 %
RPE: 17
Rest HR: 46 {beats}/min

## 2018-02-24 ENCOUNTER — Telehealth: Payer: Self-pay | Admitting: Physician Assistant

## 2018-02-24 NOTE — Progress Notes (Signed)
The patient has been notified of the result and verbalized understanding.  Patient asked if she still needed the monitor

## 2018-02-24 NOTE — Telephone Encounter (Signed)
Patient states Gaspar Colaerrah called her to go over results of stress test.

## 2018-02-27 NOTE — Progress Notes (Signed)
Patient was informed that the appointment for the monitor is to make sure she does not have an irregular rhythm. She verbalized an understanding and all (if any) questions were answered.

## 2018-03-01 ENCOUNTER — Ambulatory Visit (INDEPENDENT_AMBULATORY_CARE_PROVIDER_SITE_OTHER): Payer: Medicaid Other

## 2018-03-01 ENCOUNTER — Encounter: Payer: Self-pay | Admitting: *Deleted

## 2018-03-01 DIAGNOSIS — R55 Syncope and collapse: Secondary | ICD-10-CM

## 2018-03-21 ENCOUNTER — Ambulatory Visit: Payer: Self-pay | Admitting: Internal Medicine

## 2018-04-06 ENCOUNTER — Telehealth: Payer: Self-pay | Admitting: Internal Medicine

## 2018-04-06 ENCOUNTER — Encounter: Payer: Self-pay | Admitting: *Deleted

## 2018-04-06 ENCOUNTER — Telehealth: Payer: Self-pay | Admitting: Physician Assistant

## 2018-04-06 DIAGNOSIS — R55 Syncope and collapse: Secondary | ICD-10-CM

## 2018-04-06 DIAGNOSIS — R001 Bradycardia, unspecified: Secondary | ICD-10-CM

## 2018-04-06 NOTE — Telephone Encounter (Signed)
Follow Up:      Returning your call from today. 

## 2018-04-06 NOTE — Telephone Encounter (Signed)
New Message:    Patient calling concerning some results she had in hospital. Please call patient back.

## 2018-04-06 NOTE — Addendum Note (Signed)
Addended by: Neta Ehlers on: 04/06/2018 04:41 PM   Modules accepted: Orders

## 2018-04-06 NOTE — Telephone Encounter (Signed)
I spoke to the patient over the phone regarding monitor result. She had another episode of syncope 2 days ago, but did not wish to go to ED due to recent corona virus outbreak. She says her mother was present and urged her to go to ED. Given the bradycardia seen on recent monitor and recurrent syncope, we planned to refer her to EP. Will reach out to EP scheduler to see what is the earliest appointment available.

## 2018-04-07 ENCOUNTER — Institutional Professional Consult (permissible substitution): Payer: Medicaid Other | Admitting: Cardiology

## 2018-04-07 ENCOUNTER — Telehealth: Payer: Self-pay | Admitting: Cardiology

## 2018-04-07 NOTE — Telephone Encounter (Signed)
Spoke with the patient today via virtual visit over the phone.  She has a history of syncope while at work.  Her episode of syncope occurred when she was standing working as a Conservation officer, nature at [employer].  She went to the hospital.  Her episode was thought to be due to vagal episode.  She wore a cardiac monitor for 30 days which showed some sinus bradycardia with rates in the 40s but no other arrhythmias.  She did not have syncope while wearing her monitor.  Prior to her episode of syncope at [employer], she did feel weak and fatigued.  Since that time, she did have one other episode of syncope approximately 1 week ago.  She was coming home from work when she felt weak and fatigued.  She got in the house and had an episode of syncope.  She says that she continues to feel tired, but this has continued since her initial episode of syncope.  It is certainly possible that she may need a pacemaker in the future due to her sinus bradycardia, but will hold off for now.  I have told her to call us back if she has any further issues.  I will see her back in follow-up in 3 months.  Loman Brooklyn, MD

## 2018-04-07 NOTE — Telephone Encounter (Signed)
Via telephone about her episode of syncope and chest pain.  I also reviewed her 30-day cardiac monitor.  She had an episode of syncope while she was at work.  It was thought that this was vagal in nature.  She wore a cardiac monitor that showed sinus rhythm with sinus bradycardia into the 40s.  She did not have an episode of syncope while wearing the monitor.  She felt well after waking up.  She did feel quite dizzy prior to her episode of syncope.  She works as a Conservation officer, nature at [employer].  She did have another episode of syncope when she was getting back from work.  She said that she walked into her house and passed out.  She was feeling dizzy prior to that.  She has a history of gastric bypass with a gastric sleeve.  At this point, it is unclear to me as to the cause of her dizziness.  She did not have significant bradycardia noted on her cardiac monitor that would cause episodes of syncope.  I have told her that we [name] continue to monitor.  I have told her to hydrate well.  If she has any further episodes, she [name] call us back.  I [name] follow-up with her in a few months.  Loman Brooklyn, MD

## 2018-04-12 ENCOUNTER — Telehealth: Payer: Self-pay | Admitting: Cardiology

## 2018-04-12 NOTE — Telephone Encounter (Signed)
Patient called asking when she had been scheduled for appointment.  She did not know who it needed to be with.  She did state that she did the tele-visit on April 07, 2018 and was told during that call that someone would be setting another visit up for her

## 2018-04-13 ENCOUNTER — Other Ambulatory Visit: Payer: Self-pay

## 2018-04-13 ENCOUNTER — Telehealth: Payer: Self-pay | Admitting: Student

## 2018-04-13 NOTE — Telephone Encounter (Signed)
   Received page from answering service that patient was having chest pain. Patient reports onset of substernal chest pain about 1-2 hours ago that she describes as a pressure. Pleuritic in nature and pain 10/10 when she takes a deep breath. Some associated shortness of breath but patient states she does have a history of asthma. Patient had coronary CT on 04/08/2016 which showed no evidence of CAD in the proximal to mid portions of all 3 coronary arteries and a calcium score of 0. Patient also had a ETT on 02/22/2018 no EKG changes; however, exercise effort was suboptimal. Therefore, pain most likely not cardiac in nature. Patient denies any lower extremity edema and states she is active, but could possibly represent pulmonary embolism. Patient would like to avoid coming to the ED with the current COVID-19 pandemic and I agree. This does not sound cardiac in nature. Patient is going to try a breathing treatment at home. If that does not help and she continues to have significant chest pain or develops an other concerning symptoms, she was instructed to call us back or call 911. Patient voiced understanding and agreed.   Corrin Parker, PA-C 04/13/2018 8:21 PM

## 2018-04-18 ENCOUNTER — Ambulatory Visit: Payer: Medicaid Other | Admitting: Internal Medicine

## 2018-07-03 ENCOUNTER — Telehealth: Payer: Self-pay | Admitting: *Deleted

## 2018-07-03 NOTE — Telephone Encounter (Signed)
Calling patient today to discuss upcoming appointment.  We are currently trying to limit exposure to the virus that causes COVID-19 by seeing patients at home rather than in the office. We would like to schedule this appointment as a Psychologist, counselling. Unable to reach patient.

## 2018-07-07 NOTE — Telephone Encounter (Signed)

## 2018-07-11 ENCOUNTER — Other Ambulatory Visit: Payer: Self-pay

## 2018-07-11 ENCOUNTER — Telehealth (INDEPENDENT_AMBULATORY_CARE_PROVIDER_SITE_OTHER): Payer: Medicaid Other | Admitting: Cardiology

## 2018-07-11 DIAGNOSIS — G4733 Obstructive sleep apnea (adult) (pediatric): Secondary | ICD-10-CM | POA: Diagnosis not present

## 2018-07-11 DIAGNOSIS — Z9989 Dependence on other enabling machines and devices: Secondary | ICD-10-CM | POA: Diagnosis not present

## 2018-07-11 NOTE — Progress Notes (Signed)
Electrophysiology TeleHealth Note   Due to national recommendations of social distancing due to COVID 19, an audio/video telehealth visit is felt to be most appropriate for this patient at this time.  See Epic message for the patient's consent to telehealth for [facility].   Date:  07/11/2018   ID:  Melanie Hess, DOB May 28, 1970, MRN [id number]  Location: patient's home  Provider location: [address] [place]  Evaluation Performed: Follow-up visit  PCP:  Lin Landsman, MD  Cardiologist:  Pixie Casino, MD  Electrophysiologist:  Dr Curt Bears  Chief Complaint:  Near syncope  History of Present Illness:    Melanie Hess is a 48 y.o. female who presents via audio/video conferencing for a telehealth visit today.  Since last being seen in our clinic, the patient reports doing very well.  Today, she denies symptoms of palpitations, chest pain, shortness of breath,  lower extremity edema, dizziness, presyncope, or syncope.  The patient is otherwise without complaint today.  The patient denies symptoms of fevers, chills, cough, or new SOB worrisome for COVID 19.  She has a history significant for hypertension, type 2 diabetes, OSA on CPAP, morbid obesity status post gastric sleeve.  She was seen March 2018 for consultation secondary to chest pain.  CT showed no evidence of coronary artery disease.  She has had 2 syncopal episodes described as lightheadedness and spinning.  She felt very hot prior to passing out.  She had poor oral intake since her gastric sleeve surgery.  After one episode while at [employer] working, she went to the emergency room and was found to be bradycardic with heart rates in the 40s with no evidence of arrhythmia or heart block.  Today, denies symptoms of palpitations, chest pain, shortness of breath, orthopnea, PND, lower extremity edema, claudication, dizziness, presyncope, syncope, bleeding, or neurologic sequela. The patient is  tolerating medications without difficulties.  She is unfortunately continued to have episodes of weakness and fatigue.  She has not had any further episodes of syncope or near syncope.  She says that she can fall asleep at random times throughout the day.  Past Medical History:  Diagnosis Date  . Ankle fracture, right 2001  . Anxiety   . Arthritis   . Asthma   . Carpal tunnel syndrome    Bilateral  . Diabetes mellitus without complication (HCC)    Type II  . GERD (gastroesophageal reflux disease)   . Hernia, abdominal   . Hypertension   . Migraine   . OSA (obstructive sleep apnea)   . Plantar fasciitis of right foot   . Pneumonia    hx  . Shingles 11/2015  . Shortness of breath dyspnea    with walking short distances    Past Surgical History:  Procedure Laterality Date  . CARPAL TUNNEL RELEASE Left   . GASTRIC BYPASS    . HEEL SPUR RESECTION Right 08/01/2015   Procedure: HEEL SPUR EXCISIONS;  Surgeon: Melrose Nakayama, MD;  Location: [place];  Service: Orthopedics;  Laterality: Right;  . HEEL SPUR RESECTION Left 01/27/2016   Procedure: LEFT HEEL SPUR EXCISION AND TENDON ACHILLIES REPAIR;  Surgeon: Melrose Nakayama, MD;  Location: [place];  Service: Orthopedics;  Laterality: Left;  PRONE POSITION  . LAPAROSCOPIC GASTRIC SLEEVE RESECTION    . TUBAL LIGATION      Current Outpatient Medications  Medication Sig Dispense Refill  . albuterol (PROVENTIL) (2.5 MG/3ML) 0.083% nebulizer solution Take 2.5 mg by nebulization  every 6 (six) hours as needed for wheezing or shortness of breath.     . Ascorbic Acid (VITAMIN C PO) Take 1 tablet by mouth at bedtime.    Marland Kitchen. BREO ELLIPTA 200-25 MCG/INH AEPB INHALE 1 PUFF INTO LUNGS DAILY (Patient taking differently: Inhale 1 puff into the lungs daily. ) 60 each 6  . cetirizine (ZYRTEC) 10 MG tablet Take 10 mg by mouth daily as needed for allergies.     . Cyanocobalamin (VITAMIN B-12 PO) Take 1 tablet by mouth at bedtime.    . dicyclomine (BENTYL) 10 MG  capsule Take 10 mg by mouth at bedtime.    . Galcanezumab-gnlm (EMGALITY) 120 MG/ML SOAJ Inject 120 mLs into the skin every 30 (thirty) days. For migraine prevention    . IRON PO Take 1 tablet by mouth at bedtime.    Marland Kitchen. omeprazole (PRILOSEC) 20 MG capsule Take 1 capsule (20 mg total) by mouth 2 (two) times daily before a meal. 60 capsule 0  . pregabalin (LYRICA) 75 MG capsule Take 75 mg by mouth daily as needed (nerve pain).     Marland Kitchen. PROVENTIL HFA 108 (90 Base) MCG/ACT inhaler INHALE 2 PUFFS INTO THE LUNGS EVERY 6 HOURS AS NEEDED FOR WHEEZING OR SHORTNESS OF BREATH (Patient taking differently: Inhale 2 puffs into the lungs every 6 (six) hours as needed for wheezing or shortness of breath. ) 6.7 g 0  . ursodiol (ACTIGALL) 250 MG tablet Take 250 mg by mouth at bedtime.     No current facility-administered medications for this visit.     Allergies:   Aspirin, Nsaids, Penicillins, Tolmetin, Oxycodone-acetaminophen, Vicodin [hydrocodone-acetaminophen], Adhesive [tape], and Tramadol   Social History:  The patient  reports that she has never smoked. She has never used smokeless tobacco. She reports that she does not drink alcohol or use drugs.   Family History:  The patient's  family history includes Brain cancer in her cousin; Breast cancer in her maternal aunt; CAD in her maternal grandmother; Depression in her mother; Heart attack in her maternal grandmother; Hypertension in her mother; Microcephaly in her maternal grandmother; Pulmonary embolism in her mother.   ROS:  Please see the history of present illness.   All other systems are personally reviewed and negative.    Exam:    Vital Signs:  There were no vitals taken for this visit.  Well appearing, alert and conversant, regular work of breathing,  good skin color Eyes- anicteric, neuro- grossly intact, skin- no apparent rash or lesions or cyanosis, mouth- oral mucosa is pink   Labs/Other Tests and Data Reviewed:    Recent Labs: 02/13/2018:  ALT 14; BUN 10; Creatinine, Ser 1.08; Hemoglobin 11.3; Magnesium 1.8; Platelets 192; Potassium 3.1; Sodium 140; TSH 1.665   Wt Readings from Last 3 Encounters:  02/21/18 285 lb (129.3 kg)  02/13/18 287 lb (130.2 kg)  01/09/18 290 lb (131.5 kg)     Other studies personally reviewed: Additional studies/ records that were reviewed today include: 30-day monitor 04/05/2018 personally reviewed  Review of the above records today demonstrates:   Monitor shows bradycardia with episodes of HR in the 40's associated with symptoms - suggestive of symptomatic bradycardia. Not on AVN blockers. EP evaluation may be warranted.  ASSESSMENT & PLAN:    1.  Syncope: At this point, it is unclear to me as to the cause of her syncope.  It could certainly be a vagal episode.  She shows no evidence of heart block on her monitor, though she  is quite fatigued and weak, this occurs with heart rates both in the 40s and 70s.  I do not think a pacemaker would help with these episodes.  I did tell her that I do not think a pacemaker would help her symptoms.  2.  OSA: CPAP compliance encouraged.  She says that she is compliant.  She is continuing to have more weakness and fatigue and I am concerned that her CPAP settings may not be correct.  She has not seen Dr. Tresa EndoKelly since 2017.  We Patrycja Mumpower have her follow-up with him for further adjustment potentially of her CPAP which may help with her fatigue.  3.  Hypertension: Has not been checked.  No medication changes at this time.   COVID 19 screen The patient denies symptoms of COVID 19 at this time.  The importance of social distancing was discussed today.  Follow-up: 3 months  Current medicines are reviewed at length with the patient today.   The patient does not have concerns regarding her medicines.  The following changes were made today:  none  Labs/ tests ordered today include:  No orders of the defined types were placed in this encounter.    Patient Risk:  after full  review of this patients clinical status, I feel that they are at moderate risk at this time.  Today, I have spent 12 minutes with the patient with telehealth technology discussing syncope, weakness, fatigue.    Signed, Rafeef Lau Jorja LoaMartin Latifa Noble, MD  07/11/2018 10:15 AM     [facility] [address] [place] [address] [phone] (office) [phone] (fax)

## 2018-08-31 ENCOUNTER — Emergency Department (HOSPITAL_COMMUNITY): Payer: Medicaid Other

## 2018-08-31 ENCOUNTER — Emergency Department (HOSPITAL_COMMUNITY)
Admission: EM | Admit: 2018-08-31 | Discharge: 2018-08-31 | Disposition: A | Discharge status: Home / Self Care | Payer: Medicaid Other | Attending: Emergency Medicine | Admitting: Emergency Medicine

## 2018-08-31 ENCOUNTER — Encounter (HOSPITAL_COMMUNITY): Payer: Self-pay

## 2018-08-31 ENCOUNTER — Other Ambulatory Visit: Payer: Self-pay

## 2018-08-31 DIAGNOSIS — R55 Syncope and collapse: Secondary | ICD-10-CM | POA: Insufficient documentation

## 2018-08-31 DIAGNOSIS — Z87891 Personal history of nicotine dependence: Secondary | ICD-10-CM | POA: Diagnosis not present

## 2018-08-31 DIAGNOSIS — J45909 Unspecified asthma, uncomplicated: Secondary | ICD-10-CM | POA: Insufficient documentation

## 2018-08-31 DIAGNOSIS — R079 Chest pain, unspecified: Secondary | ICD-10-CM | POA: Diagnosis present

## 2018-08-31 DIAGNOSIS — E119 Type 2 diabetes mellitus without complications: Secondary | ICD-10-CM | POA: Diagnosis not present

## 2018-08-31 DIAGNOSIS — I10 Essential (primary) hypertension: Secondary | ICD-10-CM | POA: Diagnosis not present

## 2018-08-31 DIAGNOSIS — R0789 Other chest pain: Secondary | ICD-10-CM | POA: Insufficient documentation

## 2018-08-31 LAB — TROPONIN I (HIGH SENSITIVITY)
Troponin I (High Sensitivity): 4 ng/L (ref <18)
Troponin I (High Sensitivity): 4 ng/L (ref <18)

## 2018-08-31 LAB — CBC
HCT: 37.1 % (ref 36.0–46.0)
Hemoglobin: 12.2 g/dL (ref 12.0–15.0)
MCH: 28.4 pg (ref 26.0–34.0)
MCHC: 32.9 g/dL (ref 30.0–36.0)
MCV: 86.3 fL (ref 80.0–100.0)
Platelets: 193 10*3/uL (ref 150–400)
RBC: 4.3 MIL/uL (ref 3.87–5.11)
RDW: 14.6 % (ref 11.5–15.5)
WBC: 3.8 10*3/uL — ABNORMAL LOW (ref 4.0–10.5)
nRBC: 0 % (ref 0.0–0.2)

## 2018-08-31 LAB — BASIC METABOLIC PANEL
Anion gap: 9 (ref 5–15)
BUN: 10 mg/dL (ref 6–20)
CO2: 25 mmol/L (ref 22–32)
Calcium: 9.1 mg/dL (ref 8.9–10.3)
Chloride: 106 mmol/L (ref 98–111)
Creatinine, Ser: 1.13 mg/dL — ABNORMAL HIGH (ref 0.44–1.00)
GFR calc Af Amer: >60 mL/min (ref >60)
GFR calc non Af Amer: 57 mL/min — ABNORMAL LOW (ref >60)
Glucose, Bld: 74 mg/dL (ref 70–99)
Potassium: 3.8 mmol/L (ref 3.5–5.1)
Sodium: 140 mmol/L (ref 135–145)

## 2018-08-31 LAB — I-STAT BETA HCG BLOOD, ED (MC, WL, AP ONLY): I-stat hCG, quantitative: <5 m[IU]/mL (ref <5)

## 2018-08-31 LAB — D-DIMER, QUANTITATIVE: D-Dimer, Quant: 0.4 ug/mL-FEU (ref 0.00–0.50)

## 2018-08-31 MED ORDER — ACETAMINOPHEN 500 MG PO TABS
1000.0000 mg | ORAL_TABLET | Freq: Once | ORAL | Status: AC
  Administered 2018-08-31: 16:00:00 1000 mg via ORAL
  Filled 2018-08-31: qty 2

## 2018-08-31 MED ORDER — NYSTATIN 100000 UNIT/GM EX POWD: Freq: Four times a day (QID) | CUTANEOUS | 0 refills | Status: AC

## 2018-08-31 NOTE — ED Triage Notes (Signed)
Pt endorses central chest pressure with lightheadedness since yesterday. Worse with movement. Has chronic bradycardia and has discussed having a pacemaker placed recently. Axox4.

## 2018-08-31 NOTE — Discharge Instructions (Addendum)
You were evaluated in the Emergency Department and after careful evaluation, we did not find any emergent condition requiring admission or further testing in the hospital.  Your testing today was overall reassuring.  We recommend follow-up with your cardiologist and your electrophysiologist.  We spoke with the cardiology team today and you should be able to be seen next week.  Please return to the Emergency Department if you experience any worsening of your condition.  We encourage you to follow up with a primary care provider.  Thank you for allowing [place] to be a part of your care.

## 2018-08-31 NOTE — ED Provider Notes (Signed)
[facility] Emergency Department Provider Note MRN:  [id number]  Arrival date & time: 08/31/18     Chief Complaint   Chest Pain   History of Present Illness   [name] is a 48 y.o. year-old female with a history of diabetes, gastric bypass presenting to the ED with chief complaint of chest pain.  Patient is endorsing central sharp chest pain that is worse when bending forward, radiates to her mid thoracic back, worse with some motions of the left arm.  Pain began earlier today, gradual onset, has been constant.  Denies shortness of breath.  Patient also endorsing syncopal episode yesterday.  She was diagnosed with bradycardia and had syncopal episodes a few months ago, has been followed up with cardiology and they are discussing a pacemaker.  Denies leg pain or swelling, no fever, no cough.  No traumatic injuries or head trauma from the fall yesterday.  Review of Systems  A complete 10 system review of systems was obtained and all systems are negative except as noted in the HPI and PMH.   Patient's Health History    Past Medical History:  Diagnosis Date  . Ankle fracture, right 2001  . Anxiety   . Arthritis   . Asthma   . Carpal tunnel syndrome    Bilateral  . Diabetes mellitus without complication (HCC)    Type II  . GERD (gastroesophageal reflux disease)   . Hernia, abdominal   . Hypertension   . Migraine   . OSA (obstructive sleep apnea)   . Plantar fasciitis of right foot   . Pneumonia    hx  . Shingles 11/2015  . Shortness of breath dyspnea    with walking short distances    Past Surgical History:  Procedure Laterality Date  . CARPAL TUNNEL RELEASE Left   . GASTRIC BYPASS    . HEEL SPUR RESECTION Right 08/01/2015   Procedure: HEEL SPUR EXCISIONS;  Surgeon: Marcene CorningPeter Dalldorf, MD;  [facility];  Service: Orthopedics;  Laterality: Right;  . HEEL SPUR RESECTION Left 01/27/2016   Procedure: LEFT HEEL SPUR EXCISION AND TENDON ACHILLIES  REPAIR;  Surgeon: Marcene CorningPeter Dalldorf, MD;  [facility];  Service: Orthopedics;  Laterality: Left;  PRONE POSITION  . LAPAROSCOPIC GASTRIC SLEEVE RESECTION    . TUBAL LIGATION      Family History  Problem Relation Age of Onset  . Hypertension Mother   . Pulmonary embolism Mother   . Depression Mother   . Breast cancer Maternal Aunt   . Brain cancer Cousin   . CAD Maternal Grandmother   . Microcephaly Maternal Grandmother   . Heart attack Maternal Grandmother     Social History   Socioeconomic History  . Marital status: Widowed    Spouse name: Not on file  . Number of children: 2  . Years of education: Not on file  . Highest education level: Not on file  Occupational History  . Occupation: Building surveyordaycare teacher  Social Needs  . Financial resource strain: Not hard at all  . Food insecurity    Worry: Never true    Inability: Never true  . Transportation needs    Medical: No    Non-medical: No  Tobacco Use  . Smoking status: Never Smoker  . Smokeless tobacco: Never Used  . Tobacco comment: years ago may have smoked 1 cig or less a week  Substance and Sexual Activity  . Alcohol use: No    Alcohol/week: 0.0 standard drinks  .  Drug use: No  . Sexual activity: Not on file  Lifestyle  . Physical activity    Days per week: 5 days    Minutes per session: 30 min  . Stress: Very much  Relationships  . Social connections    Talks on phone: More than three times a week    Gets together: Once a week    Attends religious service: More than 4 times per year    Active member of club or organization: No    Attends meetings of clubs or organizations: Never    Relationship status: Widowed  . Intimate partner violence    Fear of current or ex partner: No    Emotionally abused: No    Physically abused: No    Forced sexual activity: No  Other Topics Concern  . Not on file  Social History Narrative  . Not on file     Physical Exam  Vital Signs and Nursing Notes reviewed Vitals:    08/31/18 1732 08/31/18 1800  BP: 134/81 (!) 156/82  Pulse: (!) 37 (!) 40  Resp: 12 16  Temp:    SpO2: 100% 100%    CONSTITUTIONAL: Well-appearing, NAD NEURO:  Alert and oriented x 3, no focal deficits EYES:  eyes equal and reactive ENT/NECK:  no LAD, no JVD CARDIO: Bradycardic rate, well-perfused, normal S1 and S2 PULM:  CTAB no wheezing or rhonchi GI/GU:  normal bowel sounds, non-distended, non-tender MSK/SPINE:  No gross deformities, no edema SKIN:  no rash, atraumatic PSYCH:  Appropriate speech and behavior  Diagnostic and Interventional Summary    EKG Interpretation  Date/Time:  Thursday August 31 2018 12:43:43 EDT Ventricular Rate:  42 PR Interval:    QRS Duration: 105 QT Interval:  504 QTC Calculation: 422 R Axis:   43 Text Interpretation:  Sinus bradycardia Confirmed by Gerlene Fee [phone]) on 08/31/2018 1:15:11 PM Also confirmed by Gerlene Fee [phone]), editor Philomena Doheny [phone])  on 08/31/2018 2:39:20 PM      Labs Reviewed  BASIC METABOLIC PANEL - Abnormal; Notable for the following components:      Result Value   Creatinine, Ser 1.13 (*)    GFR calc non Af Amer 57 (*)    All other components within normal limits  CBC - Abnormal; Notable for the following components:   WBC 3.8 (*)    All other components within normal limits  D-DIMER, QUANTITATIVE (NOT AT [facility])  I-STAT BETA HCG BLOOD, ED ([facility], [facility], [facility] ONLY)  TROPONIN I (HIGH SENSITIVITY)  TROPONIN I (HIGH SENSITIVITY)    DG Chest 2 View  Final Result      Medications  acetaminophen (TYLENOL) tablet 1,000 mg (1,000 mg Oral Given 08/31/18 1546)     Procedures Critical Care  ED Course and Medical Decision Making  I have reviewed the triage vital signs and the nursing notes.  Pertinent labs & imaging results that were available during my care of the patient were reviewed by me and considered in my medical decision making (see below for details).  Considering ACS, PE, thought to be unlikely but also  considering dissection given the radiation to the mid thoracic back.  However musculoskeletal pain seems the most likely given the exacerbation with movement.  Awaiting troponin, d-dimer.  Patient has equal pulses, mediastinum appears normal.  After laboratory evaluation will consider cardiology consultation given the continued syncopal events.  High-sensitivity troponin is 4 upon both first and second draw.  Patient has had continued sinus bradycardia without arrhythmia during  her entire ED visit.  She has been hemodynamically stable.  Spoke with EP cardiologist on-call, who agrees with close follow-up next week with Dr. Elberta Fortisamnitz to further discuss the possibility of pacemaker.  After the discussed management above, the patient was determined to be safe for discharge.  The patient was in agreement with this plan and all questions regarding their care were answered.  ED return precautions were discussed and the patient will return to the ED with any significant worsening of condition.  Elmer SowMichael M. Pilar PlateBero, MD [facility] [email or web address]  Final Clinical Impressions(s) / ED Diagnoses     ICD-10-CM   1. Other chest pain  R07.89     ED Discharge Orders         Ordered    nystatin (MYCOSTATIN/NYSTOP) powder  4 times daily     08/31/18 1758             Sabas SousBero, Elo Marmolejos M, MD 08/31/18 [phone]

## 2018-08-31 NOTE — ED Notes (Signed)
Patient transported to X-ray 

## 2018-09-01 ENCOUNTER — Telehealth: Payer: Self-pay | Admitting: *Deleted

## 2018-09-01 NOTE — Telephone Encounter (Addendum)
Called pt to arrange OV for next week per Dr. Rayann Heman:  Patient in ED with atypical chest pain and ongoing issues with syncope. Chronically bradycardic.  Previously did not want pacing. Pt scheduled for Monday w/ Camnitz. Pt agreeable to plan.      COVID-19 Pre-Screening Questions:  . In the past 7 to 10 days have you had a cough,  shortness of breath, headache, congestion, fever (100 or greater) body aches, chills, sore throat, or sudden loss of taste or sense of smell?  NO . Have you been around anyone with known Covid 19.  NO . Have you been around anyone who is awaiting Covid 19 test results in the past 7 to 10 days? NO . Have you been around anyone who has been exposed to Covid 19, or has mentioned symptoms of Covid 19 within the past 7 to 10 days?  NO  If you have any concerns/questions about symptoms patients report during screening (either on the phone or at threshold). Contact the provider seeing the patient or DOD for further guidance.  If neither are available contact a member of the leadership team.

## 2018-09-04 ENCOUNTER — Encounter: Payer: Self-pay | Admitting: *Deleted

## 2018-09-04 ENCOUNTER — Ambulatory Visit (INDEPENDENT_AMBULATORY_CARE_PROVIDER_SITE_OTHER): Payer: Medicaid Other | Admitting: Cardiology

## 2018-09-04 ENCOUNTER — Other Ambulatory Visit: Payer: Self-pay

## 2018-09-04 ENCOUNTER — Encounter: Payer: Self-pay | Admitting: Cardiology

## 2018-09-04 VITALS — BP 122/80 | HR 51 | Ht 65.0 in | Wt 267.4 lb

## 2018-09-04 DIAGNOSIS — G4733 Obstructive sleep apnea (adult) (pediatric): Secondary | ICD-10-CM | POA: Diagnosis not present

## 2018-09-04 DIAGNOSIS — I495 Sick sinus syndrome: Secondary | ICD-10-CM | POA: Diagnosis not present

## 2018-09-04 NOTE — Progress Notes (Signed)
Electrophysiology Office Note   Date:  09/04/2018   ID:  Melanie Hess, DOB November 10, 1970, MRN [id number]  PCP:  Melanie Hess  Cardiologist:  Rennis GoldenHilty Primary Electrophysiologist:  Melanie Punch Jorja LoaMartin Mellany Dinsmore, Hess    No chief complaint on file.    History of Present Illness: Melanie Hess is a 48 y.o. female who is being seen today for the evaluation of near syncope at the request of Melanie Hess. Presenting today for electrophysiology evaluation.  She has a history of hypertension, type 2 diabetes, OSA on CPAP, and morbid obesity status post gastric sleeve.  She has had multiple episodes of syncope described as lightheadedness and room spinning.  She felt hot prior to her episodes of passing out.  One episode of syncope was at [employer] while working.  She went to the emergency room and was found to be bradycardic with heart rates in the 40s without evidence of arrhythmia or heart block.  She presented the emergency room 08/31/2018 after an episode of chest pain and syncope the day before.  All work-up was negative.  She was bradycardic in the emergency room without symptoms at the time.  Today, she denies symptoms of palpitations, chest pain, shortness of breath, orthopnea, PND, lower extremity edema, claudication, dizziness, presyncope, syncope, bleeding, or neurologic sequela. The patient is tolerating medications without difficulties.  She continues to have recurrent episodes of syncope as well as severe episodes of fatigue.  She has sleep apnea which is undertreated likely as she has not seen anyone in the last few years.  She is also been bradycardic and fatigued.  Heart rates have been documented in the 40s with symptoms of weakness and fatigue.  She could fall asleep just about any time.   Past Medical History:  Diagnosis Date  . Ankle fracture, right 2001  . Anxiety   . Arthritis   . Asthma   . Carpal tunnel syndrome    Bilateral  . Diabetes mellitus without  complication (HCC)    Type II  . GERD (gastroesophageal reflux disease)   . Hernia, abdominal   . Hypertension   . Migraine   . OSA (obstructive sleep apnea)   . Plantar fasciitis of right foot   . Pneumonia    hx  . Shingles 11/2015  . Shortness of breath dyspnea    with walking short distances   Past Surgical History:  Procedure Laterality Date  . CARPAL TUNNEL RELEASE Left   . GASTRIC BYPASS    . HEEL SPUR RESECTION Right 08/01/2015   Procedure: HEEL SPUR EXCISIONS;  Surgeon: Marcene CorningPeter Dalldorf, Hess;  [facility];  Service: Orthopedics;  Laterality: Right;  . HEEL SPUR RESECTION Left 01/27/2016   Procedure: LEFT HEEL SPUR EXCISION AND TENDON ACHILLIES REPAIR;  Surgeon: Marcene CorningPeter Dalldorf, Hess;  Location: [facility] OR;  Service: Orthopedics;  Laterality: Left;  PRONE POSITION  . LAPAROSCOPIC GASTRIC SLEEVE RESECTION    . TUBAL LIGATION       Current Outpatient Medications  Medication Sig Dispense Refill  . albuterol (PROVENTIL) (2.5 MG/3ML) 0.083% nebulizer solution Take 2.5 mg by nebulization every 6 (six) hours as needed for wheezing or shortness of breath.     . Ascorbic Acid (VITAMIN C PO) Take 1 tablet by mouth daily.     Marland Kitchen. BREO ELLIPTA 200-25 MCG/INH AEPB INHALE 1 PUFF INTO LUNGS DAILY (Patient taking differently: Inhale 1 puff into the lungs daily. ) 60 each 6  . cetirizine (ZYRTEC) 10 MG tablet Take  10 mg by mouth daily as needed for allergies.     . Cyanocobalamin (VITAMIN B-12 PO) Take 1 tablet by mouth at bedtime.    . dicyclomine (BENTYL) 10 MG capsule Take 10 mg by mouth at bedtime.    . Galcanezumab-gnlm (EMGALITY) 120 MG/ML SOAJ Inject 120 mLs into the skin every 30 (thirty) days. For migraine prevention    . IRON PO Take 1 tablet by mouth at bedtime.    Marland Kitchen nystatin (MYCOSTATIN/NYSTOP) powder Apply topically 4 (four) times daily. 15 g 0  . omeprazole (PRILOSEC) 20 MG capsule Take 1 capsule (20 mg total) by mouth 2 (two) times daily before a meal. 60 capsule 0  . pregabalin  (LYRICA) 75 MG capsule Take 75 mg by mouth daily as needed (nerve pain).     Marland Kitchen PROVENTIL HFA 108 (90 Base) MCG/ACT inhaler INHALE 2 PUFFS INTO THE LUNGS EVERY 6 HOURS AS NEEDED FOR WHEEZING OR SHORTNESS OF BREATH (Patient taking differently: Inhale 2 puffs into the lungs every 6 (six) hours as needed for wheezing or shortness of breath. ) 6.7 g 0  . ursodiol (ACTIGALL) 250 MG tablet Take 250 mg by mouth at bedtime.    Marland Kitchen VICTOZA 18 MG/3ML SOPN Inject 1.8 mg into the skin daily.     No current facility-administered medications for this visit.     Allergies:   Aspirin, Nsaids, Penicillins, Tolmetin, Oxycodone-acetaminophen, Vicodin [hydrocodone-acetaminophen], Adhesive [tape], and Tramadol   Social History:  The patient  reports that she has never smoked. She has never used smokeless tobacco. She reports that she does not drink alcohol or use drugs.   Family History:  The patient's family history includes Brain cancer in her cousin; Breast cancer in her maternal aunt; CAD in her maternal grandmother; Depression in her mother; Heart attack in her maternal grandmother; Hypertension in her mother; Microcephaly in her maternal grandmother; Pulmonary embolism in her mother.    ROS:  Please see the history of present illness.   Otherwise, review of systems is positive for none.   All other systems are reviewed and negative.    PHYSICAL EXAM: VS:  BP 122/80   Pulse (!) 51   Ht 5\' 5"  (1.651 m)   Wt 267 lb 6.4 oz (121.3 kg)   SpO2 97%   BMI 44.50 kg/m  , BMI Body mass index is 44.5 kg/m. GEN: Well nourished, well developed, in no acute distress  HEENT: normal  Neck: no JVD, carotid bruits, or masses Cardiac: Bradycardic, regular; no murmurs, rubs, or gallops,no edema  Respiratory:  clear to auscultation bilaterally, normal work of breathing GI: soft, nontender, nondistended, + BS MS: no deformity or atrophy  Skin: warm and dry Neuro:  Strength and sensation are intact Psych: euthymic mood,  full affect  EKG:  EKG is not ordered today. Personal review of the ekg ordered 08/31/18 shows sinus rhythm, rate 42  Recent Labs: 02/13/2018: ALT 14; Magnesium 1.8; TSH 1.665 08/31/2018: BUN 10; Creatinine, Ser 1.13; Hemoglobin 12.2; Platelets 193; Potassium 3.8; Sodium 140    Lipid Panel  No results found for: CHOL, TRIG, HDL, CHOLHDL, VLDL, LDLCALC, LDLDIRECT   Wt Readings from Last 3 Encounters:  09/04/18 267 lb 6.4 oz (121.3 kg)  08/31/18 260 lb (117.9 kg)  02/21/18 285 lb (129.3 kg)      Other studies Reviewed: Additional studies/ records that were reviewed today include: TTE 02/14/18  Review of the above records today demonstrates:   1. The left ventricle appears to be  normal in size, has normal wall thickness 60-65% ejection fraction Spectral Doppler shows indeterminate pattern of diastolic filling.  2. Right ventricular systolic pressure is could not be assessed.  3. The right ventricle has normal size and normal systolic function.  4. Normal left atrial size.  5. Normal right atrial size.  6. The mitral valve normal in structure and function.  7. Normal tricuspid valve.  8. Aortic valve normal.  9. Pulmonic valve regurgitation is mild by color flow Doppler. 10. The inferior vena cava was dilated in size with >50% respiratory variablity. 11. No atrial level shunt detected by color flow Doppler.    ASSESSMENT AND PLAN:  1.  Recurrent syncope with fatigue: Episodes of syncope appear to be due to vagal episodes.  She has had heart rates in the 40s to 70s at times, though she had not have symptoms with these.  She has not had syncope while wearing a cardiac monitor.  Spite that, she has had significant amounts of fatigue.  She has been bradycardic.  I am worried that her bradycardia is related to her fatigue.  We Kristl Morioka plan for pacemaker implant.  Risks and benefits were discussed and include bleeding, infection, pneumothorax.  She understands his risks and is agreed to the  procedure.  2.  OSA: CPAP compliance encouraged.  Windsor Goeken ensure that she has an appointment with Dr. Tresa EndoKelly.  3.  Hypertension: Currently well controlled    Current medicines are reviewed at length with the patient today.   The patient does not have concerns regarding her medicines.  The following changes were made today:  none  Labs/ tests ordered today include:  No orders of the defined types were placed in this encounter.    Disposition:   FU with Ilijah Doucet 3 months  Signed, Laurina Fischl Jorja LoaMartin Tymber Stallings, Hess  09/04/2018 4:06 PM     [facility] [address] [place] [address] [phone] (office) [phone] (fax)

## 2018-09-04 NOTE — Patient Instructions (Signed)
Medication Instructions:  Your physician recommends that you continue on your current medications as directed. Please refer to the Current Medication list given to you today.     * If you need a refill on your cardiac medications before your next appointment, please call your pharmacy. *  Labwork: None ordered  Testing/Procedures: Your physician has recommended that you have a pacemaker inserted. A pacemaker is a small device that is placed under the skin of your chest or abdomen to help control abnormal heart rhythms. This device uses electrical pulses to prompt the heart to beat at a normal rate. Pacemakers are used to treat heart rhythms that are too slow. Wire (leads) are attached to the pacemaker that goes into the chambers of you heart. This is done in the hospital and usually requires and overnight stay. Please follow the instructions below, located under the special instructions section.   Follow-Up: Your physician recommends that you schedule a wound check appointment 10-14 days, after your procedure on 09/11/18, with the device clinic.  Your physician recommends that you schedule a follow up appointment in 91 days, after your procedure on 09/11/18, with Dr. Elberta Fortisamnitz.  * Please note that any paperwork needing to be filled out by the provider will need to be addressed at the front desk prior to seeing the provider.  Please note that any FMLA, disability or other documents regarding health condition is subject to a $25.00 charge that must be received prior to completion of paperwork in the form of a money order or check. *  Thank you for choosing [facility]!!   Dory HornSherri Anajah Sterbenz, RN [phone]   Any Other Special Instructions Will Be Listed Below (If Applicable).     Implantable Device Instructions  You are scheduled for:                  _____ Permanent Transvenous Pacemaker  on  09/11/18  with Dr. Elberta Fortisamnitz.  1.   [facility]  [facility] at  8:00 a.m. on the day of your procedure. (The address is [address])  2. Do not eat or drink after midnight the night before your procedure.  3.   Complete COVID screening on 09/08/18. [facility] to [facility] ([address], [place]) for your Covid testing.   This is a drive thru test site.  There will be multiple testing areas.  Be sure to share with the first checkpoint that you are there for pre-procedure/surgery testing. This will put you into the right (yellow) lane that leads to the PAT testing team. Stay in your car and the nurse team will come to your car to test you.  After you are tested please go home and self quarantine until the day of your procedure.    4.   A) Take 1/2 your usual dose of Victoza at bedtime the night before your procedure.        B)  Hold all of your morning medications the morning of your procedure.  5.  Plan for an overnight stay.  Bring your insurance cards and a list of you medications.  6.  Wash your chest and neck with surgical scrub the evening before and the morning of your procedure.  Rinse well. Please review the surgical scrub instruction sheet given to you.  7. Your chest will need to be shaved prior to this procedure (if needed). We ask that you do this  yourself at home 1 to 2 days before or if uncomfortable/unable to do yourself, then it will be performed by the hospital staff the day of.                                                                                                                * If you have ANY questions after you get home, please call Dory HornSherri Pallas Wahlert, RN @ [phone].  * Every attempt is made to prevent procedures from being rescheduled.  Due to the nature of  Electrophysiology, rescheduling can happen.  The physician is always aware and directs the staff when this occurs.     Pacemaker Implantation, Adult Pacemaker implantation is a procedure to place a pacemaker inside your  chest. A pacemaker is a small computer that sends electrical signals to the heart and helps your heart beat normally. A pacemaker also stores information about your heart rhythms. You may need pacemaker implantation if you:  Have a slow heartbeat (bradycardia).  Faint (syncope).  Have shortness of breath (dyspnea) due to heart problems.  The pacemaker attaches to your heart through a wire, called a lead. Sometimes just one lead is needed. Other times, there will be two leads. There are two types of pacemakers:  Transvenous pacemaker. This type is placed under the skin or muscle of your chest. The lead goes through a vein in the chest area to reach the inside of the heart.  Epicardial pacemaker. This type is placed under the skin or muscle of your chest or belly. The lead goes through your chest to the outside of the heart.  Tell a health care provider about:  Any allergies you have.  All medicines you are taking, including vitamins, herbs, eye drops, creams, and over-the-counter medicines.  Any problems you or family members have had with anesthetic medicines.  Any blood or bone disorders you have.  Any surgeries you have had.  Any medical conditions you have.  Whether you are pregnant or may be pregnant. What are the risks? Generally, this is a safe procedure. However, problems may occur, including:  Infection.  Bleeding.  Failure of the pacemaker or the lead.  Collapse of a lung or bleeding into a lung.  Blood clot inside a blood vessel with a lead.  Damage to the heart.  Infection inside the heart (endocarditis).  Allergic reactions to medicines.  What happens before the procedure? Staying hydrated Follow instructions from your health care provider about hydration, which may include:  Up to 2 hours before the procedure - you may continue to drink clear liquids, such as water, clear fruit juice, black coffee, and plain tea.  Eating and drinking restrictions  Follow instructions from your health care provider about eating and drinking, which may include:  8 hours before the procedure - stop eating heavy meals or foods such as meat, fried foods, or fatty foods.  6 hours before the procedure - stop eating light meals or foods, such as toast or cereal.  6 hours before the  procedure - stop drinking milk or drinks that contain milk.  2 hours before the procedure - stop drinking clear liquids.  Medicines  Ask your health care provider about: ? Changing or stopping your regular medicines. This is especially important if you are taking diabetes medicines or blood thinners. ? Taking medicines such as aspirin and ibuprofen. These medicines can thin your blood. Do not take these medicines before your procedure if your health care provider instructs you not to.  You may be given antibiotic medicine to help prevent infection. General instructions  You will have a heart evaluation. This may include an electrocardiogram (ECG), chest X-ray, and heart imaging (echocardiogram,  or echo) tests.  You will have blood tests.  Do not use any products that contain nicotine or tobacco, such as cigarettes and e-cigarettes. If you need help quitting, ask your health care provider.  Plan to have someone take you home from the hospital or clinic.  If you will be going home right after the procedure, plan to have someone with you for 24 hours.  Ask your health care provider how your surgical site will be marked or identified. What happens during the procedure?  To reduce your risk of infection: ? Your health care team will wash or sanitize their hands. ? Your skin will be washed with soap. ? Hair may be removed from the surgical area.  An IV tube will be inserted into one of your veins.  You will be given one or more of the following: ? A medicine to help you relax (sedative). ? A medicine to numb the area (local anesthetic). ? A medicine to make you fall  asleep (general anesthetic).  If you are getting a transvenous pacemaker: ? An incision will be made in your upper chest. ? A pocket will be made for the pacemaker. It may be placed under the skin or between layers of muscle. ? The lead will be inserted into a blood vessel that returns to the heart. ? While X-rays are taken by an imaging machine (fluoroscopy), the lead will be advanced through the vein to the inside of your heart. ? The other end of the lead will be tunneled under the skin and attached to the pacemaker.  If you are getting an epicardial pacemaker: ? An incision will be made near your ribs or breastbone (sternum) for the lead. ? The lead will be attached to the outside of your heart. ? Another incision will be made in your chest or upper belly to create a pocket for the pacemaker. ? The free end of the lead will be tunneled under the skin and attached to the pacemaker.  The transvenous or epicardial pacemaker will be tested. Imaging studies may be done to check the lead position.  The incisions will be closed with stitches (sutures), adhesive strips, or skin glue.  Bandages (dressing) will be placed over the incisions. The procedure may vary among health care providers and hospitals. What happens after the procedure?  Your blood pressure, heart rate, breathing rate, and blood oxygen level will be monitored until the medicines you were given have worn off.  You will be given antibiotics and pain medicine.  ECG and chest x-rays will be done.  You will wear a continuous type of ECG (Holter monitor) to check your heart rhythm.  Your health care provider will program the pacemaker.  Do not drive for 24 hours if you received a sedative. This information is not intended to replace advice given  to you by your health care provider. Make sure you discuss any questions you have with your health care provider. Document Released: 12/25/2001 Document Revised: 07/25/2015 Document  Reviewed: 06/18/2015 Elsevier Interactive Patient Education  2018 [employer]    Supplemental Discharge Instructions for  Pacemaker/Defibrillator Patients  ACTIVITY No heavy lifting or vigorous activity with your left/right arm for 6 to 8 weeks.  Do not raise your left/right arm above your head for one week.  Gradually raise your affected arm as drawn below.           __  NO DRIVING for     ; you may begin driving on     .  WOUND CARE - Keep the wound area clean and dry.  Do not get this area wet for one week. No showers for one week; you may shower on     . - The tape/steri-strips on your wound will fall off; do not pull them off.  No bandage is needed on the site.  DO  NOT apply any creams, oils, or ointments to the wound area. - If you notice any drainage or discharge from the wound, any swelling or bruising at the site, or you develop a fever > 101? F after you are discharged home, call the office at once.  SPECIAL INSTRUCTIONS - You are still able to use cellular telephones; use the ear opposite the side where you have your pacemaker/defibrillator.  Avoid carrying your cellular phone near your device. - When traveling through airports, show security personnel your identification card to avoid being screened in the metal detectors.  Ask the security personnel to use the hand wand. - Avoid arc welding equipment, MRI testing (magnetic resonance imaging), TENS units (transcutaneous nerve stimulators).  Call the office for questions about other devices. - Avoid electrical appliances that are in poor condition or are not properly grounded. - Microwave ovens are safe to be near or to operate.  ADDITIONAL INFORMATION FOR DEFIBRILLATOR PATIENTS SHOULD YOUR DEVICE GO OFF: - If your device goes off ONCE and you feel fine afterward, notify the device clinic nurses. - If your device goes off ONCE and you do not feel well afterward, call 911. - If your device goes off TWICE, call 911. - If  your device goes off THREE TIMES IN ONE DAY, call 911.  DO NOT DRIVE YOURSELF OR A FAMILY MEMBER WITH A DEFIBRILLATOR TO THE HOSPITAL-CALL 911.

## 2018-09-04 NOTE — Addendum Note (Signed)
Addended by: Stanton Kidney on: 09/04/2018 05:32 PM   Modules accepted: Orders

## 2018-09-08 ENCOUNTER — Other Ambulatory Visit (HOSPITAL_COMMUNITY)
Admission: RE | Admit: 2018-09-08 | Discharge: 2018-09-08 | Disposition: A | Discharge status: Home / Self Care | Payer: Medicaid Other | Source: Ambulatory Visit | Attending: Cardiology | Admitting: Cardiology

## 2018-09-08 DIAGNOSIS — Z01812 Encounter for preprocedural laboratory examination: Secondary | ICD-10-CM | POA: Insufficient documentation

## 2018-09-08 DIAGNOSIS — Z20828 Contact with and (suspected) exposure to other viral communicable diseases: Secondary | ICD-10-CM | POA: Diagnosis not present

## 2018-09-08 LAB — SARS CORONAVIRUS 2 (TAT 6-24 HRS): SARS Coronavirus 2: NEGATIVE

## 2018-09-11 ENCOUNTER — Encounter: Payer: Self-pay | Admitting: Nurse Practitioner

## 2018-09-11 ENCOUNTER — Ambulatory Visit (HOSPITAL_COMMUNITY): Payer: Medicaid Other

## 2018-09-11 ENCOUNTER — Ambulatory Visit (HOSPITAL_COMMUNITY)
Admission: RE | Admit: 2018-09-11 | Discharge: 2018-09-11 | Disposition: A | Discharge status: Home / Self Care | Payer: Medicaid Other | Attending: Cardiology | Admitting: Cardiology

## 2018-09-11 ENCOUNTER — Encounter (HOSPITAL_COMMUNITY)
Admission: RE | Disposition: A | Discharge status: Home / Self Care | Payer: Medicaid Other | Source: Home / Self Care | Attending: Cardiology

## 2018-09-11 ENCOUNTER — Other Ambulatory Visit: Payer: Self-pay

## 2018-09-11 DIAGNOSIS — I495 Sick sinus syndrome: Secondary | ICD-10-CM | POA: Insufficient documentation

## 2018-09-11 DIAGNOSIS — Z8619 Personal history of other infectious and parasitic diseases: Secondary | ICD-10-CM | POA: Diagnosis not present

## 2018-09-11 DIAGNOSIS — G4733 Obstructive sleep apnea (adult) (pediatric): Secondary | ICD-10-CM | POA: Diagnosis not present

## 2018-09-11 DIAGNOSIS — G5603 Carpal tunnel syndrome, bilateral upper limbs: Secondary | ICD-10-CM | POA: Insufficient documentation

## 2018-09-11 DIAGNOSIS — Z79899 Other long term (current) drug therapy: Secondary | ICD-10-CM | POA: Insufficient documentation

## 2018-09-11 DIAGNOSIS — I10 Essential (primary) hypertension: Secondary | ICD-10-CM | POA: Diagnosis not present

## 2018-09-11 DIAGNOSIS — Z886 Allergy status to analgesic agent status: Secondary | ICD-10-CM | POA: Diagnosis not present

## 2018-09-11 DIAGNOSIS — E119 Type 2 diabetes mellitus without complications: Secondary | ICD-10-CM | POA: Insufficient documentation

## 2018-09-11 DIAGNOSIS — J45909 Unspecified asthma, uncomplicated: Secondary | ICD-10-CM | POA: Insufficient documentation

## 2018-09-11 DIAGNOSIS — K219 Gastro-esophageal reflux disease without esophagitis: Secondary | ICD-10-CM | POA: Insufficient documentation

## 2018-09-11 DIAGNOSIS — M1990 Unspecified osteoarthritis, unspecified site: Secondary | ICD-10-CM | POA: Diagnosis not present

## 2018-09-11 DIAGNOSIS — Z9884 Bariatric surgery status: Secondary | ICD-10-CM | POA: Diagnosis not present

## 2018-09-11 DIAGNOSIS — Z8249 Family history of ischemic heart disease and other diseases of the circulatory system: Secondary | ICD-10-CM | POA: Diagnosis not present

## 2018-09-11 DIAGNOSIS — Z888 Allergy status to other drugs, medicaments and biological substances status: Secondary | ICD-10-CM | POA: Diagnosis not present

## 2018-09-11 DIAGNOSIS — Z880 Allergy status to penicillin: Secondary | ICD-10-CM | POA: Insufficient documentation

## 2018-09-11 DIAGNOSIS — Z885 Allergy status to narcotic agent status: Secondary | ICD-10-CM | POA: Diagnosis not present

## 2018-09-11 DIAGNOSIS — Z9851 Tubal ligation status: Secondary | ICD-10-CM | POA: Insufficient documentation

## 2018-09-11 DIAGNOSIS — Z95818 Presence of other cardiac implants and grafts: Secondary | ICD-10-CM

## 2018-09-11 HISTORY — PX: PACEMAKER IMPLANT: EP1218

## 2018-09-11 LAB — GLUCOSE, CAPILLARY: Glucose-Capillary: 90 mg/dL (ref 70–99)

## 2018-09-11 LAB — SURGICAL PCR SCREEN
MRSA, PCR: NEGATIVE
Staphylococcus aureus: NEGATIVE

## 2018-09-11 SURGERY — PACEMAKER IMPLANT

## 2018-09-11 MED ORDER — SODIUM CHLORIDE 0.9 % IV SOLN
INTRAVENOUS | Status: AC
  Filled 2018-09-11: qty 2

## 2018-09-11 MED ORDER — MUPIROCIN 2 % EX OINT
TOPICAL_OINTMENT | CUTANEOUS | Status: AC
  Administered 2018-09-11: 08:00:00
  Filled 2018-09-11: qty 22

## 2018-09-11 MED ORDER — LIDOCAINE HCL (PF) 1 % IJ SOLN
INTRAMUSCULAR | Status: AC
  Filled 2018-09-11: qty 30

## 2018-09-11 MED ORDER — IOHEXOL 350 MG/ML SOLN
INTRAVENOUS | Status: DC | PRN
  Administered 2018-09-11: 11:00:00 20 mL via INTRAVENOUS

## 2018-09-11 MED ORDER — LIDOCAINE HCL (PF) 1 % IJ SOLN
INTRAMUSCULAR | Status: DC | PRN
  Administered 2018-09-11 (×2): 25 mL
  Administered 2018-09-11: 55 mL

## 2018-09-11 MED ORDER — VANCOMYCIN HCL 10 G IV SOLR
1500.0000 mg | INTRAVENOUS | Status: AC
  Administered 2018-09-11: 1500 mg via INTRAVENOUS
  Filled 2018-09-11: qty 1500

## 2018-09-11 MED ORDER — LABETALOL HCL 5 MG/ML IV SOLN
10.0000 mg | Freq: Once | INTRAVENOUS | Status: AC
  Administered 2018-09-11: 10 mg via INTRAVENOUS

## 2018-09-11 MED ORDER — MIDAZOLAM HCL 5 MG/5ML IJ SOLN
INTRAMUSCULAR | Status: DC | PRN
  Administered 2018-09-11 (×4): 1 mg via INTRAVENOUS

## 2018-09-11 MED ORDER — ACETAMINOPHEN 325 MG PO TABS
ORAL_TABLET | ORAL | Status: AC
  Administered 2018-09-11: 650 mg via ORAL
  Filled 2018-09-11: qty 2

## 2018-09-11 MED ORDER — FENTANYL CITRATE (PF) 100 MCG/2ML IJ SOLN
INTRAMUSCULAR | Status: DC | PRN
  Administered 2018-09-11 (×4): 25 ug via INTRAVENOUS

## 2018-09-11 MED ORDER — CHLORHEXIDINE GLUCONATE 4 % EX LIQD: 60.0000 mL | Freq: Once | CUTANEOUS | Status: DC

## 2018-09-11 MED ORDER — LIDOCAINE HCL (PF) 1 % IJ SOLN
INTRAMUSCULAR | Status: AC
  Filled 2018-09-11: qty 60

## 2018-09-11 MED ORDER — SODIUM CHLORIDE 0.9 % IV SOLN
INTRAVENOUS | Status: DC
  Administered 2018-09-11: 08:00:00 via INTRAVENOUS

## 2018-09-11 MED ORDER — HEPARIN (PORCINE) IN NACL 1000-0.9 UT/500ML-% IV SOLN
INTRAVENOUS | Status: DC | PRN
  Administered 2018-09-11: 500 mL

## 2018-09-11 MED ORDER — LABETALOL HCL 5 MG/ML IV SOLN
INTRAVENOUS | Status: AC
  Administered 2018-09-11: 10 mg via INTRAVENOUS
  Filled 2018-09-11: qty 4

## 2018-09-11 MED ORDER — HEPARIN (PORCINE) IN NACL 1000-0.9 UT/500ML-% IV SOLN
INTRAVENOUS | Status: AC
  Filled 2018-09-11: qty 500

## 2018-09-11 MED ORDER — SODIUM CHLORIDE 0.9 % IV SOLN
80.0000 mg | INTRAVENOUS | Status: AC
  Administered 2018-09-11: 11:00:00 80 mg
  Filled 2018-09-11: qty 2

## 2018-09-11 MED ORDER — DIPHENHYDRAMINE HCL 50 MG/ML IJ SOLN
25.0000 mg | Freq: Once | INTRAMUSCULAR | Status: AC
  Administered 2018-09-11: 25 mg via INTRAVENOUS

## 2018-09-11 MED ORDER — ACETAMINOPHEN 325 MG PO TABS
325.0000 mg | ORAL_TABLET | ORAL | Status: DC | PRN
  Administered 2018-09-11: 650 mg via ORAL

## 2018-09-11 MED ORDER — FENTANYL CITRATE (PF) 100 MCG/2ML IJ SOLN
INTRAMUSCULAR | Status: AC
  Filled 2018-09-11: qty 2

## 2018-09-11 MED ORDER — DIPHENHYDRAMINE HCL 50 MG/ML IJ SOLN
INTRAMUSCULAR | Status: AC
  Administered 2018-09-11: 25 mg via INTRAVENOUS
  Filled 2018-09-11: qty 1

## 2018-09-11 MED ORDER — ONDANSETRON HCL 4 MG/2ML IJ SOLN: 4.0000 mg | Freq: Four times a day (QID) | INTRAMUSCULAR | Status: DC | PRN

## 2018-09-11 MED ORDER — MIDAZOLAM HCL 5 MG/5ML IJ SOLN
INTRAMUSCULAR | Status: AC
  Filled 2018-09-11: qty 5

## 2018-09-11 MED ORDER — VANCOMYCIN HCL IN DEXTROSE 1-5 GM/200ML-% IV SOLN: 1000.0000 mg | Freq: Two times a day (BID) | INTRAVENOUS | Status: DC

## 2018-09-11 SURGICAL SUPPLY — 10 items
CABLE SURGICAL S-101-97-12 (CABLE) ×3 IMPLANT
GUIDEWIRE ANGLED .035X150CM (WIRE) ×2 IMPLANT
HOVERMATT SINGLE USE (MISCELLANEOUS) ×2 IMPLANT
LEAD TENDRIL MRI 52CM LPA1200M (Lead) ×2 IMPLANT
LEAD TENDRIL MRI 58CM LPA1200M (Lead) ×2 IMPLANT
PACEMAKER ASSURITY DR-RF (Pacemaker) ×2 IMPLANT
PAD PRO RADIOLUCENT 2001M-C (PAD) ×3 IMPLANT
SHEATH 8FR PRELUDE SNAP 13 (SHEATH) ×4 IMPLANT
TRAY PACEMAKER INSERTION (PACKS) ×3 IMPLANT
WIRE HI TORQ VERSACORE-J 145CM (WIRE) ×2 IMPLANT

## 2018-09-11 NOTE — Progress Notes (Signed)
Patient returned from Xray and tech informed RN that patient felt lightheaded and faint.  RN took BP and 138/88, Hr 64, 02 at 110.  RN informed Dr. Curt Bears and patient given option to stay overnight or go home.  Patient would like to go home.  RN instructed patient to return to hospital and visit ED if symptoms continue

## 2018-09-11 NOTE — Progress Notes (Signed)
Patient with c/o of soreness and discomfort at surgical site.  Given tylenol earlier with little relief.  Patient has allergies to most pain medication.  Patient ok with not receiving pain medication other than tylenol

## 2018-09-11 NOTE — H&P (Signed)
Melanie Hess has presented today for surgery, with the diagnosis of sick sinus syndrome.  The various methods of treatment have been discussed with the patient and family. After consideration of risks, benefits and other options for treatment, the patient has consented to  Procedure(s): Pacemaker implant as a surgical intervention .  Risks include but not limited to bleeding, tamponade, infection, pneumothorax, among others. The patient's history has been reviewed, patient examined, no change in status, stable for surgery.  I have reviewed the patient's chart and labs.  Questions were answered to the patient's satisfaction.    Cristan Scherzer Curt Bears, MD 09/11/2018 7:44 AM

## 2018-09-11 NOTE — Progress Notes (Signed)
Patient states she feels relief of itching from benadryl.  Arm still red, hives have resolved

## 2018-09-11 NOTE — Progress Notes (Addendum)
Upon arrival to room c/o itching to back, right arm and hand, right buttocks . Redness noted to right forearm. (Iv Vanco completed) call placed to [place], [place] .Hives noted to right arm, red rash like area note to right side of chest. New orders noted.

## 2018-09-11 NOTE — Discharge Instructions (Signed)
Supplemental Discharge Instructions for  Pacemaker/Defibrillator Patients  Activity No heavy lifting or vigorous activity with your left/right arm for 6 to 8 weeks.  Do not raise your left/right arm above your head for one week.  Gradually raise your affected arm as drawn below.           __      09/15/18                         09/16/18                     09/17/18                  09/18/18  NO DRIVING for   1 week  ; you may begin driving on  1/61/098/31/20   .  WOUND CARE - Keep the wound area clean and dry.  Do not get this area wet for one week. No showers for one week; you may shower on   09/18/18  . - The tape/steri-strips on your wound will fall off; do not pull them off.  No bandage is needed on the site.  DO  NOT apply any creams, oils, or ointments to the wound area. - If you notice any drainage or discharge from the wound, any swelling or bruising at the site, or you develop a fever > 101? F after you are discharged home, call the office at once.  Special Instructions - You are still able to use cellular telephones; use the ear opposite the side where you have your pacemaker/defibrillator.  Avoid carrying your cellular phone near your device. - When traveling through airports, show security personnel your identification card to avoid being screened in the metal detectors.  Ask the security personnel to use the hand wand. - Avoid arc welding equipment, MRI testing (magnetic resonance imaging), TENS units (transcutaneous nerve stimulators).  Call the office for questions about other devices. - Avoid electrical appliances that are in poor condition or are not properly grounded. - Microwave ovens are safe to be near or to operate.  Pacemaker Implantation, Adult, Care After This sheet gives you information about how to care for yourself after your procedure. Your health care provider may also give you more specific instructions. If you have problems or questions, contact your health care  provider. What can I expect after the procedure? After the procedure, it is common to have:  Mild pain.  Slight bruising.  Some swelling over the incision.  A slight bump over the skin where the device was placed. Sometimes, it is possible to feel the device under the skin. This is normal. Follow these instructions at home: Medicines  Take over-the-counter and prescription medicines only as told by your health care provider.  If you were prescribed an antibiotic medicine, take it as told by your health care provider. Do not stop taking the antibiotic even if you start to feel better. Wound care   Do not remove the bandage on your chest until directed to do so by your health care provider.  After your bandage is removed, you may see pieces of tape called skin adhesive strips over the area where the cut was made (incision site). Let them fall off on their own.  Check the incision site every day to make sure it is not infected, bleeding, or starting to pull apart.  Do not use lotions or ointments near the incision site unless directed  to do so.  Keep the incision area clean and dry for 2-3 days after the procedure or as directed by your health care provider. It takes several weeks for the incision site to completely heal.  Do not take baths, swim, or use a hot tub for 7-10 days or as otherwise directed by your health care provider. Activity  Do not drive or use heavy machinery while taking prescription pain medicine.  Do not drive for 24 hours if you were given a medicine to help you relax (sedative).  Check with your health care provider before you start to drive or play sports.  Avoid sudden jerking, pulling, or chopping movements that pull your upper arm far away from your body. Avoid these movements for at least 6 weeks or as long as told by your health care provider.  Do not lift your upper arm above your shoulders for at least 6 weeks or as long as told by your health care  provider. This means no tennis, golf, or swimming.  You may go back to work when your health care provider says it is okay. Pacemaker care  You may be shown how to transfer data from your pacemaker through the phone to your health care provider.  Always let all health care providers know about your pacemaker before you have any medical procedures or tests.  Wear a medical ID bracelet or necklace stating that you have a pacemaker. Carry a pacemaker ID card with you at all times.  Your pacemaker battery will last for 5-15 years. Routine checks by your health care provider will let the health care provider know when the battery is starting to run down. The pacemaker will need to be replaced when the battery starts to run down.  Do not use amateur Chief of Staff. Other electrical devices are safe to use, including power tools, lawn mowers, and speakers. If you are unsure of whether something is safe to use, ask your health care provider.  When using your cell phone, hold it to the ear opposite the pacemaker. Do not leave your cell phone in a pocket over the pacemaker.  Avoid places or objects that have a strong electric or magnetic field, including: ? Airport Herbalist. When at the airport, let officials know that you have a pacemaker. ? Power plants. ? Large electrical generators. ? Radiofrequency transmission towers, such as cell phone and radio towers. General instructions  Weigh yourself every day. If you suddenly gain weight, fluid may be building up in your body.  Keep all follow-up visits as told by your health care provider. This is important. Contact a health care provider if:  You gain weight suddenly.  Your legs or feet swell.  It feels like your heart is fluttering or skipping beats (heart palpitations).  You have chills or a fever.  You have more redness, swelling, or pain around your incisions.  You have more fluid or blood coming  from your incisions.  Your incisions feel warm to the touch.  You have pus or a bad smell coming from your incisions. Get help right away if:  You have chest pain.  You have trouble breathing or are short of breath.  You become extremely tired.  You are light-headed or you faint. This information is not intended to replace advice given to you by your health care provider. Make sure you discuss any questions you have with your health care provider. Document Released: 07/24/2004 Document Revised: 12/17/2016 Document Reviewed:  10/17/2015 Elsevier Patient Education  2020 [employer]

## 2018-09-12 ENCOUNTER — Encounter (HOSPITAL_COMMUNITY): Payer: Self-pay | Admitting: Cardiology

## 2018-09-13 ENCOUNTER — Other Ambulatory Visit: Payer: Self-pay

## 2018-09-13 ENCOUNTER — Ambulatory Visit (INDEPENDENT_AMBULATORY_CARE_PROVIDER_SITE_OTHER): Payer: Medicaid Other | Admitting: Cardiovascular Disease

## 2018-09-13 ENCOUNTER — Encounter: Payer: Self-pay | Admitting: Cardiovascular Disease

## 2018-09-13 VITALS — BP 124/83 | HR 74 | Ht 65.0 in | Wt 270.2 lb

## 2018-09-13 DIAGNOSIS — Z9889 Other specified postprocedural states: Secondary | ICD-10-CM | POA: Diagnosis not present

## 2018-09-13 DIAGNOSIS — I10 Essential (primary) hypertension: Secondary | ICD-10-CM

## 2018-09-13 DIAGNOSIS — E119 Type 2 diabetes mellitus without complications: Secondary | ICD-10-CM | POA: Diagnosis not present

## 2018-09-13 DIAGNOSIS — I495 Sick sinus syndrome: Secondary | ICD-10-CM

## 2018-09-13 DIAGNOSIS — G4733 Obstructive sleep apnea (adult) (pediatric): Secondary | ICD-10-CM | POA: Diagnosis not present

## 2018-09-13 NOTE — Patient Instructions (Addendum)
Medication Instructions:  The current medical regimen is effective;  continue present plan and medications.  We will try to see if we can set her up with a DME company and she can reinstitute treatment if she still has her machine.  Otherwise she will require a new sleep study and new set up for therapy.  If you need a refill on your cardiac medications before your next appointment, please call your pharmacy.   Follow-Up: At [facility], you and your health needs are our priority.  As part of our continuing mission to provide you with exceptional heart care, we have created designated Provider Care Teams.  These Care Teams include your primary Cardiologist (physician) and Advanced Practice Providers (APPs -  Physician Assistants and Nurse Practitioners) who all work together to provide you with the care you need, when you need it. You will need a follow up appointment in 6-8 weeks. You may see Dr.Kelly (sleep) or one of the following Advanced Practice Providers on your designated Care Team: Almyra Deforest, [place] . Fabian Sharp, PA-C

## 2018-09-14 ENCOUNTER — Telehealth: Payer: Self-pay | Admitting: Cardiovascular Disease

## 2018-09-14 NOTE — Telephone Encounter (Signed)
Mariann Laster was not aware patient needed a new mask.  I spoke with Mariann Laster she will get ordered and call patient tomorrow. Will route so she has message.

## 2018-09-14 NOTE — Telephone Encounter (Signed)
° ° °  1) What problem are you experiencing? Need new mask  2) Who is your medical equipment company? [employer]   Please route to the sleep study assistant.

## 2018-09-14 NOTE — Telephone Encounter (Signed)
Follow Up   Patient calling in again about getting a new CPAP mask. Please give patient a call back.

## 2018-09-14 NOTE — Telephone Encounter (Signed)
Follow-up:  Patient calling again about getting a new mask for her CPAP. She was hoping to reach our office before we close. She wants to make sure everything is in order before the weekend

## 2018-09-18 ENCOUNTER — Telehealth: Payer: Self-pay | Admitting: Cardiology

## 2018-09-18 NOTE — Telephone Encounter (Signed)
° ° °  Patient calling to report rash on pacemaker incision site and arm pit pain  Requesting letter for employer with restrictions on amount of weight that can be lifted

## 2018-09-18 NOTE — Telephone Encounter (Signed)
Spoke with patient. Advised that per Dr. Curt Bears, she should not return to work until after her wound check. Letter can be provided by DC RN at that visit with typical post-implant restrictions. Pt verbalizes understanding.  She was unable to login to her MyChart account. Able to reset password on clinic end at pt's request. Pt agrees to try again tonight. Provided direct DC phone number if she is unable to send a photo via MyChart by tomorrow. Pt denies additional questions or concerns at this time.

## 2018-09-18 NOTE — Telephone Encounter (Signed)
Spoke with patient. Reviewed weight restrictions and post-op movement restrictions. 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 Advised I will discuss with Dr. Curt Bears and write a letter for her to pickup (tomorrow at the earliest). Pt is in agreement with plan.  Pt reports "rash" is present where Tegaderm dressing had been. Denies drainage or blisters. Steri-strips in place. Pt reports site is tender to palpation, discomfort extends to her left underarm area. She hasn't noticed any swelling. She has not tried acetaminophen, but took some Lyrica that she had at home. Explained that Lyrica is not the ideal choice for this type of pain, encouraged her to try acetaminophen. Pt verbalizes understanding.  Recommended that pt send in a picture of site via MyChart. Pt verbalizes understanding. Will call back with recommendations at that time.

## 2018-09-19 ENCOUNTER — Encounter: Payer: Self-pay | Admitting: Cardiovascular Disease

## 2018-09-19 NOTE — Progress Notes (Addendum)
Cardiology Office Note    Date:  09/19/2018   ID:  Melanie Hess, DOB Apr 10, 1970, MRN [id number]  PCP:  Lin Landsman, MD  Cardiologist:  Shelva Majestic, MD (sleep); Dr. Debara Pickett  Follow-up sleep evaluation  History of Present Illness:  Melanie Hess is a 48 y.o. female who presented for initial sleep clinic evaluation folllowing initiation of CPAP therapy in December 2017.  I have not seen her since.  She is now referred by Dr. Curt Bears for reevaluation since she has not been using CPAP therapy..  Melanie Hess is a 48 year old female who has a history of super morbid obesity, a history of asthma, Type 2 DM,  and poorly  controlled hypertension. He had a history of significant snoring, frequent awakenings, daytime sleepiness, and nonrestorative sleep. She was referred for a sleep study which was done on 08/28/201.  This was done in a split-night protocol and revealed moderate sleep apnea overall with an AHI of 15.8 per hour; however, there was very severe sleep apnea during REM sleep with an AHI of 66/h. She had severe oxygen desaturation to a nadir of 66%.  CPAP titration performed and a 19 cm pressure was recommended for CPAP initiation.  Typically, she goes to bed at 8 pm and  wakes up at 5 AM.  She the walks her dog, eats breakfast, and then due to fatigue and residual sleepiness goes to back to bed for 2 more hours. A download from October 26 through 12/28/2015 revealed that she was not meeting compliance with only 54% of usage days and only 35% of days with use greater than 4 hrs. Her AHI was excellent when she is using her CPAP at  1.4/hr.  She falls to sleep without putting her CPAP and then when she wake up later shen then  puts it on.  Her sleep remains poor.  If she goes to the bathroom.  She often does not put upon returning from the bathroom.   A download over the past month, has showed reduced use at  only 3 hours and 6 minutes average per night with an AHI of 1.0.  She continues  to be sleepy.  An Epworth Sleepiness kidneys to reveal hypersomnolence as shown below.   Epworth Sleepiness Scale: Situation   Chance of Dozing/Sleeping (0 = never , 1 = slight chance , 2 = moderate chance , 3 = high chance )   sitting and reading 2   watching TV 3   sitting inactive in a public place 2   being a passenger in a motor vehicle for an hour or more 3   lying down in the afternoon 3   sitting and talking to someone 2   sitting quietly after lunch (no alcohol) 3   while stopped for a few minutes in traffic as the driver 1   Total Score  19   During her December 2017 evaluation I had significant discussion with her regarding the impact of untreated sleep apnea on her cardiovascular health and particularly its effect on resistant hypertension, PAF, potential nocturnal myocardial ischemia as well as TIA/CVA.  Apparently, she states that she initially used the CPAP but believes she has not used therapy in over 2 years.  Presently, she is very sleepy and can sleep all the time.  She feels poorly.  She snores.  She typically goes to bed between 6 and 7 PM and wakes up at 4 AM so that she is at work at  5:45 AM.  A new Epworth Sleepiness Scale score was calculated in the office today and this endorsed at 24 feeling severe excessive daytime sleepiness.    Recently she has developed episodes of significant bradycardia and had multiple episodes of pre-syncope described as lightheadedness and room spinning and an episode of syncope while at [employer] working.  She was seen by Dr. Curt Bears and underwent insertion of a permanent pacemaker 2 days ago on September 11, 2018.  Past Medical History:  Diagnosis Date   Ankle fracture, right 2001   Anxiety    Arthritis    Asthma    Carpal tunnel syndrome    Bilateral   Diabetes mellitus without complication (HCC)    Type II   GERD (gastroesophageal reflux disease)    Hernia, abdominal    Hypertension    Migraine    OSA (obstructive sleep  apnea)    Plantar fasciitis of right foot    Pneumonia    hx   Shingles 11/2015   Shortness of breath dyspnea    with walking short distances    Past Surgical History:  Procedure Laterality Date   CARPAL TUNNEL RELEASE Left    GASTRIC BYPASS     HEEL SPUR RESECTION Right 08/01/2015   Procedure: HEEL SPUR EXCISIONS;  Surgeon: Melrose Nakayama, MD;  Location: [facility];  Service: Orthopedics;  Laterality: Right;   HEEL SPUR RESECTION Left 01/27/2016   Procedure: LEFT HEEL SPUR EXCISION AND TENDON ACHILLIES REPAIR;  Surgeon: Melrose Nakayama, MD;  Location: [facility];  Service: Orthopedics;  Laterality: Left;  PRONE POSITION   LAPAROSCOPIC GASTRIC SLEEVE RESECTION     PACEMAKER IMPLANT N/A 09/11/2018   Procedure: PACEMAKER IMPLANT;  Surgeon: Constance Haw, MD;  Location: [facility];  Service: Cardiovascular;  Laterality: N/A;   TUBAL LIGATION      Current Medications: Outpatient Medications Prior to Visit  Medication Sig Dispense Refill   albuterol (PROVENTIL) (2.5 MG/3ML) 0.083% nebulizer solution Take 2.5 mg by nebulization every 6 (six) hours as needed for wheezing or shortness of breath.      Ascorbic Acid (VITAMIN C PO) Take 1 tablet by mouth daily.      BREO ELLIPTA 200-25 MCG/INH AEPB INHALE 1 PUFF INTO LUNGS DAILY (Patient taking differently: Inhale 1 puff into the lungs daily. ) 60 each 6   cetirizine (ZYRTEC) 10 MG tablet Take 10 mg by mouth daily as needed for allergies.      Cyanocobalamin (VITAMIN B-12 PO) Take 1 tablet by mouth at bedtime.     dicyclomine (BENTYL) 10 MG capsule Take 10 mg by mouth at bedtime.     Galcanezumab-gnlm (EMGALITY) 120 MG/ML SOAJ Inject 120 mLs into the skin every 30 (thirty) days. For migraine prevention     IRON PO Take 1 tablet by mouth at bedtime.     nystatin (MYCOSTATIN/NYSTOP) powder Apply topically 4 (four) times daily. 15 g 0   omeprazole (PRILOSEC) 20 MG capsule Take 1 capsule (20 mg total) by mouth 2 (two) times  daily before a meal. 60 capsule 0   pregabalin (LYRICA) 75 MG capsule Take 75 mg by mouth daily as needed (nerve pain).      PROVENTIL HFA 108 (90 Base) MCG/ACT inhaler INHALE 2 PUFFS INTO THE LUNGS EVERY 6 HOURS AS NEEDED FOR WHEEZING OR SHORTNESS OF BREATH (Patient taking differently: Inhale 2 puffs into the lungs every 6 (six) hours as needed for wheezing or shortness of breath. ) 6.7 g 0  ursodiol (ACTIGALL) 250 MG tablet Take 250 mg by mouth at bedtime.     VICTOZA 18 MG/3ML SOPN Inject 1.8 mg into the skin daily.     No facility-administered medications prior to visit.      Allergies:   Aspirin, Nsaids, Penicillins, Tolmetin, Vancomycin, Oxycodone-acetaminophen, Vicodin [hydrocodone-acetaminophen], Adhesive [tape], and Tramadol   Social History   Socioeconomic History   Marital status: Widowed    Spouse name: Not on file   Number of children: 2   Years of education: Not on file   Highest education level: Not on file  Occupational History   Occupation: Chemical engineer  Social Needs   Financial resource strain: Not hard at all   Food insecurity    Worry: Never true    Inability: Never true   Transportation needs    Medical: No    Non-medical: No  Tobacco Use   Smoking status: Never Smoker   Smokeless tobacco: Never Used   Tobacco comment: years ago may have smoked 1 cig or less a week  Substance and Sexual Activity   Alcohol use: No    Alcohol/week: 0.0 standard drinks   Drug use: No   Sexual activity: Not on file  Lifestyle   Physical activity    Days per week: 5 days    Minutes per session: 30 min   Stress: Very much  Relationships   Social connections    Talks on phone: More than three times a week    Gets together: Once a week    Attends religious service: More than 4 times per year    Active member of club or organization: No    Attends meetings of clubs or organizations: Never    Relationship status: Widowed  Other Topics Concern    Not on file  Social History Narrative   Not on file     Additional social history is notable in that she  is widowed.  She has children ages 61 and 32.  She is raising a  old niece.   Family History:  The patient's family history includes Brain cancer in her cousin; Breast cancer in her maternal aunt; CAD in her maternal grandmother; Depression in her mother; Heart attack in her maternal grandmother; Hypertension in her mother; Microcephaly in her maternal grandmother; Pulmonary embolism in her mother.   ROS General: Negative; No fevers, chills, or night sweats;  HEENT: Negative; No changes in vision or hearing, sinus congestion, difficulty swallowing Pulmonary: positive for asthma Cardiovascular: Negative; No chest pain, presyncope, syncope, palpitations GI: Negative; No nausea, vomiting, diarrhea, or abdominal pain GU: Negative; No dysuria, hematuria, or difficulty voiding Musculoskeletal: Negative; no myalgias, joint pain, or weakness Hematologic/Oncology: Negative; no easy bruising, bleeding Endocrine: positive for diabetes Neuro: Negative; no changes in balance, headaches Skin: Negative; No rashes or skin lesions Psychiatric: Negative; No behavioral problems, depression Sleep: positive for snoring, significant daytime sleepiness, hypersomnolence; no bruxism, restless legs, hypnogognic hallucinations, no cataplexy Other comprehensive 14 point system review is negative.   PHYSICAL EXAM:   VS:  BP 124/83    Pulse 74    Ht 5' 5" (1.651 m)    Wt 270 lb 3.2 oz (122.6 kg)    SpO2 98%    BMI 44.96 kg/m     Repeat blood pressure by me was 120/82  Wt Readings from Last 3 Encounters:  09/13/18 270 lb 3.2 oz (122.6 kg)  09/11/18 260 lb (117.9 kg)  09/04/18 267 lb 6.4 oz (121.3 kg)  General: Alert, oriented, no distress.  Morbidly obese Skin: normal turgor, no rashes, warm and dry HEENT: Normocephalic, atraumatic. Pupils equal round and reactive to light; sclera anicteric;  extraocular muscles intact;  Nose without nasal septal hypertrophy Mouth/Parynx benign; Mallinpatti scale 4 Neck: No JVD, no carotid bruits; normal carotid upstroke Lungs: clear to ausculatation and percussion; no wheezing or rales Chest wall: without tenderness to palpitation Heart: PMI not displaced, RRR, s1 s2 normal, 1/6 systolic murmur, no diastolic murmur, no rubs, gallops, thrills, or heaves Abdomen: soft, nontender; no hepatosplenomehaly, BS+; abdominal aorta nontender and not dilated by palpation. Back: no CVA tenderness Pulses 2+ Musculoskeletal: full range of motion, normal strength, no joint deformities Extremities: no clubbing cyanosis or edema, Homan's sign negative  Neurologic: grossly nonfocal; Cranial nerves grossly wnl Psychologic: Normal mood and affect   Studies/Labs Reviewed:   EKG:  EKG is not ordered today.  I reviewed her ECG from August 31, 2018 which showed marked sinus bradycardia at 42 bpm.   I have independently reviewed her prior ECG from 11/14/15  shows normal sinus rhythm at 70 bpm without significant ST segment.  No ectopy.  Recent Labs: BMP Latest Ref Rng & Units 08/31/2018 02/13/2018 02/13/2018  Glucose 70 - 99 mg/dL 74 129(H) -  BUN 6 - 20 mg/dL 10 10 -  Creatinine 0.44 - 1.00 mg/dL 1.13(H) 1.08(H) 0.99  Sodium 135 - 145 mmol/L 140 140 -  Potassium 3.5 - 5.1 mmol/L 3.8 3.1(L) -  Chloride 98 - 111 mmol/L 106 109 -  CO2 22 - 32 mmol/L 25 24 -  Calcium 8.9 - 10.3 mg/dL 9.1 8.5(L) -     Hepatic Function Latest Ref Rng & Units 02/13/2018 08/21/2017 06/24/2017  Total Protein 6.5 - 8.1 g/dL 6.6 7.2 6.6  Albumin 3.5 - 5.0 g/dL 3.6 4.0 3.5  AST 15 - 41 U/L _0 ALT 0 - 44 U/L _1 Alk Phosphatase 38 - 126 U/L 57 73 67  Total Bilirubin 0.3 - 1.2 mg/dL 0.6 0.8 0.6    CBC Latest Ref Rng & Units 08/31/2018 02/13/2018 02/13/2018  WBC 4.0 - 10.5 K/uL 3.8(L) 4.4 4.8  Hemoglobin 12.0 - 15.0 g/dL 12.2 11.3(L) 11.6(L)  Hematocrit 36.0 - 46.0 % 37.1  34.1(L) 36.6  Platelets 150 - 400 K/uL 193 192 202   Lab Results  Component Value Date   MCV 86.3 08/31/2018   MCV 86.1 02/13/2018   MCV 87.4 02/13/2018   Lab Results  Component Value Date   TSH 1.665 02/13/2018   Lab Results  Component Value Date   HGBA1C 5.5 02/13/2018     BNP    Component Value Date/Time   BNP 15.4 04/07/2016 0745    ProBNP    Component Value Date/Time   PROBNP 36.0 12/31/2013 0935     Lipid Panel  No results found for: CHOL, TRIG, HDL, CHOLHDL, VLDL, LDLCALC, LDLDIRECT   RADIOLOGY: Dg Chest 2 View  Result Date: 09/11/2018 CLINICAL DATA:  Pacemaker placement. EXAM: CHEST - 2 VIEW COMPARISON:  Chest x-ray dated August 31, 2018. FINDINGS: New left chest wall pacemaker with leads terminating in the right atrium and right ventricle. The heart size and mediastinal contours are within normal limits. Normal pulmonary vascularity. No focal consolidation, pleural effusion, or pneumothorax. No acute osseous abnormality. IMPRESSION: 1. New left chest wall pacemaker. No active cardiopulmonary disease. Electronically Signed   By: Titus Dubin M.D.   On: 09/11/2018 17:25   Dg Chest 2 View  Result Date: 08/31/2018 CLINICAL DATA:  Chest pain. Weakness. Dizziness. EXAM: CHEST - 2 VIEW COMPARISON:  Radiograph 02/13/2018, CT 01/10/2018 FINDINGS: The cardiomediastinal contours are normal. The lungs are clear. Pulmonary vasculature is normal. No consolidation, pleural effusion, or pneumothorax. No acute osseous abnormalities are seen. IMPRESSION: Unremarkable radiographs of the chest. Electronically Signed   By: Keith Rake M.D.   On: 08/31/2018 13:31     Additional studies/ records that were reviewed today include:  I reviewed her office records, ECG, and sleep study as well a of her CPAP use since set up.    ASSESSMENT:    1. OSA (obstructive sleep apnea)   2. Essential hypertension   3. Controlled type 2 diabetes mellitus without complication, without  long-term current use of insulin ([place])   4. S/P gastric surgery      PLAN:  Melanie Hess is a 48 year old female who I had seen in 2017 after she was diagnosed with sleep apnea.  Her AHI was moderate overall but sleep apnea was very severe with an AHI of 66.7 during REM sleep.  During her split-night evaluation she was titrated up to 19 cm water pressure.  Apparently, she had only use CPAP for approximately 1 year and essentially for the last 2 years has been off therapy.  When I had seen her in 2017 I had a extensive discussion with her regarding the effects of untreated sleep apnea on her cardiovascular health.  Recently she has had issues with significant bradycardia and nocturnal bradycardia arrhythmias may also be contributed by sleep apnea.  In the past I discussed the effects on potential resistant hypertension, atrial arrhythmias, nocturnal myocardial ischemia, TIA and CVA.  I also discussed that the [facility] prominence of REM sleep occurs in the second half of night and the importance of utilizing CPAP for the nights entirety.  I also discussed effects on insulin resistance, GERD, and the contribution of obesity to her sleep apnea.  Apparently she has lost a significant amount of weight over the past 3 years and when I had initially seen her BMI was greater than 62.  Her BMI today is 44.96 her having undergone bariatric surgery.  She is diabetic on Victoza.  After much discussion, she has agreed that she needs to pursue reassessment and initiation of treatment.  We will try to see if we can set her up with a DME company and that her CPAP can be set up with an auto mode however if she no longer has workable unit will need to reinitiate a sleep study and new set up date for reevaluation and treatment.  I will see her in several months for follow-up evaluation.   Medication Adjustments/Labs and Tests Ordered: Current medicines are reviewed at length with the patient today.  Concerns regarding  medicines are outlined above.  Medication changes, Labs and Tests ordered today are listed in the Patient Instructions below. Patient Instructions  Medication Instructions:  The current medical regimen is effective;  continue present plan and medications.  We will try to see if we can set her up with a DME company and she can reinstitute treatment if she still has her machine.  Otherwise she will require a new sleep study and new set up for therapy.  If you need a refill on your cardiac medications before your next appointment, please call your pharmacy.   Follow-Up: At [facility], you and your health needs are our priority.  As part of our continuing mission to  provide you with exceptional heart care, we have created designated Provider Care Teams.  These Care Teams include your primary Cardiologist (physician) and Advanced Practice Providers (APPs -  Physician Assistants and Nurse Practitioners) who all work together to provide you with the care you need, when you need it. You will need a follow up appointment in 6-8 weeks. You may see Dr.Kelly (sleep) or one of the following Advanced Practice Providers on your designated Care Team: Almyra Deforest, PA-C  Fabian Sharp, [place]     Time spent: 40 minutes   Signed, Shelva Majestic, MD  09/19/2018 [facility] [address], [place], [place], [place]  [address] Phone: [phone]

## 2018-09-19 NOTE — Addendum Note (Signed)
Addended by: Shelva Majestic A on: 09/19/2018 08:00 PM   Modules accepted: Level of Service

## 2018-09-21 NOTE — Telephone Encounter (Signed)
Follow up   Patient reuesting letter be faxed to employer [employer] Fax to [phone]

## 2018-09-27 NOTE — Telephone Encounter (Signed)
As per previous phone note from 8/31, letter will be sent after wound check appointment on 09/28/18.

## 2018-09-28 ENCOUNTER — Other Ambulatory Visit: Payer: Self-pay

## 2018-09-28 ENCOUNTER — Ambulatory Visit (INDEPENDENT_AMBULATORY_CARE_PROVIDER_SITE_OTHER): Payer: Medicaid Other | Admitting: *Deleted

## 2018-09-28 ENCOUNTER — Encounter: Payer: Self-pay | Admitting: *Deleted

## 2018-09-28 DIAGNOSIS — I495 Sick sinus syndrome: Secondary | ICD-10-CM

## 2018-09-28 DIAGNOSIS — Z950 Presence of cardiac pacemaker: Secondary | ICD-10-CM

## 2018-09-28 LAB — CUP PACEART INCLINIC DEVICE CHECK
Battery Remaining Longevity: 99 mo
Battery Voltage: 3.04 V
Brady Statistic RA Percent Paced: 51 %
Brady Statistic RV Percent Paced: 1.8 %
Date Time Interrogation Session: 20200910131357
Implantable Lead Implant Date: 20200824
Implantable Lead Implant Date: 20200824
Implantable Lead Location: 753859
Implantable Lead Location: 753860
Implantable Pulse Generator Implant Date: 20200824
Lead Channel Impedance Value: 425 Ohm
Lead Channel Impedance Value: 487.5 Ohm
Lead Channel Pacing Threshold Amplitude: 0.5 V
Lead Channel Pacing Threshold Amplitude: 0.75 V
Lead Channel Pacing Threshold Pulse Width: 0.5 ms
Lead Channel Pacing Threshold Pulse Width: 0.5 ms
Lead Channel Sensing Intrinsic Amplitude: 4.5 mV
Lead Channel Sensing Intrinsic Amplitude: 5 mV
Lead Channel Setting Pacing Amplitude: 3.5 V
Lead Channel Setting Pacing Amplitude: 3.5 V
Lead Channel Setting Pacing Pulse Width: 0.5 ms
Lead Channel Setting Sensing Sensitivity: 2 mV
Pulse Gen Model: 2272
Pulse Gen Serial Number: 9150511

## 2018-09-28 NOTE — Progress Notes (Signed)
Wound check appointment. Steri-strips removed. Wound without redness or edema. Incision edges approximated, wound well healed. Normal device function. RA threshold, sensing, and impedance consistent with implant measurements. RV impedance and threshold stable, bipolar R-waves measure 4.65mV today, unipolar R-waves measure 7.34mV, Vp 1.8%. Discussed with Dr. Gwinda Maine changes today, maintained bipolar RV sensing with sensitivity programmed at 2.43mV, continue to monitor trends. Device programmed at 3.5V for extra safety margin until 3 month visit. Histogram distribution appropriate for patient and level of activity. 46 mode switches (<1%)--no available EGMs, episodes 8-64sec duration per AMS log. No high ventricular rates noted. Patient educated about wound care, arm mobility, lifting restrictions, and Merlin monitor. ROV with Dr. Curt Bears on 12/19/18.  Return to work letter given to patient at her request.

## 2018-09-29 ENCOUNTER — Telehealth: Payer: Self-pay | Admitting: *Deleted

## 2018-09-29 NOTE — Telephone Encounter (Signed)
Attempted to reach patient to answer questions posed at DC appointment on 09/28/18. Phone not accepting calls at this time. Message sent via [place].

## 2018-10-03 ENCOUNTER — Telehealth: Payer: Self-pay | Admitting: Cardiovascular Disease

## 2018-10-03 NOTE — Telephone Encounter (Signed)
Called patient, advised her to get the paperwork needed and we would have it filled out. Patient verbalized understanding.

## 2018-10-03 NOTE — Telephone Encounter (Signed)
Patient called stating she needs a renewal for her disability placard.

## 2018-10-07 ENCOUNTER — Emergency Department (HOSPITAL_COMMUNITY): Payer: Medicaid Other

## 2018-10-07 ENCOUNTER — Emergency Department (HOSPITAL_COMMUNITY)
Admission: EM | Admit: 2018-10-07 | Discharge: 2018-10-07 | Disposition: A | Discharge status: Home / Self Care | Payer: Medicaid Other | Attending: Emergency Medicine | Admitting: Emergency Medicine

## 2018-10-07 ENCOUNTER — Other Ambulatory Visit: Payer: Self-pay

## 2018-10-07 DIAGNOSIS — J45909 Unspecified asthma, uncomplicated: Secondary | ICD-10-CM | POA: Insufficient documentation

## 2018-10-07 DIAGNOSIS — Z950 Presence of cardiac pacemaker: Secondary | ICD-10-CM | POA: Insufficient documentation

## 2018-10-07 DIAGNOSIS — R079 Chest pain, unspecified: Secondary | ICD-10-CM

## 2018-10-07 DIAGNOSIS — E119 Type 2 diabetes mellitus without complications: Secondary | ICD-10-CM | POA: Diagnosis not present

## 2018-10-07 DIAGNOSIS — Z7984 Long term (current) use of oral hypoglycemic drugs: Secondary | ICD-10-CM | POA: Insufficient documentation

## 2018-10-07 DIAGNOSIS — I10 Essential (primary) hypertension: Secondary | ICD-10-CM | POA: Diagnosis not present

## 2018-10-07 DIAGNOSIS — Z79899 Other long term (current) drug therapy: Secondary | ICD-10-CM | POA: Diagnosis not present

## 2018-10-07 LAB — TROPONIN I (HIGH SENSITIVITY)
Troponin I (High Sensitivity): 7 ng/L (ref <18)
Troponin I (High Sensitivity): 8 ng/L (ref <18)

## 2018-10-07 LAB — CBC
HCT: 35.7 % — ABNORMAL LOW (ref 36.0–46.0)
Hemoglobin: 11.9 g/dL — ABNORMAL LOW (ref 12.0–15.0)
MCH: 29.1 pg (ref 26.0–34.0)
MCHC: 33.3 g/dL (ref 30.0–36.0)
MCV: 87.3 fL (ref 80.0–100.0)
Platelets: 187 10*3/uL (ref 150–400)
RBC: 4.09 MIL/uL (ref 3.87–5.11)
RDW: 15.3 % (ref 11.5–15.5)
WBC: 4.4 10*3/uL (ref 4.0–10.5)
nRBC: 0 % (ref 0.0–0.2)

## 2018-10-07 LAB — BASIC METABOLIC PANEL
Anion gap: 9 (ref 5–15)
BUN: 13 mg/dL (ref 6–20)
CO2: 24 mmol/L (ref 22–32)
Calcium: 8.9 mg/dL (ref 8.9–10.3)
Chloride: 106 mmol/L (ref 98–111)
Creatinine, Ser: 1.05 mg/dL — ABNORMAL HIGH (ref 0.44–1.00)
GFR calc Af Amer: >60 mL/min (ref >60)
GFR calc non Af Amer: >60 mL/min (ref >60)
Glucose, Bld: 80 mg/dL (ref 70–99)
Potassium: 3.9 mmol/L (ref 3.5–5.1)
Sodium: 139 mmol/L (ref 135–145)

## 2018-10-07 LAB — I-STAT BETA HCG BLOOD, ED (MC, WL, AP ONLY): I-stat hCG, quantitative: <5 m[IU]/mL (ref <5)

## 2018-10-07 LAB — D-DIMER, QUANTITATIVE: D-Dimer, Quant: 1.35 ug/mL-FEU — ABNORMAL HIGH (ref 0.00–0.50)

## 2018-10-07 MED ORDER — ACETAMINOPHEN 500 MG PO TABS: 1000.0000 mg | ORAL_TABLET | Freq: Four times a day (QID) | ORAL | Status: DC | PRN

## 2018-10-07 MED ORDER — IOHEXOL 350 MG/ML SOLN
100.0000 mL | Freq: Once | INTRAVENOUS | Status: AC | PRN
  Administered 2018-10-07: 100 mL via INTRAVENOUS

## 2018-10-07 NOTE — ED Notes (Signed)
Patient transported to CT 

## 2018-10-07 NOTE — ED Triage Notes (Signed)
Pt endorses sudden onset of lightheadedness, left sided chest pain while working. Recently had pacemaker placed and this was her first day back to work. [employer]. VSS.

## 2018-10-07 NOTE — ED Notes (Signed)
Patient verbalizes understanding of discharge instructions. Opportunity for questioning and answers were provided. pt discharged from ED. Ambulatory by self  

## 2018-10-07 NOTE — ED Provider Notes (Signed)
[facility] Provider Note   CSN: [id number] Arrival date & time: 10/07/18  1231     History   Chief Complaint Chief Complaint  Patient presents with   Chest Pain    HPI Melanie Hess is a 48 y.o. female with a past medical history of DM, sick sinus syndrome s/p pacemaker, asthma, hypertension, obesity who presents emergency department with several days of lightheadedness, shortness of breath, and chest pain.  Patient reports she had a pacemaker placed approximately a month ago.  Patient reports she returned to work today and was standing for a while when she started feeling lightheaded, short of breath, and having left-sided chest pain that radiated to the back.  Patient reports left side of her chest around the cardiac pacemaker is tender to palpation.  Patient presented to the emergency department for further evaluation.  HPI: A 48 year old patient with a history of treated diabetes, hypertension and obesity presents for evaluation of chest pain. Initial onset of pain was approximately 3-6 hours ago. The patient's chest pain is sharp and is not worse with exertion. The patient complains of nausea. The patient's chest pain is middle- or left-sided, is not well-localized, is not described as heaviness/pressure/tightness and does radiate to the arms/jaw/neck. The patient denies diaphoresis. The patient has no history of stroke, has no history of peripheral artery disease, has not smoked in the past 90 days, has no relevant family history of coronary artery disease (first degree relative at less than age 50) and has no history of hypercholesterolemia.   The history is provided by the patient.    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   Morbid obesity with BMI of 50.0-59.9, adult (HCC) 08/09/2017   Esophageal reflux 03/10/2017   Preoperative clearance 08/12/2016   Chest pain 04/07/2016   LVH (left ventricular hypertrophy) 04/07/2016   Hepatic steatosis 04/07/2016   Diabetes mellitus with complication (HCC)    Moderate persistent asthma 12/09/2015   OSA on CPAP 10/23/2015   Pain of right heel 08/01/2015   Chronic pain    Anemia 03/24/2014   Diabetes mellitus type 2, controlled, without complications (HCC) 03/24/2014   Asthma exacerbation 03/24/2014   Hypertension 02/28/2014   Morbid obesity (HCC) 02/28/2014   DOE (dyspnea on exertion) 02/28/2014   Dyspnea and respiratory abnormality 02/14/2014    Past Surgical History:  Procedure Laterality Date   CARPAL TUNNEL RELEASE Left    GASTRIC BYPASS     HEEL SPUR RESECTION Right 08/01/2015   Procedure: HEEL SPUR EXCISIONS;  Surgeon: Marcene CorningPeter Dalldorf, MD;  [facility];  Service: Orthopedics;  Laterality: Right;   HEEL SPUR RESECTION Left 01/27/2016   Procedure: LEFT HEEL SPUR EXCISION AND TENDON ACHILLIES  REPAIR;  Surgeon: Melrose Nakayama, MD;  Location: [facility];  Service: Orthopedics;  Laterality: Left;  PRONE POSITION   LAPAROSCOPIC GASTRIC SLEEVE RESECTION     PACEMAKER IMPLANT N/A 09/11/2018   Procedure: PACEMAKER IMPLANT;  Surgeon: Constance Haw, MD;  Location: [place] [facility];  Service: Cardiovascular;  Laterality: N/A;   TUBAL LIGATION       OB History   No obstetric history on file.      Home Medications     Prior to Admission medications   Medication Sig Start Date End Date Taking? Authorizing Provider  albuterol (PROVENTIL) (2.5 MG/3ML) 0.083% nebulizer solution Take 2.5 mg by nebulization every 6 (six) hours as needed for wheezing or shortness of breath.     [provider]  Ascorbic Acid (VITAMIN C PO) Take 1 tablet by mouth daily.     [provider]  BREO ELLIPTA 200-25 MCG/INH AEPB INHALE 1 PUFF INTO LUNGS DAILY Patient taking differently: Inhale 1 puff into the lungs daily.  03/30/16   Brand Males, MD  cetirizine (ZYRTEC) 10 MG tablet Take 10 mg by mouth daily as needed for allergies.     [provider]  Cyanocobalamin (VITAMIN B-12 PO) Take 1 tablet by mouth at bedtime.    [provider]  dicyclomine (BENTYL) 10 MG capsule Take 10 mg by mouth at bedtime.    [provider]  Galcanezumab-gnlm (EMGALITY) 120 MG/ML SOAJ Inject 120 mLs into the skin every 30 (thirty) days. For migraine prevention    [provider]  IRON PO Take 1 tablet by mouth at bedtime.    [provider]  nystatin (MYCOSTATIN/NYSTOP) powder Apply topically 4 (four) times daily. 08/31/18   Maudie Flakes, MD  omeprazole (PRILOSEC) 20 MG capsule Take 1 capsule (20 mg total) by mouth 2 (two) times daily before a meal. 02/14/18   Norval Morton, MD  pregabalin (LYRICA) 75 MG capsule Take 75 mg by mouth daily as needed (nerve pain).     [provider]  PROVENTIL HFA 108 (90 Base) MCG/ACT inhaler INHALE 2 PUFFS INTO THE LUNGS EVERY 6 HOURS AS NEEDED FOR WHEEZING OR SHORTNESS OF BREATH Patient taking differently: Inhale 2 puffs into the lungs every 6 (six) hours as needed for wheezing or shortness of breath.  03/17/17   Magdalen Spatz, NP  ursodiol (ACTIGALL) 250 MG tablet Take 250 mg by mouth at bedtime. 01/08/18   [provider]  VICTOZA 18 MG/3ML SOPN Inject 1.8 mg into the skin daily. 05/22/18   [provider]    Family  History Family History  Problem Relation Age of Onset   Hypertension Mother    Pulmonary embolism Mother    Depression Mother    Breast cancer Maternal Aunt    Brain cancer Cousin    CAD Maternal Grandmother    Microcephaly Maternal Grandmother    Heart attack Maternal Grandmother     Social History Social History   Tobacco Use   Smoking status: Never Smoker   Smokeless tobacco: Never Used   Tobacco comment: years ago may have smoked 1 cig or less a week  Substance Use Topics   Alcohol use: No    Alcohol/week: 0.0 standard drinks   Drug use: No     Allergies   Adhesive [tape], Aspirin, Nsaids, Penicillins, Tolmetin, Vancomycin, Oxycodone-acetaminophen, Vicodin [hydrocodone-acetaminophen], and Tramadol   Review of Systems Review of Systems  Constitutional: Positive for fatigue.  HENT: Negative for congestion and  trouble swallowing.   Eyes: Negative for visual disturbance.  Respiratory: Positive for shortness of breath. Negative for cough.   Gastrointestinal: Positive for nausea. Negative for abdominal pain, constipation, diarrhea and vomiting.  Genitourinary: Negative for dysuria.  Musculoskeletal: Positive for back pain. Negative for gait problem.  Neurological: Positive for light-headedness. Negative for headaches.  Psychiatric/Behavioral: Negative for confusion.     Physical Exam Updated Vital Signs BP 133/85 (BP Location: Left Arm)    Pulse (!) 59    Temp 98 F (36.7 C) (Oral)    Resp 15    SpO2 99%   Physical Exam Constitutional:      General: She is not in acute distress.    Appearance: She is obese. She is not diaphoretic.  HENT:     Head: Normocephalic and atraumatic.     Right Ear: External ear normal.     Left Ear: External ear normal.     Nose: Nose normal.     Mouth/Throat:     Mouth: Mucous membranes are moist.     Pharynx: Oropharynx is clear.  Eyes:     Conjunctiva/sclera: Conjunctivae normal.  Neck:     Musculoskeletal: Neck  supple.  Cardiovascular:     Rate and Rhythm: Normal rate and regular rhythm.     Pulses: Normal pulses.  Pulmonary:     Effort: Pulmonary effort is normal. No respiratory distress.     Breath sounds: No wheezing or rhonchi.     Comments: Cardiac device in left chest, overlying healing surgical scar without signs of erythema or drainage Chest:     Chest wall: Tenderness (L sided, around cardiac device) present.  Abdominal:     Palpations: Abdomen is soft.     Tenderness: There is no abdominal tenderness. There is no guarding or rebound.  Musculoskeletal:        General: No tenderness.     Right lower leg: No edema.     Left lower leg: No edema.  Skin:    General: Skin is warm and dry.  Neurological:     General: No focal deficit present.     Mental Status: She is alert and oriented to person, place, and time.     Sensory: No sensory deficit.     Motor: No weakness.      ED Treatments / Results  Labs (all labs ordered are listed, but only abnormal results are displayed) Labs Reviewed  BASIC METABOLIC PANEL - Abnormal; Notable for the following components:      Result Value   Creatinine, Ser 1.05 (*)    All other components within normal limits  CBC - Abnormal; Notable for the following components:   Hemoglobin 11.9 (*)    HCT 35.7 (*)    All other components within normal limits  D-DIMER, QUANTITATIVE (NOT AT [facility]) - Abnormal; Notable for the following components:   D-Dimer, Quant 1.35 (*)    All other components within normal limits  I-STAT BETA HCG BLOOD, ED ([facility], [facility], [facility] ONLY)  TROPONIN I (HIGH SENSITIVITY)  TROPONIN I (HIGH SENSITIVITY)    EKG EKG Interpretation  Date/Time:  Saturday October 07 2018 12:34:39 EDT Ventricular Rate:  60 PR Interval:  114 QRS Duration: 86 QT Interval:  432 QTC Calculation: 432 R Axis:   24 Text Interpretation:  Electronic atrial pacemaker Confirmed by Blane Ohara [phone]) on 10/07/2018 3:22:12 PM   Radiology Dg Chest 2  View  Result Date: 10/07/2018 CLINICAL DATA:  Chest pain EXAM: CHEST -  2 VIEW COMPARISON:  Chest x-ray dated 09/11/2018. FINDINGS: Heart size and mediastinal contours are within normal limits. Lungs are clear. No pleural effusion or pneumothorax is seen. No acute or suspicious osseous finding. LEFT chest wall pacemaker/ICD apparatus appears stable in position. IMPRESSION: No active cardiopulmonary disease. No evidence of pneumonia or pulmonary edema. Electronically Signed   By: Bary RichardStan  Maynard M.D.   On: 10/07/2018 13:10   Ct Angio Chest Pe W And/or Wo Contrast  Result Date: 10/07/2018 CLINICAL DATA:  Lightheadedness. Elevated D-dimer. Left-sided chest pain while working. EXAM: CT ANGIOGRAPHY CHEST WITH CONTRAST TECHNIQUE: Multidetector CT imaging of the chest was performed using the standard protocol during bolus administration of intravenous contrast. Multiplanar CT image reconstructions and MIPs were obtained to evaluate the vascular anatomy. CONTRAST:  100mL OMNIPAQUE IOHEXOL 350 MG/ML SOLN COMPARISON:  January 10, 2018. FINDINGS: Cardiovascular: Contrast injection is sufficient to demonstrate satisfactory opacification of the pulmonary arteries to the segmental level. There is no pulmonary embolus. The main pulmonary artery is within normal limits for size. There is no CT evidence of acute right heart strain. The visualized aorta is normal. Heart size is normal, without pericardial effusion. There is a dual chamber left-sided pacemaker in place. There is likely narrowing of the left subclavian vein secondary to the pacer leads. Mediastinum/Nodes: --No mediastinal or hilar lymphadenopathy. --No axillary lymphadenopathy. --No supraclavicular lymphadenopathy. --Normal thyroid gland. --there are postsurgical changes at the GE junction. There is a small to moderate-sized hiatal hernia. The patient appears to be status post prior gastric bypass. Lungs/Pleura: No pulmonary nodules or masses. No pleural effusion  or pneumothorax. No focal airspace consolidation. No focal pleural abnormality. Upper Abdomen: No acute abnormality. Musculoskeletal: No chest wall abnormality. No acute or significant osseous findings. Review of the MIP images confirms the above findings. IMPRESSION: No evidence of pulmonary embolism or other acute intrathoracic process. Electronically Signed   By: Zykira Matlack Mantlehristopher  Green M.D.   On: 10/07/2018 21:03    Procedures Procedures (including critical care time)  Medications Ordered in ED Medications  acetaminophen (TYLENOL) tablet 1,000 mg (has no administration in time range)  iohexol (OMNIPAQUE) 350 MG/ML injection 100 mL (100 mLs Intravenous Contrast Given 10/07/18 2048)     Initial Impression / Assessment and Plan / ED Course  I have reviewed the triage vital signs and the nursing notes.  Pertinent labs & imaging results that were available during my care of the patient were reviewed by me and considered in my medical decision making (see chart for details).    Concern for possible pacemaker malfunction versus PE versus hypoglycemic episode. HEAR Score: 5.  Wells score low risk with recent surgery as risk factor.  D-dimer elevated.  CTA chest obtained which showed no radiographic evidence of pulmonary embolism.  Patient's pacemaker was interrogated and is functioning properly without any events recorded.  Patient given Tylenol for pain and p.o. fluid.  Delta troponins negative.  Patient's pain may also be due to some inflammation around her cardiac device as normal healing postsurgically.  Discussed findings with patient and options of admission for observation versus discharge home with close follow-up.  Patient reports she has PCP follow-up already scheduled for Monday and is comfortable with discharge home.  Advised patient to try Tylenol for pain and drink plenty of fluids.  Patient advised to call her cardiology office as well for close follow-up.  Discussed with patient that her  episode may be due to hypoglycemia as when patient presented her glucose level was 80 and  she reported eating some crackers prior to arrival.  All questions answered and strict return precautions given.  Patient comfortable with plan to discharge home with close outpatient follow-up.  Patient seen and plan discussed with Dr. Jodi Mourning.  Final Clinical Impressions(s) / ED Diagnoses   Final diagnoses:  Chest pain, unspecified type    ED Discharge Orders    None       Ignacia Palma, MD 10/08/18 Georgiann Mohs    Blane Ohara, MD 10/08/18 [phone]

## 2018-10-07 NOTE — ED Notes (Signed)
Patient states she didn't feel on Wed was feeling lightheaded and dizzy also c/o sob with chest pain that radiated to her back. Went home from work , went back to work today at [employer] am states she started feeling the same way. 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

## 2018-10-09 ENCOUNTER — Ambulatory Visit: Payer: Medicaid Other | Admitting: Cardiology

## 2018-10-20 NOTE — Telephone Encounter (Signed)
Order was signed and faxed to [facility] on 10/03/18.

## 2018-10-26 ENCOUNTER — Ambulatory Visit (INDEPENDENT_AMBULATORY_CARE_PROVIDER_SITE_OTHER): Payer: Medicaid Other | Admitting: Cardiovascular Disease

## 2018-10-26 ENCOUNTER — Other Ambulatory Visit: Payer: Self-pay

## 2018-10-26 VITALS — BP 146/96 | HR 64 | Ht 65.0 in | Wt 273.0 lb

## 2018-10-26 DIAGNOSIS — I10 Essential (primary) hypertension: Secondary | ICD-10-CM

## 2018-10-26 DIAGNOSIS — E119 Type 2 diabetes mellitus without complications: Secondary | ICD-10-CM | POA: Diagnosis not present

## 2018-10-26 DIAGNOSIS — Z950 Presence of cardiac pacemaker: Secondary | ICD-10-CM

## 2018-10-26 DIAGNOSIS — G4733 Obstructive sleep apnea (adult) (pediatric): Secondary | ICD-10-CM | POA: Diagnosis not present

## 2018-10-26 NOTE — Progress Notes (Signed)
Cardiology Office Note    Date:  10/28/2018   ID:  GENEVEIVE FURNESS, DOB 10/18/1970, MRN [id number]  PCP:  Lin Landsman, MD  Cardiologist:  Shelva Majestic, MD (sleep); Dr. Debara Pickett  Follow-up sleep evaluation  History of Present Illness:  Melanie Hess is a 48 y.o. female who presents for 6-week follow-up sleep evaluation.  Melanie Hess is a 48 year old female who has a history of super morbid obesity, a history of asthma, Type 2 DM,  and poorly  controlled hypertension. He had a history of significant snoring, frequent awakenings, daytime sleepiness, and nonrestorative sleep. She was referred for a sleep study which was done on 08/28/201.  This was done in a split-night protocol and revealed moderate sleep apnea overall with an AHI of 15.8 per hour; however, there was very severe sleep apnea during REM sleep with an AHI of 66/h. She had severe oxygen desaturation to a nadir of 66%.  CPAP titration performed and a 19 cm pressure was recommended for CPAP initiation.  Typically, she goes to bed at 8 pm and  wakes up at 5 AM.  She the walks her dog, eats breakfast, and then due to fatigue and residual sleepiness goes to back to bed for 2 more hours. A download from October 26 through 12/28/2015 revealed that she was not meeting compliance with only 54% of usage days and only 35% of days with use greater than 4 hrs. Her AHI was excellent when she is using her CPAP at  1.4/hr.  She falls to sleep without putting her CPAP and then when she wake up later shen then  puts it on.  Her sleep remains poor.  If she goes to the bathroom.  She often does not put upon returning from the bathroom.   A download over the past month, has showed reduced use at  only 3 hours and 6 minutes average per night with an AHI of 1.0.  She continues to be sleepy.  An Epworth Sleepiness kidneys to reveal hypersomnolence as shown below.   Epworth Sleepiness Scale: Situation   Chance of Dozing/Sleeping (0 = never , 1 =  slight chance , 2 = moderate chance , 3 = high chance )   sitting and reading 2   watching TV 3   sitting inactive in a public place 2   being a passenger in a motor vehicle for an hour or more 3   lying down in the afternoon 3   sitting and talking to someone 2   sitting quietly after lunch (no alcohol) 3   while stopped for a few minutes in traffic as the driver 1   Total Score  19   During her December 2017 evaluation I had significant discussion with her regarding the impact of untreated sleep apnea on her cardiovascular health and particularly its effect on resistant hypertension, PAF, potential nocturnal myocardial ischemia as well as TIA/CVA.  Apparently, she states that she initially used the CPAP but believes she has not used therapy in over 2 years.  Presently, she is very sleepy and can sleep all the time.  She feels poorly.  She snores.  She typically goes to bed between 6 and 7 PM and wakes up at 4 AM so that she is at work at 5:45 AM.  A new Epworth Sleepiness Scale score was calculated in the office today and this endorsed at 24 feeling severe excessive daytime sleepiness.    Recently she has developed episodes of significant  bradycardia and had multiple episodes of pre-syncope described as lightheadedness and room spinning and an episode of syncope while at [employer] working.  She was seen by Dr. Curt Bears and underwent insertion of a permanent pacemaker 2 days ago on September 11, 2018.  She was referred back to me for follow-up sleep evaluation on September 13, 2018.  At that time, I again discussed the cardiovascular ramifications of severe sleep apnea if left untreated.  After much discussion she agreed that she wanted to pursue reassessment and initiation of her prior CPAP therapy.  Since she had a previous machine, we changed her mode to an auto mode with a range of 4 to 19 cm of water.  A download was obtained in the office today from September 8 through October 25, 2018.  She is not  meeting compliance.  Usage was only 4 out of 30 days with average usage at 2 hours and 36 minutes.  Typically she goes to bed between 6 and 7 PM oftentimes wakes up at midnight, again at 2 AM, and has to wake up at 5 AM to go to work.  When using treatment, AHI was excellent at 2.0.  She presents for reevaluation.  Past Medical History:  Diagnosis Date   Ankle fracture, right 2001   Anxiety    Arthritis    Asthma    Carpal tunnel syndrome    Bilateral   Diabetes mellitus without complication (HCC)    Type II   GERD (gastroesophageal reflux disease)    Hernia, abdominal    Hypertension    Migraine    OSA (obstructive sleep apnea)    Plantar fasciitis of right foot    Pneumonia    hx   Shingles 11/2015   Shortness of breath dyspnea    with walking short distances    Past Surgical History:  Procedure Laterality Date   CARPAL TUNNEL RELEASE Left    GASTRIC BYPASS     HEEL SPUR RESECTION Right 08/01/2015   Procedure: HEEL SPUR EXCISIONS;  Surgeon: Melrose Nakayama, MD;  Location: [facility];  Service: Orthopedics;  Laterality: Right;   HEEL SPUR RESECTION Left 01/27/2016   Procedure: LEFT HEEL SPUR EXCISION AND TENDON ACHILLIES REPAIR;  Surgeon: Melrose Nakayama, MD;  Location: [facility];  Service: Orthopedics;  Laterality: Left;  PRONE POSITION   LAPAROSCOPIC GASTRIC SLEEVE RESECTION     PACEMAKER IMPLANT N/A 09/11/2018   Procedure: PACEMAKER IMPLANT;  Surgeon: Constance Haw, MD;  Location: [facility];  Service: Cardiovascular;  Laterality: N/A;   TUBAL LIGATION      Current Medications: Outpatient Medications Prior to Visit  Medication Sig Dispense Refill   albuterol (PROVENTIL) (2.5 MG/3ML) 0.083% nebulizer solution Take 2.5 mg by nebulization every 6 (six) hours as needed for wheezing or shortness of breath.      Ascorbic Acid (VITAMIN C PO) Take 1 tablet by mouth daily.      BREO ELLIPTA 200-25 MCG/INH AEPB INHALE 1 PUFF INTO LUNGS DAILY (Patient  taking differently: Inhale 1 puff into the lungs daily. ) 60 each 6   cetirizine (ZYRTEC) 10 MG tablet Take 10 mg by mouth daily as needed for allergies.      Cyanocobalamin (VITAMIN B-12 PO) Take 1 tablet by mouth at bedtime.     dicyclomine (BENTYL) 10 MG capsule Take 10 mg by mouth at bedtime.     Galcanezumab-gnlm (EMGALITY) 120 MG/ML SOAJ Inject 120 mLs into the skin every 30 (thirty) days. For migraine  prevention     IRON PO Take 1 tablet by mouth at bedtime.     nystatin (MYCOSTATIN/NYSTOP) powder Apply topically 4 (four) times daily. 15 g 0   omeprazole (PRILOSEC) 20 MG capsule Take 1 capsule (20 mg total) by mouth 2 (two) times daily before a meal. 60 capsule 0   pregabalin (LYRICA) 75 MG capsule Take 75 mg by mouth daily as needed (nerve pain).      PROVENTIL HFA 108 (90 Base) MCG/ACT inhaler INHALE 2 PUFFS INTO THE LUNGS EVERY 6 HOURS AS NEEDED FOR WHEEZING OR SHORTNESS OF BREATH (Patient taking differently: Inhale 2 puffs into the lungs every 6 (six) hours as needed for wheezing or shortness of breath. ) 6.7 g 0   ursodiol (ACTIGALL) 250 MG tablet Take 250 mg by mouth at bedtime.     VICTOZA 18 MG/3ML SOPN Inject 1.8 mg into the skin daily.     No facility-administered medications prior to visit.      Allergies:   Adhesive [tape], Aspirin, Nsaids, Penicillins, Tolmetin, Vancomycin, Oxycodone-acetaminophen, Vicodin [hydrocodone-acetaminophen], and Tramadol   Social History   Socioeconomic History   Marital status: Widowed    Spouse name: Not on file   Number of children: 2   Years of education: Not on file   Highest education level: Not on file  Occupational History   Occupation: Chemical engineer  Social Needs   Financial resource strain: Not hard at all   Food insecurity    Worry: Never true    Inability: Never true   Transportation needs    Medical: No    Non-medical: No  Tobacco Use   Smoking status: Never Smoker   Smokeless tobacco: Never Used     Tobacco comment: years ago may have smoked 1 cig or less a week  Substance and Sexual Activity   Alcohol use: No    Alcohol/week: 0.0 standard drinks   Drug use: No   Sexual activity: Not on file  Lifestyle   Physical activity    Days per week: 5 days    Minutes per session: 30 min   Stress: Very much  Relationships   Social connections    Talks on phone: More than three times a week    Gets together: Once a week    Attends religious service: More than 4 times per year    Active member of club or organization: No    Attends meetings of clubs or organizations: Never    Relationship status: Widowed  Other Topics Concern   Not on file  Social History Narrative   Not on file     Additional social history is notable in that she  is widowed.  She has children ages 81 and 33.  She is raising a  old niece.   Family History:  The patient's family history includes Brain cancer in her cousin; Breast cancer in her maternal aunt; CAD in her maternal grandmother; Depression in her mother; Heart attack in her maternal grandmother; Hypertension in her mother; Microcephaly in her maternal grandmother; Pulmonary embolism in her mother.   ROS General: Negative; No fevers, chills, or night sweats;  HEENT: Negative; No changes in vision or hearing, sinus congestion, difficulty swallowing Pulmonary: positive for asthma Cardiovascular: Status post pacemaker insertion in September 11, 2018 GI: Negative; No nausea, vomiting, diarrhea, or abdominal pain GU: Negative; No dysuria, hematuria, or difficulty voiding Musculoskeletal: Negative; no myalgias, joint pain, or weakness Hematologic/Oncology: Negative; no easy bruising, bleeding Endocrine: positive for  diabetes Neuro: Negative; no changes in balance, headaches Skin: Negative; No rashes or skin lesions Psychiatric: Negative; No behavioral problems, depression Sleep: positive for snoring, significant daytime sleepiness, hypersomnolence; no  bruxism, restless legs, hypnogognic hallucinations, no cataplexy Other comprehensive 14 point system review is negative.   PHYSICAL EXAM:   VS:  BP (!) 146/96    Pulse 64    Ht 5' 5"  (1.651 m)    Wt 273 lb (123.8 kg)    SpO2 99%    BMI 45.43 kg/m     Repeat blood pressure by me was 134/86  Wt Readings from Last 3 Encounters:  10/26/18 273 lb (123.8 kg)  09/13/18 270 lb 3.2 oz (122.6 kg)  09/11/18 260 lb (117.9 kg)    General: Alert, oriented, no distress.  Morbidly obese Skin: normal turgor, no rashes, warm and dry HEENT: Normocephalic, atraumatic. Pupils equal round and reactive to light; sclera anicteric; extraocular muscles intact;  Nose without nasal septal hypertrophy Mouth/Parynx benign; Mallinpatti scale 4 Neck: No JVD, no carotid bruits; normal carotid upstroke Lungs: clear to ausculatation and percussion; no wheezing or rales Chest wall: without tenderness to palpitation; pacemaker left infraclavicular region, Heart: PMI not displaced, RRR, s1 s2 normal, 1/6 systolic murmur, no diastolic murmur, no rubs, gallops, thrills, or heaves Abdomen: soft, nontender; no hepatosplenomehaly, BS+; abdominal aorta nontender and not dilated by palpation. Back: no CVA tenderness Pulses 2+ Musculoskeletal: full range of motion, normal strength, no joint deformities Extremities: no clubbing cyanosis or edema, Homan's sign negative  Neurologic: grossly nonfocal; Cranial nerves grossly wnl Psychologic: Normal mood and affect   Studies/Labs Reviewed:   ECG (independently read by me): Atrially paced rhythm at 60 bpm.  Normal intervals.  No ectopy.   I reviewed her ECG from August 31, 2018 which showed marked sinus bradycardia at 42 bpm.   I have independently reviewed her prior ECG from 11/14/15  shows normal sinus rhythm at 70 bpm without significant ST segment.  No ectopy.  Recent Labs: BMP Latest Ref Rng & Units 10/07/2018 08/31/2018 02/13/2018  Glucose 70 - 99 mg/dL 80 74 129(H)  BUN  6 - 20 mg/dL 13 10 10   Creatinine 0.44 - 1.00 mg/dL 1.05(H) 1.13(H) 1.08(H)  Sodium 135 - 145 mmol/L 139 140 140  Potassium 3.5 - 5.1 mmol/L 3.9 3.8 3.1(L)  Chloride 98 - 111 mmol/L 106 106 109  CO2 22 - 32 mmol/L 24 25 24   Calcium 8.9 - 10.3 mg/dL 8.9 9.1 8.5(L)     Hepatic Function Latest Ref Rng & Units 02/13/2018 08/21/2017 06/24/2017  Total Protein 6.5 - 8.1 g/dL 6.6 7.2 6.6  Albumin 3.5 - 5.0 g/dL 3.6 4.0 3.5  AST 15 - 41 U/L 15 28 18   ALT 0 - 44 U/L 14 21 20   Alk Phosphatase 38 - 126 U/L 57 73 67  Total Bilirubin 0.3 - 1.2 mg/dL 0.6 0.8 0.6    CBC Latest Ref Rng & Units 10/07/2018 08/31/2018 02/13/2018  WBC 4.0 - 10.5 K/uL 4.4 3.8(L) 4.4  Hemoglobin 12.0 - 15.0 g/dL 11.9(L) 12.2 11.3(L)  Hematocrit 36.0 - 46.0 % 35.7(L) 37.1 34.1(L)  Platelets 150 - 400 K/uL 187 193 192   Lab Results  Component Value Date   MCV 87.3 10/07/2018   MCV 86.3 08/31/2018   MCV 86.1 02/13/2018   Lab Results  Component Value Date   TSH 1.665 02/13/2018   Lab Results  Component Value Date   HGBA1C 5.5 02/13/2018     BNP  Component Value Date/Time   BNP 15.4 04/07/2016 0745    ProBNP    Component Value Date/Time   PROBNP 36.0 12/31/2013 0935     Lipid Panel  No results found for: CHOL, TRIG, HDL, CHOLHDL, VLDL, LDLCALC, LDLDIRECT   RADIOLOGY: Dg Chest 2 View  Result Date: 10/07/2018 CLINICAL DATA:  Chest pain EXAM: CHEST - 2 VIEW COMPARISON:  Chest x-ray dated 09/11/2018. FINDINGS: Heart size and mediastinal contours are within normal limits. Lungs are clear. No pleural effusion or pneumothorax is seen. No acute or suspicious osseous finding. LEFT chest wall pacemaker/ICD apparatus appears stable in position. IMPRESSION: No active cardiopulmonary disease. No evidence of pneumonia or pulmonary edema. Electronically Signed   By: Franki Cabot M.D.   On: 10/07/2018 13:10   Ct Angio Chest Pe W And/or Wo Contrast  Result Date: 10/07/2018 CLINICAL DATA:  Lightheadedness. Elevated  D-dimer. Left-sided chest pain while working. EXAM: CT ANGIOGRAPHY CHEST WITH CONTRAST TECHNIQUE: Multidetector CT imaging of the chest was performed using the standard protocol during bolus administration of intravenous contrast. Multiplanar CT image reconstructions and MIPs were obtained to evaluate the vascular anatomy. CONTRAST:  16m OMNIPAQUE IOHEXOL 350 MG/ML SOLN COMPARISON:  January 10, 2018. FINDINGS: Cardiovascular: Contrast injection is sufficient to demonstrate satisfactory opacification of the pulmonary arteries to the segmental level. There is no pulmonary embolus. The main pulmonary artery is within normal limits for size. There is no CT evidence of acute right heart strain. The visualized aorta is normal. Heart size is normal, without pericardial effusion. There is a dual chamber left-sided pacemaker in place. There is likely narrowing of the left subclavian vein secondary to the pacer leads. Mediastinum/Nodes: --No mediastinal or hilar lymphadenopathy. --No axillary lymphadenopathy. --No supraclavicular lymphadenopathy. --Normal thyroid gland. --there are postsurgical changes at the GE junction. There is a small to moderate-sized hiatal hernia. The patient appears to be status post prior gastric bypass. Lungs/Pleura: No pulmonary nodules or masses. No pleural effusion or pneumothorax. No focal airspace consolidation. No focal pleural abnormality. Upper Abdomen: No acute abnormality. Musculoskeletal: No chest wall abnormality. No acute or significant osseous findings. Review of the MIP images confirms the above findings. IMPRESSION: No evidence of pulmonary embolism or other acute intrathoracic process. Electronically Signed   By: CConstance HolsterM.D.   On: 10/07/2018 21:03     Additional studies/ records that were reviewed today include:  I reviewed her office records, ECG, and sleep study as well a of her CPAP use since set up.    ASSESSMENT:    1. OSA (obstructive sleep apnea)     2. Essential hypertension   3. Controlled type 2 diabetes mellitus without complication, without long-term current use of insulin ([facility]   4. Morbid obesity ([place]   5. Cardiac pacemaker in situ      PLAN:  Ms. CArletta Lumadueis a 48year-old female who I had seen in 2017 after she was diagnosed with sleep apnea.  Her AHI was moderate overall but sleep apnea was very severe with an AHI of 66.7 during REM sleep.  During her split-night evaluation she was titrated up to 19 cm water pressure.  Apparently, she had only use CPAP for approximately 1 year and for the last 2 years had been off therapy.  When I had seen her in 2017 I had a extensive discussion with her regarding the effects of untreated sleep apnea on her cardiovascular health.  Recently she has had issues with significant bradycardia and nocturnal bradycardia arrhythmias  may also be contributed by sleep apnea.  In the past I discussed the effects on potential resistant hypertension, atrial arrhythmias, nocturnal myocardial ischemia, TIA and CVA.  I had discussed that the predominance of REM sleep occurs in the second half of the night particularly with the severity of her sleep apnea with REM sleep the importance of using treatment for the nights entirety.  She has a history of super morbid obesity and in 2017 her BMI was 62.  Her BMI is now 45.43.  Since I saw her 1 month ago for reassessment of her sleep apnea, she has started to use CPAP.  [employer] is her DME company.  We were able to change her fixed setting to an auto mode.  On her download obtained today, her 95th percentile pressure is 7.5 with a maximum average pressure of 8.2.  She felt that she was not getting adequate pressure at CPAP initiation.  As result I will increase her minimum pressure to 7 and change her maximum pressure to 15.  I am this unable a ramp time and she should tolerate initiation at 7 cm of water pressure.  I again discussed the effects of sleep apnea on insulin  resistance, GERD, and the importance of continued weight loss and exercise for both cardiovascular and sleep benefits.  She is diabetic on Victoza.  On the above studies, we will obtain a new download in 4 to 6 weeks.  Adjustments will made as necessary.  She will follow-up with Dr. Debara Pickett for cardiology follow-up.  I will see her in 6 months for follow-up sleep evaluation.   Medication Adjustments/Labs and Tests Ordered: Current medicines are reviewed at length with the patient today.  Concerns regarding medicines are outlined above.  Medication changes, Labs and Tests ordered today are listed in the Patient Instructions below. Patient Instructions  Follow-Up: You will need a follow up appointment in [place].  Please call our office 2 months in advance, December 2020  to schedule this, February 2021 appointment.  You may see Pixie Casino, MD     Medication Instructions:  The current medical regimen is effective;  continue present plan and medications as directed. Please refer to the Current Medication list given to you today. If you need a refill on your cardiac medications before your next appointment, please call your pharmacy. Labwork: When you have labs (blood work) and your tests are completely normal, you will receive your results ONLY by [place] (if you have MyChart) -OR- A paper copy in the mail.  At [facility], you and your health needs are our priority.  As part of our continuing mission to provide you with exceptional heart care, we have created designated Provider Care Teams.  These Care Teams include your primary Cardiologist (physician) and Advanced Practice Providers (APPs -  Physician Assistants and Nurse Practitioners) who all work together to provide you with the care you need, when you need it.  Thank you for choosing [facility] at [facility]!!       Time spent: 40 minutes   Signed, Shelva Majestic, MD  10/28/2018 11:27 AM     [facility] [address], [place], [place], [place]  [address] Phone: [phone]

## 2018-10-26 NOTE — Patient Instructions (Signed)
Follow-Up: You will need a follow up appointment in 6 months-WITH DR [place].  Please call our office 2 months in advance, December 2020  to schedule this, February 2021 appointment.  You may see Pixie Casino, MD     Medication Instructions:  The current medical regimen is effective;  continue present plan and medications as directed. Please refer to the Current Medication list given to you today. If you need a refill on your cardiac medications before your next appointment, please call your pharmacy. Labwork: When you have labs (blood work) and your tests are completely normal, you will receive your results ONLY by [place] (if you have MyChart) -OR- A paper copy in the mail.  At [facility], you and your health needs are our priority.  As part of our continuing mission to provide you with exceptional heart care, we have created designated Provider Care Teams.  These Care Teams include your primary Cardiologist (physician) and Advanced Practice Providers (APPs -  Physician Assistants and Nurse Practitioners) who all work together to provide you with the care you need, when you need it.  Thank you for choosing [facility] at [facility]!!

## 2018-10-28 ENCOUNTER — Encounter: Payer: Self-pay | Admitting: Cardiovascular Disease

## 2018-12-19 ENCOUNTER — Ambulatory Visit (INDEPENDENT_AMBULATORY_CARE_PROVIDER_SITE_OTHER): Payer: Medicaid Other | Admitting: Cardiology

## 2018-12-19 ENCOUNTER — Ambulatory Visit (INDEPENDENT_AMBULATORY_CARE_PROVIDER_SITE_OTHER): Payer: Medicaid Other | Admitting: *Deleted

## 2018-12-19 ENCOUNTER — Encounter: Payer: Self-pay | Admitting: Cardiology

## 2018-12-19 ENCOUNTER — Other Ambulatory Visit: Payer: Self-pay

## 2018-12-19 VITALS — BP 144/88 | HR 75 | Ht 65.0 in | Wt 266.0 lb

## 2018-12-19 DIAGNOSIS — I495 Sick sinus syndrome: Secondary | ICD-10-CM | POA: Diagnosis not present

## 2018-12-19 DIAGNOSIS — Z79899 Other long term (current) drug therapy: Secondary | ICD-10-CM

## 2018-12-19 DIAGNOSIS — Z950 Presence of cardiac pacemaker: Secondary | ICD-10-CM

## 2018-12-19 LAB — CUP PACEART REMOTE DEVICE CHECK
Battery Remaining Longevity: 77 mo
Battery Remaining Percentage: 95.5 %
Battery Voltage: 3.01 V
Brady Statistic AP VP Percent: 1.8 %
Brady Statistic AP VS Percent: 58 %
Brady Statistic AS VP Percent: 1 %
Brady Statistic AS VS Percent: 40 %
Brady Statistic RA Percent Paced: 59 %
Brady Statistic RV Percent Paced: 1.9 %
Date Time Interrogation Session: 20201201054642
Implantable Lead Implant Date: 20200824
Implantable Lead Implant Date: 20200824
Implantable Lead Location: 753859
Implantable Lead Location: 753860
Implantable Pulse Generator Implant Date: 20200824
Lead Channel Impedance Value: 440 Ohm
Lead Channel Impedance Value: 540 Ohm
Lead Channel Pacing Threshold Amplitude: 0.5 V
Lead Channel Pacing Threshold Amplitude: 0.75 V
Lead Channel Pacing Threshold Pulse Width: 0.5 ms
Lead Channel Pacing Threshold Pulse Width: 0.5 ms
Lead Channel Sensing Intrinsic Amplitude: 5 mV
Lead Channel Sensing Intrinsic Amplitude: 5.4 mV
Lead Channel Setting Pacing Amplitude: 3.5 V
Lead Channel Setting Pacing Amplitude: 3.5 V
Lead Channel Setting Pacing Pulse Width: 0.5 ms
Lead Channel Setting Sensing Sensitivity: 2 mV
Pulse Gen Model: 2272
Pulse Gen Serial Number: 9150511

## 2018-12-19 MED ORDER — LOSARTAN POTASSIUM 100 MG PO TABS: 100.0000 mg | ORAL_TABLET | Freq: Every day | ORAL | 6 refills | Status: DC

## 2018-12-19 NOTE — Progress Notes (Signed)
Electrophysiology Office Note   Date:  12/19/2018   ID:  Melanie Hess, DOB [birth year], MRN [id number]  PCP:  [name], MD  Cardiologist:  [name] Primary Electrophysiologist:  [name] Jorja Loa, MD    No chief complaint on file.    History of Present Illness: Melanie Hess is a 48 y.o. female who is being seen today for the evaluation of near syncope at the request of [name], MD. Presenting today for electrophysiology evaluation.  She has a history of hypertension, type 2 diabetes, OSA on CPAP, and morbid obesity status post gastric sleeve.  She has had multiple episodes of syncope described as lightheadedness and room spinning.  She felt hot prior to her episodes of passing out.  One episode of syncope was at [employer] while working.  She went to the emergency room and was found to be bradycardic with heart rates in the 40s without evidence of arrhythmia or heart block.  She presented the emergency room 08/31/2018 after an episode of chest pain and syncope the day before.  All work-up was negative.  She was bradycardic in the emergency room without symptoms at the time.  Due to her sick sinus syndrome, she is now status post Saint Jude dual-chamber pacemaker implanted 09/11/2018.  Today, denies symptoms of palpitations, chest pain, shortness of breath, orthopnea, PND, lower extremity edema, claudication, dizziness, presyncope, syncope, bleeding, or neurologic sequela. The patient is tolerating medications without difficulties.  Overall she is doing well.  She has no chest pain or shortness of breath.  She has had no further episodes of syncope.  She has more energy since her device was implanted.   Past Medical History:  Diagnosis Date  . Ankle fracture, right 2001  . Anxiety   . Arthritis   . Asthma   . Carpal tunnel syndrome    Bilateral  . Diabetes mellitus without complication (HCC)    Type II  . GERD (gastroesophageal reflux disease)   . Hernia,  abdominal   . Hypertension   . Migraine   . OSA (obstructive sleep apnea)   . Plantar fasciitis of right foot   . Pneumonia    hx  . Shingles 11/2015  . Shortness of breath dyspnea    with walking short distances   Past Surgical History:  Procedure Laterality Date  . CARPAL TUNNEL RELEASE Left   . GASTRIC BYPASS    . HEEL SPUR RESECTION Right 08/01/2015   Procedure: HEEL SPUR EXCISIONS;  Surgeon: Marcene Corning, MD;  Location: [facility] OR;  Service: Orthopedics;  Laterality: Right;  . HEEL SPUR RESECTION Left 01/27/2016   Procedure: LEFT HEEL SPUR EXCISION AND TENDON ACHILLIES REPAIR;  Surgeon: Marcene Corning, MD;  Location: [facility] OR;  Service: Orthopedics;  Laterality: Left;  PRONE POSITION  . LAPAROSCOPIC GASTRIC SLEEVE RESECTION    . PACEMAKER IMPLANT N/A 09/11/2018   Procedure: PACEMAKER IMPLANT;  Surgeon: Regan Lemming, MD;  Location: [facility] INVASIVE CV LAB;  Service: Cardiovascular;  Laterality: N/A;  . TUBAL LIGATION       Current Outpatient Medications  Medication Sig Dispense Refill  . albuterol (PROVENTIL) (2.5 MG/3ML) 0.083% nebulizer solution Take 2.5 mg by nebulization every 6 (six) hours as needed for wheezing or shortness of breath.     . Ascorbic Acid (VITAMIN C PO) Take 1 tablet by mouth daily.     Marland Kitchen BREO ELLIPTA 200-25 MCG/INH AEPB INHALE 1 PUFF INTO LUNGS DAILY (Patient taking differently: Inhale 1 puff into the lungs  daily. ) 60 each 6  . cetirizine (ZYRTEC) 10 MG tablet Take 10 mg by mouth daily as needed for allergies.     . Cyanocobalamin (VITAMIN B-12 PO) Take 1 tablet by mouth at bedtime.    . dicyclomine (BENTYL) 10 MG capsule Take 10 mg by mouth at bedtime.    . Galcanezumab-gnlm (EMGALITY) 120 MG/ML SOAJ Inject 120 mLs into the skin every 30 (thirty) days. For migraine prevention    . IRON PO Take 1 tablet by mouth at bedtime.    Marland Kitchen. losartan (COZAAR) 100 MG tablet Take 1 tablet (100 mg total) by mouth daily. 30 tablet 6  . nystatin (MYCOSTATIN/NYSTOP) powder  Apply topically 4 (four) times daily. 15 g 0  . omeprazole (PRILOSEC) 20 MG capsule Take 1 capsule (20 mg total) by mouth 2 (two) times daily before a meal. 60 capsule 0  . pregabalin (LYRICA) 75 MG capsule Take 75 mg by mouth daily as needed (nerve pain).     Marland Kitchen. PROVENTIL HFA 108 (90 Base) MCG/ACT inhaler INHALE 2 PUFFS INTO THE LUNGS EVERY 6 HOURS AS NEEDED FOR WHEEZING OR SHORTNESS OF BREATH (Patient taking differently: Inhale 2 puffs into the lungs every 6 (six) hours as needed for wheezing or shortness of breath. ) 6.7 g 0  . ursodiol (ACTIGALL) 250 MG tablet Take 250 mg by mouth at bedtime.    Marland Kitchen. VICTOZA 18 MG/3ML SOPN Inject 1.8 mg into the skin daily.     No current facility-administered medications for this visit.     Allergies:   Adhesive [tape], Aspirin, Nsaids, Penicillins, Tolmetin, Vancomycin, Oxycodone-acetaminophen, Vicodin [hydrocodone-acetaminophen], and Tramadol   Social History:  The patient  reports that she has never smoked. She has never used smokeless tobacco. She reports that she does not drink alcohol or use drugs.   Family History:  The patient's family history includes Brain cancer in her cousin; Breast cancer in her maternal aunt; CAD in her maternal grandmother; Depression in her mother; Heart attack in her maternal grandmother; Hypertension in her mother; Microcephaly in her maternal grandmother; Pulmonary embolism in her mother.   ROS:  Please see the history of present illness.   Otherwise, review of systems is positive for none.   All other systems are reviewed and negative.   PHYSICAL EXAM: VS:  BP (!) 144/88   Pulse 75   Ht 5\' 5"  (1.651 m)   Wt 266 lb (120.7 kg)   SpO2 98%   BMI 44.26 kg/m  , BMI Body mass index is 44.26 kg/m. GEN: Well nourished, well developed, in no acute distress  HEENT: normal  Neck: no JVD, carotid bruits, or masses Cardiac: RRR; no murmurs, rubs, or gallops,no edema  Respiratory:  clear to auscultation bilaterally, normal work  of breathing GI: soft, nontender, nondistended, + BS MS: no deformity or atrophy  Skin: warm and dry, device site well healed Neuro:  Strength and sensation are intact Psych: euthymic mood, full affect  EKG:  EKG is ordered today. Personal review of the ekg ordered shows atrial paced, rate 75  Personal review of the device interrogation today. Results in Paceart   Recent Labs: 02/13/2018: ALT 14; Magnesium 1.8; TSH 1.665 10/07/2018: BUN 13; Creatinine, Ser 1.05; Hemoglobin 11.9; Platelets 187; Potassium 3.9; Sodium 139    Lipid Panel  No results found for: CHOL, TRIG, HDL, CHOLHDL, VLDL, LDLCALC, LDLDIRECT   Wt Readings from Last 3 Encounters:  12/19/18 266 lb (120.7 kg)  10/26/18 273 lb (123.8 kg)  09/13/18 270 lb 3.2 oz (122.6 kg)      Other studies Reviewed: Additional studies/ records that were reviewed today include: TTE 02/14/18  Review of the above records today demonstrates:   1. The left ventricle appears to be normal in size, has normal wall thickness 60-65% ejection fraction Spectral Doppler shows indeterminate pattern of diastolic filling.  2. Right ventricular systolic pressure is could not be assessed.  3. The right ventricle has normal size and normal systolic function.  4. Normal left atrial size.  5. Normal right atrial size.  6. The mitral valve normal in structure and function.  7. Normal tricuspid valve.  8. Aortic valve normal.  9. Pulmonic valve regurgitation is mild by color flow Doppler. 10. The inferior vena cava was dilated in size with >50% respiratory variablity. 11. No atrial level shunt detected by color flow Doppler.    ASSESSMENT AND PLAN:  1.  Sick sinus syndrome: Status post Saint Jude dual-chamber pacemaker implanted 09/11/2018.  Device functioning appropriately.  No changes.  2.  OSA: CPAP compliance encouraged  3.  Hypertension: Her is elevated today.  Alaney Witter start losartan 100 mg.  We Akshaya Toepfer get a basic metabolic in 2 weeks for  evaluation.    Current medicines are reviewed at length with the patient today.   The patient does not have concerns regarding her medicines.  The following changes were made today: Start losartan  Labs/ tests ordered today include:  Orders Placed This Encounter  Procedures  . Basic metabolic panel  . EKG 12-Lead     Disposition:   FU with Kyri Dai 9 months  Signed, Lashon Beringer Meredith Leeds, MD  12/19/2018 2:59 PM     [facility] [address] [place] [address] [phone] (office) [phone] (fax)

## 2018-12-19 NOTE — Patient Instructions (Addendum)
Medication Instructions:  Your physician has recommended you make the following change in your medication:  1. START Losartan 100 mg once daily  *If you need a refill on your cardiac medications before your next appointment, please call your pharmacy*  Labwork: Come back on 01/04/19 for labs.  Come anytime between 8am - 4pm If you have labs (blood work) drawn today and your tests are completely normal, you will receive your results only by:  MyChart Message (if you have MyChart) OR  A paper copy in the mail If you have any lab test that is abnormal or we need to change your treatment, we will call you to review the results.  Testing/Procedures: None ordered  Follow-Up: Remote monitoring is used to monitor your Pacemaker or ICD from home. This monitoring reduces the number of office visits required to check your device to one time per year. It allows [place] to keep an eye on the functioning of your device to ensure it is working properly. You are scheduled for a device check from home on 03/28/2019. You may send your transmission at any time that day. If you have a wireless device, the transmission will be sent automatically. After your physician reviews your transmission, you will receive a postcard with your next transmission date.  At [facility], you and your health needs are our priority.  As part of our continuing mission to provide you with exceptional heart care, we have created designated Provider Care Teams.  These Care Teams include your primary Cardiologist (physician) and Advanced Practice Providers (APPs -  Physician Assistants and Nurse Practitioners) who all work together to provide you with the care you need, when you need it.  You will need a follow up appointment in 9 months.  Please call our office 2 months in advance to schedule this appointment.  You may see Dr Elberta Fortis or one of the following Advanced Practice Providers on your designated Care Team:    Gypsy Balsam, NP   Francis Dowse, PA-C  Otilio Saber, [place]  Thank you for choosing [facility]!!   Dory Horn, RN [phone]  Any Other Special Instructions Will Be Listed Below (If Applicable).  Losartan tablets What is this medicine? LOSARTAN (loe SAR tan) is used to treat high blood pressure and to reduce the risk of stroke in certain patients. This drug also slows the progression of kidney disease in patients with diabetes. This medicine may be used for other purposes; ask your health care provider or pharmacist if you have questions. COMMON BRAND NAME(S): Cozaar What should I tell my health care provider before I take this medicine? They need to know if you have any of these conditions:  heart failure  kidney or liver disease  an unusual or allergic reaction to losartan, other medicines, foods, dyes, or preservatives  pregnant or trying to get pregnant  breast-feeding How should I use this medicine? Take this medicine by mouth with a glass of water. Follow the directions on the prescription label. This medicine can be taken with or without food. Take your doses at regular intervals. Do not take your medicine more often than directed. Talk to your pediatrician regarding the use of this medicine in children. Special care may be needed. Overdosage: If you think you have taken too much of this medicine contact a poison control center or emergency room at once. NOTE: This medicine is only for you. Do not share this medicine with others. What if I miss a dose? If you  miss a dose, take it as soon as you can. If it is almost time for your next dose, take only that dose. Do not take double or extra doses. What may interact with this medicine?  blood pressure medicines  diuretics, especially triamterene, spironolactone, or amiloride  fluconazole  NSAIDs, medicines for pain and inflammation, like ibuprofen or naproxen  potassium salts or potassium supplements  rifampin This list may  not describe all possible interactions. Give your health care provider a list of all the medicines, herbs, non-prescription drugs, or dietary supplements you use. Also tell them if you smoke, drink alcohol, or use illegal drugs. Some items may interact with your medicine. What should I watch for while using this medicine? Visit your doctor or health care professional for regular checks on your progress. Check your blood pressure as directed. Ask your doctor or health care professional what your blood pressure should be and when you should contact him or her. Call your doctor or health care professional if you notice an irregular or fast heart beat. Women should inform their doctor if they wish to become pregnant or think they might be pregnant. There is a potential for serious side effects to an unborn child, particularly in the second or third trimester. Talk to your health care professional or pharmacist for more information. You may get drowsy or dizzy. Do not drive, use machinery, or do anything that needs mental alertness until you know how this drug affects you. Do not stand or sit up quickly, especially if you are an older patient. This reduces the risk of dizzy or fainting spells. Alcohol can make you more drowsy and dizzy. Avoid alcoholic drinks. Avoid salt substitutes unless you are told otherwise by your doctor or health care professional. Do not treat yourself for coughs, colds, or pain while you are taking this medicine without asking your doctor or health care professional for advice. Some ingredients may increase your blood pressure. What side effects may I notice from receiving this medicine? Side effects that you should report to your doctor or health care professional as soon as possible:  confusion, dizziness, light headedness or fainting spells  decreased amount of urine passed  difficulty breathing or swallowing, hoarseness, or tightening of the throat  fast or irregular heart  beat, palpitations, or chest pain  skin rash, itching  swelling of your face, lips, tongue, hands, or feet Side effects that usually do not require medical attention (report to your doctor or health care professional if they continue or are bothersome):  cough  decreased sexual function or desire  headache  nasal congestion or stuffiness  nausea or stomach pain  sore or cramping muscles This list may not describe all possible side effects. Call your doctor for medical advice about side effects. You may report side effects to FDA at 1-800-FDA-1088. Where should I keep my medicine? Keep out of the reach of children. Store at room temperature between 15 and 30 degrees C (59 and 86 degrees F). Protect from light. Keep container tightly closed. Throw away any unused medicine after the expiration date. NOTE: This sheet is a summary. It may not cover all possible information. If you have questions about this medicine, talk to your doctor, pharmacist, or health care provider.  2020 [employer]vier/Gold Standard (2007-03-17 16:42:18)

## 2019-01-04 ENCOUNTER — Other Ambulatory Visit: Payer: Medicaid Other | Admitting: *Deleted

## 2019-01-04 ENCOUNTER — Other Ambulatory Visit: Payer: Self-pay

## 2019-01-04 DIAGNOSIS — Z79899 Other long term (current) drug therapy: Secondary | ICD-10-CM

## 2019-01-04 DIAGNOSIS — I495 Sick sinus syndrome: Secondary | ICD-10-CM

## 2019-01-04 LAB — BASIC METABOLIC PANEL
BUN/Creatinine Ratio: 14 (ref 9–23)
BUN: 13 mg/dL (ref 6–24)
CO2: 24 mmol/L (ref 20–29)
Calcium: 9.3 mg/dL (ref 8.7–10.2)
Chloride: 103 mmol/L (ref 96–106)
Creatinine, Ser: 0.96 mg/dL (ref 0.57–1.00)
GFR calc Af Amer: 80 mL/min/{1.73_m2} (ref >59)
GFR calc non Af Amer: 69 mL/min/{1.73_m2} (ref >59)
Glucose: 90 mg/dL (ref 65–99)
Potassium: 4.3 mmol/L (ref 3.5–5.2)
Sodium: 144 mmol/L (ref 134–144)

## 2019-01-10 ENCOUNTER — Other Ambulatory Visit: Payer: Self-pay

## 2019-01-10 ENCOUNTER — Emergency Department (HOSPITAL_COMMUNITY)
Admission: EM | Admit: 2019-01-10 | Discharge: 2019-01-10 | Disposition: A | Discharge status: Home / Self Care | Payer: Medicaid Other | Attending: Emergency Medicine | Admitting: Emergency Medicine

## 2019-01-10 ENCOUNTER — Encounter (HOSPITAL_COMMUNITY): Payer: Self-pay | Admitting: Emergency Medicine

## 2019-01-10 ENCOUNTER — Emergency Department (HOSPITAL_COMMUNITY): Payer: Medicaid Other

## 2019-01-10 DIAGNOSIS — E119 Type 2 diabetes mellitus without complications: Secondary | ICD-10-CM | POA: Insufficient documentation

## 2019-01-10 DIAGNOSIS — R0789 Other chest pain: Secondary | ICD-10-CM | POA: Diagnosis not present

## 2019-01-10 DIAGNOSIS — Z950 Presence of cardiac pacemaker: Secondary | ICD-10-CM | POA: Insufficient documentation

## 2019-01-10 DIAGNOSIS — Z20828 Contact with and (suspected) exposure to other viral communicable diseases: Secondary | ICD-10-CM | POA: Insufficient documentation

## 2019-01-10 DIAGNOSIS — Z79899 Other long term (current) drug therapy: Secondary | ICD-10-CM | POA: Insufficient documentation

## 2019-01-10 DIAGNOSIS — I10 Essential (primary) hypertension: Secondary | ICD-10-CM | POA: Diagnosis not present

## 2019-01-10 LAB — CBC WITH DIFFERENTIAL/PLATELET
Abs Immature Granulocytes: 0.01 10*3/uL (ref 0.00–0.07)
Basophils Absolute: 0 10*3/uL (ref 0.0–0.1)
Basophils Relative: 0 %
Eosinophils Absolute: 0.2 10*3/uL (ref 0.0–0.5)
Eosinophils Relative: 3 %
HCT: 39.7 % (ref 36.0–46.0)
Hemoglobin: 11.9 g/dL — ABNORMAL LOW (ref 12.0–15.0)
Immature Granulocytes: 0 %
Lymphocytes Relative: 43 %
Lymphs Abs: 2.3 10*3/uL (ref 0.7–4.0)
MCH: 28.6 pg (ref 26.0–34.0)
MCHC: 30 g/dL (ref 30.0–36.0)
MCV: 95.4 fL (ref 80.0–100.0)
Monocytes Absolute: 0.3 10*3/uL (ref 0.1–1.0)
Monocytes Relative: 6 %
Neutro Abs: 2.5 10*3/uL (ref 1.7–7.7)
Neutrophils Relative %: 48 %
Platelets: 161 10*3/uL (ref 150–400)
RBC: 4.16 MIL/uL (ref 3.87–5.11)
RDW: 15.7 % — ABNORMAL HIGH (ref 11.5–15.5)
WBC: 5.2 10*3/uL (ref 4.0–10.5)
nRBC: 0 % (ref 0.0–0.2)

## 2019-01-10 LAB — COMPREHENSIVE METABOLIC PANEL
ALT: 17 U/L (ref 0–44)
AST: 16 U/L (ref 15–41)
Albumin: 3.4 g/dL — ABNORMAL LOW (ref 3.5–5.0)
Alkaline Phosphatase: 63 U/L (ref 38–126)
Anion gap: 11 (ref 5–15)
BUN: 15 mg/dL (ref 6–20)
CO2: 20 mmol/L — ABNORMAL LOW (ref 22–32)
Calcium: 8.4 mg/dL — ABNORMAL LOW (ref 8.9–10.3)
Chloride: 112 mmol/L — ABNORMAL HIGH (ref 98–111)
Creatinine, Ser: 1.1 mg/dL — ABNORMAL HIGH (ref 0.44–1.00)
GFR calc Af Amer: >60 mL/min (ref >60)
GFR calc non Af Amer: 58 mL/min — ABNORMAL LOW (ref >60)
Glucose, Bld: 74 mg/dL (ref 70–99)
Potassium: 4.3 mmol/L (ref 3.5–5.1)
Sodium: 143 mmol/L (ref 135–145)
Total Bilirubin: 0.6 mg/dL (ref 0.3–1.2)
Total Protein: 6.2 g/dL — ABNORMAL LOW (ref 6.5–8.1)

## 2019-01-10 LAB — CBG MONITORING, ED: Glucose-Capillary: 70 mg/dL (ref 70–99)

## 2019-01-10 LAB — TROPONIN I (HIGH SENSITIVITY)
Troponin I (High Sensitivity): 3 ng/L (ref <18)
Troponin I (High Sensitivity): 5 ng/L (ref <18)

## 2019-01-10 MED ORDER — ACETAMINOPHEN 500 MG PO TABS
1000.0000 mg | ORAL_TABLET | Freq: Once | ORAL | Status: AC
  Administered 2019-01-10: 16:00:00 1000 mg via ORAL
  Filled 2019-01-10: qty 2

## 2019-01-10 NOTE — Discharge Instructions (Addendum)
Continue your home medications as previously prescribed. Return to the ED if you start to develop worsening pain, shortness of breath, fever, leg swelling.

## 2019-01-10 NOTE — ED Provider Notes (Signed)
[facility] EMERGENCY DEPARTMENT Provider Note   CSN: [id number] Arrival date & time: 01/10/19  1415     History Chief Complaint  Patient presents with  . Chest Pain    Melanie Hess is a 48 y.o. female with a past medical history of diabetes, hypertension, SSS s/p pacemaker placement in August 2020, asthma, obesity presenting to the ED with a chief complaint of chest pain.  Approximately 6 hours prior to arrival was standing up at a self checkout counter when she began having pressure sensation in her left chest with sharp pains radiating to her back.  The pressure sensation is constant and the sharp pains have been intermittent without specific aggravating alleviating factor.  She reports history of similar symptoms in the past without specific cause.  She does note that ever since she had her pacemaker placed in August, she has had pain at the site of the surgical incision.  She mentioned this to her cardiologist numerous times but she states that "they just told me it is normal because it is healing."  She reports compliance with her home medications.  She saw her cardiologist last week.  She denies any shortness of breath, cough, fever, nausea, vomiting, abdominal pain, diaphoresis, leg swelling, injuries or falls.  States that the nitroglycerin did not help with her pain but did give her headache.  HPI     Past Medical History:  Diagnosis Date  . Ankle fracture, right 2001  . Anxiety   . Arthritis   . Asthma   . Carpal tunnel syndrome    Bilateral  . Diabetes mellitus without complication (HCC)    Type II  . GERD (gastroesophageal reflux disease)   . Hernia, abdominal   . Hypertension   . Migraine   . OSA (obstructive sleep apnea)   . Plantar fasciitis of right foot   . Pneumonia    hx  . Shingles 11/2015  . Shortness of breath dyspnea    with walking short distances    Patient Active Problem List   Diagnosis Date Noted  . Hypokalemia  02/14/2018  . Syncope 02/13/2018  . Nausea & vomiting 08/22/2017  . 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 Morbid obesity with BMI of 50.0-59.9, adult (HCC) 08/09/2017  . Esophageal reflux 03/10/2017  . Preoperative clearance 08/12/2016  . Chest pain 04/07/2016  . LVH (left ventricular hypertrophy) 04/07/2016  . Hepatic steatosis 04/07/2016  . Diabetes mellitus with complication (HCC)   . Moderate persistent asthma 12/09/2015  . OSA on CPAP 10/23/2015  . Pain of right heel 08/01/2015  . Chronic pain   . Anemia 03/24/2014  . Diabetes mellitus type 2, controlled, without complications (HCC) 03/24/2014  . Asthma exacerbation 03/24/2014  . Hypertension 02/28/2014  . Morbid obesity (HCC) 02/28/2014  . DOE (dyspnea on exertion) 02/28/2014  . Dyspnea and respiratory abnormality 02/14/2014    Past Surgical History:  Procedure Laterality Date  . CARPAL TUNNEL RELEASE Left   . GASTRIC BYPASS    . HEEL SPUR RESECTION Right 08/01/2015   Procedure: HEEL SPUR EXCISIONS;  Surgeon: Marcene Corning, MD;  Location: [facility] OR;  Service: Orthopedics;  Laterality: Right;  . HEEL SPUR RESECTION Left 01/27/2016   Procedure: LEFT HEEL SPUR EXCISION AND TENDON ACHILLIES REPAIR;  Surgeon: Marcene Corning, MD;  Location: [facility] OR;  Service: Orthopedics;  Laterality: Left;  PRONE POSITION  . LAPAROSCOPIC GASTRIC SLEEVE RESECTION    . PACEMAKER IMPLANT N/A 09/11/2018   Procedure: PACEMAKER IMPLANT;  Surgeon: Regan Lemming, MD;  Location: [facility] INVASIVE CV LAB;  Service: Cardiovascular;  Laterality: N/A;  . TUBAL LIGATION       OB History   No obstetric history on file.     Family History  Problem Relation Age of Onset  . Hypertension Mother   . Pulmonary embolism Mother   . Depression Mother   . Breast cancer Maternal Aunt   . Brain cancer Cousin   . CAD Maternal Grandmother   . Microcephaly Maternal Grandmother   . Heart attack Maternal Grandmother     Social History   Tobacco Use  .  Smoking status: Never Smoker  . Smokeless tobacco: Never Used  . Tobacco comment: years ago may have smoked 1 cig or less a week  Substance Use Topics  . Alcohol use: No    Alcohol/week: 0.0 standard drinks  . Drug use: No    Home Medications Prior to Admission medications   Medication Sig Start Date End Date Taking? Authorizing Provider  albuterol (PROVENTIL) (2.5 MG/3ML) 0.083% nebulizer solution Take 2.5 mg by nebulization every 8 (eight) hours.    Yes [provider]  BREO ELLIPTA 200-25 MCG/INH AEPB INHALE 1 PUFF INTO LUNGS DAILY Patient taking differently: Inhale 1 puff into the lungs daily.  03/30/16  Yes Kalman Shan, MD  cetirizine (ZYRTEC) 10 MG tablet Take 10 mg by mouth daily.    Yes [provider]  clonazePAM (KLONOPIN) 2 MG tablet Take 2 mg by mouth daily as needed for anxiety.  11/21/18  Yes [provider]  Cyanocobalamin (VITAMIN B-12 PO) Take 1 tablet by mouth at bedtime.   Yes [provider]  dicyclomine (BENTYL) 10 MG capsule Take 10 mg by mouth daily with lunch.    Yes [provider]  Galcanezumab-gnlm (EMGALITY) 120 MG/ML SOAJ Inject 120 mLs into the skin every 30 (thirty) days. For migraine prevention   Yes [provider]  IRON PO Take 1 tablet by mouth at bedtime.   Yes [provider]  losartan (COZAAR) 100 MG tablet Take 1 tablet (100 mg total) by mouth daily. 12/19/18  Yes Camnitz, Will Daphine Deutscher, MD  nystatin (MYCOSTATIN/NYSTOP) powder Apply topically 4 (four) times daily. Patient taking differently: Apply 1 application topically 2 (two) times daily.  08/31/18  Yes Sabas Sous, MD  omeprazole (PRILOSEC) 20 MG capsule Take 1 capsule (20 mg total) by mouth 2 (two) times daily before a meal. 02/14/18  Yes Madelyn Flavors A, MD  pregabalin (LYRICA) 100 MG capsule Take 100 mg by mouth 2 (two) times daily. 11/21/18  Yes [provider]  PROVENTIL HFA 108 (90 Base) MCG/ACT inhaler INHALE 2 PUFFS  INTO THE LUNGS EVERY 6 HOURS AS NEEDED FOR WHEEZING OR SHORTNESS OF BREATH Patient taking differently: Inhale 2 puffs into the lungs every 6 (six) hours as needed for wheezing or shortness of breath.  03/17/17  Yes Bevelyn Ngo, NP  tiZANidine (ZANAFLEX) 2 MG tablet Take 2 mg by mouth every 8 (eight) hours as needed for muscle spasms. 10/19/18  Yes [provider]  ursodiol (ACTIGALL) 250 MG tablet Take 250 mg by mouth at bedtime as needed (stomach pain).  01/08/18  Yes [provider]  VICTOZA 18 MG/3ML SOPN Inject 1.8 mg into the skin at bedtime.  05/22/18  Yes [provider]    Allergies    Adhesive [tape], Aspirin, Nsaids, Penicillins, Tolmetin, Vancomycin, Oxycodone-acetaminophen, Vicodin [hydrocodone-acetaminophen], and Tramadol  Review of Systems   Review  of Systems  Constitutional: Negative for appetite change, chills and fever.  HENT: Negative for ear pain, rhinorrhea, sneezing and sore throat.   Eyes: Negative for photophobia and visual disturbance.  Respiratory: Negative for cough, chest tightness, shortness of breath and wheezing.   Cardiovascular: Positive for chest pain. Negative for palpitations.  Gastrointestinal: Negative for abdominal pain, blood in stool, constipation, diarrhea, nausea and vomiting.  Genitourinary: Negative for dysuria, hematuria and urgency.  Musculoskeletal: Negative for myalgias.  Skin: Negative for rash.  Neurological: Negative for dizziness, weakness and light-headedness.    Physical Exam Updated Vital Signs BP (!) 151/99   Pulse 60   Temp 98.7 F (37.1 C) (Oral)   Resp 12   LMP 02/13/2017   SpO2 100%   Physical Exam Vitals and nursing note reviewed.  Constitutional:      General: She is not in acute distress.    Appearance: She is well-developed. She is obese.     Comments: Speaking in complete sentences without difficulty.  HENT:     Head: Normocephalic and atraumatic.     Nose: Nose normal.  Eyes:      General: No scleral icterus.       Left eye: No discharge.     Conjunctiva/sclera: Conjunctivae normal.  Cardiovascular:     Rate and Rhythm: Normal rate and regular rhythm.     Heart sounds: Normal heart sounds. No murmur. No friction rub. No gallop.   Pulmonary:     Effort: Pulmonary effort is normal. No respiratory distress.     Breath sounds: Normal breath sounds.  Abdominal:     General: Bowel sounds are normal. There is no distension.     Palpations: Abdomen is soft.     Tenderness: There is no abdominal tenderness. There is no guarding.  Musculoskeletal:        General: Normal range of motion.     Cervical back: Normal range of motion and neck supple.     Comments: No lower extremity edema, erythema or calf tenderness bilaterally.  Skin:    General: Skin is warm and dry.     Findings: No rash.     Comments: Well healing surgical incision noted on L chest wall without drainage or surrounding erythema.  Neurological:     Mental Status: She is alert.     Motor: No abnormal muscle tone.     Coordination: Coordination normal.     ED Results / Procedures / Treatments   Labs (all labs ordered are listed, but only abnormal results are displayed) Labs Reviewed  COMPREHENSIVE METABOLIC PANEL - Abnormal; Notable for the following components:      Result Value   Chloride 112 (*)    CO2 20 (*)    Creatinine, Ser 1.10 (*)    Calcium 8.4 (*)    Total Protein 6.2 (*)    Albumin 3.4 (*)    GFR calc non Af Amer 58 (*)    All other components within normal limits  CBC WITH DIFFERENTIAL/PLATELET - Abnormal; Notable for the following components:   Hemoglobin 11.9 (*)    RDW 15.7 (*)    All other components within normal limits  NOVEL CORONAVIRUS, NAA (HOSP ORDER, SEND-OUT TO REF LAB; TAT 18-24 HRS)  CBG MONITORING, ED  TROPONIN I (HIGH SENSITIVITY)  TROPONIN I (HIGH SENSITIVITY)    EKG EKG Interpretation  Date/Time:  Wednesday January 10 2019 14:20:36 EST Ventricular Rate:    60 PR Interval:    QRS Duration: 98  QT Interval:  437 QTC Calculation: 437 R Axis:   60 Text Interpretation: Sinus rhythm No significant change since last tracing Confirmed by Richardean CanalYao, David H [phone]) on 01/10/2019 2:24:05 PM   Radiology DG Chest 2 View  Result Date: 01/10/2019 CLINICAL DATA:  48 year old female with chest pain. EXAM: CHEST - 2 VIEW COMPARISON:  Chest radiograph dated 10/07/2018 FINDINGS: No focal consolidation, pleural effusion, or pneumothorax. The cardiac silhouette is within normal limits. Left pectoral pacemaker device. No acute osseous pathology. Multiple surgical clips noted in the upper abdomen. IMPRESSION: No active cardiopulmonary disease. Electronically Signed   By: Elgie CollardArash  Radparvar M.D.   On: 01/10/2019 15:35    Procedures Procedures (including critical care time)  Medications Ordered in ED Medications  acetaminophen (TYLENOL) tablet 1,000 mg (1,000 mg Oral Given 01/10/19 1544)    ED Course  I have reviewed the triage vital signs and the nursing notes.  Pertinent labs & imaging results that were available during my care of the patient were reviewed by me and considered in my medical decision making (see chart for details).  Clinical Course as of Jan 09 2026  Wed Jan 10, 2019  1952 Pacemaker interrogation without abnormal findings.   [HK]    Clinical Course User Index [HK] Dietrich PatesKhatri, Lilyann Gravelle, PA-C   MDM Rules/Calculators/A&P                      [name] was evaluated in Emergency Department on 01/10/19  for the symptoms described in the history of present illness. He/she was evaluated in the context of the global COVID-19 pandemic, which necessitated consideration that the patient might be at risk for infection with the SARS-CoV-2 virus that causes COVID-19. Institutional protocols and algorithms that pertain to the evaluation of patients at risk for COVID-19 are in a state of rapid change based on information released by regulatory bodies  including the CDC and federal and state organizations. These policies and algorithms were followed during the patient's care in the ED.  48 year old female with a past medical history of diabetes, hypertension, sick sinus syndrome status post pacemaker placement in August 2020, asthma presenting to the ED with a chief complaint of chest pain.  Reports pressure sensation in her left chest with pain radiating to her back.  She also complains of pain at the site of her pacemaker placement which has been ongoing since her surgery in August.  States that the current chest pain is different from her usual chest pain related to her incision.  She was seen evaluated by cardiologist last week.  Reports compliance with her home medications. Patient overall well appearing on exam. No lower extremity edema, erythema or calf tenderness that would concern me for DVT. Does not appear fluid overloaded.  Incision site is well healing without overlying skin changes. CTAs of the chest in the past have been negative.  Chest x-ray here is unremarkable.  EKG shows normal sinus rhythm.  Initial and delta troponin are both negative.  CBC, CMP unremarkable.  Pacemaker interrogated without any concerning findings. Patient symptoms improved with Tylenol given here.  Suspect that her symptoms could be musculoskeletal in nature. She is low risk by Wells criteria for PE, HEART score of 5 here. I was able to offer admission to the patient but she is comfortable with following up with her cardiologist and PCP and is requesting discharge since her ride is waiting for her in the waiting room.  Doubt dissection as the cause of  her pain as her symptoms improved here and she is hemodynamically stable.   Patient is hemodynamically stable, in NAD, and able to ambulate in the ED. Evaluation does not show pathology that would require ongoing emergent intervention or inpatient treatment. I explained the diagnosis to the patient. Pain has been managed  and has no complaints prior to discharge. Patient is comfortable with above plan and is stable for discharge at this time. All questions were answered prior to disposition. Strict return precautions for returning to the ED were discussed. Encouraged follow up with PCP.   An After Visit Summary was printed and given to the patient.   Portions of this note were generated with [employer]. Dictation errors may occur despite best attempts at proofreading.  Final Clinical Impression(s) / ED Diagnoses Final diagnoses:  Chest wall pain    Rx / DC Orders ED Discharge Orders    None       Delia Heady, PA-C 01/10/19 2027    Charlesetta Shanks, MD 01/10/19 2059

## 2019-01-10 NOTE — ED Triage Notes (Signed)
Pt here from work via [employer] [facility] for chest pain that began at 1100 today. Pt reports central/left chest pain that radiates to back, constant but worse w/ deep breaths. EMS gave 0.4 nitroglycerin w/ no relief. Pt allergic to aspirin. Pt had a pacemaker placed in July, and endorses tenderness at surgical site since. VSS, NSR

## 2019-01-10 NOTE — ED Notes (Signed)
Pt verbalized understanding of discharge instructions. Follow up care and pain management reviewed. Pt had no further questions at this time

## 2019-01-10 NOTE — ED Notes (Signed)
[employer] called this RN and said that pacemaker is working appropriately.

## 2019-01-12 LAB — NOVEL CORONAVIRUS, NAA (HOSP ORDER, SEND-OUT TO REF LAB; TAT 18-24 HRS): SARS-CoV-2, NAA: NOT DETECTED

## 2019-01-13 NOTE — Progress Notes (Signed)
PPM remote 

## 2019-01-21 ENCOUNTER — Other Ambulatory Visit: Payer: Self-pay

## 2019-01-21 ENCOUNTER — Emergency Department (HOSPITAL_COMMUNITY): Payer: Medicaid Other

## 2019-01-21 ENCOUNTER — Encounter (HOSPITAL_COMMUNITY): Payer: Self-pay

## 2019-01-21 ENCOUNTER — Emergency Department (HOSPITAL_COMMUNITY)
Admission: EM | Admit: 2019-01-21 | Discharge: 2019-01-21 | Disposition: A | Discharge status: Home / Self Care | Payer: Medicaid Other | Attending: Emergency Medicine | Admitting: Emergency Medicine

## 2019-01-21 DIAGNOSIS — R0602 Shortness of breath: Secondary | ICD-10-CM | POA: Insufficient documentation

## 2019-01-21 DIAGNOSIS — R0789 Other chest pain: Secondary | ICD-10-CM | POA: Diagnosis not present

## 2019-01-21 DIAGNOSIS — Z5321 Procedure and treatment not carried out due to patient leaving prior to being seen by health care provider: Secondary | ICD-10-CM | POA: Diagnosis not present

## 2019-01-21 MED ORDER — SODIUM CHLORIDE 0.9% FLUSH: 3.0000 mL | Freq: Once | INTRAVENOUS | Status: DC

## 2019-01-21 NOTE — ED Notes (Signed)
Pt called for blood draw with no response

## 2019-01-21 NOTE — ED Triage Notes (Signed)
Pt reports chest pain and SOB. She reports a negative COVID test on 12/23, but states that she may need to be rechecked. Denies fever. Hx of asthma and states that she usually feels this way when it is cold. Has a pacemaker.

## 2019-01-22 ENCOUNTER — Emergency Department (HOSPITAL_COMMUNITY): Payer: Medicaid Other

## 2019-01-22 ENCOUNTER — Encounter (HOSPITAL_COMMUNITY): Payer: Self-pay | Admitting: *Deleted

## 2019-01-22 ENCOUNTER — Emergency Department (HOSPITAL_COMMUNITY)
Admission: EM | Admit: 2019-01-22 | Discharge: 2019-01-22 | Disposition: A | Discharge status: Home / Self Care | Payer: Medicaid Other | Attending: Emergency Medicine | Admitting: Emergency Medicine

## 2019-01-22 ENCOUNTER — Other Ambulatory Visit: Payer: Self-pay

## 2019-01-22 DIAGNOSIS — U071 COVID-19: Secondary | ICD-10-CM | POA: Diagnosis not present

## 2019-01-22 DIAGNOSIS — R0602 Shortness of breath: Secondary | ICD-10-CM | POA: Diagnosis present

## 2019-01-22 DIAGNOSIS — R05 Cough: Secondary | ICD-10-CM | POA: Insufficient documentation

## 2019-01-22 DIAGNOSIS — E119 Type 2 diabetes mellitus without complications: Secondary | ICD-10-CM | POA: Diagnosis not present

## 2019-01-22 DIAGNOSIS — I10 Essential (primary) hypertension: Secondary | ICD-10-CM | POA: Insufficient documentation

## 2019-01-22 DIAGNOSIS — J189 Pneumonia, unspecified organism: Secondary | ICD-10-CM | POA: Insufficient documentation

## 2019-01-22 DIAGNOSIS — R0789 Other chest pain: Secondary | ICD-10-CM | POA: Insufficient documentation

## 2019-01-22 DIAGNOSIS — Z79899 Other long term (current) drug therapy: Secondary | ICD-10-CM | POA: Insufficient documentation

## 2019-01-22 LAB — I-STAT BETA HCG BLOOD, ED (MC, WL, AP ONLY): I-stat hCG, quantitative: <5 m[IU]/mL (ref <5)

## 2019-01-22 LAB — BASIC METABOLIC PANEL
Anion gap: 9 (ref 5–15)
BUN: 9 mg/dL (ref 6–20)
CO2: 25 mmol/L (ref 22–32)
Calcium: 8.7 mg/dL — ABNORMAL LOW (ref 8.9–10.3)
Chloride: 107 mmol/L (ref 98–111)
Creatinine, Ser: 1.02 mg/dL — ABNORMAL HIGH (ref 0.44–1.00)
GFR calc Af Amer: >60 mL/min (ref >60)
GFR calc non Af Amer: >60 mL/min (ref >60)
Glucose, Bld: 93 mg/dL (ref 70–99)
Potassium: 3.9 mmol/L (ref 3.5–5.1)
Sodium: 141 mmol/L (ref 135–145)

## 2019-01-22 LAB — CBC
HCT: 37.4 % (ref 36.0–46.0)
Hemoglobin: 12.2 g/dL (ref 12.0–15.0)
MCH: 28 pg (ref 26.0–34.0)
MCHC: 32.6 g/dL (ref 30.0–36.0)
MCV: 86 fL (ref 80.0–100.0)
Platelets: 149 10*3/uL — ABNORMAL LOW (ref 150–400)
RBC: 4.35 MIL/uL (ref 3.87–5.11)
RDW: 14.3 % (ref 11.5–15.5)
WBC: 2.7 10*3/uL — ABNORMAL LOW (ref 4.0–10.5)
nRBC: 0 % (ref 0.0–0.2)

## 2019-01-22 LAB — TROPONIN I (HIGH SENSITIVITY): Troponin I (High Sensitivity): 5 ng/L (ref <18)

## 2019-01-22 LAB — BRAIN NATRIURETIC PEPTIDE: B Natriuretic Peptide: 18 pg/mL (ref 0.0–100.0)

## 2019-01-22 LAB — SARS CORONAVIRUS 2 (TAT 6-24 HRS): SARS Coronavirus 2: POSITIVE — AB

## 2019-01-22 MED ORDER — SODIUM CHLORIDE 0.9% FLUSH: 3.0000 mL | Freq: Once | INTRAVENOUS | Status: DC

## 2019-01-22 MED ORDER — METHOCARBAMOL 500 MG PO TABS: 500.0000 mg | ORAL_TABLET | Freq: Two times a day (BID) | ORAL | 0 refills | Status: DC

## 2019-01-22 MED ORDER — DOXYCYCLINE HYCLATE 100 MG PO CAPS: 100.0000 mg | ORAL_CAPSULE | Freq: Two times a day (BID) | ORAL | 0 refills | Status: AC

## 2019-01-22 NOTE — ED Notes (Signed)
Attempts to obtain bloodwork made by phlebotomy and this RN; another phlebotomist, Misty Stanley, will attempt to collect when available; Dr. Denton Lank made aware

## 2019-01-22 NOTE — ED Provider Notes (Addendum)
[facility] EMERGENCY DEPARTMENT Provider Note   CSN: [id number] Arrival date & time: 01/22/19  0802     History Chief Complaint  Patient presents with  . Shortness of Breath    Melanie Hess is a 49 y.o. female.  HPI  Patient is a 49 year old female with a history of asthma, hypertension, diabetes, anxiety and OSA presents today for shortness of breath which she states has been ongoing for the past 1 week but states that for the past 3 days she has been coughing and feeling considerably more fatigued.  States that she has severe chest pain when she coughs but denies any resting chest pain.  States that chest pain is located in the sternum and radiates to her back but only occurs when she coughs.  Denies any exertional chest pain, pleuritic chest pain nausea, or diaphoresis.  States that she has had decreased appetite over the past week and states that she may be feeling weak as a result of this.  States that she just does not feel like eating.  States that when she has attempted to eat she symptoms movements small amount.  Denies any other nausea however.  States that her legs feel slight more swollen than usual.  Denies any history of heart failure.  Denies any history of blood clots.  Denies any pleuritic chest pain.  Patient takes her Breo inhaler daily and use albuterol as needed.     Past Medical History:  Diagnosis Date  . Ankle fracture, right 2001  . Anxiety   . Arthritis   . Asthma   . Carpal tunnel syndrome    Bilateral  . Diabetes mellitus without complication (HCC)    Type II  . GERD (gastroesophageal reflux disease)   . Hernia, abdominal   . Hypertension   . Migraine   . OSA (obstructive sleep apnea)   . Plantar fasciitis of right foot   . Pneumonia    hx  . Shingles 11/2015  . Shortness of breath dyspnea    with walking short distances    Patient Active Problem List   Diagnosis Date Noted  . Hypokalemia 02/14/2018  . Syncope  02/13/2018  . Nausea & vomiting 08/22/2017  . 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 Morbid obesity with BMI of 50.0-59.9, adult (HCC) 08/09/2017  . Esophageal reflux 03/10/2017  . Preoperative clearance 08/12/2016  . Chest pain 04/07/2016  . LVH (left ventricular hypertrophy) 04/07/2016  . Hepatic steatosis 04/07/2016  . Diabetes mellitus with complication (HCC)   . Moderate persistent asthma 12/09/2015  . OSA on CPAP 10/23/2015  . Pain of right heel 08/01/2015  . Chronic pain   . Anemia 03/24/2014  . Diabetes mellitus type 2, controlled, without complications (HCC) 03/24/2014  . Asthma exacerbation 03/24/2014  . Hypertension 02/28/2014  . Morbid obesity (HCC) 02/28/2014  . DOE (dyspnea on exertion) 02/28/2014  . Dyspnea and respiratory abnormality 02/14/2014    Past Surgical History:  Procedure Laterality Date  . CARPAL TUNNEL RELEASE Left   . GASTRIC BYPASS    . HEEL SPUR RESECTION Right 08/01/2015   Procedure: HEEL SPUR EXCISIONS;  Surgeon: Marcene Corning, MD;  Location: [facility] OR;  Service: Orthopedics;  Laterality: Right;  . HEEL SPUR RESECTION Left 01/27/2016   Procedure: LEFT HEEL SPUR EXCISION AND TENDON ACHILLIES REPAIR;  Surgeon: Marcene Corning, MD;  Location: [facility] OR;  Service: Orthopedics;  Laterality: Left;  PRONE POSITION  . LAPAROSCOPIC GASTRIC SLEEVE RESECTION    . PACEMAKER  IMPLANT N/A 09/11/2018   Procedure: PACEMAKER IMPLANT;  Surgeon: Regan Lemming, MD;  Location: [facility] INVASIVE CV LAB;  Service: Cardiovascular;  Laterality: N/A;  . TUBAL LIGATION       OB History   No obstetric history on file.     Family History  Problem Relation Age of Onset  . Hypertension Mother   . Pulmonary embolism Mother   . Depression Mother   . Breast cancer Maternal Aunt   . Brain cancer Cousin   . CAD Maternal Grandmother   . Microcephaly Maternal Grandmother   . Heart attack Maternal Grandmother     Social History   Tobacco Use  . Smoking status: Never  Smoker  . Smokeless tobacco: Never Used  . Tobacco comment: years ago may have smoked 1 cig or less a week  Substance Use Topics  . Alcohol use: No    Alcohol/week: 0.0 standard drinks  . Drug use: No    Home Medications Prior to Admission medications   Medication Sig Start Date End Date Taking? Authorizing Provider  albuterol (PROVENTIL) (2.5 MG/3ML) 0.083% nebulizer solution Take 2.5 mg by nebulization every 8 (eight) hours.     [provider]  BREO ELLIPTA 200-25 MCG/INH AEPB INHALE 1 PUFF INTO LUNGS DAILY Patient taking differently: Inhale 1 puff into the lungs daily.  03/30/16   Kalman Shan, MD  cetirizine (ZYRTEC) 10 MG tablet Take 10 mg by mouth daily.     [provider]  clonazePAM (KLONOPIN) 2 MG tablet Take 2 mg by mouth daily as needed for anxiety.  11/21/18   [provider]  Cyanocobalamin (VITAMIN B-12 PO) Take 1 tablet by mouth at bedtime.    [provider]  dicyclomine (BENTYL) 10 MG capsule Take 10 mg by mouth daily with lunch.     [provider]  Galcanezumab-gnlm (EMGALITY) 120 MG/ML SOAJ Inject 120 mLs into the skin every 30 (thirty) days. For migraine prevention    [provider]  IRON PO Take 1 tablet by mouth at bedtime.    [provider]  losartan (COZAAR) 100 MG tablet Take 1 tablet (100 mg total) by mouth daily. 12/19/18   Camnitz, Andree Coss, MD  methocarbamol (ROBAXIN) 500 MG tablet Take 1 tablet (500 mg total) by mouth 2 (two) times daily. 01/22/19   Gailen Shelter, PA  nystatin (MYCOSTATIN/NYSTOP) powder Apply topically 4 (four) times daily. Patient taking differently: Apply 1 application topically 2 (two) times daily.  08/31/18   Sabas Sous, MD  omeprazole (PRILOSEC) 20 MG capsule Take 1 capsule (20 mg total) by mouth 2 (two) times daily before a meal. 02/14/18   Clydie Braun, MD  pregabalin (LYRICA) 100 MG capsule Take 100 mg by mouth 2 (two) times daily. 11/21/18   [provider]  PROVENTIL HFA 108 (90 Base) MCG/ACT inhaler INHALE 2 PUFFS INTO THE LUNGS EVERY 6 HOURS AS NEEDED FOR WHEEZING OR SHORTNESS OF BREATH Patient taking differently: Inhale 2 puffs into the lungs every 6 (six) hours as needed for wheezing or shortness of breath.  03/17/17   Bevelyn Ngo, NP  tiZANidine (ZANAFLEX) 2 MG tablet Take 2 mg by mouth every 8 (eight) hours as needed for muscle spasms. 10/19/18   [provider]  ursodiol (ACTIGALL) 250 MG tablet Take 250 mg by mouth at bedtime as needed (stomach pain).  01/08/18   [provider]  VICTOZA 18 MG/3ML SOPN Inject 1.8 mg into the skin at  bedtime.  05/22/18   [provider]    Allergies    Adhesive [tape], Aspirin, Nsaids, Penicillins, Tolmetin, Vancomycin, Oxycodone-acetaminophen, Vicodin [hydrocodone-acetaminophen], and Tramadol  Review of Systems   Review of Systems  Constitutional: Positive for fatigue. Negative for chills and fever.  HENT: Positive for congestion.   Eyes: Negative for pain.  Respiratory: Positive for cough and shortness of breath.   Cardiovascular: Positive for leg swelling. Negative for chest pain.  Gastrointestinal: Negative for abdominal pain and vomiting.  Genitourinary: Negative for dysuria.  Musculoskeletal: Negative for myalgias.  Skin: Negative for rash.  Neurological: Negative for dizziness and headaches.    Physical Exam Updated Vital Signs BP (!) 116/94 (BP Location: Left Arm)   Pulse 60   Temp 99.3 F (37.4 C) (Oral)   Resp 18   LMP 02/13/2017   SpO2 99%   Physical Exam Vitals and nursing note reviewed.  Constitutional:      General: She is not in acute distress.    Appearance: She is obese. She is not ill-appearing.  HENT:     Head: Normocephalic and atraumatic.     Nose: Nose normal.  Eyes:     General: No scleral icterus. Cardiovascular:     Rate and Rhythm: Normal rate and regular rhythm.     Pulses: Normal pulses.     Heart sounds: Normal  heart sounds.  Pulmonary:     Effort: Pulmonary effort is normal. No respiratory distress.     Breath sounds: No wheezing.     Comments: Increased work of breathing, speaking in full sentences without difficulty.  Mildly decreased lung sounds right base. Abdominal:     Palpations: Abdomen is soft.     Tenderness: There is no abdominal tenderness.  Musculoskeletal:     Cervical back: Normal range of motion.     Right lower leg: Edema present.     Left lower leg: Edema present.     Comments: Moderate, 1+ bilateral lower extremity edema  Right paralumbar and para thoracic musculature is tender to palpation  Skin:    General: Skin is warm and dry.     Capillary Refill: Capillary refill takes less than 2 seconds.  Neurological:     Mental Status: She is alert. Mental status is at baseline.  Psychiatric:        Mood and Affect: Mood normal.        Behavior: Behavior normal.     ED Results / Procedures / Treatments   Labs (all labs ordered are listed, but only abnormal results are displayed) Labs Reviewed  BASIC METABOLIC PANEL - Abnormal; Notable for the following components:      Result Value   Creatinine, Ser 1.02 (*)    Calcium 8.7 (*)    All other components within normal limits  CBC - Abnormal; Notable for the following components:   WBC 2.7 (*)    Platelets 149 (*)    All other components within normal limits  SARS CORONAVIRUS 2 (TAT 6-24 HRS)  BRAIN NATRIURETIC PEPTIDE  I-STAT BETA HCG BLOOD, ED ([facility], [facility], [facility] ONLY)  TROPONIN I (HIGH SENSITIVITY)    EKG None  Radiology DG Chest 2 View  Result Date: 01/22/2019 CLINICAL DATA:  Shortness of breath with chest pain and cough EXAM: CHEST - 2 VIEW COMPARISON:  January 21, 2019 FINDINGS: There is ill-defined opacity in the right base region. The lungs elsewhere are clear. Heart size and pulmonary vascularity are normal. Pacemaker leads are attached to the  right atrium and right ventricle. No adenopathy. No pneumothorax. No bone  lesions. IMPRESSION: Ill-defined opacity right base concerning for developing pneumonia. Lungs elsewhere clear. Cardiac silhouette normal. Pacemaker leads attached to right atrium and right ventricle. Electronically Signed   By: Lowella Grip III M.D.   On: 01/22/2019 08:53   DG Chest 2 View  Result Date: 01/21/2019 CLINICAL DATA:  Pt reported chest pain, SOB, and a dry cough x 2 days. EXAM: CHEST - 2 VIEW COMPARISON:  Chest radiograph 01/10/2019 FINDINGS: Stable cardiomediastinal contours. Left chest ICD in place. Low lung volumes. No new focal pulmonary opacity. No pneumothorax or pleural effusion. No acute finding in the visualized skeleton. IMPRESSION: No active cardiopulmonary disease. Electronically Signed   By: Audie Pinto M.D.   On: 01/21/2019 07:31    Procedures Procedures (including critical care time)  Medications Ordered in ED Medications  sodium chloride flush (NS) 0.9 % injection 3 mL (has no administration in time range)    ED Course  I have reviewed the triage vital signs and the nursing notes.  Pertinent labs & imaging results that were available during my care of the patient were reviewed by me and considered in my medical decision making (see chart for details).    MDM Rules/Calculators/A&P                      Patient is 49 year old female with history of asthma, hypertension, diabetes, anxiety and OSA.  Presents today for shortness of breath that she states has been ongoing for a long period of time.  The causes for shortness of breath include but are not limited to Cardiac (AHF, pericardial effusion and tamponade, arrhythmias, ischemia, etc) Respiratory (COPD, asthma, pneumonia, pneumothorax, primary pulmonary hypertension) Hematological (anemia) Neuromuscular (ALS, Guillain-Barr, etc)  As patient's chief complaint today is cough that is painful, low-grade fever and fatigue with myalgias suspect that patient may have pneumonia, viral infection, Covid.   Chest x-ray shows evidence of infiltrate.  On my review of her x-ray there is a small focal infiltrate that is progressed from her x-ray yesterday.  Doubt pulmonary embolism as patient does not have purely pleuritic chest pain she is also not tachycardic and is satting 99% on room air.  Doubt any form of VQ mismatch.  Patient does not have any risk factors for fungal pneumonia.  Patient is also not have any diarrhea or abdominal pain consistent with Legionella pneumonia.   Patient is taking her inhalers as prescribed.  She is using Breo once daily and albuterol occasionally.  Patient has mildly low WBC which is more indicative of a viral illness however will cover with CAP pneumonia antibiotics.  We will also obtain Covid swab sent out.  ACS unlikely as patient has negative troponin and her chest x-ray is only present with coughing is not exertional.  Doubt pulmonary embolism as discussed above.  Doubt Borges syndrome.  EKG independently reviewed by myself is a paced atrial rhythm--no signs of acute ischemia.  No ST changes.  No WPW or LGL, no evidence of Brugada or long QT  Patient's blood work is unremarkable.  Mild leukopenia suggestive of viral cause of her symptoms.  BNP within normal limits.  We will treat empirically for pneumonia.  Patient is allergic to penicillins.  Will prescribe doxycycline.  She is no history of alcoholism, active cancer, immunosuppression.  Does have controlled diabetes.  Doubt the patient needs accommodation therapy at this time.  Patient also discharged with Robaxin for  musculoskeletal pain as there is likely some aspect of this.    Melanie Hess was evaluated in Emergency Department on 01/22/2019 for the symptoms described in the history of present illness. She was evaluated in the context of the global COVID-19 pandemic, which necessitated consideration that the patient might be at risk for infection with the SARS-CoV-2 virus that causes COVID-19.  Institutional protocols and algorithms that pertain to the evaluation of patients at risk for COVID-19 are in a state of rapid change based on information released by regulatory bodies including the CDC and federal and state organizations. These policies and algorithms were followed during the patient's care in the ED.  The medical records were personally reviewed by myself. I personally reviewed all lab results and interpreted all imaging studies and either concurred with their official read or contacted radiology for clarification.   This patient appears reasonably screened and I doubt any other medical condition requiring further workup, evaluation, or treatment in the ED at this time prior to discharge.   Patient's vitals are WNL apart from vital sign abnormalities discussed above, patient is in NAD, and able to ambulate in the ED at their baseline. Pain has been managed or a plan has been made for home management and has no complaints prior to discharge. Patient is comfortable with above plan and for discharge at this time. All questions were answered prior to disposition. Results from the ER workup discussed with the patient face to face and all questions answered to the best of my ability. The patient is safe for discharge with strict return precautions. Patient appears safe for discharge with appropriate follow-up. Conveyed my impression with the patient and they voiced understanding and are agreeable to plan.    I discussed this case with my attending physician who cosigned this note including patient's presenting symptoms, physical exam, and planned diagnostics and interventions. Attending physician stated agreement with plan or made changes to plan which were implemented. Attending physician assessed patient at bedside.   An After Visit Summary was printed and given to the patient.  Portions of this note were generated with Scientist, clinical (histocompatibility and immunogenetics)Dragon dictation software. Dictation errors may occur despite best  attempts at proofreading.   Final Clinical Impression(s) / ED Diagnoses Final diagnoses:  Community acquired pneumonia of right lower lobe of lung  Shortness of breath  Other chest pain    Rx / DC Orders ED Discharge Orders         Ordered    methocarbamol (ROBAXIN) 500 MG tablet  2 times daily     01/22/19 1431           Solon AugustaFondaw, Terrin Meddaugh [place], [place] 01/22/19 1436    Cathren LaineSteinl, Kevin, MD 01/22/19 1638    Gailen ShelterFondaw, Gelsey Amyx S, PA 01/22/19 1656    Cathren LaineSteinl, Kevin, MD 01/23/19 [id number]

## 2019-01-22 NOTE — ED Triage Notes (Signed)
Pt arrived by ems for sob. Was seen at [facility] yesterday for same and dc home. Spo2 100% on room air with ems. bp 130/90.

## 2019-01-22 NOTE — ED Notes (Signed)
Pt given water to drink per [place], PA

## 2019-01-22 NOTE — ED Notes (Signed)
Unable to get pt blood

## 2019-01-22 NOTE — ED Notes (Signed)
Patient verbalizes understanding of discharge instructions. Opportunity for questioning and answers were provided. Pt discharged from ED. 

## 2019-01-22 NOTE — Discharge Instructions (Addendum)
Take antibiotics as prescribed complete course. Please follow-up with your primary care doctor. I also prescribed you Robaxin which is a muscle relaxer.  Please use only as needed for pain.  This may help with the back and chest pain that you are experiencing.

## 2019-01-23 ENCOUNTER — Other Ambulatory Visit: Payer: Self-pay | Admitting: Nurse Practitioner

## 2019-01-23 DIAGNOSIS — U071 COVID-19: Secondary | ICD-10-CM

## 2019-01-23 DIAGNOSIS — E119 Type 2 diabetes mellitus without complications: Secondary | ICD-10-CM

## 2019-01-23 NOTE — Progress Notes (Signed)
  I connected by phone with Melanie Hess on 01/23/2019 at 10:20 AM to discuss the potential use of an new treatment for mild to moderate COVID-19 viral infection in non-hospitalized patients.  This patient is a 49 y.o. female that meets the FDA criteria for Emergency Use Authorization of bamlanivimab or casirivimab\imdevimab.  Has a (+) direct SARS-CoV-2 viral test result  Has mild or moderate COVID-19   Is ? 49 years of age and weighs ? 40 kg  Is NOT hospitalized due to COVID-19  Is NOT requiring oxygen therapy or requiring an increase in baseline oxygen flow rate due to COVID-19  Is within 10 days of symptom onset  Has at least one of the high risk factor(s) for progression to severe COVID-19 and/or hospitalization as defined in EUA.  Specific high risk criteria : Diabetes   I have spoken and communicated the following to the patient or parent/caregiver:  1. FDA has authorized the emergency use of bamlanivimab and casirivimab\imdevimab for the treatment of mild to moderate COVID-19 in adults and pediatric patients with positive results of direct SARS-CoV-2 viral testing who are 50 years of age and older weighing at least 40 kg, and who are at high risk for progressing to severe COVID-19 and/or hospitalization.  2. The significant known and potential risks and benefits of bamlanivimab and casirivimab\imdevimab, and the extent to which such potential risks and benefits are unknown.  3. Information on available alternative treatments and the risks and benefits of those alternatives, including clinical trials.  4. Patients treated with bamlanivimab and casirivimab\imdevimab should continue to self-isolate and use infection control measures (e.g., wear mask, isolate, social distance, avoid sharing personal items, clean and disinfect "high touch" surfaces, and frequent handwashing) according to CDC guidelines.   5. The patient or parent/caregiver has the option to accept or refuse  bamlanivimab or casirivimab\imdevimab .  After reviewing this information with the patient, The patient agreed to proceed with receiving the bamlanimivab infusion and will be provided a copy of the Fact sheet prior to receiving the infusion.Ivonne Andrew 01/23/2019 10:20 AM

## 2019-01-25 ENCOUNTER — Ambulatory Visit (HOSPITAL_COMMUNITY)
Admission: RE | Admit: 2019-01-25 | Discharge: 2019-01-25 | Disposition: A | Discharge status: Home / Self Care | Payer: Medicaid Other | Source: Ambulatory Visit | Attending: Pulmonary Disease | Admitting: Pulmonary Disease

## 2019-01-25 DIAGNOSIS — U071 COVID-19: Secondary | ICD-10-CM | POA: Diagnosis present

## 2019-01-25 DIAGNOSIS — E119 Type 2 diabetes mellitus without complications: Secondary | ICD-10-CM | POA: Diagnosis present

## 2019-01-25 DIAGNOSIS — Z23 Encounter for immunization: Secondary | ICD-10-CM | POA: Diagnosis not present

## 2019-01-25 MED ORDER — SODIUM CHLORIDE 0.9 % IV SOLN
700.0000 mg | Freq: Once | INTRAVENOUS | Status: AC
  Administered 2019-01-25: 09:00:00 700 mg via INTRAVENOUS
  Filled 2019-01-25: qty 20

## 2019-01-25 MED ORDER — DIPHENHYDRAMINE HCL 50 MG/ML IJ SOLN: 50.0000 mg | Freq: Once | INTRAMUSCULAR | Status: DC | PRN

## 2019-01-25 MED ORDER — ALBUTEROL SULFATE HFA 108 (90 BASE) MCG/ACT IN AERS: 2.0000 | INHALATION_SPRAY | Freq: Once | RESPIRATORY_TRACT | Status: DC | PRN

## 2019-01-25 MED ORDER — SODIUM CHLORIDE 0.9 % IV SOLN
INTRAVENOUS | Status: DC | PRN
  Administered 2019-01-25: 250 mL via INTRAVENOUS

## 2019-01-25 MED ORDER — METHYLPREDNISOLONE SODIUM SUCC 125 MG IJ SOLR: 125.0000 mg | Freq: Once | INTRAMUSCULAR | Status: DC | PRN

## 2019-01-25 MED ORDER — EPINEPHRINE 0.3 MG/0.3ML IJ SOAJ: 0.3000 mg | Freq: Once | INTRAMUSCULAR | Status: DC | PRN

## 2019-01-25 MED ORDER — FAMOTIDINE IN NACL 20-0.9 MG/50ML-% IV SOLN: 20.0000 mg | Freq: Once | INTRAVENOUS | Status: DC | PRN

## 2019-01-25 NOTE — Progress Notes (Signed)
  Diagnosis: COVID-19  Physician: Dr. Delford Field  Procedure: [facility] Med: bamlanivimab infusion - Provided patient with bamlanimivab fact sheet for patients, parents and caregivers prior to infusion.  Complications: No immediate complications noted.  Discharge: Discharged home   Sherlonda Flater, Luetta Nutting 01/25/2019

## 2019-01-25 NOTE — Discharge Instructions (Signed)

## 2019-01-26 ENCOUNTER — Ambulatory Visit (HOSPITAL_COMMUNITY): Payer: Medicaid Other

## 2019-02-08 MED ORDER — DEXAMETHASONE SODIUM PHOSPHATE 10 MG/ML IJ SOLN
INTRAMUSCULAR | Status: AC
  Filled 2019-02-08: qty 1

## 2019-02-08 MED ORDER — KETAMINE HCL 10 MG/ML IJ SOLN
INTRAMUSCULAR | Status: AC
  Filled 2019-02-08: qty 1

## 2019-02-08 MED ORDER — LIDOCAINE 2% (20 MG/ML) 5 ML SYRINGE
INTRAMUSCULAR | Status: AC
  Filled 2019-02-08: qty 5

## 2019-02-08 MED ORDER — FENTANYL CITRATE (PF) 250 MCG/5ML IJ SOLN
INTRAMUSCULAR | Status: AC
  Filled 2019-02-08: qty 5

## 2019-02-08 MED ORDER — MIDAZOLAM HCL 2 MG/2ML IJ SOLN
INTRAMUSCULAR | Status: AC
  Filled 2019-02-08: qty 4

## 2019-02-08 MED ORDER — PHENYLEPHRINE HCL (PRESSORS) 10 MG/ML IV SOLN
INTRAVENOUS | Status: AC
  Filled 2019-02-08: qty 1

## 2019-02-08 MED ORDER — HYDRALAZINE HCL 20 MG/ML IJ SOLN
INTRAMUSCULAR | Status: AC
  Filled 2019-02-08: qty 1

## 2019-02-08 MED ORDER — ROCURONIUM BROMIDE 10 MG/ML (PF) SYRINGE
PREFILLED_SYRINGE | INTRAVENOUS | Status: AC
  Filled 2019-02-08: qty 10

## 2019-02-08 MED ORDER — ONDANSETRON HCL 4 MG/2ML IJ SOLN
INTRAMUSCULAR | Status: AC
  Filled 2019-02-08: qty 2

## 2019-03-20 ENCOUNTER — Ambulatory Visit (INDEPENDENT_AMBULATORY_CARE_PROVIDER_SITE_OTHER): Payer: Medicaid Other | Admitting: *Deleted

## 2019-03-20 DIAGNOSIS — Z950 Presence of cardiac pacemaker: Secondary | ICD-10-CM | POA: Diagnosis not present

## 2019-03-20 LAB — CUP PACEART REMOTE DEVICE CHECK
Battery Remaining Longevity: 113 mo
Battery Remaining Percentage: 95.5 %
Battery Voltage: 3.01 V
Brady Statistic AP VP Percent: 1.6 %
Brady Statistic AP VS Percent: 54 %
Brady Statistic AS VP Percent: 1 %
Brady Statistic AS VS Percent: 45 %
Brady Statistic RA Percent Paced: 54 %
Brady Statistic RV Percent Paced: 1.7 %
Date Time Interrogation Session: 20210302042757
Implantable Lead Implant Date: 20200824
Implantable Lead Implant Date: 20200824
Implantable Lead Location: 753859
Implantable Lead Location: 753860
Implantable Pulse Generator Implant Date: 20200824
Lead Channel Impedance Value: 460 Ohm
Lead Channel Impedance Value: 510 Ohm
Lead Channel Pacing Threshold Amplitude: 0.5 V
Lead Channel Pacing Threshold Amplitude: 1 V
Lead Channel Pacing Threshold Pulse Width: 0.5 ms
Lead Channel Pacing Threshold Pulse Width: 0.5 ms
Lead Channel Sensing Intrinsic Amplitude: 4.6 mV
Lead Channel Sensing Intrinsic Amplitude: 5 mV
Lead Channel Setting Pacing Amplitude: 2 V
Lead Channel Setting Pacing Amplitude: 2.5 V
Lead Channel Setting Pacing Pulse Width: 0.5 ms
Lead Channel Setting Sensing Sensitivity: 2 mV
Pulse Gen Model: 2272
Pulse Gen Serial Number: 9150511

## 2019-03-21 NOTE — Progress Notes (Signed)
PPM Remote  

## 2019-04-16 ENCOUNTER — Encounter (HOSPITAL_COMMUNITY): Payer: Self-pay

## 2019-04-16 ENCOUNTER — Other Ambulatory Visit: Payer: Self-pay

## 2019-04-16 ENCOUNTER — Ambulatory Visit (HOSPITAL_COMMUNITY)
Admission: EM | Admit: 2019-04-16 | Discharge: 2019-04-16 | Disposition: A | Discharge status: Home / Self Care | Payer: Medicaid Other | Attending: Family Medicine | Admitting: Family Medicine

## 2019-04-16 DIAGNOSIS — M545 Low back pain, unspecified: Secondary | ICD-10-CM

## 2019-04-16 MED ORDER — CYCLOBENZAPRINE HCL 10 MG PO TABS: 10.0000 mg | ORAL_TABLET | Freq: Two times a day (BID) | ORAL | 0 refills | Status: AC | PRN
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
## 2019-04-16 NOTE — Discharge Instructions (Signed)
Please try heat  Please try tylenol  Please try the muscle relaxer.  Please follow up if your symptoms fail to improve.

## 2019-05-01 ENCOUNTER — Telehealth: Payer: Self-pay | Admitting: Cardiovascular Disease

## 2019-05-01 NOTE — Telephone Encounter (Signed)
LMOM RE: F/U Visit 

## 2019-06-19 ENCOUNTER — Ambulatory Visit (INDEPENDENT_AMBULATORY_CARE_PROVIDER_SITE_OTHER): Payer: Medicaid Other | Admitting: *Deleted

## 2019-06-19 DIAGNOSIS — I495 Sick sinus syndrome: Secondary | ICD-10-CM

## 2019-06-19 LAB — CUP PACEART REMOTE DEVICE CHECK
Battery Remaining Longevity: 110 mo
Battery Remaining Percentage: 95.5 %
Battery Voltage: 3.01 V
Brady Statistic AP VP Percent: 1.9 %
Brady Statistic AP VS Percent: 51 %
Brady Statistic AS VP Percent: 1 %
Brady Statistic AS VS Percent: 47 %
Brady Statistic RA Percent Paced: 53 %
Brady Statistic RV Percent Paced: 2 %
Date Time Interrogation Session: 20210601040903
Implantable Lead Implant Date: 20200824
Implantable Lead Implant Date: 20200824
Implantable Lead Location: 753859
Implantable Lead Location: 753860
Implantable Pulse Generator Implant Date: 20200824
Lead Channel Impedance Value: 410 Ohm
Lead Channel Impedance Value: 490 Ohm
Lead Channel Pacing Threshold Amplitude: 0.5 V
Lead Channel Pacing Threshold Amplitude: 1 V
Lead Channel Pacing Threshold Pulse Width: 0.5 ms
Lead Channel Pacing Threshold Pulse Width: 0.5 ms
Lead Channel Sensing Intrinsic Amplitude: 3.6 mV
Lead Channel Sensing Intrinsic Amplitude: 5 mV
Lead Channel Setting Pacing Amplitude: 2 V
Lead Channel Setting Pacing Amplitude: 2.5 V
Lead Channel Setting Pacing Pulse Width: 0.5 ms
Lead Channel Setting Sensing Sensitivity: 2 mV
Pulse Gen Model: 2272
Pulse Gen Serial Number: 9150511

## 2019-06-20 NOTE — Progress Notes (Signed)
Remote pacemaker transmission.   

## 2019-09-18 ENCOUNTER — Ambulatory Visit (INDEPENDENT_AMBULATORY_CARE_PROVIDER_SITE_OTHER): Payer: Medicaid Other | Admitting: *Deleted

## 2019-09-18 DIAGNOSIS — I495 Sick sinus syndrome: Secondary | ICD-10-CM

## 2019-09-18 LAB — CUP PACEART REMOTE DEVICE CHECK
Battery Remaining Longevity: 110 mo
Battery Remaining Percentage: 95.5 %
Battery Voltage: 3.01 V
Brady Statistic AP VP Percent: 2.1 %
Brady Statistic AP VS Percent: 53 %
Brady Statistic AS VP Percent: 1 %
Brady Statistic AS VS Percent: 45 %
Brady Statistic RA Percent Paced: 54 %
Brady Statistic RV Percent Paced: 2.2 %
Date Time Interrogation Session: 20210831040013
Implantable Lead Implant Date: 20200824
Implantable Lead Implant Date: 20200824
Implantable Lead Location: 753859
Implantable Lead Location: 753860
Implantable Pulse Generator Implant Date: 20200824
Lead Channel Impedance Value: 430 Ohm
Lead Channel Impedance Value: 550 Ohm
Lead Channel Pacing Threshold Amplitude: 0.5 V
Lead Channel Pacing Threshold Amplitude: 1 V
Lead Channel Pacing Threshold Pulse Width: 0.5 ms
Lead Channel Pacing Threshold Pulse Width: 0.5 ms
Lead Channel Sensing Intrinsic Amplitude: 3.9 mV
Lead Channel Sensing Intrinsic Amplitude: 5 mV
Lead Channel Setting Pacing Amplitude: 2 V
Lead Channel Setting Pacing Amplitude: 2.5 V
Lead Channel Setting Pacing Pulse Width: 0.5 ms
Lead Channel Setting Sensing Sensitivity: 2 mV
Pulse Gen Model: 2272
Pulse Gen Serial Number: 9150511

## 2019-09-19 NOTE — Progress Notes (Signed)
Remote pacemaker transmission.   

## 2019-12-18 ENCOUNTER — Ambulatory Visit (INDEPENDENT_AMBULATORY_CARE_PROVIDER_SITE_OTHER): Payer: Medicaid Other

## 2019-12-18 DIAGNOSIS — I495 Sick sinus syndrome: Secondary | ICD-10-CM | POA: Diagnosis not present

## 2019-12-18 LAB — CUP PACEART REMOTE DEVICE CHECK
Battery Remaining Longevity: 116 mo
Battery Remaining Percentage: 95.5 %
Battery Voltage: 3.01 V
Brady Statistic AP VP Percent: 2.2 %
Brady Statistic AP VS Percent: 52 %
Brady Statistic AS VP Percent: 1 %
Brady Statistic AS VS Percent: 45 %
Brady Statistic RA Percent Paced: 53 %
Brady Statistic RV Percent Paced: 2.3 %
Date Time Interrogation Session: 20211130040014
Implantable Lead Implant Date: 20200824
Implantable Lead Implant Date: 20200824
Implantable Lead Location: 753859
Implantable Lead Location: 753860
Implantable Pulse Generator Implant Date: 20200824
Lead Channel Impedance Value: 430 Ohm
Lead Channel Impedance Value: 540 Ohm
Lead Channel Pacing Threshold Amplitude: 0.5 V
Lead Channel Pacing Threshold Amplitude: 1 V
Lead Channel Pacing Threshold Pulse Width: 0.5 ms
Lead Channel Pacing Threshold Pulse Width: 0.5 ms
Lead Channel Sensing Intrinsic Amplitude: 3.9 mV
Lead Channel Sensing Intrinsic Amplitude: 5 mV
Lead Channel Setting Pacing Amplitude: 2 V
Lead Channel Setting Pacing Amplitude: 2.5 V
Lead Channel Setting Pacing Pulse Width: 0.5 ms
Lead Channel Setting Sensing Sensitivity: 2 mV
Pulse Gen Model: 2272
Pulse Gen Serial Number: 9150511

## 2019-12-24 NOTE — Progress Notes (Signed)
Remote pacemaker transmission.   

## 2020-03-18 ENCOUNTER — Ambulatory Visit (INDEPENDENT_AMBULATORY_CARE_PROVIDER_SITE_OTHER): Payer: Medicaid Other

## 2020-03-18 DIAGNOSIS — I495 Sick sinus syndrome: Secondary | ICD-10-CM | POA: Diagnosis not present

## 2020-03-18 LAB — CUP PACEART REMOTE DEVICE CHECK
Battery Remaining Longevity: 118 mo
Battery Remaining Percentage: 95.5 %
Battery Voltage: 3.01 V
Brady Statistic AP VP Percent: 2.3 %
Brady Statistic AP VS Percent: 51 %
Brady Statistic AS VP Percent: 1 %
Brady Statistic AS VS Percent: 47 %
Brady Statistic RA Percent Paced: 52 %
Brady Statistic RV Percent Paced: 2.6 %
Date Time Interrogation Session: 20220301040035
Implantable Lead Implant Date: 20200824
Implantable Lead Implant Date: 20200824
Implantable Lead Location: 753859
Implantable Lead Location: 753860
Implantable Pulse Generator Implant Date: 20200824
Lead Channel Impedance Value: 440 Ohm
Lead Channel Impedance Value: 550 Ohm
Lead Channel Pacing Threshold Amplitude: 0.5 V
Lead Channel Pacing Threshold Amplitude: 1 V
Lead Channel Pacing Threshold Pulse Width: 0.5 ms
Lead Channel Pacing Threshold Pulse Width: 0.5 ms
Lead Channel Sensing Intrinsic Amplitude: 4.6 mV
Lead Channel Sensing Intrinsic Amplitude: 4.9 mV
Lead Channel Setting Pacing Amplitude: 2 V
Lead Channel Setting Pacing Amplitude: 2.5 V
Lead Channel Setting Pacing Pulse Width: 0.5 ms
Lead Channel Setting Sensing Sensitivity: 2 mV
Pulse Gen Model: 2272
Pulse Gen Serial Number: 9150511

## 2020-03-20 ENCOUNTER — Encounter: Payer: Self-pay | Admitting: Cardiology

## 2020-03-20 ENCOUNTER — Ambulatory Visit (INDEPENDENT_AMBULATORY_CARE_PROVIDER_SITE_OTHER): Payer: Medicaid Other | Admitting: Cardiology

## 2020-03-20 ENCOUNTER — Other Ambulatory Visit: Payer: Self-pay

## 2020-03-20 VITALS — BP 132/84 | HR 60 | Ht 65.0 in | Wt 293.0 lb

## 2020-03-20 DIAGNOSIS — I495 Sick sinus syndrome: Secondary | ICD-10-CM | POA: Diagnosis not present

## 2020-03-20 NOTE — Patient Instructions (Signed)
Medication Instructions:  Your physician recommends that you continue on your current medications as directed. Please refer to the Current Medication list given to you today.  *If you need a refill on your cardiac medications before your next appointment, please call your pharmacy*   Lab Work: None ordered   Testing/Procedures: None ordered   Follow-Up: At [facility], you and your health needs are our priority.  As part of our continuing mission to provide you with exceptional heart care, we have created designated Provider Care Teams.  These Care Teams include your primary Cardiologist (physician) and Advanced Practice Providers (APPs -  Physician Assistants and Nurse Practitioners) who all work together to provide you with the care you need, when you need it.  Remote monitoring is used to monitor your Pacemaker or ICD from home. This monitoring reduces the number of office visits required to check your device to one time per year. It allows us to keep an eye on the functioning of your device to ensure it is working properly. You are scheduled for a device check from home on 06/17/2020. You may send your transmission at any time that day. If you have a wireless device, the transmission will be sent automatically. After your physician reviews your transmission, you will receive a postcard with your next transmission date.  Your next appointment:   1 year(s)  The format for your next appointment:   In Person  Provider:   Will Camnitz, MD   Thank you for choosing [facility]!!   Raguel Kosloski, RN [phone]      

## 2020-03-20 NOTE — Progress Notes (Signed)
Electrophysiology Office Note   Date:  03/20/2020   ID:  SHIARA MCGOUGH, DOB [birth year], MRN [id number]  PCP:  [name], MD  Cardiologist:  [name] Primary Electrophysiologist:  Emilina Smarr Jorja Loa, MD    No chief complaint on file.    History of Present Illness: Melanie Hess is a 49 y.o. female who is being seen today for the evaluation of near syncope at the request of [name], MD. Presenting today for electrophysiology evaluation.    She has a history significant for hypertension, diabetes, OSA on CPAP, morbid obesity status post gastric sleeve.  She had multiple episodes of syncope described as lightheadedness and room spinning.  She felt hot prior to her episodes of passing out.  One of her episodes was at [employer] while working.  She went to the emergency room was found to be bradycardic with heart rates in the 40s.  She then presented emergency room 08/31/2018 after an episode of chest pain and syncope the day before.  Her work-up was negative.  She is bradycardic without symptoms of time.  Due to her sick sinus syndrome she is status post [facility] Jude dual-chamber pacemaker implanted 09/07/2018  Today, denies symptoms of palpitations, chest pain, shortness of breath, orthopnea, PND, lower extremity edema, claudication, dizziness, presyncope, syncope, bleeding, or neurologic sequela. The patient is tolerating medications without difficulties.  Overall she feels well.  Over the last few weeks, she has been getting more fatigue.  She is somewhat compliant wearing her CPAP.  She works 2 jobs and it sounds that she only gets possibly 5 hours of sleep a night.  She certainly thinks that this is because of her symptoms.  Past Medical History:  Diagnosis Date  . Ankle fracture, right 2001  . Anxiety   . Arthritis   . Asthma   . Carpal tunnel syndrome    Bilateral  . Diabetes mellitus without complication (HCC)    Type II  . GERD (gastroesophageal reflux disease)    . Hernia, abdominal   . Hypertension   . Migraine   . OSA (obstructive sleep apnea)   . Plantar fasciitis of right foot   . Pneumonia    hx  . Shingles 11/2015  . Shortness of breath dyspnea    with walking short distances   Past Surgical History:  Procedure Laterality Date  . CARPAL TUNNEL RELEASE Left   . GASTRIC BYPASS    . HEEL SPUR RESECTION Right 08/01/2015   Procedure: HEEL SPUR EXCISIONS;  Surgeon: Marcene Corning, MD;  [facility];  Service: Orthopedics;  Laterality: Right;  . HEEL SPUR RESECTION Left 01/27/2016   Procedure: LEFT HEEL SPUR EXCISION AND TENDON ACHILLIES REPAIR;  Surgeon: Marcene Corning, MD;  Location: [facility];  Service: Orthopedics;  Laterality: Left;  PRONE POSITION  . LAPAROSCOPIC GASTRIC SLEEVE RESECTION    . PACEMAKER IMPLANT N/A 09/11/2018   Procedure: PACEMAKER IMPLANT;  Surgeon: Regan Lemming, MD;  Location: [facility];  Service: Cardiovascular;  Laterality: N/A;  . TUBAL LIGATION       Current Outpatient Medications  Medication Sig Dispense Refill  . BREO ELLIPTA 200-25 MCG/INH AEPB INHALE 1 PUFF INTO LUNGS DAILY (Patient taking differently: Inhale 1 puff into the lungs daily.) 60 each 6  . cetirizine (ZYRTEC) 10 MG tablet Take 10 mg by mouth daily.    . clonazePAM (KLONOPIN) 2 MG tablet Take 2 mg by mouth daily as needed for anxiety.     Marland Kitchen  Cyanocobalamin (VITAMIN B-12 PO) Take 1 tablet by mouth at bedtime.    . cyclobenzaprine (FLEXERIL) 10 MG tablet Take 1 tablet (10 mg total) by mouth 2 (two) times daily as needed for muscle spasms. 20 tablet 0  . dicyclomine (BENTYL) 10 MG capsule Take 10 mg by mouth daily with lunch.     . Galcanezumab-gnlm 120 MG/ML SOAJ Inject 120 mLs into the skin every 30 (thirty) days. For migraine prevention    . IRON PO Take 1 tablet by mouth at bedtime.    Marland Kitchen losartan (COZAAR) 100 MG tablet Take 1 tablet (100 mg total) by mouth daily. 30 tablet 6  . methocarbamol (ROBAXIN) 500 MG tablet Take 1 tablet (500  mg total) by mouth 2 (two) times daily. 20 tablet 0  . nystatin (MYCOSTATIN/NYSTOP) powder Apply topically 4 (four) times daily. (Patient taking differently: Apply 1 application topically 2 (two) times daily.) 15 g 0  . omeprazole (PRILOSEC) 20 MG capsule Take 1 capsule (20 mg total) by mouth 2 (two) times daily before a meal. 60 capsule 0  . pregabalin (LYRICA) 100 MG capsule Take 100 mg by mouth 2 (two) times daily.    Marland Kitchen tiZANidine (ZANAFLEX) 2 MG tablet Take 2 mg by mouth every 8 (eight) hours as needed for muscle spasms.    . ursodiol (ACTIGALL) 250 MG tablet Take 250 mg by mouth at bedtime as needed (stomach pain).     Marland Kitchen VICTOZA 18 MG/3ML SOPN Inject 1.8 mg into the skin at bedtime.      No current facility-administered medications for this visit.    Allergies:   Adhesive [tape], Aspirin, Nsaids, Penicillins, Tolmetin, Vancomycin, Oxycodone-acetaminophen, Vicodin [hydrocodone-acetaminophen], and Tramadol   Social History:  The patient  reports that she has never smoked. She has never used smokeless tobacco. She reports that she does not drink alcohol and does not use drugs.   Family History:  The patient's family history includes Brain cancer in her cousin; Breast cancer in her maternal aunt; CAD in her maternal grandmother; Depression in her mother; Heart attack in her maternal grandmother; Hypertension in her mother; Microcephaly in her maternal grandmother; Pulmonary embolism in her mother.   ROS:  Please see the history of present illness.   Otherwise, review of systems is positive for none.   All other systems are reviewed and negative.   PHYSICAL EXAM: VS:  BP 132/84   Pulse 60   Ht 5\' 5"  (1.651 m)   Wt 293 lb (132.9 kg)   LMP 02/13/2017   SpO2 99%   BMI 48.76 kg/m  , BMI Body mass index is 48.76 kg/m. GEN: Well nourished, well developed, in no acute distress  HEENT: normal  Neck: no JVD, carotid bruits, or masses Cardiac: RRR; no murmurs, rubs, or gallops,no edema   Respiratory:  clear to auscultation bilaterally, normal work of breathing GI: soft, nontender, nondistended, + BS MS: no deformity or atrophy  Skin: warm and dry, device site well healed Neuro:  Strength and sensation are intact Psych: euthymic mood, full affect  EKG:  EKG is ordered today. Personal review of the ekg ordered shows atrial paced, rate 60  Personal review of the device interrogation today. Results in Paceart   Recent Labs: No results found for requested labs within last 8760 hours.    Lipid Panel  No results found for: CHOL, TRIG, HDL, CHOLHDL, VLDL, LDLCALC, LDLDIRECT   Wt Readings from Last 3 Encounters:  03/20/20 293 lb (132.9 kg)  12/19/18 266  lb (120.7 kg)  10/26/18 273 lb (123.8 kg)      Other studies Reviewed: Additional studies/ records that were reviewed today include: TTE 02/14/18  Review of the above records today demonstrates:   1. The left ventricle appears to be normal in size, has normal wall thickness 60-65% ejection fraction Spectral Doppler shows indeterminate pattern of diastolic filling.  2. Right ventricular systolic pressure is could not be assessed.  3. The right ventricle has normal size and normal systolic function.  4. Normal left atrial size.  5. Normal right atrial size.  6. The mitral valve normal in structure and function.  7. Normal tricuspid valve.  8. Aortic valve normal.  9. Pulmonic valve regurgitation is mild by color flow Doppler. 10. The inferior vena cava was dilated in size with >50% respiratory variablity. 11. No atrial level shunt detected by color flow Doppler.    ASSESSMENT AND PLAN:  1.  Sick sinus syndrome: Status post [employer] dual-chamber pacemaker implanted 09/11/2018.  Device functioning appropriately.  No changes at this time.    2.  Obstructive sleep apnea: CPAP compliance encouraged  3.  Hypertension: Currently well controlled   Current medicines are reviewed at length with the patient today.    The patient does not have concerns regarding her medicines.  The following changes were made today: none  Labs/ tests ordered today include:  Orders Placed This Encounter  Procedures  . EKG 12-Lead     Disposition:   FU with Ananda Caya 12 months  Signed, Sreekar Broyhill Jorja Loa, MD  03/20/2020 3:51 PM     [facility] [address] [place] [address] [phone] (office) [phone] (fax)

## 2020-03-26 NOTE — Progress Notes (Signed)
Remote pacemaker transmission.   

## 2020-05-27 ENCOUNTER — Other Ambulatory Visit: Payer: Self-pay | Admitting: Cardiology

## 2020-06-17 ENCOUNTER — Ambulatory Visit (INDEPENDENT_AMBULATORY_CARE_PROVIDER_SITE_OTHER): Payer: Medicaid Other

## 2020-06-17 DIAGNOSIS — I495 Sick sinus syndrome: Secondary | ICD-10-CM

## 2020-06-18 LAB — CUP PACEART REMOTE DEVICE CHECK
Battery Remaining Longevity: 115 mo
Battery Remaining Percentage: 95.5 %
Battery Voltage: 3.01 V
Brady Statistic AP VP Percent: 2.4 %
Brady Statistic AP VS Percent: 58 %
Brady Statistic AS VP Percent: 1 %
Brady Statistic AS VS Percent: 39 %
Brady Statistic RA Percent Paced: 59 %
Brady Statistic RV Percent Paced: 2.6 %
Date Time Interrogation Session: 20220531204826
Implantable Lead Implant Date: 20200824
Implantable Lead Implant Date: 20200824
Implantable Lead Location: 753859
Implantable Lead Location: 753860
Implantable Pulse Generator Implant Date: 20200824
Lead Channel Impedance Value: 430 Ohm
Lead Channel Impedance Value: 510 Ohm
Lead Channel Pacing Threshold Amplitude: 0.5 V
Lead Channel Pacing Threshold Amplitude: 1 V
Lead Channel Pacing Threshold Pulse Width: 0.5 ms
Lead Channel Pacing Threshold Pulse Width: 0.5 ms
Lead Channel Sensing Intrinsic Amplitude: 3.9 mV
Lead Channel Sensing Intrinsic Amplitude: 5 mV
Lead Channel Setting Pacing Amplitude: 2 V
Lead Channel Setting Pacing Amplitude: 2.5 V
Lead Channel Setting Pacing Pulse Width: 0.5 ms
Lead Channel Setting Sensing Sensitivity: 0.7 mV
Pulse Gen Model: 2272
Pulse Gen Serial Number: 9150511

## 2020-07-09 NOTE — Progress Notes (Signed)
Remote pacemaker transmission.   

## 2020-08-05 ENCOUNTER — Encounter (HOSPITAL_COMMUNITY): Payer: Self-pay

## 2020-08-05 ENCOUNTER — Other Ambulatory Visit: Payer: Self-pay

## 2020-08-05 ENCOUNTER — Emergency Department (HOSPITAL_COMMUNITY)
Admission: EM | Admit: 2020-08-05 | Discharge: 2020-08-05 | Disposition: A | Discharge status: Home / Self Care | Payer: Medicaid Other | Attending: Emergency Medicine | Admitting: Emergency Medicine

## 2020-08-05 DIAGNOSIS — E119 Type 2 diabetes mellitus without complications: Secondary | ICD-10-CM | POA: Diagnosis not present

## 2020-08-05 DIAGNOSIS — Z79899 Other long term (current) drug therapy: Secondary | ICD-10-CM | POA: Diagnosis not present

## 2020-08-05 DIAGNOSIS — Z950 Presence of cardiac pacemaker: Secondary | ICD-10-CM | POA: Diagnosis not present

## 2020-08-05 DIAGNOSIS — I10 Essential (primary) hypertension: Secondary | ICD-10-CM | POA: Diagnosis not present

## 2020-08-05 DIAGNOSIS — J4540 Moderate persistent asthma, uncomplicated: Secondary | ICD-10-CM | POA: Diagnosis not present

## 2020-08-05 DIAGNOSIS — M542 Cervicalgia: Secondary | ICD-10-CM | POA: Insufficient documentation

## 2020-08-05 MED ORDER — DICLOFENAC SODIUM 1 % EX GEL: 4.0000 g | Freq: Four times a day (QID) | CUTANEOUS | 0 refills | Status: AC

## 2020-08-05 MED ORDER — METHOCARBAMOL 500 MG PO TABS: 500.0000 mg | ORAL_TABLET | Freq: Two times a day (BID) | ORAL | 0 refills | Status: AC

## 2020-08-05 NOTE — ED Triage Notes (Signed)
Pt arrived via walk in, c/o neck pain, states she has "knot" in muscle that is causing neck pain with mvmt. No injury.

## 2020-08-05 NOTE — ED Notes (Signed)
PA notified of BP. Patient is fine to discharge.

## 2020-08-05 NOTE — Discharge Instructions (Addendum)
Please read the attached information.  Please follow-up with your primary care provider.  Physical therapy can be very helpful for this disorder.  Please drink plenty of water.  Use Voltaren gel as prescribed.  Use the muscle relaxers as prescribed as well.  Tylenol 1000 mg every 6 hours as well.

## 2020-08-05 NOTE — ED Provider Notes (Addendum)
[facility]-EMERGENCY DEPT Provider Note   CSN: [id number] Arrival date & time: 08/05/20  1410     History Chief Complaint  Patient presents with   Neck Injury    Melanie Hess is a 50 y.o. female.  HPI Patient is 50 year old female with past medical history detailed below presented today with 3 days of nontraumatic left-sided neck pain.  She denies any falls or injuries.  Denies any numbness or weakness in any extremities.  She states that she felt that she slept funny the night before the symptoms began.  Denies any chest pain or shortness of breath.  No lightheadedness or dizziness.  No other associate symptoms.  Worse with attempting to move her neck.  No mitigating factors.  She has not taken any medicine.    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   Morbid obesity with BMI of 50.0-59.9, adult (HCC) 08/09/2017   Esophageal reflux 03/10/2017   Preoperative clearance 08/12/2016   Chest pain 04/07/2016   LVH (left ventricular hypertrophy) 04/07/2016   Hepatic steatosis 04/07/2016   Diabetes mellitus with complication (HCC)    Moderate persistent asthma 12/09/2015   OSA on CPAP 10/23/2015   Pain of right heel 08/01/2015   Chronic pain    Anemia 03/24/2014   Diabetes mellitus type 2, controlled, without complications (HCC) 03/24/2014    Asthma exacerbation 03/24/2014   Hypertension 02/28/2014   Morbid obesity (HCC) 02/28/2014   DOE (dyspnea on exertion) 02/28/2014   Dyspnea and respiratory abnormality 02/14/2014    Past Surgical History:  Procedure Laterality Date   CARPAL TUNNEL RELEASE Left    GASTRIC BYPASS     HEEL SPUR RESECTION Right 08/01/2015   Procedure: HEEL SPUR EXCISIONS;  Surgeon: Marcene Corning, MD;  Location: [facility] OR;  Service: Orthopedics;  Laterality: Right;   HEEL SPUR RESECTION Left 01/27/2016   Procedure: LEFT HEEL SPUR EXCISION AND TENDON ACHILLIES REPAIR;  Surgeon: Marcene Corning, MD;  Location: [facility] OR;  Service: Orthopedics;  Laterality: Left;  PRONE POSITION   LAPAROSCOPIC GASTRIC SLEEVE RESECTION     PACEMAKER IMPLANT N/A 09/11/2018   Procedure: PACEMAKER IMPLANT;  Surgeon: Regan Lemming, MD;  Location: [facility] INVASIVE CV LAB;  Service: Cardiovascular;  Laterality: N/A;   TUBAL LIGATION       OB History   No obstetric history on file.     Family History  Problem Relation Age of Onset   Hypertension Mother    Pulmonary embolism Mother    Depression Mother    Breast cancer Maternal Aunt    Brain cancer Cousin    CAD Maternal Grandmother    Microcephaly Maternal Grandmother    Heart attack Maternal Grandmother     Social History  Tobacco Use   Smoking status: Never   Smokeless tobacco: Never   Tobacco comments:    years ago may have smoked 1 cig or less a week  Vaping Use   Vaping Use: Never used  Substance Use Topics   Alcohol use: No    Alcohol/week: 0.0 standard drinks   Drug use: No    Home Medications Prior to Admission medications   Medication Sig Start Date End Date Taking? Authorizing Provider  diclofenac Sodium (VOLTAREN) 1 % GEL Apply 4 g topically 4 (four) times daily. 08/05/20  Yes Shironda Kain S, PA  methocarbamol (ROBAXIN) 500 MG tablet Take 1 tablet (500 mg total) by mouth 2 (two) times daily for 20 days. 08/05/20 08/25/20 Yes Jalayna Josten S, PA  BREO  ELLIPTA 200-25 MCG/INH AEPB INHALE 1 PUFF INTO LUNGS DAILY Patient taking differently: Inhale 1 puff into the lungs daily. 03/30/16   Kalman Shan, MD  cetirizine (ZYRTEC) 10 MG tablet Take 10 mg by mouth daily.    [provider]  clonazePAM (KLONOPIN) 2 MG tablet Take 2 mg by mouth daily as needed for anxiety.  11/21/18   [provider]  Cyanocobalamin (VITAMIN B-12 PO) Take 1 tablet by mouth at bedtime.    [provider]  cyclobenzaprine (FLEXERIL) 10 MG tablet Take 1 tablet (10 mg total) by mouth 2 (two) times daily as needed for muscle spasms. 04/16/19   Myra Rude, MD  dicyclomine (BENTYL) 10 MG capsule Take 10 mg by mouth daily with lunch.     [provider]  Galcanezumab-gnlm 120 MG/ML SOAJ Inject 120 mLs into the skin every 30 (thirty) days. For migraine prevention    [provider]  IRON PO Take 1 tablet by mouth at bedtime.    [provider]  losartan (COZAAR) 100 MG tablet TAKE 1 TABLET BY MOUTH EVERY DAY 05/28/20   Camnitz, Andree Coss, MD  nystatin (MYCOSTATIN/NYSTOP) powder Apply topically 4 (four) times daily. Patient taking differently: Apply 1 application topically 2 (two) times daily. 08/31/18   Sabas Sous, MD  omeprazole (PRILOSEC) 20 MG capsule Take 1 capsule (20 mg total) by mouth 2 (two) times daily before a meal. 02/14/18   Clydie Braun, MD  pregabalin (LYRICA) 100 MG capsule Take 100 mg by mouth 2 (two) times daily. 11/21/18   [provider]  tiZANidine (ZANAFLEX) 2 MG tablet Take 2 mg by mouth every 8 (eight) hours as needed for muscle spasms. 10/19/18   [provider]  ursodiol (ACTIGALL) 250 MG tablet Take 250 mg by mouth at bedtime as needed (stomach pain).  01/08/18   [provider]  VICTOZA 18 MG/3ML SOPN Inject 1.8 mg into the skin at bedtime.  05/22/18   [provider]    Allergies    Adhesive [tape], Aspirin, Nsaids, Penicillins, Tolmetin, Vancomycin,  Oxycodone-acetaminophen, Vicodin [hydrocodone-acetaminophen], and Tramadol  Review of Systems   Review of Systems  Constitutional:  Negative for chills and fever.  HENT:  Negative for congestion.   Eyes:  Negative for pain.  Respiratory:  Negative for cough and shortness of breath.   Cardiovascular:  Negative for chest pain and leg swelling.  Gastrointestinal:  Negative for abdominal pain and vomiting.  Genitourinary:  Negative for dysuria.  Musculoskeletal:  Positive for neck pain. Negative for myalgias.  Skin:  Negative for rash.  Neurological:  Negative for dizziness and headaches.   Physical Exam Updated Vital Signs BP (!) 148/106 (BP Location: Right Arm)  Pulse 75   Temp 99.8 F (37.7 C) (Oral)   Resp 18   LMP 02/13/2017   SpO2 100%   Physical Exam Vitals and nursing note reviewed.  Constitutional:      General: She is not in acute distress. HENT:     Head: Normocephalic and atraumatic.     Nose: Nose normal.  Eyes:     General: No scleral icterus. Neck:     Comments: Restricted range of motion of the cervical spine due to discomfort.  There is palpable spasm on the left side of the cervical vertebra with multiple trigger points that extended to the trapezius on the left side. Cardiovascular:     Rate and Rhythm: Normal rate and regular rhythm.     Pulses: Normal pulses.     Heart sounds: Normal heart sounds.  Pulmonary:     Effort: Pulmonary effort is normal. No respiratory distress.     Breath sounds: No wheezing.  Abdominal:     Palpations: Abdomen is soft.     Tenderness: There is no abdominal tenderness.  Musculoskeletal:     Right lower leg: No edema.     Left lower leg: No edema.  Skin:    General: Skin is warm and dry.     Capillary Refill: Capillary refill takes less than 2 seconds.  Neurological:     Mental Status: She is alert. Mental status is at baseline.  Psychiatric:        Mood and Affect: Mood normal.        Behavior: Behavior normal.     ED Results / Procedures / Treatments   Labs (all labs ordered are listed, but only abnormal results are displayed) Labs Reviewed - No data to display  EKG None  Radiology No results found.  Procedures Procedures   Medications Ordered in ED Medications - No data to display  ED Course  I have reviewed the triage vital signs and the nursing notes.  Pertinent labs & imaging results that were available during my care of the patient were reviewed by me and considered in my medical decision making (see chart for details).    MDM Rules/Calculators/A&P                           Patient with history and physical exam consistent with torticollis.  Patient counseled on her elevated blood pressure she states that she will follow-up with her PCP about this.  Denies any other symptoms today in no symptoms consistent with hypertensive urgency/emergency.  Given that this issue is atraumatic and low suspicion for cervical fracture.  She has no abscess or abnormal superficial anatomy concerning for infection.  No warmth or cellulitis.  She does have a somewhat fatty buffalo hump appearance of the back of her neck.  She states that this is unchanged from prior.  We will treat conservatively with Tylenol, muscle relaxer, Voltaren gel patient follow-up with PCP.  Return precautions given.  Final Clinical Impression(s) / ED Diagnoses Final diagnoses:  Neck pain    Rx / DC Orders ED Discharge Orders          Ordered    methocarbamol (ROBAXIN) 500 MG tablet  2 times daily        08/05/20 1737    diclofenac Sodium (VOLTAREN) 1 % GEL  4 times daily        08/05/20 1737  Gailen ShelterFondaw, Careena Degraffenreid S, [place] 08/05/20 1741    Gailen ShelterFondaw, Refujio Haymer S, [place] 08/05/20 1849    Lorre NickAllen, Anthony, MD 08/06/20 1535

## 2020-09-16 ENCOUNTER — Ambulatory Visit (INDEPENDENT_AMBULATORY_CARE_PROVIDER_SITE_OTHER): Payer: Medicaid Other

## 2020-09-16 DIAGNOSIS — I495 Sick sinus syndrome: Secondary | ICD-10-CM

## 2020-09-16 LAB — CUP PACEART REMOTE DEVICE CHECK
Battery Remaining Longevity: 94 mo
Battery Remaining Percentage: 84 %
Battery Voltage: 3.01 V
Brady Statistic AP VP Percent: 2.3 %
Brady Statistic AP VS Percent: 54 %
Brady Statistic AS VP Percent: 1 %
Brady Statistic AS VS Percent: 42 %
Brady Statistic RA Percent Paced: 56 %
Brady Statistic RV Percent Paced: 2.5 %
Date Time Interrogation Session: 20220830040019
Implantable Lead Implant Date: 20200824
Implantable Lead Implant Date: 20200824
Implantable Lead Location: 753859
Implantable Lead Location: 753860
Implantable Pulse Generator Implant Date: 20200824
Lead Channel Impedance Value: 430 Ohm
Lead Channel Impedance Value: 460 Ohm
Lead Channel Pacing Threshold Amplitude: 0.5 V
Lead Channel Pacing Threshold Amplitude: 1 V
Lead Channel Pacing Threshold Pulse Width: 0.5 ms
Lead Channel Pacing Threshold Pulse Width: 0.5 ms
Lead Channel Sensing Intrinsic Amplitude: 3.1 mV
Lead Channel Sensing Intrinsic Amplitude: 4.7 mV
Lead Channel Setting Pacing Amplitude: 2 V
Lead Channel Setting Pacing Amplitude: 2.5 V
Lead Channel Setting Pacing Pulse Width: 0.5 ms
Lead Channel Setting Sensing Sensitivity: 0.7 mV
Pulse Gen Model: 2272
Pulse Gen Serial Number: 9150511

## 2020-09-29 NOTE — Progress Notes (Signed)
Remote pacemaker transmission.   

## 2020-12-16 ENCOUNTER — Ambulatory Visit (INDEPENDENT_AMBULATORY_CARE_PROVIDER_SITE_OTHER): Payer: Medicaid Other

## 2020-12-16 DIAGNOSIS — I495 Sick sinus syndrome: Secondary | ICD-10-CM | POA: Diagnosis not present

## 2020-12-16 LAB — CUP PACEART REMOTE DEVICE CHECK
Battery Remaining Longevity: 92 mo
Battery Remaining Percentage: 82 %
Battery Voltage: 3.01 V
Brady Statistic AP VP Percent: 2.2 %
Brady Statistic AP VS Percent: 52 %
Brady Statistic AS VP Percent: 1 %
Brady Statistic AS VS Percent: 45 %
Brady Statistic RA Percent Paced: 53 %
Brady Statistic RV Percent Paced: 2.5 %
Date Time Interrogation Session: 20221129071212
Implantable Lead Implant Date: 20200824
Implantable Lead Implant Date: 20200824
Implantable Lead Location: 753859
Implantable Lead Location: 753860
Implantable Pulse Generator Implant Date: 20200824
Lead Channel Impedance Value: 430 Ohm
Lead Channel Impedance Value: 510 Ohm
Lead Channel Pacing Threshold Amplitude: 0.5 V
Lead Channel Pacing Threshold Amplitude: 1 V
Lead Channel Pacing Threshold Pulse Width: 0.5 ms
Lead Channel Pacing Threshold Pulse Width: 0.5 ms
Lead Channel Sensing Intrinsic Amplitude: 3.6 mV
Lead Channel Sensing Intrinsic Amplitude: 4.9 mV
Lead Channel Setting Pacing Amplitude: 2 V
Lead Channel Setting Pacing Amplitude: 2.5 V
Lead Channel Setting Pacing Pulse Width: 0.5 ms
Lead Channel Setting Sensing Sensitivity: 0.7 mV
Pulse Gen Model: 2272
Pulse Gen Serial Number: 9150511

## 2020-12-25 NOTE — Progress Notes (Signed)
Remote pacemaker transmission.   

## 2021-03-17 ENCOUNTER — Ambulatory Visit (INDEPENDENT_AMBULATORY_CARE_PROVIDER_SITE_OTHER): Payer: Medicaid Other

## 2021-03-17 DIAGNOSIS — I495 Sick sinus syndrome: Secondary | ICD-10-CM

## 2021-03-17 LAB — CUP PACEART REMOTE DEVICE CHECK
Battery Remaining Longevity: 90 mo
Battery Remaining Percentage: 80 %
Battery Voltage: 3.01 V
Brady Statistic AP VP Percent: 2.2 %
Brady Statistic AP VS Percent: 50 %
Brady Statistic AS VP Percent: 1 %
Brady Statistic AS VS Percent: 47 %
Brady Statistic RA Percent Paced: 51 %
Brady Statistic RV Percent Paced: 2.5 %
Date Time Interrogation Session: 20230228040015
Implantable Lead Implant Date: 20200824
Implantable Lead Implant Date: 20200824
Implantable Lead Location: 753859
Implantable Lead Location: 753860
Implantable Pulse Generator Implant Date: 20200824
Lead Channel Impedance Value: 430 Ohm
Lead Channel Impedance Value: 490 Ohm
Lead Channel Pacing Threshold Amplitude: 0.5 V
Lead Channel Pacing Threshold Amplitude: 1 V
Lead Channel Pacing Threshold Pulse Width: 0.5 ms
Lead Channel Pacing Threshold Pulse Width: 0.5 ms
Lead Channel Sensing Intrinsic Amplitude: 3.6 mV
Lead Channel Sensing Intrinsic Amplitude: 4.5 mV
Lead Channel Setting Pacing Amplitude: 2 V
Lead Channel Setting Pacing Amplitude: 2.5 V
Lead Channel Setting Pacing Pulse Width: 0.5 ms
Lead Channel Setting Sensing Sensitivity: 0.7 mV
Pulse Gen Model: 2272
Pulse Gen Serial Number: 9150511

## 2021-03-24 NOTE — Progress Notes (Signed)
Remote pacemaker transmission.   

## 2021-06-16 ENCOUNTER — Ambulatory Visit (INDEPENDENT_AMBULATORY_CARE_PROVIDER_SITE_OTHER): Payer: Medicaid Other

## 2021-06-16 DIAGNOSIS — I495 Sick sinus syndrome: Secondary | ICD-10-CM

## 2021-06-16 LAB — CUP PACEART REMOTE DEVICE CHECK
Battery Remaining Longevity: 88 mo
Battery Remaining Percentage: 77 %
Battery Voltage: 3.01 V
Brady Statistic AP VP Percent: 2.3 %
Brady Statistic AP VS Percent: 50 %
Brady Statistic AS VP Percent: 1 %
Brady Statistic AS VS Percent: 47 %
Brady Statistic RA Percent Paced: 51 %
Brady Statistic RV Percent Paced: 2.6 %
Date Time Interrogation Session: 20230530040014
Implantable Lead Implant Date: 20200824
Implantable Lead Implant Date: 20200824
Implantable Lead Location: 753859
Implantable Lead Location: 753860
Implantable Pulse Generator Implant Date: 20200824
Lead Channel Impedance Value: 430 Ohm
Lead Channel Impedance Value: 480 Ohm
Lead Channel Pacing Threshold Amplitude: 0.5 V
Lead Channel Pacing Threshold Amplitude: 1 V
Lead Channel Pacing Threshold Pulse Width: 0.5 ms
Lead Channel Pacing Threshold Pulse Width: 0.5 ms
Lead Channel Sensing Intrinsic Amplitude: 3.1 mV
Lead Channel Sensing Intrinsic Amplitude: 4.8 mV
Lead Channel Setting Pacing Amplitude: 2 V
Lead Channel Setting Pacing Amplitude: 2.5 V
Lead Channel Setting Pacing Pulse Width: 0.5 ms
Lead Channel Setting Sensing Sensitivity: 0.7 mV
Pulse Gen Model: 2272
Pulse Gen Serial Number: 9150511

## 2021-07-02 NOTE — Progress Notes (Signed)
Remote pacemaker transmission.   

## 2021-09-15 ENCOUNTER — Ambulatory Visit (INDEPENDENT_AMBULATORY_CARE_PROVIDER_SITE_OTHER): Payer: Medicaid Other

## 2021-09-15 DIAGNOSIS — I495 Sick sinus syndrome: Secondary | ICD-10-CM

## 2021-09-16 LAB — CUP PACEART REMOTE DEVICE CHECK
Battery Remaining Longevity: 85 mo
Battery Remaining Percentage: 75 %
Battery Voltage: 3.01 V
Brady Statistic AP VP Percent: 2.4 %
Brady Statistic AP VS Percent: 49 %
Brady Statistic AS VP Percent: 1 %
Brady Statistic AS VS Percent: 47 %
Brady Statistic RA Percent Paced: 51 %
Brady Statistic RV Percent Paced: 2.7 %
Date Time Interrogation Session: 20230829201301
Implantable Lead Implant Date: 20200824
Implantable Lead Implant Date: 20200824
Implantable Lead Location: 753859
Implantable Lead Location: 753860
Implantable Pulse Generator Implant Date: 20200824
Lead Channel Impedance Value: 410 Ohm
Lead Channel Impedance Value: 510 Ohm
Lead Channel Pacing Threshold Amplitude: 0.5 V
Lead Channel Pacing Threshold Amplitude: 1 V
Lead Channel Pacing Threshold Pulse Width: 0.5 ms
Lead Channel Pacing Threshold Pulse Width: 0.5 ms
Lead Channel Sensing Intrinsic Amplitude: 3 mV
Lead Channel Sensing Intrinsic Amplitude: 5 mV
Lead Channel Setting Pacing Amplitude: 2 V
Lead Channel Setting Pacing Amplitude: 2.5 V
Lead Channel Setting Pacing Pulse Width: 0.5 ms
Lead Channel Setting Sensing Sensitivity: 0.7 mV
Pulse Gen Model: 2272
Pulse Gen Serial Number: 9150511

## 2021-10-09 NOTE — Progress Notes (Signed)
Remote pacemaker transmission.   

## 2021-11-07 ENCOUNTER — Other Ambulatory Visit: Payer: Self-pay | Admitting: Cardiology

## 2021-11-09 ENCOUNTER — Other Ambulatory Visit: Payer: Self-pay

## 2021-11-09 MED ORDER — LOSARTAN POTASSIUM 100 MG PO TABS: 100.0000 mg | ORAL_TABLET | Freq: Every day | ORAL | 1 refills | Status: DC

## 2021-12-15 ENCOUNTER — Ambulatory Visit (INDEPENDENT_AMBULATORY_CARE_PROVIDER_SITE_OTHER): Payer: Medicaid Other

## 2021-12-15 DIAGNOSIS — I495 Sick sinus syndrome: Secondary | ICD-10-CM

## 2021-12-15 DIAGNOSIS — Z950 Presence of cardiac pacemaker: Secondary | ICD-10-CM

## 2021-12-16 LAB — CUP PACEART REMOTE DEVICE CHECK
Battery Remaining Longevity: 82 mo
Battery Remaining Percentage: 73 %
Battery Voltage: 3.01 V
Brady Statistic AP VP Percent: 2.4 %
Brady Statistic AP VS Percent: 51 %
Brady Statistic AS VP Percent: 1 %
Brady Statistic AS VS Percent: 46 %
Brady Statistic RA Percent Paced: 53 %
Brady Statistic RV Percent Paced: 2.7 %
Date Time Interrogation Session: 20231128195714
Implantable Lead Connection Status: 753985
Implantable Lead Connection Status: 753985
Implantable Lead Implant Date: 20200824
Implantable Lead Implant Date: 20200824
Implantable Lead Location: 753859
Implantable Lead Location: 753860
Implantable Pulse Generator Implant Date: 20200824
Lead Channel Impedance Value: 410 Ohm
Lead Channel Impedance Value: 510 Ohm
Lead Channel Pacing Threshold Amplitude: 0.5 V
Lead Channel Pacing Threshold Amplitude: 1 V
Lead Channel Pacing Threshold Pulse Width: 0.5 ms
Lead Channel Pacing Threshold Pulse Width: 0.5 ms
Lead Channel Sensing Intrinsic Amplitude: 2.3 mV
Lead Channel Sensing Intrinsic Amplitude: 3.5 mV
Lead Channel Setting Pacing Amplitude: 2 V
Lead Channel Setting Pacing Amplitude: 2.5 V
Lead Channel Setting Pacing Pulse Width: 0.5 ms
Lead Channel Setting Sensing Sensitivity: 0.7 mV
Pulse Gen Model: 2272
Pulse Gen Serial Number: 9150511

## 2022-01-13 NOTE — Progress Notes (Signed)
Remote pacemaker transmission.   

## 2022-01-27 ENCOUNTER — Other Ambulatory Visit: Payer: Self-pay

## 2022-01-27 ENCOUNTER — Emergency Department (HOSPITAL_COMMUNITY): Payer: Medicaid Other

## 2022-01-27 ENCOUNTER — Emergency Department (HOSPITAL_COMMUNITY)
Admission: EM | Admit: 2022-01-27 | Discharge: 2022-01-27 | Disposition: A | Discharge status: Home / Self Care | Payer: Medicaid Other | Attending: Emergency Medicine | Admitting: Emergency Medicine

## 2022-01-27 DIAGNOSIS — R0602 Shortness of breath: Secondary | ICD-10-CM | POA: Insufficient documentation

## 2022-01-27 DIAGNOSIS — I10 Essential (primary) hypertension: Secondary | ICD-10-CM | POA: Insufficient documentation

## 2022-01-27 DIAGNOSIS — R519 Headache, unspecified: Secondary | ICD-10-CM | POA: Insufficient documentation

## 2022-01-27 DIAGNOSIS — Z1152 Encounter for screening for COVID-19: Secondary | ICD-10-CM | POA: Insufficient documentation

## 2022-01-27 DIAGNOSIS — R072 Precordial pain: Secondary | ICD-10-CM | POA: Insufficient documentation

## 2022-01-27 DIAGNOSIS — E119 Type 2 diabetes mellitus without complications: Secondary | ICD-10-CM | POA: Insufficient documentation

## 2022-01-27 DIAGNOSIS — R42 Dizziness and giddiness: Secondary | ICD-10-CM | POA: Insufficient documentation

## 2022-01-27 DIAGNOSIS — R079 Chest pain, unspecified: Secondary | ICD-10-CM

## 2022-01-27 DIAGNOSIS — J45909 Unspecified asthma, uncomplicated: Secondary | ICD-10-CM | POA: Diagnosis not present

## 2022-01-27 DIAGNOSIS — Z7951 Long term (current) use of inhaled steroids: Secondary | ICD-10-CM | POA: Insufficient documentation

## 2022-01-27 DIAGNOSIS — Z79899 Other long term (current) drug therapy: Secondary | ICD-10-CM | POA: Diagnosis not present

## 2022-01-27 DIAGNOSIS — R197 Diarrhea, unspecified: Secondary | ICD-10-CM | POA: Diagnosis not present

## 2022-01-27 DIAGNOSIS — I2510 Atherosclerotic heart disease of native coronary artery without angina pectoris: Secondary | ICD-10-CM | POA: Insufficient documentation

## 2022-01-27 DIAGNOSIS — R1110 Vomiting, unspecified: Secondary | ICD-10-CM | POA: Insufficient documentation

## 2022-01-27 LAB — TROPONIN I (HIGH SENSITIVITY)
Troponin I (High Sensitivity): 4 ng/L (ref <18)
Troponin I (High Sensitivity): 6 ng/L (ref <18)

## 2022-01-27 LAB — BASIC METABOLIC PANEL
Anion gap: 12 (ref 5–15)
BUN: 11 mg/dL (ref 6–20)
CO2: 21 mmol/L — ABNORMAL LOW (ref 22–32)
Calcium: 8.5 mg/dL — ABNORMAL LOW (ref 8.9–10.3)
Chloride: 104 mmol/L (ref 98–111)
Creatinine, Ser: 1.25 mg/dL — ABNORMAL HIGH (ref 0.44–1.00)
GFR, Estimated: 52 mL/min — ABNORMAL LOW (ref >60)
Glucose, Bld: 104 mg/dL — ABNORMAL HIGH (ref 70–99)
Potassium: 3.4 mmol/L — ABNORMAL LOW (ref 3.5–5.1)
Sodium: 137 mmol/L (ref 135–145)

## 2022-01-27 LAB — RESP PANEL BY RT-PCR (RSV, FLU A&B, COVID)  RVPGX2
Influenza A by PCR: NEGATIVE
Influenza B by PCR: NEGATIVE
Resp Syncytial Virus by PCR: NEGATIVE
SARS Coronavirus 2 by RT PCR: NEGATIVE

## 2022-01-27 LAB — CBC
HCT: 38.7 % (ref 36.0–46.0)
Hemoglobin: 12.5 g/dL (ref 12.0–15.0)
MCH: 28.4 pg (ref 26.0–34.0)
MCHC: 32.3 g/dL (ref 30.0–36.0)
MCV: 88 fL (ref 80.0–100.0)
Platelets: 226 10*3/uL (ref 150–400)
RBC: 4.4 MIL/uL (ref 3.87–5.11)
RDW: 15.3 % (ref 11.5–15.5)
WBC: 3.8 10*3/uL — ABNORMAL LOW (ref 4.0–10.5)
nRBC: 0 % (ref 0.0–0.2)

## 2022-01-27 MED ORDER — DIPHENHYDRAMINE HCL 25 MG PO CAPS: 25.0000 mg | ORAL_CAPSULE | Freq: Once | ORAL | Status: DC

## 2022-01-27 MED ORDER — DIPHENHYDRAMINE HCL 50 MG/ML IJ SOLN
25.0000 mg | Freq: Once | INTRAMUSCULAR | Status: DC
  Filled 2022-01-27: qty 1

## 2022-01-27 MED ORDER — PROCHLORPERAZINE MALEATE 5 MG PO TABS: 10.0000 mg | ORAL_TABLET | Freq: Once | ORAL | Status: DC

## 2022-01-27 MED ORDER — SODIUM CHLORIDE 0.9 % IV BOLUS: 500.0000 mL | Freq: Once | INTRAVENOUS | Status: DC

## 2022-01-27 MED ORDER — PROCHLORPERAZINE MALEATE 10 MG PO TABS: 10.0000 mg | ORAL_TABLET | Freq: Two times a day (BID) | ORAL | 0 refills | Status: AC | PRN

## 2022-01-27 MED ORDER — DIPHENHYDRAMINE HCL 50 MG/ML IJ SOLN
25.0000 mg | Freq: Once | INTRAMUSCULAR | Status: AC
  Administered 2022-01-27: 25 mg via INTRAVENOUS

## 2022-01-27 MED ORDER — PROCHLORPERAZINE EDISYLATE 10 MG/2ML IJ SOLN
10.0000 mg | Freq: Once | INTRAMUSCULAR | Status: AC
  Administered 2022-01-27: 10 mg via INTRAVENOUS

## 2022-01-27 MED ORDER — PROCHLORPERAZINE EDISYLATE 10 MG/2ML IJ SOLN
10.0000 mg | Freq: Once | INTRAMUSCULAR | Status: DC
  Filled 2022-01-27: qty 2

## 2022-01-27 NOTE — ED Triage Notes (Addendum)
Pt. Stated, I started having chest pain with dizziness and a bad headache nthat started 2 days ago. Pt. Gave herself a breathing treatment 340. It helped some.

## 2022-01-27 NOTE — Discharge Instructions (Addendum)
Please be sure to call your physician to discuss today's emergency department evaluation, which has been generally reassuring.  If you do develop new, or concerning changes in your condition do not hesitate to return here.  You have been prescribed a new medication.  Please use this, in addition to Tylenol for headache relief.

## 2022-01-27 NOTE — ED Provider Notes (Signed)
[facility]ERGENCY DEPARTMENT Provider Note   CSN: [id number] Arrival date & time: 01/27/22  0711     History HTN, Asthma, DM,  Chief Complaint  Patient presents with   Chest Pain   Dizziness   Headache   Shortness of Breath   Asthma    Melanie Hess is a 52 y.o. female.  52 y.o female with a PMH of sick sinus syndrome presents to the ED with a chief dizziness and headache which begins 2 days ago. She is endorsing ongoing pressure which feels like stabbing sensation under the left breast. She states the pain is an intermittent pressure that is coming and going for the substernal area. She also endorses some nausea and vomiting, along with multiple episodes of diarrhea. Prior surgical intervention remarkable for gastric bypass. No alleviating factors aside from laying on her right side. No shortness of breath, no abdominal pain, no nausea or vomiting.   The history is provided by the patient and medical records.  Chest Pain Pain location:  L chest Pain quality: aching   Pain radiates to:  Does not radiate Pain severity:  Moderate Onset quality:  Gradual Duration:  2 days Timing:  Constant Progression:  Unchanged Chronicity:  New Context: breathing and lifting   Relieved by:  Nothing Worsened by:  Nothing Ineffective treatments:  None tried Associated symptoms: dizziness, headache, shortness of breath and vomiting   Associated symptoms: no abdominal pain, no back pain, no fever and no nausea   Dizziness Associated symptoms: chest pain, headaches, shortness of breath and vomiting   Associated symptoms: no nausea   Headache Associated symptoms: dizziness and vomiting   Associated symptoms: no abdominal pain, no back pain, no fever, no nausea, no sinus pressure and no sore throat   Shortness of Breath Associated symptoms: chest pain, headaches and vomiting   Associated symptoms: no abdominal pain, no fever and no sore throat    Asthma Associated symptoms include chest pain, headaches and shortness of breath. Pertinent negatives include no abdominal pain.       Home Medications Prior to Admission medications   Medication Sig Start Date End Date Taking? Authorizing Provider  prochlorperazine (COMPAZINE) 10 MG tablet Take 1 tablet (10 mg total) by mouth 2 (two) times daily as needed (nausea / vomiting / headache). 01/27/22  Yes Carmin Muskrat, MD  BREO ELLIPTA 200-25 MCG/INH AEPB INHALE 1 PUFF INTO LUNGS DAILY Patient taking differently: Inhale 1 puff into the lungs daily. 03/30/16   Brand Males, MD  cetirizine (ZYRTEC) 10 MG tablet Take 10 mg by mouth daily.    [provider]  clonazePAM (KLONOPIN) 2 MG tablet Take 2 mg by mouth daily as needed for anxiety.  11/21/18   [provider]  Cyanocobalamin (VITAMIN B-12 PO) Take 1 tablet by mouth at bedtime.    [provider]  cyclobenzaprine (FLEXERIL) 10 MG tablet Take 1 tablet (10 mg total) by mouth 2 (two) times daily as needed for muscle spasms. 04/16/19   Rosemarie Ax, MD  diclofenac Sodium (VOLTAREN) 1 % GEL Apply 4 g topically 4 (four) times daily. 08/05/20   Tedd Sias, PA  dicyclomine (BENTYL) 10 MG capsule Take 10 mg by mouth daily with lunch.     [provider]  Galcanezumab-gnlm 120 MG/ML SOAJ Inject 120 mLs into the skin every 30 (thirty) days. For migraine prevention    [provider]  IRON PO Take 1 tablet by mouth at bedtime.  [provider]  losartan (COZAAR) 100 MG tablet Take 1 tablet (100 mg total) by mouth daily. 11/09/21   Camnitz, Ocie Doyne, MD  nystatin (MYCOSTATIN/NYSTOP) powder Apply topically 4 (four) times daily. Patient taking differently: Apply 1 application topically 2 (two) times daily. 08/31/18   Maudie Flakes, MD  omeprazole (PRILOSEC) 20 MG capsule Take 1 capsule (20 mg total) by mouth 2 (two) times daily before a meal. 02/14/18   Norval Morton, MD   pregabalin (LYRICA) 100 MG capsule Take 100 mg by mouth 2 (two) times daily. 11/21/18   [provider]  tiZANidine (ZANAFLEX) 2 MG tablet Take 2 mg by mouth every 8 (eight) hours as needed for muscle spasms. 10/19/18   [provider]  ursodiol (ACTIGALL) 250 MG tablet Take 250 mg by mouth at bedtime as needed (stomach pain).  01/08/18   [provider]  VICTOZA 18 MG/3ML SOPN Inject 1.8 mg into the skin at bedtime.  05/22/18   [provider]      Allergies    Adhesive [tape], Aspirin, Nsaids, Penicillins, Tolmetin, Vancomycin, Oxycodone-acetaminophen, Vicodin [hydrocodone-acetaminophen], and Tramadol    Review of Systems   Review of Systems  Constitutional:  Negative for chills and fever.  HENT:  Negative for sinus pressure and sore throat.   Respiratory:  Positive for shortness of breath.   Cardiovascular:  Positive for chest pain.  Gastrointestinal:  Positive for vomiting. Negative for abdominal pain and nausea.  Genitourinary:  Negative for flank pain.  Musculoskeletal:  Negative for back pain.  Skin:  Negative for pallor and wound.  Neurological:  Positive for dizziness and headaches.  All other systems reviewed and are negative.   Physical Exam Updated Vital Signs BP 125/78 (BP Location: Right Arm)   Pulse 64   Temp 98.1 F (36.7 C) (Oral)   Resp 16   Ht 5\' 5"  (1.651 m)   Wt (!) 147.4 kg   LMP 02/13/2017   SpO2 100%   BMI 54.08 kg/m  Physical Exam Vitals and nursing note reviewed.  Constitutional:      Appearance: She is well-developed. She is obese.  HENT:     Head: Normocephalic and atraumatic.  Cardiovascular:     Rate and Rhythm: Normal rate.  Pulmonary:     Effort: Pulmonary effort is normal.     Breath sounds: Normal breath sounds.  Musculoskeletal:     Cervical back: Normal range of motion and neck supple.  Neurological:     Mental Status: She is alert.     ED Results / Procedures / Treatments   Labs (all labs  ordered are listed, but only abnormal results are displayed) Labs Reviewed  BASIC METABOLIC PANEL - Abnormal; Notable for the following components:      Result Value   Potassium 3.4 (*)    CO2 21 (*)    Glucose, Bld 104 (*)    Creatinine, Ser 1.25 (*)    Calcium 8.5 (*)    GFR, Estimated 52 (*)    All other components within normal limits  CBC - Abnormal; Notable for the following components:   WBC 3.8 (*)    All other components within normal limits  RESP PANEL BY RT-PCR (RSV, FLU A&B, COVID)  RVPGX2  TROPONIN I (HIGH SENSITIVITY)  TROPONIN I (HIGH SENSITIVITY)    EKG EKG Interpretation  Date/Time:  Wednesday January 27 2022 08:15:32 EST Ventricular Rate:  60 PR Interval:  166 QRS Duration: 86 QT Interval:  464  QTC Calculation: 464 R Axis:   24 Text Interpretation: Atrial-paced rhythm Abnormal ECG When compared with ECG of 22-Jan-2019 08:20, PREVIOUS ECG IS PRESENT when compared to prior, similar paced rhythm. No STEMI Confirmed by Theda Belfast ([id number]) on 01/27/2022 1:29:26 PM  Radiology No results found.  Procedures Procedures    Medications Ordered in ED Medications  diphenhydrAMINE (BENADRYL) injection 25 mg (25 mg Intravenous Given 01/27/22 1908)  prochlorperazine (COMPAZINE) injection 10 mg (10 mg Intravenous Given 01/27/22 1839)    ED Course/ Medical Decision Making/ A&P                           Medical Decision Making Amount and/or Complexity of Data Reviewed Labs: ordered. Radiology: ordered.  Risk Prescription drug management.   This patient presents to the ED for concern of Chest pain,dizzy, headache this involves a number of treatment options, and is a complaint that carries with it a high risk of complications and morbidity.  The differential diagnosis includes ACS, viral illness, versus CVA.    Co morbidities: Discussed in HPI   Brief History:  Patient presents to the ED with a chief complaint of dizziness, headache which began 2 days  ago, she does report a stabbing sensation under the left breast.  Prior history of CAD including sick sinus syndrome.  No alleviating or exacerbating factors.  EMR reviewed including pt PMHx, past surgical history and past visits to ER.   See HPI for more details   Lab Tests:  I ordered and independently interpreted labs.  The pertinent results include:    I personally reviewed all laboratory work and imaging. Metabolic panel without any acute abnormality specifically kidney function within normal limits and no significant electrolyte abnormalities. CBC without leukocytosis or significant anemia.  Imaging Studies:  NAD. I personally reviewed all imaging studies and no acute abnormality found. I agree with radiology interpretation.   Cardiac Monitoring:  The patient was maintained on a cardiac monitor.  I personally viewed and interpreted the cardiac monitored which showed an underlying rhythm of: NSR EKG non-ischemic   Medicines ordered:  I ordered medication including benadryl, compazine  for pain control Reevaluation of the patient after these medicines showed that the patient improved I have reviewed the patients home medicines and have made adjustments as needed   Dispostion:  Pending covid, troponin and pacemaker interrogation care signed out to Dr. Jeraldine Loots, please see his note for a full HPI.    Portions of this note were generated with Scientist, clinical (histocompatibility and immunogenetics). Dictation errors may occur despite best attempts at proofreading.   Final Clinical Impression(s) / ED Diagnoses Final diagnoses:  Bad headache  Chest pain, unspecified type    Rx / DC Orders ED Discharge Orders          Ordered    prochlorperazine (COMPAZINE) 10 MG tablet  2 times daily PRN        01/27/22 2121              Claude Manges, PA-C 02/01/22 2259    Tegeler, Canary Brim, MD 02/02/22 [phone]

## 2022-02-04 ENCOUNTER — Ambulatory Visit (HOSPITAL_COMMUNITY)
Admission: EM | Admit: 2022-02-04 | Discharge: 2022-02-04 | Disposition: A | Discharge status: Home / Self Care | Payer: Medicaid Other | Attending: Family Medicine | Admitting: Family Medicine

## 2022-02-04 ENCOUNTER — Ambulatory Visit (INDEPENDENT_AMBULATORY_CARE_PROVIDER_SITE_OTHER): Payer: Medicaid Other

## 2022-02-04 ENCOUNTER — Encounter (HOSPITAL_COMMUNITY): Payer: Self-pay

## 2022-02-04 DIAGNOSIS — R079 Chest pain, unspecified: Secondary | ICD-10-CM | POA: Diagnosis not present

## 2022-02-04 DIAGNOSIS — J4531 Mild persistent asthma with (acute) exacerbation: Secondary | ICD-10-CM

## 2022-02-04 DIAGNOSIS — R5383 Other fatigue: Secondary | ICD-10-CM

## 2022-02-04 DIAGNOSIS — I10 Essential (primary) hypertension: Secondary | ICD-10-CM | POA: Diagnosis not present

## 2022-02-04 DIAGNOSIS — R051 Acute cough: Secondary | ICD-10-CM

## 2022-02-04 LAB — POC INFLUENZA A AND B ANTIGEN (URGENT CARE ONLY)
Influenza A Ag: NEGATIVE
Influenza B Ag: NEGATIVE

## 2022-02-04 LAB — CBG MONITORING, ED: Glucose-Capillary: 81 mg/dL (ref 70–99)

## 2022-02-04 MED ORDER — ONDANSETRON 4 MG PO TBDP: 4.0000 mg | ORAL_TABLET | Freq: Three times a day (TID) | ORAL | 0 refills | Status: AC | PRN

## 2022-02-04 MED ORDER — ONDANSETRON 4 MG PO TBDP
ORAL_TABLET | ORAL | Status: AC
  Filled 2022-02-04: qty 1

## 2022-02-04 MED ORDER — PREDNISONE 20 MG PO TABS: 40.0000 mg | ORAL_TABLET | Freq: Every day | ORAL | 0 refills | Status: AC

## 2022-02-04 MED ORDER — ONDANSETRON 4 MG PO TBDP
4.0000 mg | ORAL_TABLET | Freq: Once | ORAL | Status: AC
  Administered 2022-02-04: 4 mg via ORAL

## 2022-02-04 NOTE — Discharge Instructions (Addendum)
Be aware, the prednisone prescribed will raise your blood sugars while you are taking it.  Your blood pressure was noted to be elevated during your visit today. If you are currently taking medication for high blood pressure, please ensure you are taking this as directed. If you do not have a history of high blood pressure and your blood pressure remains persistently elevated, you may need to begin taking a medication at some point. You may return here within the next few days to recheck if unable to see your primary care provider or if you do not have a one.  BP (!) 162/116 (BP Location: Left Wrist)   Pulse 79   Temp 97.9 F (36.6 C) (Oral)   Resp 16   Ht 5\' 5"  (1.651 m)   Wt (!) 149.9 kg   LMP 02/13/2017   SpO2 98%   BMI 54.98 kg/m   BP Readings from Last 3 Encounters:  02/04/22 (!) 162/116  01/27/22 125/78  08/05/20 (!) 148/106

## 2022-02-04 NOTE — ED Notes (Signed)
Lab draw attempt x2 with no success.  Dr Mannie Stabile aware.

## 2022-02-04 NOTE — ED Triage Notes (Signed)
Chief Complaint: chest pain, cough, and headaches. Was seen at [facility] [place] last Wednesday. Patient has asthma and a pacemaker. Negative Covid test.   Onset: over 1 week   Prescriptions or OTC medications tried: Yes- nausea and migraine meds, tylenol, Ricola/halls cough drops      with no relief  Sick exposure: Yes- daughter, client at work has the flu   New foods, medications, or products: No  Recent Travel: No

## 2022-02-04 NOTE — ED Provider Notes (Signed)
[place]   [id number] 02/04/22 Arrival Time: 1308  ASSESSMENT & PLAN:  1. Acute cough   2. Mild persistent asthma with acute exacerbation   3. Other fatigue   4. Elevated blood pressure reading in office with diagnosis of hypertension    I have personally viewed the imaging studies ordered this visit. No acute changes on CXR.  ECG: paced rhythm; no acute changes. No STEMI.  Suspect all asthma related, exacerbation; likely triggered by resp virus. Discussed. No resp disteress. With mild nausea; no emesis. Tolerating PO intake.  Ordered lab work but unable to draw; Therapist, sports tried x 2. Pt prefers to hold off on this now.  Begin: Meds ordered this encounter  Medications   ondansetron (ZOFRAN-ODT) disintegrating tablet 4 mg   predniSONE (DELTASONE) 20 MG tablet    Sig: Take 2 tablets (40 mg total) by mouth daily.    Dispense:  10 tablet    Refill:  0   ondansetron (ZOFRAN-ODT) 4 MG disintegrating tablet    Sig: Take 1 tablet (4 mg total) by mouth every 8 (eight) hours as needed for nausea or vomiting.    Dispense:  15 tablet    Refill:  0    Asthma precautions given. OTC symptom care as needed.  Recommend:  Follow-up Information     Schedule an appointment as soon as possible for a visit  with Lin Landsman, MD.   Specialty: Family Medicine Why: For follow up. Contact information: [place] [address] [phone]         [place].   Specialty: Emergency Medicine Why: If symptoms worsen in any way. Contact information: [address] [id number] [place] [phone]                 Discharge Instructions      Be aware, the prednisone prescribed will raise your blood sugars while you are taking it.  Your blood pressure was noted to be elevated during your visit today. If you are currently taking medication for high blood pressure, please ensure you  are taking this as directed. If you do not have a history of high blood pressure and your blood pressure remains persistently elevated, you may need to begin taking a medication at some point. You may return here within the next few days to recheck if unable to see your primary care provider or if you do not have a one.  BP (!) 162/116 (BP Location: Left Wrist)   Pulse 79   Temp 97.9 F (36.6 C) (Oral)   Resp 16   Ht 5\' 5"  (1.651 m)   Wt (!) 149.9 kg   LMP 02/13/2017   SpO2 98%   BMI 54.98 kg/m   BP Readings from Last 3 Encounters:  02/04/22 (!) 162/116  01/27/22 125/78  08/05/20 (!) 148/106       Reviewed expectations re: course of current medical issues. Questions answered. Outlined signs and symptoms indicating need for more acute intervention. Patient verbalized understanding. After Visit Summary given.  SUBJECTIVE: History from: patient.  Melanie Hess is a 52 y.o. female who presents with complaint of cough and chest tightness; x few days; seen recently in [facility] for chest symptoms. Afebrile. Home alb neb treatments with some relief. Ambulatory here.  Increased blood pressure noted today. Reports that she is treated for HTN. No medication use today.  She reports no swelling of ankles, no orthostatic dizziness or lightheadedness, no  orthopnea or paroxysmal nocturnal dyspnea, and no palpitations.   Social History   Tobacco Use  Smoking Status Never  Smokeless Tobacco Never  Tobacco Comments   years ago may have smoked 1 cig or less a week     OBJECTIVE:  Vitals:   02/04/22 1752  BP: (!) 162/116  Pulse: 79  Resp: 16  Temp: 97.9 F (36.6 C)  TempSrc: Oral  SpO2: 98%  Weight: (!) 149.9 kg  Height: 5\' 5"  (1.651 m)    BP noted  General appearance: alert; NAD but appears fatigued HEENT: [place]; AT; with nasal congestion Neck: supple without LAD Cv: RRR without murmer Lungs: unlabored respirations, moderate bilateral expiratory wheezing; cough: mild;  no significant respiratory distress Skin: warm and dry Psychological: alert and cooperative; normal mood and affect  Imaging: DG Chest 2 View  Result Date: 02/04/2022 CLINICAL DATA:  CP EXAM: CHEST - 2 VIEW COMPARISON:  January 27, 2022, February 13, 2018 FINDINGS: The cardiomediastinal silhouette is unchanged in contour.LEFT chest cardiac pacing device. No pleural effusion. No pneumothorax. No acute pleuroparenchymal abnormality. Visualized abdomen is unremarkable. Multilevel degenerative changes of the thoracic spine. IMPRESSION: No acute cardiopulmonary abnormality. Electronically Signed   By: Valentino Saxon M.D.   On: 02/04/2022 18:09    Allergies  Allergen Reactions   Adhesive [Tape] Itching and Rash   Aspirin Shortness Of Breath and Swelling   Nsaids Shortness Of Breath and Swelling   Penicillins Anaphylaxis, Swelling, Rash and Other (See Comments)    PCN reaction causing immediate rash, facial/tongue/throat swelling, SOB or lightheadedness with hypotension: YES PCN reaction causing severe rash involving mucus membranes or skin necrosis: YES PCN reaction that required hospitalization YES PCN reaction occurring within the last 10 years: NO    Tolmetin Shortness Of Breath and Swelling   Vancomycin Hives, Itching and Rash   Oxycodone-Acetaminophen Hives and Itching   Vicodin [Hydrocodone-Acetaminophen] Hives and Itching   Tramadol Nausea Only    Past Medical History:  Diagnosis Date   Ankle fracture, right 2001   Anxiety    Arthritis    Asthma    Carpal tunnel syndrome    Bilateral   Diabetes mellitus without complication (HCC)    Type II   GERD (gastroesophageal reflux disease)    Hernia, abdominal    Hypertension    Migraine    OSA (obstructive sleep apnea)    Plantar fasciitis of right foot    Pneumonia    hx   Shingles 11/2015   Shortness of breath dyspnea    with walking short distances   Family History  Problem Relation Age of Onset   Hypertension  Mother    Pulmonary embolism Mother    Depression Mother    Breast cancer Maternal Aunt    Brain cancer Cousin    CAD Maternal Grandmother    Microcephaly Maternal Grandmother    Heart attack Maternal Grandmother    Social History   Socioeconomic History   Marital status: Widowed    Spouse name: Not on file   Number of children: 2   Years of education: Not on file   Highest education level: Not on file  Occupational History   Occupation: Chemical engineer  Tobacco Use   Smoking status: Never   Smokeless tobacco: Never   Tobacco comments:    years ago may have smoked 1 cig or less a week  Vaping Use   Vaping Use: Never used  Substance and Sexual Activity   Alcohol use: No  Alcohol/week: 0.0 standard drinks of alcohol   Drug use: No   Sexual activity: Not on file  Other Topics Concern   Not on file  Social History Narrative   Not on file   Social Determinants of Health   Financial Resource Strain: Low Risk  (06/22/2017)   Overall Financial Resource Strain (CARDIA)    Difficulty of Paying Living Expenses: Not hard at all  Food Insecurity: No Food Insecurity (06/22/2017)   Hunger Vital Sign    Worried About Running Out of Food in the Last Year: Never true    Ran Out of Food in the Last Year: Never true  Transportation Needs: No Transportation Needs (06/22/2017)   PRAPARE - Administrator, Civil Service (Medical): No    Lack of Transportation (Non-Medical): No  Physical Activity: Sufficiently Active (06/22/2017)   Exercise Vital Sign    Days of Exercise per Week: 5 days    Minutes of Exercise per Session: 30 min  Stress: Stress Concern Present (06/22/2017)   [employer] of Occupational Health - Occupational Stress Questionnaire    Feeling of Stress : Very much  Social Connections: Somewhat Isolated (06/22/2017)   Social Connection and Isolation Panel [NHANES]    Frequency of Communication with Friends and Family: More than three times a week    Frequency of  Social Gatherings with Friends and Family: Once a week    Attends Religious Services: More than 4 times per year    Active Member of [employer] or Organizations: No    Attends Banker Meetings: Never    Marital Status: Widowed  Intimate Partner Violence: Not At Risk (06/22/2017)   Humiliation, Afraid, Rape, and Kick questionnaire    Fear of Current or Ex-Partner: No    Emotionally Abused: No    Physically Abused: No    Sexually Abused: No             Mardella Layman, MD 02/04/22 [phone]

## 2022-03-16 ENCOUNTER — Ambulatory Visit: Payer: Medicaid Other

## 2022-03-16 DIAGNOSIS — I495 Sick sinus syndrome: Secondary | ICD-10-CM | POA: Diagnosis not present

## 2022-03-17 LAB — CUP PACEART REMOTE DEVICE CHECK
Battery Remaining Longevity: 80 mo
Battery Remaining Percentage: 71 %
Battery Voltage: 3.01 V
Brady Statistic AP VP Percent: 2.4 %
Brady Statistic AP VS Percent: 51 %
Brady Statistic AS VP Percent: 1 %
Brady Statistic AS VS Percent: 46 %
Brady Statistic RA Percent Paced: 53 %
Brady Statistic RV Percent Paced: 2.6 %
Date Time Interrogation Session: 20240227040014
Implantable Lead Connection Status: 753985
Implantable Lead Connection Status: 753985
Implantable Lead Implant Date: 20200824
Implantable Lead Implant Date: 20200824
Implantable Lead Location: 753859
Implantable Lead Location: 753860
Implantable Pulse Generator Implant Date: 20200824
Lead Channel Impedance Value: 410 Ohm
Lead Channel Impedance Value: 490 Ohm
Lead Channel Pacing Threshold Amplitude: 0.5 V
Lead Channel Pacing Threshold Amplitude: 1 V
Lead Channel Pacing Threshold Pulse Width: 0.5 ms
Lead Channel Pacing Threshold Pulse Width: 0.5 ms
Lead Channel Sensing Intrinsic Amplitude: 2.3 mV
Lead Channel Sensing Intrinsic Amplitude: 3.7 mV
Lead Channel Setting Pacing Amplitude: 2 V
Lead Channel Setting Pacing Amplitude: 2.5 V
Lead Channel Setting Pacing Pulse Width: 0.5 ms
Lead Channel Setting Sensing Sensitivity: 0.7 mV
Pulse Gen Model: 2272
Pulse Gen Serial Number: 9150511

## 2022-04-21 NOTE — Progress Notes (Signed)
Remote pacemaker transmission.   

## 2022-06-15 ENCOUNTER — Ambulatory Visit (INDEPENDENT_AMBULATORY_CARE_PROVIDER_SITE_OTHER): Payer: Medicaid Other

## 2022-06-15 DIAGNOSIS — I495 Sick sinus syndrome: Secondary | ICD-10-CM

## 2022-06-16 LAB — CUP PACEART REMOTE DEVICE CHECK
Battery Remaining Longevity: 77 mo
Battery Remaining Percentage: 69 %
Battery Voltage: 3.01 V
Brady Statistic AP VP Percent: 2.3 %
Brady Statistic AP VS Percent: 51 %
Brady Statistic AS VP Percent: 1 %
Brady Statistic AS VS Percent: 45 %
Brady Statistic RA Percent Paced: 53 %
Brady Statistic RV Percent Paced: 2.5 %
Date Time Interrogation Session: 20240528040016
Implantable Lead Connection Status: 753985
Implantable Lead Connection Status: 753985
Implantable Lead Implant Date: 20200824
Implantable Lead Implant Date: 20200824
Implantable Lead Location: 753859
Implantable Lead Location: 753860
Implantable Pulse Generator Implant Date: 20200824
Lead Channel Impedance Value: 390 Ohm
Lead Channel Impedance Value: 510 Ohm
Lead Channel Pacing Threshold Amplitude: 0.5 V
Lead Channel Pacing Threshold Amplitude: 1 V
Lead Channel Pacing Threshold Pulse Width: 0.5 ms
Lead Channel Pacing Threshold Pulse Width: 0.5 ms
Lead Channel Sensing Intrinsic Amplitude: 3 mV
Lead Channel Sensing Intrinsic Amplitude: 4.9 mV
Lead Channel Setting Pacing Amplitude: 2 V
Lead Channel Setting Pacing Amplitude: 2.5 V
Lead Channel Setting Pacing Pulse Width: 0.5 ms
Lead Channel Setting Sensing Sensitivity: 0.7 mV
Pulse Gen Model: 2272
Pulse Gen Serial Number: 9150511

## 2022-06-22 ENCOUNTER — Emergency Department (HOSPITAL_COMMUNITY): Payer: Medicaid Other

## 2022-06-22 ENCOUNTER — Other Ambulatory Visit: Payer: Self-pay

## 2022-06-22 ENCOUNTER — Emergency Department (HOSPITAL_COMMUNITY)
Admission: EM | Admit: 2022-06-22 | Discharge: 2022-06-23 | Disposition: A | Discharge status: Home / Self Care | Payer: Medicaid Other | Attending: Emergency Medicine | Admitting: Emergency Medicine

## 2022-06-22 ENCOUNTER — Encounter (HOSPITAL_COMMUNITY): Payer: Self-pay | Admitting: Emergency Medicine

## 2022-06-22 DIAGNOSIS — R42 Dizziness and giddiness: Secondary | ICD-10-CM | POA: Diagnosis not present

## 2022-06-22 DIAGNOSIS — E119 Type 2 diabetes mellitus without complications: Secondary | ICD-10-CM | POA: Diagnosis not present

## 2022-06-22 DIAGNOSIS — I10 Essential (primary) hypertension: Secondary | ICD-10-CM | POA: Diagnosis not present

## 2022-06-22 DIAGNOSIS — R55 Syncope and collapse: Secondary | ICD-10-CM | POA: Diagnosis present

## 2022-06-22 DIAGNOSIS — R072 Precordial pain: Secondary | ICD-10-CM | POA: Insufficient documentation

## 2022-06-22 DIAGNOSIS — Z950 Presence of cardiac pacemaker: Secondary | ICD-10-CM | POA: Diagnosis not present

## 2022-06-22 DIAGNOSIS — Z79899 Other long term (current) drug therapy: Secondary | ICD-10-CM | POA: Insufficient documentation

## 2022-06-22 LAB — CBC
HCT: 37.1 % (ref 36.0–46.0)
Hemoglobin: 12.1 g/dL (ref 12.0–15.0)
MCH: 28.9 pg (ref 26.0–34.0)
MCHC: 32.6 g/dL (ref 30.0–36.0)
MCV: 88.8 fL (ref 80.0–100.0)
Platelets: 220 10*3/uL (ref 150–400)
RBC: 4.18 MIL/uL (ref 3.87–5.11)
RDW: 15.6 % — ABNORMAL HIGH (ref 11.5–15.5)
WBC: 5.4 10*3/uL (ref 4.0–10.5)
nRBC: 0 % (ref 0.0–0.2)

## 2022-06-22 LAB — BASIC METABOLIC PANEL
Anion gap: 11 (ref 5–15)
BUN: 15 mg/dL (ref 6–20)
CO2: 25 mmol/L (ref 22–32)
Calcium: 9.1 mg/dL (ref 8.9–10.3)
Chloride: 106 mmol/L (ref 98–111)
Creatinine, Ser: 1.29 mg/dL — ABNORMAL HIGH (ref 0.44–1.00)
GFR, Estimated: 49 mL/min — ABNORMAL LOW (ref >60)
Glucose, Bld: 82 mg/dL (ref 70–99)
Potassium: 3.6 mmol/L (ref 3.5–5.1)
Sodium: 142 mmol/L (ref 135–145)

## 2022-06-22 LAB — TROPONIN I (HIGH SENSITIVITY): Troponin I (High Sensitivity): 6 ng/L (ref <18)

## 2022-06-22 NOTE — ED Triage Notes (Signed)
Pt in from work at [employer], states she was standing and helping a customer and suddenly felt dizzy and nauseous. Pt states she went to go sit in the office but her symptoms did not improve, then developed "pulling chest pain" to L chest. Pt does state the area is tender to chest. Upon further talking, pt states she is sore from a syncopal fall at work 6 days ago, states she fell on her back. Pt does have St Jude atrial pacemaker VS en route: 139/90 CBG 106

## 2022-06-22 NOTE — ED Notes (Signed)
Portable EKG performed at this time, given to Dr. Freida Busman, not crossing over in pt's room

## 2022-06-22 NOTE — ED Provider Notes (Signed)
[place] EMERGENCY DEPARTMENT AT [facility] Provider Note   CSN: [id number] Arrival date & time: 06/22/22  2135     History {Add pertinent medical, surgical, social history, OB history to HPI:1} Chief Complaint  Patient presents with   Chest Pain   Dizziness    Melanie Hess is a 52 y.o. female.  HPI   Patient with medical history including hypertension, diabetes, sick sinus status post pacemaker, sleep apnea, presenting with complaints of near syncope.  Patient states that she was at work today where she stands all day, states that she felt hungry but did not want to eat very much upon eating a single bite of a candy bar she felt lightheaded and had to sit down.  She states that then she had some left-sided chest pain which she describes as pulling, she did not actually pass out, she does endorse some shortness of breath but this is chronic and unchanged since she got her pacemaker.  She states that she has been having this left-sided chest pulling for the last couple months has remained unchanged, has intermittent, she has not seen anyone for this.  She note that its worsened with movement primarily lifting heavy things Or moving her left arm.  She denies any paresthesia moving down her left arm.  She states that she currently has no complaints at this time, she notes that prior to her feeling near syncopal she had not eaten anything all day and had been standing for a long period of time. she states that her blood sugar has been running low in the 70s, she is unclear if the low blood sugars correlated with the near syncopal episodes.  She states that her near syncope is typically after standing for long peers of time, is not exacerbated with going from sitting to standing.  States that She has no history of PEs or DVTs currently not on oral birth control no recent surgeries no long immobilizations.    Home Medications Prior to Admission medications   Medication Sig  Start Date End Date Taking? Authorizing Provider  BREO ELLIPTA 200-25 MCG/INH AEPB INHALE 1 PUFF INTO LUNGS DAILY Patient taking differently: Inhale 1 puff into the lungs daily. 03/30/16   Kalman Shan, MD  cetirizine (ZYRTEC) 10 MG tablet Take 10 mg by mouth daily.    [provider]  clonazePAM (KLONOPIN) 2 MG tablet Take 2 mg by mouth daily as needed for anxiety.  11/21/18   [provider]  Cyanocobalamin (VITAMIN B-12 PO) Take 1 tablet by mouth at bedtime.    [provider]  cyclobenzaprine (FLEXERIL) 10 MG tablet Take 1 tablet (10 mg total) by mouth 2 (two) times daily as needed for muscle spasms. 04/16/19   Myra Rude, MD  diclofenac Sodium (VOLTAREN) 1 % GEL Apply 4 g topically 4 (four) times daily. 08/05/20   Gailen Shelter, PA  dicyclomine (BENTYL) 10 MG capsule Take 10 mg by mouth daily with lunch.     [provider]  Galcanezumab-gnlm 120 MG/ML SOAJ Inject 120 mLs into the skin every 30 (thirty) days. For migraine prevention    [provider]  IRON PO Take 1 tablet by mouth at bedtime.    [provider]  losartan (COZAAR) 100 MG tablet Take 1 tablet (100 mg total) by mouth daily. 11/09/21   Camnitz, Andree Coss, MD  nystatin (MYCOSTATIN/NYSTOP) powder Apply topically 4 (four) times daily. Patient taking differently: Apply 1 application  topically 2 (two)  times daily. 08/31/18   Sabas Sous, MD  omeprazole (PRILOSEC) 20 MG capsule Take 1 capsule (20 mg total) by mouth 2 (two) times daily before a meal. 02/14/18   Clydie Braun, MD  ondansetron (ZOFRAN-ODT) 4 MG disintegrating tablet Take 1 tablet (4 mg total) by mouth every 8 (eight) hours as needed for nausea or vomiting. 02/04/22   Mardella Layman, MD  predniSONE (DELTASONE) 20 MG tablet Take 2 tablets (40 mg total) by mouth daily. 02/04/22   Mardella Layman, MD  pregabalin (LYRICA) 100 MG capsule Take 100 mg by mouth 2 (two) times daily. 11/21/18   [provider]  prochlorperazine (COMPAZINE) 10 MG tablet Take 1 tablet (10 mg total) by mouth 2 (two) times daily as needed (nausea / vomiting / headache). 01/27/22   Gerhard Munch, MD  tiZANidine (ZANAFLEX) 2 MG tablet Take 2 mg by mouth every 8 (eight) hours as needed for muscle spasms. 10/19/18   [provider]  ursodiol (ACTIGALL) 250 MG tablet Take 250 mg by mouth at bedtime as needed (stomach pain).  01/08/18   [provider]  VICTOZA 18 MG/3ML SOPN Inject 1.8 mg into the skin at bedtime.  05/22/18   [provider]      Allergies    Adhesive [tape], Aspirin, Nsaids, Penicillins, Tolmetin, Vancomycin, Oxycodone-acetaminophen, Vicodin [hydrocodone-acetaminophen], and Tramadol    Review of Systems   Review of Systems  Constitutional:  Negative for chills and fever.  Respiratory:  Negative for shortness of breath.   Cardiovascular:  Positive for chest pain.  Gastrointestinal:  Negative for abdominal pain.  Neurological:  Positive for light-headedness. Negative for headaches.    Physical Exam Updated Vital Signs BP (!) 154/99   Pulse 60   Resp 11   Wt (!) 145.2 kg   LMP 02/13/2017   SpO2 100%   BMI 53.25 kg/m  Physical Exam Vitals and nursing note reviewed.  Constitutional:      General: She is not in acute distress.    Appearance: She is not ill-appearing.  HENT:     Head: Normocephalic and atraumatic.     Nose: No congestion.  Eyes:     Conjunctiva/sclera: Conjunctivae normal.  Cardiovascular:     Rate and Rhythm: Normal rate and regular rhythm.     Pulses: Normal pulses.     Heart sounds: No murmur heard.    No friction rub. No gallop.     Comments: Has noted pacemaker scar on the left upper chest, no overlying skin changes, no fluctuant induration, area was nontender to palpation, but just below the pacemaker area was tender pain was focalized and easily reproducible, was also worsened with left arm movements. Pulmonary:     Effort: No respiratory  distress.     Breath sounds: No wheezing, rhonchi or rales.  Musculoskeletal:     Right lower leg: No edema.     Left lower leg: No edema.     Comments: No unilateral leg swelling no calf tenderness no palpable cords.  Skin:    General: Skin is warm and dry.  Neurological:     Mental Status: She is alert.  Psychiatric:        Mood and Affect: Mood normal.     ED Results / Procedures / Treatments   Labs (all labs ordered are listed, but only abnormal results are displayed) Labs Reviewed  BASIC METABOLIC PANEL - Abnormal; Notable for the following components:      Result Value  Creatinine, Ser 1.29 (*)    GFR, Estimated 49 (*)    All other components within normal limits  CBC - Abnormal; Notable for the following components:   RDW 15.6 (*)    All other components within normal limits  TROPONIN I (HIGH SENSITIVITY)  TROPONIN I (HIGH SENSITIVITY)    EKG None  Radiology DG Chest 2 View  Result Date: 06/22/2022 CLINICAL DATA:  Chest pain.  Dizziness and nausea.  Sudden onset. EXAM: CHEST - 2 VIEW COMPARISON:  02/04/2022 FINDINGS: Cardiac pacemaker. Normal heart size and pulmonary vascularity. No focal airspace disease or consolidation in the lungs. No blunting of costophrenic angles. No pneumothorax. Mediastinal contours appear intact. IMPRESSION: No active cardiopulmonary disease. Electronically Signed   By: Burman Nieves M.D.   On: 06/22/2022 22:44    Procedures Procedures  {Document cardiac monitor, telemetry assessment procedure when appropriate:1}  Medications Ordered in ED Medications - No data to display  ED Course/ Medical Decision Making/ A&P   {   Click here for ABCD2, HEART and other calculatorsREFRESH Note before signing :1}                          Medical Decision Making Amount and/or Complexity of Data Reviewed Labs: ordered. Radiology: ordered.   This patient presents to the ED for concern of near syncope, this involves an extensive number of  treatment options, and is a complaint that carries with it a high risk of complications and morbidity.  The differential diagnosis includes ACS, PE, orthostatic hypotension, arrhythmia electrolyte abnormality    Additional history obtained:  Additional history obtained from family at bedside External records from outside source obtained and reviewed including cardiology notes   Co morbidities that complicate the patient evaluation  Status post pacemaker  Social Determinants of Health:  N/A    Lab Tests:  I Ordered, and personally interpreted labs.  The pertinent results include: CBC unremarkable, BMP reveals creatinine 129 GFR 49, first troponin is 6   Imaging Studies ordered:  I ordered imaging studies including chest x-ray I independently visualized and interpreted imaging which showed unremarkable I agree with the radiologist interpretation   Cardiac Monitoring:  The patient was maintained on a cardiac monitor.  I personally viewed and interpreted the cardiac monitored which showed an underlying rhythm of: ***   Medicines ordered and prescription drug management:  I ordered medication including n/a I have reviewed the patients home medicines and have made adjustments as needed  Critical Interventions:  N/a   Reevaluation:  Presents after concerns of near syncope, she currently has no complaints at this time, triage obtain basic lab workup which I personally reviewed unremarkable, will interrogate pacemaker, obtain orthostatics and reassess.   Consultations Obtained:  I requested consultation with the ***,  and discussed lab and imaging findings as well as pertinent plan - they recommend: ***    Test Considered:  ***    Rule out ****    Dispostion and problem list  After consideration of the diagnostic results and the patients response to treatment, I feel that the patent would benefit from ***.       {Document critical care time when  appropriate:1} {Document review of labs and clinical decision tools ie heart score, Chads2Vasc2 etc:1}  {Document your independent review of radiology images, and any outside records:1} {Document your discussion with family members, caretakers, and with consultants:1} {Document social determinants of health affecting pt's care:1} {Document your decision making why  or why not admission, treatments were needed:1} Final Clinical Impression(s) / ED Diagnoses Final diagnoses:  None    Rx / DC Orders ED Discharge Orders     None

## 2022-06-23 LAB — TROPONIN I (HIGH SENSITIVITY): Troponin I (High Sensitivity): 5 ng/L (ref <18)

## 2022-06-23 MED ORDER — LIDOCAINE 4 % EX PTCH: 1.0000 | MEDICATED_PATCH | CUTANEOUS | 0 refills | Status: AC

## 2022-06-23 MED ORDER — ACETAMINOPHEN 325 MG PO TABS
650.0000 mg | ORAL_TABLET | Freq: Once | ORAL | Status: DC
  Filled 2022-06-23: qty 2

## 2022-06-23 MED ORDER — CYCLOBENZAPRINE HCL 10 MG PO TABS
5.0000 mg | ORAL_TABLET | Freq: Once | ORAL | Status: AC
  Administered 2022-06-23: 5 mg via ORAL
  Filled 2022-06-23: qty 1

## 2022-06-23 MED ORDER — LIDOCAINE 5 % EX PTCH
1.0000 | MEDICATED_PATCH | CUTANEOUS | Status: DC
  Administered 2022-06-23: 1 via TRANSDERMAL
  Filled 2022-06-23: qty 1

## 2022-06-23 NOTE — Discharge Instructions (Addendum)
Lab workup and imaging are reassuring I given you a lidocaine patch please use as prescribed.  To help with your lightheadedness I do recommend that staying hydrated and please eat frequent snacks throughout the day to help prevent bottoming out your blood sugar.  I would encourage you to follow-up with your primary doctor as you do need reassessment with your heart monitor.  He may also follow-up with your primary doctor for further assessment of your lightheadedness.  Come back to the emergency department if you develop chest pain, shortness of breath, severe abdominal pain, uncontrolled nausea, vomiting, diarrhea.

## 2022-06-28 ENCOUNTER — Other Ambulatory Visit: Payer: Self-pay | Admitting: Cardiology

## 2022-07-08 NOTE — Progress Notes (Signed)
Remote pacemaker transmission.   

## 2022-09-14 ENCOUNTER — Ambulatory Visit (INDEPENDENT_AMBULATORY_CARE_PROVIDER_SITE_OTHER): Payer: Medicaid Other

## 2022-09-14 DIAGNOSIS — I495 Sick sinus syndrome: Secondary | ICD-10-CM | POA: Diagnosis not present

## 2022-09-14 LAB — CUP PACEART REMOTE DEVICE CHECK
Battery Remaining Longevity: 76 mo
Battery Remaining Percentage: 67 %
Battery Voltage: 3.01 V
Brady Statistic AP VP Percent: 2.2 %
Brady Statistic AP VS Percent: 52 %
Brady Statistic AS VP Percent: 1 %
Brady Statistic AS VS Percent: 45 %
Brady Statistic RA Percent Paced: 54 %
Brady Statistic RV Percent Paced: 2.4 %
Date Time Interrogation Session: 20240827040017
Implantable Lead Connection Status: 753985
Implantable Lead Connection Status: 753985
Implantable Lead Implant Date: 20200824
Implantable Lead Implant Date: 20200824
Implantable Lead Location: 753859
Implantable Lead Location: 753860
Implantable Pulse Generator Implant Date: 20200824
Lead Channel Impedance Value: 430 Ohm
Lead Channel Impedance Value: 530 Ohm
Lead Channel Pacing Threshold Amplitude: 0.5 V
Lead Channel Pacing Threshold Amplitude: 1 V
Lead Channel Pacing Threshold Pulse Width: 0.5 ms
Lead Channel Pacing Threshold Pulse Width: 0.5 ms
Lead Channel Sensing Intrinsic Amplitude: 3.6 mV
Lead Channel Sensing Intrinsic Amplitude: 5 mV
Lead Channel Setting Pacing Amplitude: 2 V
Lead Channel Setting Pacing Amplitude: 2.5 V
Lead Channel Setting Pacing Pulse Width: 0.5 ms
Lead Channel Setting Sensing Sensitivity: 0.7 mV
Pulse Gen Model: 2272
Pulse Gen Serial Number: 9150511

## 2022-09-27 NOTE — Progress Notes (Signed)
Remote pacemaker transmission.   

## 2022-12-07 ENCOUNTER — Ambulatory Visit: Payer: Medicaid Other | Attending: Cardiology | Admitting: Cardiology

## 2022-12-07 ENCOUNTER — Encounter: Payer: Self-pay | Admitting: Cardiology

## 2022-12-07 VITALS — BP 134/84 | HR 71 | Ht 65.0 in | Wt 332.0 lb

## 2022-12-07 DIAGNOSIS — I495 Sick sinus syndrome: Secondary | ICD-10-CM

## 2022-12-07 DIAGNOSIS — I10 Essential (primary) hypertension: Secondary | ICD-10-CM

## 2022-12-07 DIAGNOSIS — G4733 Obstructive sleep apnea (adult) (pediatric): Secondary | ICD-10-CM | POA: Diagnosis not present

## 2022-12-07 NOTE — Progress Notes (Signed)
  Electrophysiology Office Note:   Date:  12/07/2022  ID:  Elliett Wadhwani Hess, DOB [birth year], MRN [id number]  Primary Cardiologist: Chrystie Nose, MD Electrophysiologist: Regan Lemming, MD      History of Present Illness:   Melanie Hess is a 52 y.o. female with h/o hypertension, diabetes, sleep apnea, morbid obesity, sick sinus syndrome seen today for routine electrophysiology followup.   Since last being seen in our clinic the patient reports doing overall well from a cardiac perspective.  She has no chest pain or shortness of breath.  No further episodes of near syncope.  She has been fatigued.  She Joeline Freer talk to her sleep doctor about adjusting her CPAP.  she denies chest pain, palpitations, dyspnea, PND, orthopnea, nausea, vomiting, dizziness, syncope, edema, weight gain, or early satiety.   Review of systems complete and found to be negative unless listed in HPI.      EP Information / Studies Reviewed:    EKG is ordered today. Personal review as below.  EKG Interpretation Date/Time:  Tuesday December 07 2022 16:11:37 EST Ventricular Rate:  71 PR Interval:  146 QRS Duration:  78 QT Interval:  414 QTC Calculation: 449 R Axis:   78  Text Interpretation: Normal sinus rhythm Normal ECG When compared with ECG of 22-Jun-2022 21:49, Sinus rhythm has replaced Electronic atrial pacemaker Confirmed by Masashi Snowdon ([id number]) on 12/07/2022 4:17:57 PM   PPM Interrogation-  reviewed in detail today,  See PACEART report.  Device History: Abbott Dual Chamber PPM implanted 09/07/18 for Sinus Node Dysfunction  Risk Assessment/Calculations:              Physical Exam:   VS:  BP 134/84 (BP Location: Left Arm, Patient Position: Sitting, Cuff Size: Large)   Pulse 71   Ht 5\' 5"  (1.651 m)   Wt (!) 332 lb (150.6 kg)   LMP 02/13/2017   SpO2 96%   BMI 55.25 kg/m    Wt Readings from Last 3 Encounters:  12/07/22 (!) 332 lb (150.6 kg)  06/22/22 (!) 320 lb (145.2 kg)   02/04/22 (!) 330 lb 6.4 oz (149.9 kg)     GEN: Well nourished, well developed in no acute distress NECK: No JVD; No carotid bruits CARDIAC: Regular rate and rhythm, no murmurs, rubs, gallops RESPIRATORY:  Clear to auscultation without rales, wheezing or rhonchi  ABDOMEN: Soft, non-tender, non-distended EXTREMITIES:  No edema; No deformity   ASSESSMENT AND PLAN:    SND s/p Abbott PPM  Normal PPM function See Pace Art report No changes today  2.  Obstructive sleep apnea: CPAP compliance encouraged  3.  Hypertension: Currently well-controlled  Disposition:   Follow up with EP APP in 12 months  Signed, Ica Daye Jorja Loa, MD

## 2022-12-14 ENCOUNTER — Ambulatory Visit: Payer: Medicaid Other

## 2023-02-02 ENCOUNTER — Emergency Department (HOSPITAL_COMMUNITY): Payer: Medicaid Other

## 2023-02-02 ENCOUNTER — Emergency Department (HOSPITAL_COMMUNITY)
Admission: EM | Admit: 2023-02-02 | Discharge: 2023-02-03 | Disposition: A | Discharge status: Home / Self Care | Payer: Medicaid Other | Attending: Emergency Medicine | Admitting: Emergency Medicine

## 2023-02-02 ENCOUNTER — Other Ambulatory Visit: Payer: Self-pay

## 2023-02-02 ENCOUNTER — Encounter (HOSPITAL_COMMUNITY): Payer: Self-pay | Admitting: *Deleted

## 2023-02-02 ENCOUNTER — Ambulatory Visit
Admission: EM | Admit: 2023-02-02 | Discharge: 2023-02-02 | Disposition: A | Discharge status: Home / Self Care | Payer: Medicaid Other

## 2023-02-02 DIAGNOSIS — R531 Weakness: Secondary | ICD-10-CM | POA: Insufficient documentation

## 2023-02-02 DIAGNOSIS — E119 Type 2 diabetes mellitus without complications: Secondary | ICD-10-CM | POA: Diagnosis not present

## 2023-02-02 DIAGNOSIS — R350 Frequency of micturition: Secondary | ICD-10-CM | POA: Insufficient documentation

## 2023-02-02 DIAGNOSIS — I10 Essential (primary) hypertension: Secondary | ICD-10-CM | POA: Diagnosis not present

## 2023-02-02 DIAGNOSIS — E162 Hypoglycemia, unspecified: Secondary | ICD-10-CM | POA: Diagnosis not present

## 2023-02-02 DIAGNOSIS — R42 Dizziness and giddiness: Secondary | ICD-10-CM | POA: Insufficient documentation

## 2023-02-02 DIAGNOSIS — J45909 Unspecified asthma, uncomplicated: Secondary | ICD-10-CM | POA: Diagnosis not present

## 2023-02-02 DIAGNOSIS — Z20822 Contact with and (suspected) exposure to covid-19: Secondary | ICD-10-CM | POA: Diagnosis not present

## 2023-02-02 DIAGNOSIS — T82111A Breakdown (mechanical) of cardiac pulse generator (battery), initial encounter: Secondary | ICD-10-CM | POA: Diagnosis not present

## 2023-02-02 DIAGNOSIS — Z950 Presence of cardiac pacemaker: Secondary | ICD-10-CM | POA: Diagnosis not present

## 2023-02-02 DIAGNOSIS — R5383 Other fatigue: Secondary | ICD-10-CM | POA: Diagnosis not present

## 2023-02-02 DIAGNOSIS — R079 Chest pain, unspecified: Secondary | ICD-10-CM | POA: Insufficient documentation

## 2023-02-02 HISTORY — DX: Presence of cardiac pacemaker: Z95.0

## 2023-02-02 HISTORY — DX: Anemia, unspecified: D64.9

## 2023-02-02 LAB — BASIC METABOLIC PANEL
Anion gap: 5 (ref 5–15)
BUN: 13 mg/dL (ref 6–20)
CO2: 27 mmol/L (ref 22–32)
Calcium: 8.9 mg/dL (ref 8.9–10.3)
Chloride: 106 mmol/L (ref 98–111)
Creatinine, Ser: 1.02 mg/dL — ABNORMAL HIGH (ref 0.44–1.00)
GFR, Estimated: >60 mL/min (ref >60)
Glucose, Bld: 88 mg/dL (ref 70–99)
Potassium: 3.9 mmol/L (ref 3.5–5.1)
Sodium: 138 mmol/L (ref 135–145)

## 2023-02-02 LAB — CBC
HCT: 39.9 % (ref 36.0–46.0)
Hemoglobin: 12.8 g/dL (ref 12.0–15.0)
MCH: 28.4 pg (ref 26.0–34.0)
MCHC: 32.1 g/dL (ref 30.0–36.0)
MCV: 88.7 fL (ref 80.0–100.0)
Platelets: 226 10*3/uL (ref 150–400)
RBC: 4.5 MIL/uL (ref 3.87–5.11)
RDW: 15.3 % (ref 11.5–15.5)
WBC: 5.5 10*3/uL (ref 4.0–10.5)
nRBC: 0 % (ref 0.0–0.2)

## 2023-02-02 LAB — CBG MONITORING, ED: Glucose-Capillary: 88 mg/dL (ref 70–99)

## 2023-02-02 LAB — TROPONIN I (HIGH SENSITIVITY): Troponin I (High Sensitivity): 5 ng/L (ref <18)

## 2023-02-02 NOTE — ED Triage Notes (Signed)
 C/o headache, sugar levels keep dropping lately, keep urinating, chest tight (pacemaker keeps going off), feeling tired, runny nose. Has been having headaches for a week. Pacemaker going off for weeks. Has not taken otc medications.

## 2023-02-02 NOTE — ED Provider Notes (Signed)
[facility] Emergency Department Provider Note MRN:  [id number]  Arrival date & time: 02/03/23     Chief Complaint   Chest Pain   History of Present Illness   Melanie Hess is a 53 y.o. year-old female with a history of diabetes, pacemaker presenting to the ED with chief complaint of chest pain.  Headaches persistent and more frequent than normal for the past week.  Low energy, frequent urination, weakness, lightheadedness especially when standing, nausea, occasional chest discomfort but not currently.  No shortness of breath.  No leg pain or swelling, no fever, no cough or cold-like symptoms.  Review of Systems  A thorough review of systems was obtained and all systems are negative except as noted in the HPI and PMH.   Patient's Health History    Past Medical History:  Diagnosis Date   Anemia    Ankle fracture, right 2001   Anxiety    Arthritis    Asthma    Carpal tunnel syndrome    Bilateral   Diabetes mellitus without complication (HCC)    Type II   GERD (gastroesophageal reflux disease)    Hernia, abdominal    Hypertension    Migraine    OSA (obstructive sleep apnea)    Pacemaker    Plantar fasciitis of right foot    Pneumonia    hx   Shingles 11/2015   Shortness of breath dyspnea    with walking short distances    Past Surgical History:  Procedure Laterality Date   CARPAL TUNNEL RELEASE Left    GASTRIC BYPASS     HEEL SPUR RESECTION Right 08/01/2015   Procedure: HEEL SPUR EXCISIONS;  Surgeon: Marcene Corning, MD;  Location: [facility] OR;  Service: Orthopedics;  Laterality: Right;   HEEL SPUR RESECTION Left 01/27/2016   Procedure: LEFT HEEL SPUR EXCISION AND TENDON ACHILLIES REPAIR;  Surgeon: Marcene Corning, MD;  Location: [facility] OR;  Service: Orthopedics;  Laterality: Left;  PRONE POSITION   LAPAROSCOPIC GASTRIC SLEEVE RESECTION     PACEMAKER IMPLANT N/A 09/11/2018   Procedure: PACEMAKER IMPLANT;  Surgeon: Regan Lemming, MD;   Location[facility] INVASIVE CV LAB;  Service: Cardiovascular;  Laterality: N/A;   TUBAL LIGATION      Family History  Problem Relation Age of Onset   Hypertension Mother    Pulmonary embolism Mother    Depression Mother    Breast cancer Maternal Aunt    Brain cancer Cousin    CAD Maternal Grandmother    Microcephaly Maternal Grandmother    Heart attack Maternal Grandmother     Social History   Socioeconomic History   Marital status: Widowed    Spouse name: Not on file   Number of children: 2   Years of education: Not on file   Highest education level: Not on file  Occupational History   Occupation: Building surveyor  Tobacco Use   Smoking status: Never   Smokeless tobacco: Never   Tobacco comments:    years ago may have smoked 1 cig or less a week  Vaping Use   Vaping status: Never Used  Substance and Sexual Activity   Alcohol use: Yes    Comment: occasional   Drug use: No   Sexual activity: Not on file  Other Topics Concern   Not on file  Social History Narrative   Not on file   Social Drivers of Health   Financial Resource Strain: Low Risk  (06/22/2017)   Overall Physicist, medical Strain (  CARDIA)    Difficulty of Paying Living Expenses: Not hard at all  Food Insecurity: No Food Insecurity (06/22/2017)   Hunger Vital Sign    Worried About Running Out of Food in the Last Year: Never true    Ran Out of Food in the Last Year: Never true  Transportation Needs: No Transportation Needs (06/22/2017)   PRAPARE - Administrator, Civil Service (Medical): No    Lack of Transportation (Non-Medical): No  Physical Activity: Sufficiently Active (06/22/2017)   Exercise Vital Sign    Days of Exercise per Week: 5 days    Minutes of Exercise per Session: 30 min  Stress: Stress Concern Present (06/22/2017)   [employer] of Occupational Health - Occupational Stress Questionnaire    Feeling of Stress : Very much  Social Connections: Somewhat Isolated (06/22/2017)   Social  Connection and Isolation Panel [NHANES]    Frequency of Communication with Friends and Family: More than three times a week    Frequency of Social Gatherings with Friends and Family: Once a week    Attends Religious Services: More than 4 times per year    Active Member of [employer] or Organizations: No    Attends Banker Meetings: Never    Marital Status: Widowed  Intimate Partner Violence: Not At Risk (06/22/2017)   Humiliation, Afraid, Rape, and Kick questionnaire    Fear of Current or Ex-Partner: No    Emotionally Abused: No    Physically Abused: No    Sexually Abused: No     Physical Exam   Vitals:   02/03/23 0130 02/03/23 0407  BP: 134/84   Pulse: 66   Resp: 12   Temp:  98.2 F (36.8 C)  SpO2: 100%     CONSTITUTIONAL: Well-appearing, NAD NEURO/PSYCH:  Alert and oriented x 3, no focal deficits EYES:  eyes equal and reactive ENT/NECK:  no LAD, no JVD CARDIO: Regular rate, well-perfused, normal S1 and S2 PULM:  CTAB no wheezing or rhonchi GI/GU:  non-distended, non-tender MSK/SPINE:  No gross deformities, no edema SKIN:  no rash, atraumatic   *Additional and/or pertinent findings included in MDM below  Diagnostic and Interventional Summary    EKG Interpretation Date/Time:  Wednesday February 02 2023 19:11:17 EST Ventricular Rate:  60 PR Interval:  174 QRS Duration:  84 QT Interval:  440 QTC Calculation: 440 R Axis:   21  Text Interpretation: Atrial-paced rhythm with occasional ventricular-paced complexes Abnormal ECG When compared with ECG of 07-Dec-2022 16:11, PREVIOUS ECG IS PRESENT Confirmed by Kennis Carina [phone]) on 02/02/2023 11:06:34 PM       Labs Reviewed  BASIC METABOLIC PANEL - Abnormal; Notable for the following components:      Result Value   Creatinine, Ser 1.02 (*)    All other components within normal limits  URINALYSIS, ROUTINE W REFLEX MICROSCOPIC - Abnormal; Notable for the following components:   APPearance HAZY (*)     Leukocytes,Ua TRACE (*)    Bacteria, UA RARE (*)    All other components within normal limits  RESP PANEL BY RT-PCR (RSV, FLU A&B, COVID)  RVPGX2  CBC  MAGNESIUM  CBG MONITORING, ED  TROPONIN I (HIGH SENSITIVITY)  TROPONIN I (HIGH SENSITIVITY)    CT HEAD WO CONTRAST ( )  Final Result    DG Chest 2 View  Final Result      Medications  acetaminophen (TYLENOL) tablet 1,000 mg (1,000 mg Oral Given 02/03/23 0407)     Procedures  /  Critical Care Procedures  ED Course and Medical Decision Making  Initial Impression and Ddx Differential diagnosis includes viral illness, UTI, electrolyte disturbance, hyperglycemia, ACS, pneumonia, arrhythmia.  Doubt PE.  Past medical/surgical history that increases complexity of ED encounter: Pacemaker  Interpretation of Diagnostics I personally reviewed the EKG and my interpretation is as follows: Paced rhythm  Labs reassuring with no significant blood count or electrolyte disturbance  Patient Reassessment and Ultimate Disposition/Management     Interrogation of the [facility] pacemaker reveals no abnormalities.  Patient continues to look well with normal vital signs during her emergency department visit, no signs of emergent process, reassuring workup, appropriate for discharge.  Patient management required discussion with the following services or consulting groups:  None  Complexity of Problems Addressed Acute illness or injury that poses threat of life of bodily function  Additional Data Reviewed and Analyzed Further history obtained from: Prior labs/imaging results  Additional Factors Impacting ED Encounter Risk Consideration of hospitalization  Elmer Sow. Pilar Plate, MD [facility] Emergency Medicine [facility] [email or web address]  Final Clinical Impressions(s) / ED Diagnoses     ICD-10-CM   1. Dizzy  R42     2. Frequent urination  R35.0     3. Low energy  R53.83     4. Weakness  R53.1       ED Discharge  Orders     None        Discharge Instructions Discussed with and Provided to Patient:     Discharge Instructions      You were evaluated in the Emergency Department and after careful evaluation, we did not find any emergent condition requiring admission or further testing in the hospital.  Your exam/testing today is overall reassuring.  Recommend follow-up with your regular doctors to discuss your symptoms.  Your pacemaker seems to be functioning normally.  Please return to the Emergency Department if you experience any worsening of your condition.   Thank you for allowing [place] to be a part of your care.       Sabas Sous, MD 02/03/23 [id number]

## 2023-02-02 NOTE — Discharge Instructions (Addendum)
 Advised patient to go to [facility] ED now for further evaluation of malfunction of cardiac pacemaker.

## 2023-02-02 NOTE — ED Notes (Signed)
 The  pt  is a very hard stick needs iv team if iv needed

## 2023-02-02 NOTE — ED Notes (Signed)
 Patient not in room at this time.

## 2023-02-02 NOTE — ED Provider Notes (Signed)
 [facility]    CSN: [id number] Arrival date & time: 02/02/23  1622      History   Chief Complaint No chief complaint on file.   HPI Melanie Hess is a 53 y.o. female.   HPI 53 year old female presents with headache, elevated sugar levels and keep urinating.  PMH significant for T2DM without complication, HTN, and migraine.  Past Medical History:  Diagnosis Date   Anemia    Ankle fracture, right 2001   Anxiety    Arthritis    Asthma    Carpal tunnel syndrome    Bilateral   Diabetes mellitus without complication (HCC)    Type II   GERD (gastroesophageal reflux disease)    Hernia, abdominal    Hypertension    Migraine    OSA (obstructive sleep apnea)    Pacemaker    Plantar fasciitis of right foot    Pneumonia    hx   Shingles 11/2015   Shortness of breath dyspnea    with walking short distances    Patient Active Problem List   Diagnosis Date Noted   Hypokalemia 02/14/2018   Syncope 02/13/2018   Nausea & vomiting 08/22/2017   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   Morbid obesity with BMI of 50.0-59.9, adult (HCC) 08/09/2017   Esophageal reflux 03/10/2017   Preoperative clearance 08/12/2016   Chest pain 04/07/2016   LVH (left ventricular hypertrophy) 04/07/2016   Hepatic steatosis 04/07/2016   Diabetes mellitus with complication (HCC)    Moderate persistent asthma 12/09/2015   OSA on CPAP 10/23/2015   Pain of right heel 08/01/2015   Chronic pain    Anemia 03/24/2014   Diabetes mellitus type 2, controlled, without complications (HCC) 03/24/2014   Asthma exacerbation 03/24/2014   Hypertension 02/28/2014   Morbid obesity (HCC) 02/28/2014   DOE (dyspnea on exertion) 02/28/2014   Dyspnea and respiratory abnormality 02/14/2014    Past Surgical History:  Procedure Laterality Date   CARPAL TUNNEL RELEASE Left    GASTRIC BYPASS     HEEL SPUR RESECTION Right 08/01/2015   Procedure: HEEL SPUR EXCISIONS;  Surgeon: Dayne Even,  MD;  Location: [facility] OR;  Service: Orthopedics;  Laterality: Right;   HEEL SPUR RESECTION Left 01/27/2016   Procedure: LEFT HEEL SPUR EXCISION AND TENDON ACHILLIES REPAIR;  Surgeon: Dayne Even, MD;  [facility];  Service: Orthopedics;  Laterality: Left;  PRONE POSITION   LAPAROSCOPIC GASTRIC SLEEVE RESECTION     PACEMAKER IMPLANT N/A 09/11/2018   Procedure: PACEMAKER IMPLANT;  Surgeon: Lei Pump, MD;  Location[facility] INVASIVE CV LAB;  Service: Cardiovascular;  Laterality: N/A;   TUBAL LIGATION      OB History   No obstetric history on file.      Home Medications    Prior to Admission medications   Medication Sig Start Date End Date Taking? Authorizing Provider  albuterol  (VENTOLIN  HFA) 108 (90 Base) MCG/ACT inhaler Inhale into the lungs every 6 (six) hours as needed for wheezing or shortness of breath.   Yes [provider]  BREO ELLIPTA  200-25 MCG/INH AEPB INHALE 1 PUFF INTO LUNGS DAILY Patient taking differently: Inhale 1 puff into the lungs daily. 03/30/16   Maire Scot, MD  cetirizine (ZYRTEC) 10 MG tablet Take 10 mg by mouth daily.    [provider]  clonazePAM  (KLONOPIN ) 2 MG tablet Take 2 mg by mouth daily as needed for anxiety.  11/21/18   [provider]  Cyanocobalamin (VITAMIN B-12 PO) Take 1  tablet by mouth at bedtime.    [provider]  cyclobenzaprine  (FLEXERIL ) 10 MG tablet Take 1 tablet (10 mg total) by mouth 2 (two) times daily as needed for muscle spasms. 04/16/19   Margaree Shark, MD  diclofenac  Sodium (VOLTAREN ) 1 % GEL Apply 4 g topically 4 (four) times daily. 08/05/20   Coretta Dexter, PA  dicyclomine (BENTYL) 10 MG capsule Take 10 mg by mouth daily with lunch.     [provider]  Galcanezumab-gnlm 120 MG/ML SOAJ Inject 120 mLs into the skin every 30 (thirty) days. For migraine prevention    [provider]  IRON PO Take 1 tablet by mouth at bedtime.    [provider]  losartan   (COZAAR ) 100 MG tablet TAKE 1 TABLET(100 MG) BY MOUTH DAILY 06/29/22   Camnitz, Babetta Lesch, MD  nystatin  (MYCOSTATIN /NYSTOP ) powder Apply topically 4 (four) times daily. Patient taking differently: Apply 1 application  topically 2 (two) times daily. 08/31/18   Edson Graces, MD  omeprazole  (PRILOSEC) 20 MG capsule Take 1 capsule (20 mg total) by mouth 2 (two) times daily before a meal. 02/14/18   Lena Qualia, MD  ondansetron  (ZOFRAN -ODT) 4 MG disintegrating tablet Take 1 tablet (4 mg total) by mouth every 8 (eight) hours as needed for nausea or vomiting. 02/04/22   Afton Albright, MD  predniSONE  (DELTASONE ) 20 MG tablet Take 2 tablets (40 mg total) by mouth daily. 02/04/22   Afton Albright, MD  pregabalin  (LYRICA ) 100 MG capsule Take 100 mg by mouth 2 (two) times daily. 11/21/18   [provider]  prochlorperazine  (COMPAZINE ) 10 MG tablet Take 1 tablet (10 mg total) by mouth 2 (two) times daily as needed (nausea / vomiting / headache). 01/27/22   Dorenda Gandy, MD  tiZANidine (ZANAFLEX) 2 MG tablet Take 2 mg by mouth every 8 (eight) hours as needed for muscle spasms. 10/19/18   [provider]  ursodiol (ACTIGALL) 250 MG tablet Take 250 mg by mouth at bedtime as needed (stomach pain).  01/08/18   [provider]  VICTOZA  18 MG/3ML SOPN Inject 1.8 mg into the skin at bedtime.  05/22/18   [provider]    Family History Family History  Problem Relation Age of Onset   Hypertension Mother    Pulmonary embolism Mother    Depression Mother    Breast cancer Maternal Aunt    Brain cancer Cousin    CAD Maternal Grandmother    Microcephaly Maternal Grandmother    Heart attack Maternal Grandmother     Social History Social History   Tobacco Use   Smoking status: Never   Smokeless tobacco: Never   Tobacco comments:    years ago may have smoked 1 cig or less a week  Vaping Use   Vaping status: Never Used  Substance Use Topics   Alcohol use: Yes     Comment: occasional   Drug use: No     Allergies   Adhesive [tape], Aspirin , Nsaids, Penicillins, Tolmetin, Vancomycin , Oxycodone -acetaminophen , Vicodin [hydrocodone-acetaminophen ], and Tramadol    Review of Systems Review of Systems   Physical Exam Triage Vital Signs ED Triage Vitals  Encounter Vitals Group     BP 02/02/23 1706 (!) 168/108     Systolic BP Percentile --      Diastolic BP Percentile --      Pulse Rate 02/02/23 1706 84     Resp 02/02/23 1706 16     Temp 02/02/23 1706 97.6 F (36.4  C)     Temp src --      SpO2 02/02/23 1706 99 %     Weight --      Height --      Head Circumference --      Peak Flow --      Pain Score 02/02/23 1710 0     Pain Loc --      Pain Education --      Exclude from Growth Chart --    No data found.  Updated Vital Signs BP (!) 168/108   Pulse 84   Temp 97.6 F (36.4 C)   Resp 16   LMP 02/13/2017   SpO2 99%   Visual Acuity Right Eye Distance:   Left Eye Distance:   Bilateral Distance:    Right Eye Near:   Left Eye Near:    Bilateral Near:     Physical Exam Vitals and nursing note reviewed.  Constitutional:      Appearance: Normal appearance. She is obese. She is ill-appearing.  HENT:     Head: Normocephalic and atraumatic.     Right Ear: Tympanic membrane, ear canal and external ear normal.     Left Ear: Tympanic membrane, ear canal and external ear normal.     Mouth/Throat:     Mouth: Mucous membranes are moist.     Pharynx: Oropharynx is clear.  Eyes:     Extraocular Movements: Extraocular movements intact.     Conjunctiva/sclera: Conjunctivae normal.     Pupils: Pupils are equal, round, and reactive to light.  Cardiovascular:     Rate and Rhythm: Normal rate and regular rhythm.     Pulses: Normal pulses.     Heart sounds: Normal heart sounds.  Pulmonary:     Effort: Pulmonary effort is normal.     Breath sounds: Normal breath sounds. No wheezing, rhonchi or rales.  Musculoskeletal:        General:  Normal range of motion.     Cervical back: Normal range of motion and neck supple.  Skin:    General: Skin is warm and dry.  Neurological:     General: No focal deficit present.     Mental Status: She is alert and oriented to person, place, and time. Mental status is at baseline.  Psychiatric:        Mood and Affect: Mood normal.        Behavior: Behavior normal.      UC Treatments / Results  Labs (all labs ordered are listed, but only abnormal results are displayed) Labs Reviewed - No data to display  EKG   Radiology No results found.  Procedures Procedures (including critical care time)  Medications Ordered in UC Medications - No data to display  Initial Impression / Assessment and Plan / UC Course  I have reviewed the triage vital signs and the nursing notes.  Pertinent labs & imaging results that were available during my care of the patient were reviewed by me and considered in my medical decision making (see chart for details).     MDM: 1.  Malfunction of cardiac pacemaker, initial encounter-Advised patient to go to [facility] ED now for further evaluation of malfunction of cardiac pacemaker.  Patient agreed and verbalized understanding of these instructions and this man of care today.  Patient discharged to local ED, hemodynamically stable. Final Clinical Impressions(s) / UC Diagnoses   Final diagnoses:  Malfunction of cardiac pacemaker, initial encounter  Hypoglycemia  Urinary frequency     Discharge Instructions      Advised patient to go to [facility] ED now for further evaluation of malfunction of cardiac pacemaker.     ED Prescriptions   None    PDMP not reviewed this encounter.   Leonides Ramp, FNP 02/02/23 1901

## 2023-02-02 NOTE — ED Provider Triage Note (Signed)
Emergency Medicine Provider Triage Evaluation Note  Melanie Hess , a 53 y.o. female  was evaluated in triage.  Pt complains of chest pressure.Hx of pace maker placement. C/o chest pressure.  Review of Systems  Positive: Chest pressure Negative: fever  Physical Exam  BP (!) 166/101   Pulse 60   Temp 97.9 F (36.6 C)   Resp 20   Ht 5\' 5"  (1.651 m)   Wt (!) 150.6 kg   LMP 02/13/2017   SpO2 98%   BMI 55.25 kg/m  Gen:   Awake, no distress   Resp:  Normal effort  MSK:   Moves extremities without difficulty  Other:  Chest pressure  Medical Decision Making  Medically screening exam initiated at 7:44 PM.  Appropriate orders placed.  Mahira A Hess was informed that the remainder of the evaluation will be completed by another provider, this initial triage assessment does not replace that evaluation, and the importance of remaining in the ED until their evaluation is complete.     Arthor Captain, PA-C 02/02/23 1945

## 2023-02-02 NOTE — ED Notes (Signed)
 Patient is being discharged from the Urgent Care and sent to the Emergency Department via pov . Per ragan, np , patient is in need of higher level of care due to pacemaker going off. Patient is aware and verbalizes understanding of plan of care.  Vitals:   02/02/23 1706  BP: (!) 168/108  Pulse: 84  Resp: 16  Temp: 97.6 F (36.4 C)  SpO2: 99%

## 2023-02-02 NOTE — ED Triage Notes (Signed)
 The pt has multiple symptoms for 1-2 weeks c/o weakness  headache she has a pacemaker that has been alarming dizziness  nausea   no known temp    lmp none

## 2023-02-03 LAB — RESP PANEL BY RT-PCR (RSV, FLU A&B, COVID)  RVPGX2
Influenza A by PCR: NEGATIVE
Influenza B by PCR: NEGATIVE
Resp Syncytial Virus by PCR: NEGATIVE
SARS Coronavirus 2 by RT PCR: NEGATIVE

## 2023-02-03 LAB — URINALYSIS, ROUTINE W REFLEX MICROSCOPIC
Bilirubin Urine: NEGATIVE
Glucose, UA: NEGATIVE mg/dL
Hgb urine dipstick: NEGATIVE
Ketones, ur: NEGATIVE mg/dL
Nitrite: NEGATIVE
Protein, ur: NEGATIVE mg/dL
Specific Gravity, Urine: 1.028 (ref 1.005–1.030)
pH: 5 (ref 5.0–8.0)

## 2023-02-03 LAB — TROPONIN I (HIGH SENSITIVITY): Troponin I (High Sensitivity): 6 ng/L (ref <18)

## 2023-02-03 LAB — MAGNESIUM: Magnesium: 2 mg/dL (ref 1.7–2.4)

## 2023-02-03 MED ORDER — ACETAMINOPHEN 500 MG PO TABS
1000.0000 mg | ORAL_TABLET | Freq: Once | ORAL | Status: AC
  Administered 2023-02-03: 1000 mg via ORAL
  Filled 2023-02-03: qty 2

## 2023-02-03 NOTE — Discharge Instructions (Signed)
You were evaluated in the Emergency Department and after careful evaluation, we did not find any emergent condition requiring admission or further testing in the hospital.  Your exam/testing today is overall reassuring.  Recommend follow-up with your regular doctors to discuss your symptoms.  Your pacemaker seems to be functioning normally.  Please return to the Emergency Department if you experience any worsening of your condition.   Thank you for allowing [place] to be a part of your care.

## 2023-03-15 ENCOUNTER — Ambulatory Visit: Payer: Medicaid Other

## 2023-03-22 LAB — CUP PACEART REMOTE DEVICE CHECK
Battery Remaining Longevity: 72 mo
Battery Remaining Percentage: 64 %
Battery Voltage: 3.01 V
Brady Statistic AP VP Percent: 2.2 %
Brady Statistic AP VS Percent: 53 %
Brady Statistic AS VP Percent: 1 %
Brady Statistic AS VS Percent: 45 %
Brady Statistic RA Percent Paced: 54 %
Brady Statistic RV Percent Paced: 2.4 %
Date Time Interrogation Session: 20241126223405
Implantable Lead Connection Status: 753985
Implantable Lead Connection Status: 753985
Implantable Lead Implant Date: 20200824
Implantable Lead Implant Date: 20200824
Implantable Lead Location: 753859
Implantable Lead Location: 753860
Implantable Pulse Generator Implant Date: 20200824
Lead Channel Impedance Value: 390 Ohm
Lead Channel Impedance Value: 490 Ohm
Lead Channel Pacing Threshold Amplitude: 0.5 V
Lead Channel Pacing Threshold Amplitude: 0.75 V
Lead Channel Pacing Threshold Pulse Width: 0.5 ms
Lead Channel Pacing Threshold Pulse Width: 0.5 ms
Lead Channel Sensing Intrinsic Amplitude: 3.6 mV
Lead Channel Sensing Intrinsic Amplitude: 4.6 mV
Lead Channel Setting Pacing Amplitude: 2 V
Lead Channel Setting Pacing Amplitude: 2.5 V
Lead Channel Setting Pacing Pulse Width: 0.5 ms
Lead Channel Setting Sensing Sensitivity: 0.7 mV
Pulse Gen Model: 2272
Pulse Gen Serial Number: 9150511

## 2023-06-14 ENCOUNTER — Ambulatory Visit: Payer: Medicaid Other

## 2023-08-18 ENCOUNTER — Other Ambulatory Visit: Payer: Self-pay

## 2023-08-18 ENCOUNTER — Emergency Department (HOSPITAL_COMMUNITY): Payer: Self-pay

## 2023-08-18 ENCOUNTER — Encounter (HOSPITAL_COMMUNITY): Payer: Self-pay

## 2023-08-18 ENCOUNTER — Emergency Department (HOSPITAL_COMMUNITY)
Admission: EM | Admit: 2023-08-18 | Discharge: 2023-08-19 | Discharge status: Against Medical Advice | Payer: Self-pay | Attending: Emergency Medicine | Admitting: Emergency Medicine

## 2023-08-18 DIAGNOSIS — E11649 Type 2 diabetes mellitus with hypoglycemia without coma: Secondary | ICD-10-CM | POA: Insufficient documentation

## 2023-08-18 DIAGNOSIS — E119 Type 2 diabetes mellitus without complications: Secondary | ICD-10-CM | POA: Insufficient documentation

## 2023-08-18 DIAGNOSIS — J45909 Unspecified asthma, uncomplicated: Secondary | ICD-10-CM | POA: Insufficient documentation

## 2023-08-18 DIAGNOSIS — E162 Hypoglycemia, unspecified: Secondary | ICD-10-CM

## 2023-08-18 DIAGNOSIS — I10 Essential (primary) hypertension: Secondary | ICD-10-CM | POA: Insufficient documentation

## 2023-08-18 DIAGNOSIS — Z950 Presence of cardiac pacemaker: Secondary | ICD-10-CM | POA: Insufficient documentation

## 2023-08-18 LAB — URINALYSIS, ROUTINE W REFLEX MICROSCOPIC
Bilirubin Urine: NEGATIVE
Glucose, UA: NEGATIVE mg/dL
Hgb urine dipstick: NEGATIVE
Ketones, ur: NEGATIVE mg/dL
Leukocytes,Ua: NEGATIVE
Nitrite: NEGATIVE
Protein, ur: NEGATIVE mg/dL
Specific Gravity, Urine: 1.018 (ref 1.005–1.030)
pH: 5 (ref 5.0–8.0)

## 2023-08-18 LAB — COMPREHENSIVE METABOLIC PANEL WITH GFR
ALT: 15 U/L (ref 0–44)
AST: 17 U/L (ref 15–41)
Albumin: 3.4 g/dL — ABNORMAL LOW (ref 3.5–5.0)
Alkaline Phosphatase: 62 U/L (ref 38–126)
Anion gap: 7 (ref 5–15)
BUN: 8 mg/dL (ref 6–20)
CO2: 20 mmol/L — ABNORMAL LOW (ref 22–32)
Calcium: 7.8 mg/dL — ABNORMAL LOW (ref 8.9–10.3)
Chloride: 113 mmol/L — ABNORMAL HIGH (ref 98–111)
Creatinine, Ser: 1.06 mg/dL — ABNORMAL HIGH (ref 0.44–1.00)
GFR, Estimated: >60 mL/min (ref >60)
Glucose, Bld: 79 mg/dL (ref 70–99)
Potassium: 3.3 mmol/L — ABNORMAL LOW (ref 3.5–5.1)
Sodium: 140 mmol/L (ref 135–145)
Total Bilirubin: 0.3 mg/dL (ref 0.0–1.2)
Total Protein: 6.6 g/dL (ref 6.5–8.1)

## 2023-08-18 LAB — CBC WITH DIFFERENTIAL/PLATELET
Abs Immature Granulocytes: 0 K/uL (ref 0.00–0.07)
Basophils Absolute: 0 K/uL (ref 0.0–0.1)
Basophils Relative: 0 %
Eosinophils Absolute: 0.1 K/uL (ref 0.0–0.5)
Eosinophils Relative: 3 %
HCT: 35.4 % — ABNORMAL LOW (ref 36.0–46.0)
Hemoglobin: 11.3 g/dL — ABNORMAL LOW (ref 12.0–15.0)
Immature Granulocytes: 0 %
Lymphocytes Relative: 45 %
Lymphs Abs: 2 K/uL (ref 0.7–4.0)
MCH: 28.8 pg (ref 26.0–34.0)
MCHC: 31.9 g/dL (ref 30.0–36.0)
MCV: 90.1 fL (ref 80.0–100.0)
Monocytes Absolute: 0.3 K/uL (ref 0.1–1.0)
Monocytes Relative: 6 %
Neutro Abs: 2.1 K/uL (ref 1.7–7.7)
Neutrophils Relative %: 46 %
Platelets: 211 K/uL (ref 150–400)
RBC: 3.93 MIL/uL (ref 3.87–5.11)
RDW: 16.2 % — ABNORMAL HIGH (ref 11.5–15.5)
WBC: 4.5 K/uL (ref 4.0–10.5)
nRBC: 0 % (ref 0.0–0.2)

## 2023-08-18 LAB — CBG MONITORING, ED: Glucose-Capillary: 86 mg/dL (ref 70–99)

## 2023-08-18 MED ORDER — METOCLOPRAMIDE HCL 5 MG/ML IJ SOLN
10.0000 mg | Freq: Once | INTRAMUSCULAR | Status: AC
  Administered 2023-08-18: 10 mg via INTRAVENOUS
  Filled 2023-08-18: qty 2

## 2023-08-18 MED ORDER — LACTATED RINGERS IV BOLUS
1000.0000 mL | Freq: Once | INTRAVENOUS | Status: AC
  Administered 2023-08-18: 1000 mL via INTRAVENOUS

## 2023-08-18 MED ORDER — ACETAMINOPHEN 325 MG PO TABS
650.0000 mg | ORAL_TABLET | Freq: Once | ORAL | Status: AC
  Administered 2023-08-18: 650 mg via ORAL
  Filled 2023-08-18: qty 2

## 2023-08-18 MED ORDER — POTASSIUM CHLORIDE CRYS ER 20 MEQ PO TBCR
40.0000 meq | EXTENDED_RELEASE_TABLET | Freq: Once | ORAL | Status: AC
  Administered 2023-08-18: 40 meq via ORAL
  Filled 2023-08-18: qty 2
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
## 2023-08-18 NOTE — Discharge Instructions (Signed)
 You were seen today for fatigue. While you were here we monitored your vitals, preformed a physical exam, and labs and CT. These were all reassuring and there is no indication for any further testing or intervention in the emergency department at this time.  It is likely her symptoms are secondary to low blood sugar  Things to do:  - Follow up with your primary care provider within the next 1-2 weeks - Check your blood sugar frequently and take sugar and insulin  as needed  Return to the emergency department if you have any new or worsening symptoms including headaches not responsive to Tylenol  or Motrin , fevers, nausea vomiting, or if you have any other concerns.

## 2023-08-18 NOTE — ED Notes (Signed)
Pacemaker successfully interrogated  

## 2023-08-19 NOTE — ED Provider Notes (Signed)
  12:18 AM Care assumed from prior team.  We are awaiting pacemaker interrogation.  This has not yet been faxed to us .  Patient expressed that she wanted to leave.  Prior team did feel this was pertinent given nature of her symptoms.  She is aware this is what we are waiting for, however states her ride is here and needs to leave.  She appears at baseline currently, capable of making medical decisions.  She is aware there may be undetectable abnormalities without this information.  She will sign out AMA.  Encouraged to follow-up with PCP.  Can return here for new concerns.   Jarold Olam HERO, PA-C 08/19/23 0103    Haze Lonni PARAS, MD 08/19/23 [phone]

## 2023-08-19 NOTE — ED Notes (Signed)
 Pt requested to leave AMA. Updated provider and patient verbalized understanding risks of leaving AMA.

## 2023-09-13 ENCOUNTER — Ambulatory Visit: Payer: Medicaid Other

## 2023-12-13 ENCOUNTER — Ambulatory Visit: Payer: Medicaid Other

## 2024-01-23 ENCOUNTER — Ambulatory Visit: Payer: Self-pay | Attending: Student | Admitting: Student

## 2024-01-23 NOTE — Progress Notes (Deleted)
" °  Electrophysiology Office Note:   ID:  Melanie Hess, DOB 07/16/1970, MRN [id number]  Primary Cardiologist: Vinie JAYSON Maxcy, MD Electrophysiologist: Soyla Gladis Norton, MD  {Click to update primary MD,subspecialty MD or APP then REFRESH:1}    History of Present Illness:   Melanie Hess is a 54 y.o. female with h/o HTN, DM2, OSA, obesity, and SSS s/p PPM seen today for routine electrophysiology followup.   Since last being seen in our clinic the patient reports doing ***.  she denies chest pain, palpitations, dyspnea, PND, orthopnea, nausea, vomiting, dizziness, syncope, edema, weight gain, or early satiety.   Review of systems complete and found to be negative unless listed in HPI.   EP Information / Studies Reviewed:    EKG is ordered today. Personal review as below.       PPM Interrogation-  reviewed in detail today,  See PACEART report.  Arrhythmia/Device History Abbott Dual Chamber PPM 08/2018 for SSS/symptomatic brady   Physical Exam:   VS:  LMP 02/13/2017    Wt Readings from Last 3 Encounters:  02/02/23 (!) 332 lb 0.2 oz (150.6 kg)  12/07/22 (!) 332 lb (150.6 kg)  06/22/22 (!) 320 lb (145.2 kg)     GEN: No acute distress  NECK: No JVD; No carotid bruits CARDIAC: {EPRHYTHM:28826}, no murmurs, rubs, gallops RESPIRATORY:  Clear to auscultation without rales, wheezing or rhonchi  ABDOMEN: Soft, non-tender, non-distended EXTREMITIES:  {EDEMA LEVEL:28147::No} edema; No deformity   ASSESSMENT AND PLAN:    Sick sinus syndrome s/p Abbott PPM  Normal PPM function See Pace Art report No changes today  HTN Stable on current regimen   OSA  Encouraged nightly CPAP   Obesity There is no height or weight on file to calculate BMI.  Encouraged lifestyle modification  {Click here to Review PMH, Prob List, Meds, Allergies, SHx, FHx  :1}   Disposition:   Follow up with {EPPROVIDERS:28135::EP Team} {EPFOLLOW UP:28173}  Signed, Ozell Prentice Passey,  PA-C  "

## 2024-01-24 ENCOUNTER — Encounter: Payer: Self-pay | Admitting: Student

## 2024-03-13 ENCOUNTER — Ambulatory Visit: Payer: Medicaid Other
